# Patient Record
Sex: Female | Born: 1945
Health system: Southern US, Community
[De-identification: ages and names within clinical notes are randomized; demographics above are authoritative.]

## PROBLEM LIST (undated history)

## (undated) DIAGNOSIS — N814 Uterovaginal prolapse, unspecified: Secondary | ICD-10-CM

## (undated) DIAGNOSIS — R011 Cardiac murmur, unspecified: Secondary | ICD-10-CM

## (undated) DIAGNOSIS — E039 Hypothyroidism, unspecified: Secondary | ICD-10-CM

## (undated) DIAGNOSIS — IMO0002 Reserved for concepts with insufficient information to code with codable children: Secondary | ICD-10-CM

## (undated) DIAGNOSIS — J439 Emphysema, unspecified: Secondary | ICD-10-CM

## (undated) DIAGNOSIS — N189 Chronic kidney disease, unspecified: Secondary | ICD-10-CM

## (undated) DIAGNOSIS — I10 Essential (primary) hypertension: Secondary | ICD-10-CM

## (undated) DIAGNOSIS — F329 Major depressive disorder, single episode, unspecified: Secondary | ICD-10-CM

## (undated) DIAGNOSIS — R569 Unspecified convulsions: Secondary | ICD-10-CM

## (undated) DIAGNOSIS — R002 Palpitations: Secondary | ICD-10-CM

## (undated) DIAGNOSIS — D649 Anemia, unspecified: Secondary | ICD-10-CM

## (undated) DIAGNOSIS — Z8679 Personal history of other diseases of the circulatory system: Secondary | ICD-10-CM

## (undated) DIAGNOSIS — K219 Gastro-esophageal reflux disease without esophagitis: Secondary | ICD-10-CM

## (undated) DIAGNOSIS — E785 Hyperlipidemia, unspecified: Secondary | ICD-10-CM

## (undated) DIAGNOSIS — F419 Anxiety disorder, unspecified: Secondary | ICD-10-CM

## (undated) DIAGNOSIS — I219 Acute myocardial infarction, unspecified: Secondary | ICD-10-CM

## (undated) DIAGNOSIS — R51 Headache: Secondary | ICD-10-CM

## (undated) DIAGNOSIS — I2699 Other pulmonary embolism without acute cor pulmonale: Secondary | ICD-10-CM

## (undated) DIAGNOSIS — Z5189 Encounter for other specified aftercare: Secondary | ICD-10-CM

## (undated) DIAGNOSIS — F0390 Unspecified dementia without behavioral disturbance: Secondary | ICD-10-CM

## (undated) DIAGNOSIS — G8929 Other chronic pain: Secondary | ICD-10-CM

## (undated) DIAGNOSIS — F32A Depression, unspecified: Secondary | ICD-10-CM

## (undated) DIAGNOSIS — M1990 Unspecified osteoarthritis, unspecified site: Secondary | ICD-10-CM

## (undated) HISTORY — DX: Unspecified osteoarthritis, unspecified site: M19.90

## (undated) HISTORY — DX: Major depressive disorder, single episode, unspecified: F32.9

## (undated) HISTORY — DX: Anemia, unspecified: D64.9

## (undated) HISTORY — DX: Personal history of other diseases of the circulatory system: Z86.79

## (undated) HISTORY — DX: Other pulmonary embolism without acute cor pulmonale: I26.99

## (undated) HISTORY — DX: Hyperlipidemia, unspecified: E78.5

## (undated) HISTORY — PX: OTHER SURGICAL HISTORY: SHX169

## (undated) HISTORY — DX: Uterovaginal prolapse, unspecified: N81.4

## (undated) HISTORY — PX: TUBAL LIGATION: SHX77

## (undated) HISTORY — DX: Emphysema, unspecified: J43.9

## (undated) HISTORY — DX: Palpitations: R00.2

## (undated) HISTORY — DX: Gastro-esophageal reflux disease without esophagitis: K21.9

## (undated) HISTORY — DX: Cardiac murmur, unspecified: R01.1

## (undated) HISTORY — DX: Depression, unspecified: F32.A

## (undated) HISTORY — DX: Acute myocardial infarction, unspecified: I21.9

## (undated) HISTORY — DX: Anxiety disorder, unspecified: F41.9

## (undated) HISTORY — DX: Hypothyroidism, unspecified: E03.9

## (undated) HISTORY — DX: Reserved for concepts with insufficient information to code with codable children: IMO0002

## (undated) HISTORY — DX: Encounter for other specified aftercare: Z51.89

---

## 2000-10-01 ENCOUNTER — Emergency Department (HOSPITAL_COMMUNITY): Admission: EM | Admit: 2000-10-01 | Discharge: 2000-10-01 | Payer: Self-pay | Admitting: *Deleted

## 2000-10-01 ENCOUNTER — Encounter: Payer: Self-pay | Admitting: *Deleted

## 2001-01-23 ENCOUNTER — Other Ambulatory Visit: Admission: RE | Admit: 2001-01-23 | Discharge: 2001-01-23 | Payer: Self-pay | Admitting: Obstetrics and Gynecology

## 2001-02-04 ENCOUNTER — Ambulatory Visit (HOSPITAL_COMMUNITY): Admission: RE | Admit: 2001-02-04 | Discharge: 2001-02-04 | Payer: Self-pay | Admitting: Internal Medicine

## 2001-02-04 ENCOUNTER — Encounter: Payer: Self-pay | Admitting: Internal Medicine

## 2001-06-05 ENCOUNTER — Ambulatory Visit (HOSPITAL_COMMUNITY): Admission: RE | Admit: 2001-06-05 | Discharge: 2001-06-05 | Payer: Self-pay | Admitting: Internal Medicine

## 2001-09-27 ENCOUNTER — Emergency Department (HOSPITAL_COMMUNITY): Admission: EM | Admit: 2001-09-27 | Discharge: 2001-09-28 | Payer: Self-pay | Admitting: Emergency Medicine

## 2002-03-12 ENCOUNTER — Ambulatory Visit (HOSPITAL_COMMUNITY): Admission: RE | Admit: 2002-03-12 | Discharge: 2002-03-12 | Payer: Self-pay | Admitting: Internal Medicine

## 2002-03-12 ENCOUNTER — Encounter: Payer: Self-pay | Admitting: Internal Medicine

## 2002-04-02 ENCOUNTER — Encounter: Payer: Self-pay | Admitting: Family Medicine

## 2002-04-02 ENCOUNTER — Ambulatory Visit (HOSPITAL_COMMUNITY): Admission: RE | Admit: 2002-04-02 | Discharge: 2002-04-02 | Payer: Self-pay | Admitting: Family Medicine

## 2003-02-04 ENCOUNTER — Ambulatory Visit (HOSPITAL_COMMUNITY): Admission: RE | Admit: 2003-02-04 | Discharge: 2003-02-04 | Payer: Self-pay | Admitting: Internal Medicine

## 2003-02-04 ENCOUNTER — Encounter: Payer: Self-pay | Admitting: Internal Medicine

## 2003-04-02 ENCOUNTER — Emergency Department (HOSPITAL_COMMUNITY): Admission: EM | Admit: 2003-04-02 | Discharge: 2003-04-02 | Payer: Self-pay | Admitting: Internal Medicine

## 2004-03-15 ENCOUNTER — Emergency Department (HOSPITAL_COMMUNITY): Admission: EM | Admit: 2004-03-15 | Discharge: 2004-03-15 | Payer: Self-pay | Admitting: Emergency Medicine

## 2004-05-17 ENCOUNTER — Ambulatory Visit (HOSPITAL_COMMUNITY): Admission: RE | Admit: 2004-05-17 | Discharge: 2004-05-17 | Payer: Self-pay | Admitting: Family Medicine

## 2005-06-10 DIAGNOSIS — IMO0001 Reserved for inherently not codable concepts without codable children: Secondary | ICD-10-CM

## 2005-06-10 DIAGNOSIS — Z8679 Personal history of other diseases of the circulatory system: Secondary | ICD-10-CM

## 2005-06-10 DIAGNOSIS — Z5189 Encounter for other specified aftercare: Secondary | ICD-10-CM

## 2005-06-10 HISTORY — DX: Personal history of other diseases of the circulatory system: Z86.79

## 2005-06-10 HISTORY — DX: Reserved for inherently not codable concepts without codable children: IMO0001

## 2005-06-10 HISTORY — DX: Encounter for other specified aftercare: Z51.89

## 2005-07-23 ENCOUNTER — Inpatient Hospital Stay (HOSPITAL_COMMUNITY): Admission: EM | Admit: 2005-07-23 | Discharge: 2005-07-29 | Payer: Self-pay | Admitting: Emergency Medicine

## 2005-07-24 ENCOUNTER — Ambulatory Visit: Payer: Self-pay | Admitting: *Deleted

## 2005-08-19 ENCOUNTER — Ambulatory Visit (HOSPITAL_COMMUNITY): Admission: RE | Admit: 2005-08-19 | Discharge: 2005-08-19 | Payer: Self-pay | Admitting: Family Medicine

## 2005-09-18 ENCOUNTER — Ambulatory Visit: Payer: Self-pay | Admitting: Internal Medicine

## 2005-09-18 ENCOUNTER — Inpatient Hospital Stay (HOSPITAL_COMMUNITY): Admission: EM | Admit: 2005-09-18 | Discharge: 2005-09-21 | Payer: Self-pay | Admitting: Emergency Medicine

## 2005-09-26 ENCOUNTER — Emergency Department (HOSPITAL_COMMUNITY): Admission: EM | Admit: 2005-09-26 | Discharge: 2005-09-27 | Payer: Self-pay | Admitting: Emergency Medicine

## 2005-10-02 ENCOUNTER — Other Ambulatory Visit: Admission: RE | Admit: 2005-10-02 | Discharge: 2005-10-02 | Payer: Self-pay | Admitting: Family Medicine

## 2005-10-19 ENCOUNTER — Emergency Department (HOSPITAL_COMMUNITY): Admission: EM | Admit: 2005-10-19 | Discharge: 2005-10-19 | Payer: Self-pay | Admitting: Emergency Medicine

## 2005-10-21 ENCOUNTER — Emergency Department (HOSPITAL_COMMUNITY): Admission: EM | Admit: 2005-10-21 | Discharge: 2005-10-21 | Payer: Self-pay | Admitting: Emergency Medicine

## 2005-11-06 ENCOUNTER — Ambulatory Visit: Payer: Self-pay | Admitting: Gastroenterology

## 2005-11-15 ENCOUNTER — Ambulatory Visit: Payer: Self-pay | Admitting: Gastroenterology

## 2006-01-02 ENCOUNTER — Emergency Department (HOSPITAL_COMMUNITY): Admission: EM | Admit: 2006-01-02 | Discharge: 2006-01-02 | Payer: Self-pay | Admitting: Emergency Medicine

## 2006-02-01 ENCOUNTER — Inpatient Hospital Stay (HOSPITAL_COMMUNITY): Admission: EM | Admit: 2006-02-01 | Discharge: 2006-02-04 | Payer: Self-pay | Admitting: Emergency Medicine

## 2006-05-06 ENCOUNTER — Emergency Department (HOSPITAL_COMMUNITY): Admission: EM | Admit: 2006-05-06 | Discharge: 2006-05-07 | Payer: Self-pay | Admitting: Emergency Medicine

## 2006-07-09 ENCOUNTER — Emergency Department (HOSPITAL_COMMUNITY): Admission: EM | Admit: 2006-07-09 | Discharge: 2006-07-09 | Payer: Self-pay | Admitting: Emergency Medicine

## 2006-07-21 ENCOUNTER — Emergency Department (HOSPITAL_COMMUNITY): Admission: EM | Admit: 2006-07-21 | Discharge: 2006-07-22 | Payer: Self-pay | Admitting: Emergency Medicine

## 2006-10-30 ENCOUNTER — Emergency Department (HOSPITAL_COMMUNITY): Admission: EM | Admit: 2006-10-30 | Discharge: 2006-10-30 | Payer: Self-pay | Admitting: Emergency Medicine

## 2006-12-18 ENCOUNTER — Emergency Department (HOSPITAL_COMMUNITY): Admission: EM | Admit: 2006-12-18 | Discharge: 2006-12-18 | Payer: Self-pay | Admitting: Emergency Medicine

## 2006-12-27 ENCOUNTER — Emergency Department (HOSPITAL_COMMUNITY): Admission: EM | Admit: 2006-12-27 | Discharge: 2006-12-27 | Payer: Self-pay | Admitting: Emergency Medicine

## 2007-03-08 ENCOUNTER — Emergency Department (HOSPITAL_COMMUNITY): Admission: EM | Admit: 2007-03-08 | Discharge: 2007-03-08 | Payer: Self-pay | Admitting: Emergency Medicine

## 2007-04-07 ENCOUNTER — Emergency Department (HOSPITAL_COMMUNITY): Admission: EM | Admit: 2007-04-07 | Discharge: 2007-04-07 | Payer: Self-pay | Admitting: Emergency Medicine

## 2008-10-13 ENCOUNTER — Encounter: Payer: Self-pay | Admitting: Cardiology

## 2008-11-19 DIAGNOSIS — J449 Chronic obstructive pulmonary disease, unspecified: Secondary | ICD-10-CM | POA: Insufficient documentation

## 2008-11-19 DIAGNOSIS — E119 Type 2 diabetes mellitus without complications: Secondary | ICD-10-CM

## 2008-11-19 DIAGNOSIS — D649 Anemia, unspecified: Secondary | ICD-10-CM

## 2008-11-19 DIAGNOSIS — Z86711 Personal history of pulmonary embolism: Secondary | ICD-10-CM

## 2008-11-23 ENCOUNTER — Ambulatory Visit: Payer: Self-pay | Admitting: Cardiology

## 2008-11-23 DIAGNOSIS — I10 Essential (primary) hypertension: Secondary | ICD-10-CM | POA: Insufficient documentation

## 2008-11-23 DIAGNOSIS — R072 Precordial pain: Secondary | ICD-10-CM | POA: Insufficient documentation

## 2008-11-23 DIAGNOSIS — E785 Hyperlipidemia, unspecified: Secondary | ICD-10-CM

## 2008-12-01 ENCOUNTER — Telehealth (INDEPENDENT_AMBULATORY_CARE_PROVIDER_SITE_OTHER): Payer: Self-pay

## 2008-12-05 ENCOUNTER — Encounter: Payer: Self-pay | Admitting: Cardiovascular Disease

## 2008-12-05 ENCOUNTER — Ambulatory Visit: Payer: Self-pay

## 2009-09-23 ENCOUNTER — Emergency Department (HOSPITAL_COMMUNITY): Admission: EM | Admit: 2009-09-23 | Discharge: 2009-09-23 | Payer: Self-pay | Admitting: Emergency Medicine

## 2010-07-01 ENCOUNTER — Encounter: Payer: Self-pay | Admitting: Family Medicine

## 2010-07-04 ENCOUNTER — Other Ambulatory Visit (HOSPITAL_COMMUNITY): Payer: Self-pay | Admitting: *Deleted

## 2010-07-11 ENCOUNTER — Ambulatory Visit (HOSPITAL_COMMUNITY)
Admission: RE | Admit: 2010-07-11 | Discharge: 2010-07-11 | Disposition: A | Discharge status: Home / Self Care | Payer: Medicaid Other | Source: Ambulatory Visit | Attending: Family Medicine | Admitting: Family Medicine

## 2010-07-11 DIAGNOSIS — R932 Abnormal findings on diagnostic imaging of liver and biliary tract: Secondary | ICD-10-CM | POA: Insufficient documentation

## 2010-07-11 DIAGNOSIS — R1011 Right upper quadrant pain: Secondary | ICD-10-CM | POA: Insufficient documentation

## 2010-07-15 ENCOUNTER — Encounter: Payer: Self-pay | Admitting: Cardiology

## 2010-07-26 ENCOUNTER — Emergency Department (HOSPITAL_COMMUNITY): Payer: Medicaid Other

## 2010-07-26 ENCOUNTER — Inpatient Hospital Stay (HOSPITAL_COMMUNITY)
Admission: EM | Admit: 2010-07-26 | Discharge: 2010-07-29 | DRG: 313 | Disposition: A | Discharge status: Home / Self Care | Payer: Medicaid Other | Attending: Nephrology | Admitting: Nephrology

## 2010-07-26 DIAGNOSIS — J449 Chronic obstructive pulmonary disease, unspecified: Secondary | ICD-10-CM | POA: Diagnosis present

## 2010-07-26 DIAGNOSIS — Z86718 Personal history of other venous thrombosis and embolism: Secondary | ICD-10-CM

## 2010-07-26 DIAGNOSIS — E669 Obesity, unspecified: Secondary | ICD-10-CM | POA: Diagnosis present

## 2010-07-26 DIAGNOSIS — E039 Hypothyroidism, unspecified: Secondary | ICD-10-CM | POA: Diagnosis present

## 2010-07-26 DIAGNOSIS — K8020 Calculus of gallbladder without cholecystitis without obstruction: Secondary | ICD-10-CM | POA: Diagnosis present

## 2010-07-26 DIAGNOSIS — R0789 Other chest pain: Principal | ICD-10-CM | POA: Diagnosis present

## 2010-07-26 DIAGNOSIS — J4489 Other specified chronic obstructive pulmonary disease: Secondary | ICD-10-CM | POA: Diagnosis present

## 2010-07-26 DIAGNOSIS — I10 Essential (primary) hypertension: Secondary | ICD-10-CM | POA: Diagnosis present

## 2010-07-26 DIAGNOSIS — Z7901 Long term (current) use of anticoagulants: Secondary | ICD-10-CM

## 2010-07-26 DIAGNOSIS — E119 Type 2 diabetes mellitus without complications: Secondary | ICD-10-CM | POA: Diagnosis present

## 2010-07-26 DIAGNOSIS — E785 Hyperlipidemia, unspecified: Secondary | ICD-10-CM | POA: Diagnosis present

## 2010-07-26 LAB — BASIC METABOLIC PANEL
CO2: 28 mEq/L (ref 19–32)
Chloride: 99 mEq/L (ref 96–112)
GFR calc Af Amer: >60 mL/min (ref >60)
Glucose, Bld: 93 mg/dL (ref 70–99)
Sodium: 139 mEq/L (ref 135–145)

## 2010-07-26 LAB — HEPATIC FUNCTION PANEL
Alkaline Phosphatase: 33 U/L — ABNORMAL LOW (ref 39–117)
Total Protein: 7.7 g/dL (ref 6.0–8.3)

## 2010-07-26 LAB — MAGNESIUM: Magnesium: 2 mg/dL (ref 1.5–2.5)

## 2010-07-26 LAB — DIFFERENTIAL
Lymphocytes Relative: 33 % (ref 12–46)
Lymphs Abs: 1.9 10*3/uL (ref 0.7–4.0)
Monocytes Absolute: 0.7 10*3/uL (ref 0.1–1.0)
Monocytes Relative: 12 % (ref 3–12)
Neutro Abs: 2.9 10*3/uL (ref 1.7–7.7)

## 2010-07-26 LAB — CBC
HCT: 39.1 % (ref 36.0–46.0)
Hemoglobin: 12.6 g/dL (ref 12.0–15.0)
MCH: 29.2 pg (ref 26.0–34.0)
MCHC: 32.2 g/dL (ref 30.0–36.0)

## 2010-07-26 LAB — POCT CARDIAC MARKERS
CKMB, poc: 1.3 ng/mL (ref 1.0–8.0)
Myoglobin, poc: 79.7 ng/mL (ref 12–200)
Troponin i, poc: <0.05 ng/mL (ref 0.00–0.09)

## 2010-07-26 LAB — LIPASE, BLOOD: Lipase: 27 U/L (ref 11–59)

## 2010-07-26 LAB — GLUCOSE, CAPILLARY: Glucose-Capillary: 99 mg/dL (ref 70–99)

## 2010-07-26 MED ORDER — IOHEXOL 350 MG/ML SOLN
100.0000 mL | Freq: Once | INTRAVENOUS | Status: AC | PRN
  Administered 2010-07-26: 100 mL via INTRAVENOUS

## 2010-07-27 DIAGNOSIS — R072 Precordial pain: Secondary | ICD-10-CM

## 2010-07-27 DIAGNOSIS — R079 Chest pain, unspecified: Secondary | ICD-10-CM

## 2010-07-27 LAB — LIPASE, BLOOD: Lipase: 29 U/L (ref 11–59)

## 2010-07-27 LAB — COMPREHENSIVE METABOLIC PANEL
ALT: 19 U/L (ref 0–35)
Albumin: 3.4 g/dL — ABNORMAL LOW (ref 3.5–5.2)
Calcium: 9.1 mg/dL (ref 8.4–10.5)
GFR calc Af Amer: 52 mL/min — ABNORMAL LOW (ref >60)
Glucose, Bld: 137 mg/dL — ABNORMAL HIGH (ref 70–99)
Sodium: 140 mEq/L (ref 135–145)
Total Protein: 6.8 g/dL (ref 6.0–8.3)

## 2010-07-27 LAB — LIPID PANEL
Cholesterol: 123 mg/dL (ref 0–200)
HDL: 39 mg/dL — ABNORMAL LOW (ref >39)
LDL Cholesterol: 42 mg/dL (ref 0–99)
Total CHOL/HDL Ratio: 3.2 RATIO

## 2010-07-27 LAB — CARDIAC PANEL(CRET KIN+CKTOT+MB+TROPI)
CK, MB: 1.7 ng/mL (ref 0.3–4.0)
Relative Index: INVALID (ref 0.0–2.5)
Total CK: 48 U/L (ref 7–177)
Total CK: 45 U/L (ref 7–177)
Troponin I: <0.01 ng/mL (ref 0.00–0.06)

## 2010-07-27 LAB — GLUCOSE, CAPILLARY
Glucose-Capillary: 149 mg/dL — ABNORMAL HIGH (ref 70–99)
Glucose-Capillary: 118 mg/dL — ABNORMAL HIGH (ref 70–99)

## 2010-07-27 LAB — DIFFERENTIAL
Basophils Absolute: 0 10*3/uL (ref 0.0–0.1)
Basophils Relative: 1 % (ref 0–1)
Monocytes Relative: 13 % — ABNORMAL HIGH (ref 3–12)
Neutro Abs: 2.1 10*3/uL (ref 1.7–7.7)
Neutrophils Relative %: 45 % (ref 43–77)

## 2010-07-27 LAB — CBC
Hemoglobin: 12.3 g/dL (ref 12.0–15.0)
MCH: 29.7 pg (ref 26.0–34.0)
RBC: 4.14 MIL/uL (ref 3.87–5.11)

## 2010-07-28 LAB — COMPREHENSIVE METABOLIC PANEL
Albumin: 3.5 g/dL (ref 3.5–5.2)
Alkaline Phosphatase: 33 U/L — ABNORMAL LOW (ref 39–117)
BUN: 27 mg/dL — ABNORMAL HIGH (ref 6–23)
Chloride: 101 mEq/L (ref 96–112)
Glucose, Bld: 147 mg/dL — ABNORMAL HIGH (ref 70–99)
Potassium: 4.1 mEq/L (ref 3.5–5.1)
Total Bilirubin: 0.3 mg/dL (ref 0.3–1.2)

## 2010-07-28 LAB — GLUCOSE, CAPILLARY
Glucose-Capillary: 130 mg/dL — ABNORMAL HIGH (ref 70–99)
Glucose-Capillary: 109 mg/dL — ABNORMAL HIGH (ref 70–99)

## 2010-07-29 LAB — PROTIME-INR
INR: 2.21 — ABNORMAL HIGH (ref 0.00–1.49)
Prothrombin Time: 24.7 seconds — ABNORMAL HIGH (ref 11.6–15.2)

## 2010-07-29 LAB — DIFFERENTIAL
Basophils Relative: 1 % (ref 0–1)
Lymphs Abs: 2.4 10*3/uL (ref 0.7–4.0)
Monocytes Absolute: 0.5 10*3/uL (ref 0.1–1.0)
Monocytes Relative: 9 % (ref 3–12)
Neutro Abs: 2.6 10*3/uL (ref 1.7–7.7)
Neutrophils Relative %: 46 % (ref 43–77)

## 2010-07-29 LAB — CBC
HCT: 38.5 % (ref 36.0–46.0)
Hemoglobin: 12.1 g/dL (ref 12.0–15.0)
MCH: 28.6 pg (ref 26.0–34.0)
MCHC: 31.4 g/dL (ref 30.0–36.0)
MCV: 91 fL (ref 78.0–100.0)
RBC: 4.23 MIL/uL (ref 3.87–5.11)

## 2010-07-29 LAB — GLUCOSE, CAPILLARY: Glucose-Capillary: 118 mg/dL — ABNORMAL HIGH (ref 70–99)

## 2010-08-01 NOTE — Consult Note (Signed)
NAMELINLEY, MOSKAL              ACCOUNT NO.:  [id number]  MEDICAL RECORD NO.:  [id number]           PATIENT TYPE:  LOCATION:                                 FACILITY:  PHYSICIAN:  Jonelle Sidle, MD DATE OF BIRTH:  [birth year]  DATE OF CONSULTATION: DATE OF DISCHARGE:                                CONSULTATION   REQUESTING SERVICE:  [facility].  PRIMARY CARDIOLOGIST:  Rollene Rotunda, MD, [facility]  PRIMARY CARE PHYSICIAN:  Katherine Roan, MD, with [facility].  REASON FOR CONSULTATION:  Chest pain.  HISTORY OF PRESENT ILLNESS:  [name] is a 65 year old woman with a history of type 2 diabetes mellitus, hypertension, hypothyroidism, pulmonary emboli with syncope diagnosed in 2007, and right ventricular dysfunction with moderate pulmonary hypertension diagnosed in 2007 as well.  She is maintained on chronic Coumadin, and based on available information has no clearly documented history of obstructive coronary artery disease or left ventricular dysfunction.  She gives a verbal history of some type of "heart condition" diagnosed back in her 47s and there is description in the chart regarding a "cardiomyopathy," however, this is not clearly borne out based on objective testing available to me in the office records.  She was seen by Dr. Antoine Poche back in June 2010, also with a history of chest pain, and was referred for a followup Myoview.  She underwent an adenosine Myoview in June 2010 through our [facility] practice, revealing a left ventricular ejection fraction of 83% with no evidence of ischemia.  She has not had cardiology followup since that time.  My understanding is that [name] was recently seen by Dr. Dwain Sarna for consideration of elective gallbladder surgery, and in fact she has a followup cardiology visit scheduled with Dr. Eden Emms in our [facility] office on August 08, 2010, for preoperative cardiac evaluation.  [name] now presents to [facility] describing recent onset of chest pain.  She states that yesterday in the early morning hours she woke complaining initially of a band-like sensation around her epigastric area, essentially at the level of her lower costal margin, beginning on the left and radiating around to the back.  Following this, she began to feel a sensation of heaviness principally on the left side of her chest, waxing and waning, and intense at times.  She felt somewhat short of breath, denied any cough or nausea, and experienced some feeling of lightheadedness.  She presented to the [facility] Emergency Department via EMS and was subsequently admitted to the Hospitalist Service.  Review of laboratory data finds that her INR level was subtherapeutic at presentation, 1.3, and so far cardiac markers have been reassuring with troponin I levels less than 0.05.  She did undergo a CT angiogram of the chest on July 26, 2010, which excluded pulmonary embolus, also showed normal thoracic aorta with no acute pulmonary findings.  No pericardial effusion was noted.  Heart size was described as normal.  She has maintained sinus rhythm by telemetry and ECGs show nonspecific ST-T changes, principally in the inferior and anterolateral leads.  She denies any recurrent chest pain this  morning.  ALLERGIES:  Listed as: 1. LATEX. 2. DETROL. 3. DARVOCET. 4. CODEINE. 5. NOVOCAINE. 6. PENICILLIN.  MEDICATIONS AT HOME: 1. Amitriptyline 25 mg p.o. nightly. 2. Diazepam 10 mg p.o. t.i.d. p.r.n. 3. Lasix 40 mg p.o. daily. 4. Levoxyl 50 mcg p.o. daily. 5. Lexapro 20 mg p.o. daily. 6. Metformin 1000 mg p.o. q.a.m. and 500 mg p.o. q.p.m. 7. Toprol-XL 25 mg p.o. daily. 8. Potassium chloride 20 mEq p.o. daily. 9. Simvastatin 40 mg p.o. daily. 10.Tramadol 50 mg p.o. t.i.d. p.r.n. 11.Trilipix 135 mg p.o. daily. 12.Coumadin 3 mg on Fridays and Sundays with 2 mg the rest of the      week. 13.Multivitamin 1 p.o. daily. 14.Omega-3 supplements. 15.Vitamin B6 and B12 supplements.  PAST MEDICAL HISTORY:  Discussed above.  Additional problems include: 1. COPD. 2. Osteoarthritis. 3. Esophageal dilatation. 4. Previous tubal ligation. 5. Cholelithiasis. 6. Gastroesophageal reflux disease. 7. Degenerative joint disease. 8. Uterine and bladder prolapse. 9. Anxiety and depression.  SOCIAL HISTORY:  The patient is married, lives in [place].  States that she quit smoking approximately 5 years ago.  No active alcohol use.  FAMILY HISTORY:  Reviewed and significant for prostate cancer in the patient's father, liver cancer in the patient's brother.  Also history of Alzheimer dementia.  REVIEW OF SYSTEMS:  As detailed above.  The patient describes being generally weak and fatigued, she has intermittent chest discomfort, seems to be fairly sporadic, noted over the last several months.  She uses oxygen as needed for shortness of breath with activity.  No palpitations or syncope.  No cough or hemoptysis.  No fevers or chills. Reports stable appetite.  No melena or hematochezia.  No nausea or emesis.  Otherwise reviewed and negative. PHYSICAL EXAMINATION:  VITAL SIGNS:  Temperature is 97.4 degrees, heart rate 56, in sinus bradycardia, respirations 18, blood pressure 112/70, and oxygen saturation is 97% on 2 liters nasal cannula, and weight is 81 kg. GENERAL:  This is an overweight, chronically ill-appearing woman in no acute distress without active chest pain. HEENT:  Conjunctivae and lids are normal, oropharynx shows that the patient is edentulous. NECK:  Supple.  No elevated JVP or carotid bruits.  No thyromegaly. LUNGS:  Diminished breath sounds throughout.  No active wheezing or labored breathing. CARDIAC:  Regular rate and rhythm.  No S3 gallop or pericardial rub.  No diastolic murmur.  Very soft systolic murmur at the base.  Preserved second heart sound. ABDOMEN:   Soft and nontender.  No guarding or rebound.  No obvious Murphy sign.  Bowel sounds present. EXTREMITIES:  No significant pitting edema.  There is trace ankle edema. Distal pulses 1+. SKIN:  Warm and dry. MUSCULOSKELETAL:  No kyphosis is noted. NEUROPSYCHIATRIC:  The patient is alert and oriented x3.  Affect somewhat flat, but generally appropriate.  LABORATORY DATA:  WBCs 4.7, hemoglobin 12.3, hematocrit 37.9, and platelets 279.  INR 1.3.  Sodium 140, potassium 3.6, chloride 99, bicarb 32, glucose 137, BUN 23, creatinine 1.2, AST 20, ALT 19, albumin 3.4, lipase 29.  CK-MB 1.3 and troponin I less than 0.05.  IMPRESSION: 1. Chest pain syndrome, also associated with epigastric discomfort,     noted in the setting of multiple cardiac risk factors, however,     without any clear evidence of prior diagnosis of obstructive     coronary artery disease.  Cardiac markers are so far reassuring,     and ECG is abnormal, however, nonspecific overall.  Previous     adenosine Myoview  from June 2010, done through our [facility]     practice, was normal indicating both normal left ventricular ejection     fraction and no evidence of ischemia.  She has not had followup     since that time. 2. Reported diagnosis of cholelithiasis, recently assessed by Dr.     Dwain Sarna, with plans for elective cholecystectomy.  The patient is     actually scheduled to see Dr. Eden Emms in our [facility] office for a     preoperative cardiac assessment on August 08, 2010. 3. Type 2 diabetes mellitus. 4. Obesity. 5. Hypertension. 6. Hypothyroidism. 7. Hyperlipidemia. 8. History of pulmonary emboli in 2007, the patient continues on     Coumadin.  INR subtherapeutic at presentation, however, CT     angiogram of the chest shows no evidence of acute pulmonary     embolus. 9. Chronic obstructive pulmonary disease, prior history of tobacco     use, oxygen use p.r.n. at home.  RECOMMENDATIONS:  We would cycle full set of  cardiac markers and observe for clinical stability.  A 2-D echocardiogram will be obtained to reassess left and right ventricular systolic function.  If she remains clinically stable, and echocardiogram is reassuring, as well as followup cardiac markers, it is likely that she could be scheduled for an outpatient Lexiscan Myoview for followup ischemic surveillance and in light of her pending preoperative consultation with Dr. Eden Emms on August 08, 2010.  Otherwise, continue medical therapy including Coumadin for the time being.     Jonelle Sidle, MD     SGM/MEDQ  D:  07/27/2010  T:  07/27/2010  Job:  [id number]  cc:   Rollene Rotunda, MD, [facility] [address]  [address] [place] [address]  S. Kyra Manges, M.D. Fax: [phone]  Electronically Signed by Monica Dell MD on 08/01/2010 12:45:11 PM

## 2010-08-04 NOTE — Discharge Summary (Signed)
[name], [name]              ACCOUNT NO.:  [id number]  MEDICAL RECORD NO.:  [id number]           PATIENT TYPE:  I  LOCATION:  A327                          FACILITY:  [facility]  PHYSICIAN:  Celso Amy, MD   DATE OF BIRTH:  1945-08-10  DATE OF ADMISSION:  07/26/2010 DATE OF DISCHARGE:  02/19/2012LH                              DISCHARGE SUMMARY   PRIMARY CARE DOCTOR:  [facility] Dr. Elana Alm.  Cardiology consultation was obtained.  RADIOLOGICAL PROCEDURES DONE:  The patient had CT angio which was negative for PE.  ADMITTING DIAGNOSES: 1. Chest pain, etiology unclear. 2. Diabetes mellitus type 2. 3. Obesity. 4. Hypertension. 5. Hypothyroidism. 6. History of PE in 2007, still on Coumadin. 7. History of cardiomegaly.  HOSPITAL COURSE AND TREATMENT: 1. Chest pain.  The patient was admitted with chest pain etiology was     coronary artery disease versus esophageal dysmotility versus GERD.     The patient was admitted and the patient's troponins were negative     x3.  The patient's MI was ruled out,  [facility] was     consulted, and they recommended to have stress test as outpatient.     Echo was done on July 27, 2010, which showed that the patient's     ejection fraction of 55-60%, and there was no left ventricular     hypertrophy. 2. PE.  The patient had a history of PE, and the patient had another     CT angio done which ruled out PE.  The patient has been on Coumadin     since 2007.  The etiology of PE in 2007 is unprovoked.  The patient     was not having any surgical procedure or long travel in 2007.  It     was discussed about the risk and benefit of Coumadin of 5 years     after PE.  The patient wanted to discuss with her primary care for     the same. 3. Diabetes mellitus type 2.  The patient was continued on insulin.     The patient remained stable. 4. GI disease.  There was also a possibility that this chest pain     could  be from arterial gases, but the patient chest pain resolved     by the next day, and the patient is following up for elective     cholecystectomy. 5. Hypertension.  The patient's blood pressure was at goal during the     hospital stay. 6. Hypothyroidism.  The patient was continued on her outpatient     medication. 7. Hyperlipidemia.  The patient was continued on her outpatient     medications.  Discharge medications are: 1. Amitriptyline 25 mg 1 tablet p.o. nightly. 2. Coumadin 3 mg p.o. daily. 3. Diazepam 10 mg p.o. t.i.d. p.r.n. 4. Fish oil one capsule p.o. daily. 5. Lasix 40 mg p.o. daily. 6. Levoxyl 50 mcg p.o. daily. 7. Lexapro 20 mg p.o. daily. 8. Metformin 500 mg p.o. t.i.d. 9. Metoprolol 25 mg p.o. daily. 10.Multivitamin 1 tablet p.o. daily. 11.KCl 20 mEq p.o. daily. 12.Simvastatin 40  mg p.o. daily. 13.Tramadol 50 mg p.o. t.i.d. p.r.n. 14.Fenofibric 135 mg p.o. daily. 15.Vitamin B12 one tablet p.o. daily. 16.Vitamin B6 one tablet p.o. daily.  FOLLOWUP:  The patient is to follow up with primary care in 1 week.  The patient is to follow up with the Cardiology.  The patient already has appointment for August 08, 2010.  DIET:  The patient is to follow heart healthy diet.     Celso Amy, MD     MB/MEDQ  D:  07/29/2010  T:  07/29/2010  Job:  [id number]  Electronically Signed by Celso Amy M.D. on 08/04/2010 02:16:12 PM

## 2010-08-08 ENCOUNTER — Encounter: Payer: Self-pay | Admitting: Cardiovascular Disease

## 2010-08-08 ENCOUNTER — Encounter: Payer: Self-pay | Admitting: Cardiology

## 2010-08-08 ENCOUNTER — Institutional Professional Consult (permissible substitution) (INDEPENDENT_AMBULATORY_CARE_PROVIDER_SITE_OTHER): Payer: Medicaid Other | Admitting: Cardiovascular Disease

## 2010-08-08 ENCOUNTER — Ambulatory Visit (HOSPITAL_COMMUNITY): Payer: Medicaid Other | Attending: Cardiovascular Disease

## 2010-08-08 DIAGNOSIS — R072 Precordial pain: Secondary | ICD-10-CM

## 2010-08-08 DIAGNOSIS — R0602 Shortness of breath: Secondary | ICD-10-CM

## 2010-08-08 DIAGNOSIS — I2510 Atherosclerotic heart disease of native coronary artery without angina pectoris: Secondary | ICD-10-CM

## 2010-08-08 DIAGNOSIS — Z01810 Encounter for preprocedural cardiovascular examination: Secondary | ICD-10-CM | POA: Insufficient documentation

## 2010-08-08 DIAGNOSIS — I2699 Other pulmonary embolism without acute cor pulmonale: Secondary | ICD-10-CM

## 2010-08-08 DIAGNOSIS — R079 Chest pain, unspecified: Secondary | ICD-10-CM

## 2010-08-08 DIAGNOSIS — E785 Hyperlipidemia, unspecified: Secondary | ICD-10-CM

## 2010-08-08 DIAGNOSIS — E119 Type 2 diabetes mellitus without complications: Secondary | ICD-10-CM

## 2010-08-09 ENCOUNTER — Telehealth (INDEPENDENT_AMBULATORY_CARE_PROVIDER_SITE_OTHER): Payer: Self-pay | Admitting: *Deleted

## 2010-08-09 ENCOUNTER — Encounter: Payer: Self-pay | Admitting: Internal Medicine

## 2010-08-13 ENCOUNTER — Other Ambulatory Visit (HOSPITAL_COMMUNITY): Payer: Medicaid Other

## 2010-08-16 NOTE — Assessment & Plan Note (Signed)
Summary: cardiac eval. daily chestpain. h/o blood clot. pt on coumadin...   Primary Provider:  [facility]   History of Present Illness: Asked to see 65 yo patient of Dr Antoine Poche for preop clearance.  Long standing history of atypical pains with normal myovue in 2010.  Just D/C on 2/20 from [facility] for atypical pain.  R/O had normal echo and Dr Raynelle Chary felt outpatient myovue could be done.  Has apparantly recurrent GB pain and needs cholycystectomy with Dr Renaldo Fiddler.  I have no idea why she is still on coumadin for PE 6-7 years ago.  No bleeding problems and this is followed at [facility] I told her to stop it and start ASA and notify Dr Buel Ream office.  She quit smoking 6 years ago.  She is otherwise low periop risk.  She will have a myovue and be cleared for surgery if normal as it was in 2010.  as needed F/U can be with Dr Antoine Poche in [place].  Current Problems (verified): 1)  Essential Hypertension, Benign  (ICD-401.1) 2)  Hyperlipidemia  (ICD-272.4) 3)  Chest Pain, Precordial  (ICD-786.51) 4)  Diabetes Mellitus  (ICD-250.00) 5)  COPD  (ICD-496) 6)  Anemia  (ICD-285.9) 7)  Pe  (ICD-415.19)  Current Medications (verified): 1)  Klor-Con 20 Meq Pack (Potassium Chloride) .... Daily 2)  Simvastatin 40 Mg Tabs (Simvastatin) .... Take One Tablet By Mouth Daily At Bedtime 3)  Tramadol Hcl 50 Mg Tabs (Tramadol Hcl) .... As Needed 4)  Warfarin .... As Directed 5)  Lexapro 20 Mg Tabs (Escitalopram Oxalate) .... Daily 6)  Trilipix 135 Mg Cpdr (Choline Fenofibrate) .... Daily 7)  Metformin Hcl 500 Mg Tabs (Metformin Hcl) .... Two Times A Day 8)  Amitriptyline Hcl 25 Mg Tabs (Amitriptyline Hcl) .... Daily 9)  Diazepam 10 Mg Tabs (Diazepam) .... Daily 10)  Metoprolol Succinate 25 Mg Xr24h-Tab (Metoprolol Succinate) .... Daily 11)  Furosemide 40 Mg Tabs (Furosemide) .... Daily 12)  Levothroid 50 Mcg Tabs (Levothyroxine Sodium) .Marland Kitchen.. 1 Tab By Mouth Once Daily  Allergies (verified): 1)  !  Penicillin 2)  ! * Novacaine 3)  ! * Oxycodone 4)  ! Codeine  Past History:  Past Medical History: Last updated: 11/23/2008  1. Pulmonary embolus.  2. Anemia.  3. COPD.  4. Depression and anxiety.  5. Home O2.  6. Diet-controlled diabetes.  7. Gastroesophageal reflux disease.  8. Osteoporosis  9. Degenerative disc disease 10. Uterine and bladder prolapse  Past Surgical History: Last updated: 11/23/2008 Tubal ligation  Family History: Last updated: 11/19/2008   Brother had liver cancer.  Father had prostate cancer.  There is a family history of Alzheimer disease.  Social History: Last updated: 11/19/2008  The patient is married.  She is disabled.  She quit smoking  in December 2006.  Prior to that, she had a 50-year pack smoking history.  She denies alcohol or IV drug abuse.  Review of Systems       Denies fever, malais, weight loss, blurry vision, decreased visual acuity, cough, sputum, SOB, hemoptysis, pleuritic pain, palpitaitons, heartburn, abdominal pain, melena, lower extremity edema, claudication, or rash.   Physical Exam  General:  Affect appropriate Healthy:  appears stated age HEENT: normal Neck supple with no adenopathy JVP normal no bruits no thyromegaly Lungs clear with no wheezing and good diaphragmatic motion Heart:  S1/S2 no murmur,rub, gallop or click PMI normal Abdomen: benighn, BS positve, no tenderness, no AAA no bruit.  No HSM or HJR Distal pulses  intact with no bruits No edema Neuro non-focal Skin warm and dry    Impression & Recommendations:  Problem # 1:  CHEST PAIN, PRECORDIAL (ICD-786.51) Atypical  Lexiscan myovue for preop clearance Her updated medication list for this problem includes:    Metoprolol Succinate 25 Mg Xr24h-tab (Metoprolol succinate) .Marland Kitchen... Daily  Problem # 2:  ESSENTIAL HYPERTENSION, BENIGN (ICD-401.1)  Well controlled Her updated medication list for this problem includes:    Metoprolol Succinate 25 Mg  Xr24h-tab (Metoprolol succinate) .Marland Kitchen... Daily    Furosemide 40 Mg Tabs (Furosemide) .Marland Kitchen... Daily  Her updated medication list for this problem includes:    Metoprolol Succinate 25 Mg Xr24h-tab (Metoprolol succinate) .Marland Kitchen... Daily    Furosemide 40 Mg Tabs (Furosemide) .Marland Kitchen... Daily  Problem # 3:  PE (ICD-415.19) No indication for continuing anticoagulaiton this long.  Stop coumadin notify Dr Vincente Poli and start taking ASA  Problem # 4:  HYPERLIPIDEMIA (ICD-272.4) Cotninue statin labs per primary Her updated medication list for this problem includes:    Simvastatin 40 Mg Tabs (Simvastatin) .Marland Kitchen... Take one tablet by mouth daily at bedtime    Trilipix 135 Mg Cpdr (Choline fenofibrate) .Marland Kitchen... Daily  Other Orders: Nuclear Stress Test (Nuc Stress Test)  Patient Instructions: 1)  Your physician recommends that you schedule a follow-up appointment as needed with Dr. Antoine Poche. 2)  Your physician recommends that you continue on your current medications as directed. Please refer to the Current Medication list given to you today. STOP COUMADIN. 3)  Your physician has requested that you have an lexiscan myoview.  For further information please visit [email or web address]  Please follow instruction sheet, as given.

## 2010-08-16 NOTE — Progress Notes (Signed)
Summary: Records Request  Faxed OV & Stress to [place] [facility] ([phone]). Debby Freiberg  August 09, 2010 3:53 PM

## 2010-08-16 NOTE — Assessment & Plan Note (Addendum)
Summary: Cardiology Nuclear Testing  Nuclear Med Background Indications for Stress Test: Evaluation for Ischemia, Surgical Clearance, Post Hospital  Indications Comments: Pending Gallbladder surgery- 08/10/10- Moishe Spice 07/30/10- D/C'd from [facility]- Chest Pain( Negative enzymes)  History: Asthma, COPD, Echo, Emphysema, Myocardial Perfusion Study  History Comments: 6/10- Normal MPS. EF= 83% 07/27/10- Echo- EF= 55-60%   Symptoms: Chest Pain, Dizziness, DOE, Fatigue, Light-Headedness, Nausea, Rapid HR, SOB    Nuclear Pre-Procedure Cardiac Risk Factors: History of Smoking, Hypertension, Lipids, NIDDM, Overweight, PVD Caffeine/Decaff Intake: none NPO After: 11:00 AM Lungs: clear IV 0.9% NS with Angio Cath: 20g     IV Site: L Antecubital IV Started by: Milana Na, EMT-P Chest Size (in) 36     Cup Size C     Height (in): 65 Weight (lb): 177 BMI: 29.56 Tech Comments: CBG 116mg /dl  Nuclear Med Study 1 or 2 day study:  1 day     Stress Test Type:  Eugenie Birks Reading MD:  Marca Ancona, MD     Referring MD:  Rollene Rotunda, MD/ Burna Cash Resting Radionuclide:  Technetium 88m Tetrofosmin     Resting Radionuclide Dose:  11.0 mCi  Stress Radionuclide:  Technetium 26m Tetrofosmin     Stress Radionuclide Dose:  33.0 mCi   Stress Protocol  Max Systolic BP: 122 mm Hg Lexiscan: 0.4 mg   Stress Test Technologist:  Milana Na, EMT-P     Nuclear Technologist:  Domenic Polite, CNMT  Rest Procedure  Myocardial perfusion imaging was performed at rest 45 minutes following the intravenous administration of Technetium 57m Tetrofosmin.  Stress Procedure  The patient received IV Lexiscan 0.4 mg over 15-seconds.  Technetium 37m Tetrofosmin injected at 30-seconds.  There were no significant changes with infusion.  Quantitative spect images were obtained after a 45 minute delay.  QPS Raw Data Images:  Normal; no motion artifact; normal heart/lung ratio. Stress Images:   Small apical perfusion defect.  Rest Images:  Small apical perfusion defect.  Subtraction (SDS):  Small fixed apical perfusion defect.  Transient Ischemic Dilatation:  .90  (Normal <1.22)  Lung/Heart Ratio:  .36  (Normal <0.45)  Quantitative Gated Spect Images QGS EDV:  53 ml QGS ESV:  10 ml QGS EF:  81 % QGS cine images:  Normal wall motion   Overall Impression  Exercise Capacity: Lexiscan with no exercise. BP Response: Normal blood pressure response. Clinical Symptoms: short of breath ECG Impression: No significant ST segment change suggestive of ischemia. Overall Impression: Small fixed apical perfusion defect with normal wall motion.  Most likely soft tissue attenuation.  No ischemia.   Appended Document: Cardiology Nuclear Testing The stress test was negative.  She is at acceptable risk for the planned procedure.  Appended Document: Cardiology Nuclear Testing pt aware of results and that she has been cleared for surgery.  Appended Document: Cardiology Nuclear Testing [facility] for surgery.  F/U with Hochrein

## 2010-08-16 NOTE — Letter (Signed)
Summary: [facility] Referral Form  [facility] Referral Form   Imported By: Marylou Mccoy 08/09/2010 15:43:11  _____________________________________________________________________  External Attachment:    Type:   Image     Comment:   External Document

## 2010-08-21 NOTE — Consult Note (Signed)
Summary: Consultation Report  Consultation Report   Imported By: Earl Many 08/13/2010 17:18:52  _____________________________________________________________________  External Attachment:    Type:   Image     Comment:   External Document

## 2010-08-22 ENCOUNTER — Other Ambulatory Visit (HOSPITAL_COMMUNITY): Payer: Self-pay | Admitting: General Surgery

## 2010-08-23 ENCOUNTER — Other Ambulatory Visit (HOSPITAL_COMMUNITY): Payer: Self-pay | Admitting: Orthopaedic Surgery

## 2010-08-26 NOTE — H&P (Signed)
[name], [name]              ACCOUNT NO.:  [id number]  MEDICAL RECORD NO.:  [id number]           PATIENT TYPE:  I  LOCATION:  A210                          FACILITY:  [facility]  PHYSICIAN:  Osvaldo Shipper, MD     DATE OF BIRTH:  September 06, 1945  DATE OF ADMISSION:  07/26/2010 DATE OF DISCHARGE:  LH                             HISTORY & PHYSICAL   PRIMARY CARE PHYSICIAN:  [facility], Dr. Elana Alm.  She has been seen by [facility] in the past and apparently has an appointment end of this month.  She was recently seen by Dr. Dwain Sarna to consider having a cholecystectomy.  However, he wanted a cardiology clearance before attempting the cholecystectomy.  ADMISSION DIAGNOSES: 1. Chest pain, etiology unclear. 2. Diabetes type 2. 3. Obesity. 4. Hypertension. 5. Hypothyroidism. 6. History of pulmonary embolism, still on Coumadin. 7. History of cardiomegaly.  CHIEF COMPLAINT:  Chest pain since this morning.  HISTORY OF PRESENT ILLNESS:  The patient is a 65 year old Caucasian female, who is obese, who has diabetes and hypertension, who was in a usual state of health until about 1 a.m. this morning when she woke up with left-sided chest pain.  She could not sleep all night.  She was tossing and turning.  The pain was on and off.  Occasionally feels like stabbing pain, occasionally like tightness.  Then, this morning she had severe pain which was 10/10 in intensity which was both sides.  The pain did not really radiate anywhere except to the right side of the chest. This pain occurred when she was lying down.  The patient has had a dry cough for a while.  She also felt short of breath with this episode. She thinks she may have had a fever last night, but she is not sure. Denies any sick contacts.  She does not get any leg swelling.  Did have some nausea without any emesis.  Did feel lightheaded, but did not have any syncopal episode.  Did feel a little bit  confused as well this morning.  She said she has had dyspnea on exertion for more than 10 years.  She was given a call later this morning by, I guess, her pharmacist regarding the INR in order to adjust her Coumadin dosage. When she mentioned the chest pain to this individual, he advised her to call EMS.  She called 911 and she was brought into the hospital.  She was given nitroglycerin with which she had relief of pain.  Nitro paste was put on her and now she has 0/10 pain.  She tells me that she has had a stress test about 3 years ago.  There is no record of this stress test in our computer system.  She was seen by [facility] in June 2010, and actually was seen by Dr. Antoine Poche.  MEDICATIONS AT HOME: 1. Amitriptyline 25 mg nightly. 2. Diazepam 10 mg t.i.d. p.r.n. anxiety. 3. Lasix 40 mg daily. 4. Levoxyl 50 mcg daily. 5. Lexapro 20 mg daily. 6. Metformin 1000 mg in the morning and 500 in the evening. 7. Metoprolol ER 25 mg once  a day. 8. Potassium chloride 20 mEq daily. 9. Simvastatin 40 mg daily. 10.Tramadol 50 mg 3 times a day as needed for arthritic pain. 11.Trilipix 135 mg daily. 12.Coumadin 3 mg on Friday and Sunday, 2 mg rest of the week.  She uses oxygen as needed.  She is on multivitamins with fish oil and also on B12 and B6 vitamins.  ALLERGIES:  CODEINE, DARVOCET, DETROL, NOVOCAINE, and PENICILLIN.  PAST MEDICAL HISTORY:  Positive for cardiomegaly since the age of 65 when she had her last child, diabetes, emphysema, dyslipidemia, hypertension, hypothyroidism, and osteoarthritis.  She was diagnosed with a PE, she says about 5 years ago, actually she was back in 2007,this looks like it was diagnosed here.  She at that time had an echocardiogram which showed no wall motion abnormality.  No EF is mentioned.  The patient was discharged on warfarin and since then, she has been on warfarin.  Denies any recurrent episodes of PE.  No other blood clots, so it is  unclear why she is still on Coumadin 65 years later.  She has diabetes, hypertension, and dyslipidemia as I mentioned. She has had tubal ligation.  She had esophageal dilatation.  She has had a colonoscopy as well.  Recently, diagnosed with possible gallstones or gallbladder polyps for which she was referred to [facility] and was seen by Dr. Dwain Sarna.  SOCIAL HISTORY:  Lives in [place] with her husband.  Quit smoking 5 years ago.  No alcohol use.  No illicit drug use.  Usually independent with her daily activities, however, does get dyspnea on exertion at times.  FAMILY HISTORY:  Positive for bone cancer, bladder cancer, and cardiomegaly.  REVIEW OF SYSTEMS:  GENERAL:  Positive for weakness and malaise.  HEENT: Unremarkable.  CARDIOVASCULAR:  As in HPI.  RESPIRATORY:  As in HPI. GI:  Unremarkable.  GU:  Unremarkable.  NEUROLOGICAL:  Unremarkable. PSYCHIATRIC:  Unremarkable.  DERMATOLOGICAL:  Unremarkable.  Other systems reviewed and found to be negative.  PHYSICAL EXAMINATION:  VITAL SIGNS:  Temperature 97.9; heart rate in the 60s, occasionally in the 50s; respiratory rate is 16; blood pressure 127/66; and saturation 98% on 2 L. GENERAL:  An obese white female, in no distress. HEENT:  Head is normocephalic and atraumatic.  Pupils are equal and reacting.  No pallor, no icterus.  Oral mucous membranes moist.  No oral lesions are noted. NECK:  Soft and supple.  No thyromegaly appreciated.  No cervical, supraclavicular, or inguinal lymphadenopathy is present. LUNGS:  A few crackles at bilateral bases.  No wheezing is present. CARDIOVASCULAR:  S1 and S2 is normal and regular.  No S3, S4, rubs, murmurs, or bruits.  No pedal edema.  Peripheral pulses are palpable. No JVD is noted. ABDOMEN:  Soft.  Minimal tenderness in the right upper quadrant is present without any rebound, rigidity, or guarding.  No masses or organomegaly is appreciated. GU:   Deferred. MUSCULOSKELETAL:  Normal muscle mass and tone. NEUROLOGICAL:  She is alert and oriented x3.  No focal neurological deficits are present. SKIN:  Does not reveal any rashes.  LABORATORY DATA:  CBC is unremarkable.  INR is 1.3.  Electrolytes are normal.  BUN is 24, creatinine is 1.09.  Cardiac enzymes negative x2. She had a chest x-ray which showed stable mild cardiomegaly with left basilar scarring and right calcified granuloma.  She had a CT angio subsequently which did not show any PE, did show normal thoracic aorta. She has had EKG done as well  and this shows sinus bradycardia at 60 with normal axis, intervals appear to be in the normal range.  No definite Q- waves.  No concerning ST-segment changes.  There are nonspecific T-wave changes especially in lead V1, V2, V3, and possibly even V4.  This was compared to an EKG from 2008, and these changes were present even during that time.  ASSESSMENT:  This is a 65 year old Caucasian female who presents with chest pain.  She has multiple risk factors for coronary artery disease. She tells me she has had a stress test within the last 3 years, still I am unable to see one in our system.  She was recently diagnosed with a possible cholelithiasis, although it could have been a gallbladder polypas well.  The differential diagnosis of the chest pain include coronary artery disease, gastrointestinal etiology, biliary etiology, esophageal dysmotility etc.  PLAN: 1. Chest pain.  We will observe her in the hospital and rule out for     acute coronary syndrome.  Since the pain got relieved with     nitroglycerin, CAD and esophageal dysmotility is in the top of the     list.  Nitroglycerin will be continued.  Aspirin will be given.     EKG will be repeated.  Lipid panel will be checked.  We will go     ahead and consult [facility] as well to see if they want to     consider any testing on this individual.  We will go ahead and      check LFTs and lipase level as well. 2. Diabetes.  Sliding scale insulin will be given, metformin will be     held because she will receive contrast. 3. Hypertension.  Continue with metoprolol. 4. History of dyslipidemia.  Continue with Trilipix and simvastatin.     Lipid panel will be checked in the morning. 5. History of PE.  We will continue Coumadin for now with bridging     therapy with Lovenox.  However, I think consideration should be     given to stopping the Coumadin.  The patient tells me that her PCP     did allude to this during the last visit and I would encourage that     Coumadin be switched over to aspirin if there is no other history     of any thromboembolic events since the one in 2007. 6. History of hypothyroidism.  Continue with Levoxyl. 7. DVT prophylaxis as she is already on Coumadin.  She is a full code.  Further management decisions will depend on results of further testing and patient's response to treatment.  Osvaldo Shipper, MD     GK/MEDQ  D:  07/26/2010  T:  07/26/2010  Job:  [id number]  cc:   [facility]  [facility]  Juanetta Gosling, MD [address] [place] [address]  Electronically Signed by Osvaldo Shipper MD on 08/26/2010 06:37:50 AM

## 2010-08-27 ENCOUNTER — Encounter (HOSPITAL_COMMUNITY): Payer: Medicaid Other

## 2010-08-28 LAB — DIFFERENTIAL
Basophils Relative: 0 % (ref 0–1)
Eosinophils Absolute: 0.2 10*3/uL (ref 0.0–0.7)
Lymphs Abs: 1.7 10*3/uL (ref 0.7–4.0)
Monocytes Absolute: 0.6 10*3/uL (ref 0.1–1.0)
Monocytes Relative: 10 % (ref 3–12)
Neutrophils Relative %: 57 % (ref 43–77)

## 2010-08-28 LAB — CBC
HCT: 35 % — ABNORMAL LOW (ref 36.0–46.0)
Hemoglobin: 11.6 g/dL — ABNORMAL LOW (ref 12.0–15.0)
MCHC: 33.3 g/dL (ref 30.0–36.0)
MCV: 89.5 fL (ref 78.0–100.0)
RBC: 3.91 MIL/uL (ref 3.87–5.11)
WBC: 5.6 10*3/uL (ref 4.0–10.5)

## 2010-08-28 LAB — PROTIME-INR: INR: 2.1 — ABNORMAL HIGH (ref 0.00–1.49)

## 2010-08-28 LAB — BASIC METABOLIC PANEL
CO2: 31 mEq/L (ref 19–32)
Chloride: 99 mEq/L (ref 96–112)
Creatinine, Ser: 1.07 mg/dL (ref 0.4–1.2)
GFR calc Af Amer: >60 mL/min (ref >60)
Potassium: 3.7 mEq/L (ref 3.5–5.1)
Sodium: 137 mEq/L (ref 135–145)

## 2010-08-29 ENCOUNTER — Encounter (HOSPITAL_COMMUNITY): Payer: Self-pay

## 2010-08-29 ENCOUNTER — Ambulatory Visit (HOSPITAL_COMMUNITY)
Admission: RE | Admit: 2010-08-29 | Discharge: 2010-08-29 | Disposition: A | Discharge status: Home / Self Care | Payer: Medicaid Other | Source: Ambulatory Visit | Attending: General Surgery | Admitting: General Surgery

## 2010-08-29 DIAGNOSIS — R109 Unspecified abdominal pain: Secondary | ICD-10-CM | POA: Insufficient documentation

## 2010-08-29 HISTORY — DX: Essential (primary) hypertension: I10

## 2010-08-29 MED ORDER — TECHNETIUM TC 99M MEBROFENIN IV KIT
5.0000 | PACK | Freq: Once | INTRAVENOUS | Status: AC | PRN
Start: 2010-08-29 — End: 2010-08-29
  Administered 2010-08-29: 5.5 via INTRAVENOUS

## 2010-10-26 NOTE — Group Therapy Note (Signed)
[name], [name]              ACCOUNT NO.:  [id number]   MEDICAL RECORD NO.:  [id number]          PATIENT TYPE:  INP   LOCATION:  A219                          FACILITY:  [facility]   PHYSICIAN:  Margaretmary Dys, M.D.DATE OF BIRTH:  1945-12-04   DATE OF PROCEDURE:  02/03/2006  DATE OF DISCHARGE:                                   PROGRESS NOTE   SUBJECTIVE:  The patient feels much better.  Numbness of the left hand an  arm is much better.  Also, she continues to complain of some weakness in her  left lower extremity.  Her speech may be back to normal.  The patient has  had chronic pain syndrome.  She denies any chest pain now or shortness of  breath.   OBJECTIVE:  GENERAL:  Conscious, alert, comfortable, in no acute distress.  VITAL SIGNS:  Blood pressure is 98/60, pulse is 43 to 50, respiratory rate  24, T-max 97.8 degrees.  Oxygen saturations 100% on 2 L.  HEENT:  Normocephalic and atraumatic.  Her oral mucosa was moist with no  exudates. Neck supple, no JVD or lymphadenopathy.  LUNGS:  Clear clinically with good air entry bilaterally.  HEART:  S1, S2 regular, no S3 gallops or rubs.  ABDOMEN:  Soft, nontender, bowel sounds were positive, no masses palpable.  EXTREMITIES:  No pitting pedal edema.  No calf induration.  CNS:  Exam was grossly intact.  Sensation was intact.  There was no focal  neurologic deficits.  Power and tone in all extremities were normal.   LABORATORY DATA:  Telemetry only revealed sinus bradycardia.  White blood  cell count 5,500, hemoglobin 12, hematocrit 35, platelet count 197,000 with  no left shift.  Sodium 138, potassium 4.4, chloride 101, CO2 32, glucose of  107, BUN 20, creatinine 1.2, calcium was 8.7.   ASSESSMENT AND PLAN:  [name] is a 65 year old Caucasian female  admitted with numbness and weakness on the left side.  Her CT scan is  negative.  The patient remains on Coumadin and also on aspirin.  Perhaps the  patient may have had   transient ischemic attack but does not have any  evidence of neurologic deficits at this time.  We will continue to observe  for another 24 hours.  We will request physical therapy  and occupational  therapy for further evaluation.  I think if the patient continues to feel  well, past physical therapy evaluation she might be able to go home.  Will  continue to monitor her PT INR.   Will continue her on her multiple medications for her diabetes,  hypertension, and other multiple medical problems.      Margaretmary Dys, M.D.  Electronically Signed    AM/MEDQ  D:  02/03/2006  T:  02/03/2006  Job:  [id number]

## 2010-10-26 NOTE — Discharge Summary (Signed)
[name], [name]              ACCOUNT NO.:  [id number]   MEDICAL RECORD NO.:  [id number]          PATIENT TYPE:  INP   LOCATION:  A219                          FACILITY:  [facility]   PHYSICIAN:  Monica Hays. Dechurch, M.D.DATE OF BIRTH:  02/21/1946   DATE OF ADMISSION:  02/01/2006  DATE OF DISCHARGE:  08/28/2007LH                                 DISCHARGE SUMMARY   DIAGNOSES:  1. Left arm/hand numbness.  Possible transient ischemic attack.  However,      exam and history more consistent with neuropathy.  2. History of pulmonary embolus.  3. Anemia.  Hemoglobin 11.5 at the time of discharge.  4. History of diabetes mellitus, diet controlled.  5. Chronic anticoagulation therapy.  6. Degenerative disease of spine and shoulders by history.  7. Constipation.  8. Chronic obstructive pulmonary disease.  9. Gastroesophageal reflux.  10.Anxiety disorder.   DISPOSITION:  The patient is discharged to home.  Follow up with Dr. Morrie Sheldon in  10 to 14 days.   MEDICATIONS:  1. Aspirin 81 mg daily, coated.  2. Coumadin 2 mg daily except Friday.  3. Valium 10 mg t.i.d.  4. Amitiza 24 mcg b.i.d.  5. Crestor 5 mg daily.  6. KCL 20 mEq daily.  7. Lexapro 20 daily.  8. Lasix 40 daily.  9. Oxygen 2 L per nasal cannula.  10.Toprol XL 50 mg daily.   HOSPITAL COURSE:  [age]-year-old Caucasian female presented to the  emergency room with a history of numbness of her left hand and arm.  She  states she was in [place] and called her family physician who referred her  to the emergency room.  She states she had awakened with the numbness in her  left hand, predominately in her left thumb and first and second finger, but  it would not go away and normally it did.  She states she sleeps with her  arms on pillows secondary to paresthesias.  She is followed by Dr. Fannie Knee where  she has had regular injections of her shoulder and first MCP.  In any event,  she was admitted to the hospital.  CT scan revealed no  acute abnormality.  She was admitted to the telemetry floor.  Family had noted there may have  been some dysarthria but no dysphagia was noted.  She also noted some heavy  chest pain which was resolved without any intervention.  In any event, she  has admitted to the hospital and monitored.  Her INR was therapeutic at 2.4.  Cardiac enzymes were normal.  EKG revealed no acute changes.  CT, as noted  above, revealed no acute changes.  The question was whether this was related  to peripheral neuropathy versus an actual CNS event.  Given her history of  diabetes, aspirin is probably a reasonable intervention.  Her numbness had  dissipated by the time of discharge but was actually able to be reproduced  with position, raising the question of whether she would benefit from a  neurology evaluation.  Certainly, there was no muscle weakness to suggest  acute event or need for more aggressive care.  A cock-up wrist splint was  written for the patient that she can wear at night.  Should she have  bilateral upper extremity paresthesias, then further evaluation of her  cervical spine may be reasonable and, again, this can be done as an  outpatient.  She was anxious for discharge and stable and, therefore,  discharged to home with the regimen as noted above.      [name], M.D.  Electronically Signed     FED/MEDQ  D:  02/04/2006  T:  02/05/2006  Job:  [id number]   cc:   Franklyn Lor, MD  Fax: [phone]

## 2010-10-26 NOTE — H&P (Signed)
[name], [name]              ACCOUNT NO.:  [id number]   MEDICAL RECORD NO.:  [id number]          PATIENT TYPE:  INP   LOCATION:  A219                          FACILITY:  [facility]   PHYSICIAN:  Toby L. Fugate, D.O.   DATE OF BIRTH:  [birth year]   DATE OF ADMISSION:  02/01/2006  DATE OF DISCHARGE:  LH                                HISTORY & PHYSICAL   PRIMARY CARE PHYSICIAN:  Franklyn Lor, M.D.   REASON FOR VISIT:  Numbness of the left arm.   HISTORY OF PRESENT ILLNESS:  [name] is 65 year old Caucasian female who  presented to the ED earlier today due to numbness of her left hand and arm.  She noticed this earlier this morning when she woke up.  She had some  decreased strength in her left arm.  She denied any weakness of her left  lower extremity.  Her family noted that her speech was somewhat slurred.  She denied any difficulty swallowing.  There were no changes in her vision  or hearing.  In the ED, a CT scan of the head showed stable minimal diffuse  cerebral atrophy, no acute abnormality.  EKG showed sinus bradycardia.  Other labs were essentially unremarkable.  When I saw the patient, she  continued to complain of numbness of her left extremity.  It had improved  somewhat, however.  She also complained of a heavy 5/10 chest pain.  This  pain had gradually resolved on its own.  When I saw the patient, she said  the pain was no more than 1/10.  There was no radiation of this pain.   PAST MEDICAL AND SURGICAL HISTORY:  1. Pulmonary embolus.  2. Anemia.  3. COPD.  4. Depression and anxiety.  5. Home O2.  6. Diet-controlled diabetes.  7. Gastroesophageal reflux disease.  8. Tubal ligation.   MEDICATIONS:  1. Valium 10 mg p.o. t.i.d.  2. Amitiza 24 mcg p.o. b.i.d.  3. Crestor 5 mg p.o. daily.  4. Potassium chloride 20 mEq p.o. daily.  5. Lexapro 20 mg p.o. daily.  6  Nexium 40 mg p.o. daily.  7  Coumadin 2 mg p.o. daily, except for Friday.  1. Lasix 40 mg p.o.  daily.  2. Oxygen 2 liters by nasal cannula.  3. Meloxicam 50 mg p.o. daily.  4. Toprol XL 50 mg p.o. daily   ALLERGIES:  NO KNOWN DRUG ALLERGIES.   SOCIAL HISTORY:  The patient is married.  She is disabled.  She quit smoking  in December 2006.  Prior to that, she had a 50-year pack smoking history.  She denies alcohol or IV drug abuse.   FAMILY HISTORY:  Brother had liver cancer.  Father had prostate cancer.  There is a family history of Alzheimer disease.   REVIEW OF SYSTEMS:  A complete 12-point review of systems was reviewed and  was negative except as in HPI.   PHYSICAL EXAMINATION:  VITAL SIGNS:  Temperature 97.1, blood pressure  119/60, pulse 54, respiratory rate 20.  HEENT:  Pupils equally round and reactive to light.  Extraocular muscles  intact.  No scleral icterus.  Oropharynx clear moist.  No erythema or  thrush.  NECK:  No JVD, no carotid bruit, no adenopathy.  HEART:  Regular rate and rhythm.  No murmurs, rubs or gallops.  LUNGS:  Clear to auscultation bilaterally.  No wheezes, rales, or rhonchi.  ABDOMEN:  Positive bowel sounds, nontender, nondistended.  EXTREMITIES:  No edema, clubbing or cyanosis.  NEUROLOGIC:  Cranial nerves II-XII intact.  No focal deficits.  The patient  did complain of numbness of the left upper extremity.   LABORATORY:  White blood cell count 5.6, hemoglobin 12.2, hematocrit 35.9,  platelets 219.  Troponin negative x1.  Sodium 138, potassium 4.5, chloride  105, BUN 23, glucose 100.   CT of the head showed stable minimal diffuse cerebral atrophy.   EKG showed sinus bradycardia.   ASSESSMENT/PLAN:  1. A transient ischemic attack.  I will admit the patient to a telemetry      bed.  I will start aspirin, in addition to the patient's Coumadin.  I      will monitor the patient's neurologic exam overnight.  Her symptoms      have already begun to resolve.  2. Chest pain.  The patient has had similar chest pain in the past.  A      thorough  workup was unrevealing.  I will monitor troponin.  I will      recheck an EKG in the morning.  3. Hypercholesterolemia.  I will continue Crestor.  4. Depression/anxiety.  I will continue Lexapro.  5. Hypertension.  I will continue the patient on Toprol.  6. Diabetes.  Apparently, this is diet-controlled.  7. Insomnia.  I will provide amitriptyline 25 mg orally at bedtime.  8. Gastroesophageal reflux disease.  I will continue Nexium.      Toby L. Fugate, D.O.  Electronically Signed    TLF/MEDQ  D:  02/01/2006  T:  02/01/2006  Job:  [id number]

## 2010-10-26 NOTE — Group Therapy Note (Signed)
[name], [name]              ACCOUNT NO.:  [id number]   MEDICAL RECORD NO.:  [id number]          PATIENT TYPE:  INP   LOCATION:  A208                          FACILITY:  [facility]   PHYSICIAN:  Margaretmary Dys, M.D.DATE OF BIRTH:  [birth year]   DATE OF PROCEDURE:  07/28/2005  DATE OF DISCHARGE:                                   PROGRESS NOTE   SUBJECTIVE:  The patient feels better this morning.  She denies any chest  pain or significant shortness of breath.   She has extensive bilateral pulmonary embolus.  Coumadin was held because of  an elevated INR.  She overall feels stable.   OBJECTIVE:  Conscious, alert, comfortable.  The patient appears quite  anxious.  VITAL SIGNS:  Blood pressure was 123/68, pulse 66, respiratory rate 21, T-  max 97 degrees Fahrenheit, oxygen saturation 97% on 2 L.  HEENT EXAM:  Normocephalic, atraumatic.  Oral mucosa moist, no exudates.  NECK:  Supple, no JVD, no lymphadenopathy.  LUNGS:  Clear clinically.  HEART:  S1 and S2 regular, no S3, S4, gallops, or rubs.  ABDOMEN:  Obese, soft, nontender, bowel sounds positive.  EXTREMITIES:  Bilateral pitting pedal edema, no calf induration.  CENTRAL NERVOUS SYSTEM EXAM:  Grossly intact.   LABORATORY DATA:  PT 39.4, INR 4.1.   ASSESSMENT AND PLAN:  Problem 1.  The patient with acute bilateral pulmonary  embolus, on anticoagulation.  Heparin has been continued.  The patient is  currently on Coumadin.  INR is still supratherapeutic, and Coumadin is on  hold.  We will recheck INR in the morning.   Problem 2.  Anxiety/depression.  Continue on patient's Valium. She seems to  be obsessive-compulsive about the timing.  I have discussed with the nurse,  and I went in to walk to with so she does not get overtly anxious while in  the hospital.  Will also continue her on Elavil and her Lexapro.  She is  also on Robaxin.   Problem 3.  Diabetes.  Blood sugars are fairly satisfactory.  Continue on  Glucophage.   Problem 4.  Gastrointestinal prophylaxis with Protonix.   I spent an extensive amount of time discussing with her that she will need  further education on the use of Coumadin and what medications such as non-  steroidal anti-inflammatory agents she should be aware of because of the  potential interaction and risk of increasing gastrointestinal bleed.   DISPOSITION:  I think she can be safely discharged home in the next day or  two once her INR approaches the therapeutic range.  She will need a followup  at Coumadin clinic.      Margaretmary Dys, M.D.  Electronically Signed     AM/MEDQ  D:  07/28/2005  T:  07/28/2005  Job:  [id number]

## 2010-10-26 NOTE — Op Note (Signed)
[name], [name]              ACCOUNT NO.:  [id number]   MEDICAL RECORD NO.:  [id number]          PATIENT TYPE:  INP   LOCATION:  A215                          FACILITY:  [facility]   PHYSICIAN:  R. Roetta Sessions, M.D. DATE OF BIRTH:  1946-01-13   DATE OF PROCEDURE:  09/19/2005  DATE OF DISCHARGE:                                 OPERATIVE REPORT   PROCEDURE:  Esophagogastroduodenoscopy with Elease Hashimoto dilation followed by  colonoscopy, ileoscopy.   INDICATIONS:  The patient is a 65 year old lady with admitted to the  hospital and noted to have a significant drop in her hemoglobin.  She  reports melena recently as an outpatient although stool was hemoccult  negative on admission. Also has esophageal dysphagia to solids.  She has  never had her lower GI tract imaged.  EGD and colonoscopy are now being  done.  Potential for esophagomalacia were reviewed.  The potential risks,  benefits and alternatives have been reviewed. Questions answered __________  .  Please see documentation in the medical record.   DESCRIPTION OF PROCEDURE:  Oxygen saturation, blood pressure, pulse and  respiration were monitored throughout the entire procedure.  Conscious  sedation with Versed 6 mg IV and Demerol 100 mg IV in divided doses.  A SBE  prophylaxis with vancomycin 1 g IV, gentamicin 120 mg IV.  Tessalon perles  for topical oropharyngeal anesthesia.  The instrument was the Olympus video  chip system.   EGD FINDINGS:  Esophagus:  Examination of the tubular esophagus revealed no  mucosal abnormalities.  The EG junction was easily traversed.   Stomach:  The gastric cavity was emptied and insufflated with air.  A  thorough examination of the gastric mucosa including retroflexion of the  proximal stomach and esophagogastric junction demonstrated only small hiatal  hernia.  Gastric mucosa also appeared normal.  Pylorus was patent and easily  traversed.  Duodenum, bulb and second portion revealed no  abnormalities.   THERAPY AND DIAGNOSTIC MANEUVERS:  A 54-French Maloney dilator was passed to  full insertion with ease.  A look back revealed no apparent complications  related to passage of the dilator.  The patient was prepared for  colonoscopy.   COLONOSCOPY:  Digital rectal exam revealed no abnormalities.   ENDOSCOPIC FINDINGS:  Prep was adequate.   Rectum:  Examination of the rectal mucosa including retroflexed view of the  anal verge __________  anal canal demonstrated minimal internal hemorrhoids  only.   Colon:  Colonic mucosa was surveyed from the rectosigmoid junction through  the left, transverse,  right colon to the area of the appendiceal orifice,  ileocecal valve and cecum. These structures were well seen and photographed  for the record.  The air button no the scope malfunctioned and was unable to  insufflate the colon.  Once I got to the right side, I had to actually  remove the colonoscope and get another scope and easily advanced all the way  to the cecum.  I intubated the terminal ileum to 5 cm.  From this level we  slowly and cautiously withdrew the scope and reviewed all previously  mentioned mucosal surfaces.  The patient had a 1.5 cm submucosal, fatty  lesion in the mid transverse colon and one opposite the ileocecal valve  consistent with lipomas.  These were submucosal lesions.  The remainder of  the colonic mucosa appeared normal.  There was an orange tint to the colonic  mucus throughout the colon consistent with the patient's history of  ingestedJell-O yesterday.  There was no blood in the lower GI tract and the  colonic mucosa again appeared normal.  The patient tolerated the procedure  well and was reactive to endoscopy.   IMPRESSION:  1.  Esophagogastroduodenoscopy:  Normal esophagus, small hiatal hernia,      otherwise normal stomach, D1 and D2.  Status post  passage of Maloney      dilator.  2.  Colonoscopy:  Minimal internal hemorrhoids;  otherwise normal rectum,      colon and terminal ileum.   RECOMMENDATIONS:  1.  There is no explanation for the significant drop in her hemoglobin via a      mechanism of GI bleeding, via anything seen in either the upper or the      lower GI tract today.  She does have some gross blood per rectum, small      amount which is most likely from a benign anorectal source.  Since she      has had a significant drop in her hemoglobin and reports melena as an      outpatient, I feel we should go ahead and do a Gibbons small bowel      capsule study to complete her inpatient work-up.  Will plan to perform a      Gibbon small bowel capsule study on September 20, 2005.  2.  Further recommendations to follow.      Jonathon Bellows, M.D.  Electronically Signed     RMR/MEDQ  D:  09/19/2005  T:  09/19/2005  Job:  [id number]   cc:   Osvaldo Shipper, MD

## 2010-10-26 NOTE — Procedures (Signed)
[name], [name]              ACCOUNT NO.:  [id number]   MEDICAL RECORD NO.:  [id number]          PATIENT TYPE:  INP   LOCATION:  A219                          FACILITY:  [facility]   PHYSICIAN:  Edward L. Juanetta Gosling, M.D.DATE OF BIRTH:  [birth year]   DATE OF PROCEDURE:  02/01/2006  DATE OF DISCHARGE:                                EKG INTERPRETATION   The rhythm is sinus rhythm with a bradycardic rate in the 40s.  The axis is  leftward but does not meet criteria for left axis deviation.  There is  generally low voltage.  Slow R wave progression across the precordium is  seen as well.  T-wave abnormalities are seen which are nonspecific.  Abnormal electrocardiogram.      Oneal Deputy. Juanetta Gosling, M.D.  Electronically Signed     ELH/MEDQ  D:  02/02/2006  T:  02/03/2006  Job:  [id number]

## 2010-10-26 NOTE — H&P (Signed)
[name], [name]              ACCOUNT NO.:  [id number]   MEDICAL RECORD NO.:  [id number]          PATIENT TYPE:  INP   LOCATION:  A215                          FACILITY:  [facility]   PHYSICIAN:  Osvaldo Shipper, MD     DATE OF BIRTH:  1946/05/15   DATE OF ADMISSION:  09/17/2005  DATE OF DISCHARGE:  LH                                HISTORY & PHYSICAL   PRIMARY CARE PHYSICIAN:  Dr. Maisie Fus Day from [facility].   ADMISSION DIAGNOSES:  1.  Chest pain, rule out recurrent pulmonary embolus.  2.  History of pulmonary embolus.  3.  Severe anemia, rule out gastrointestinal bleed.  4.  History of chronic obstructive pulmonary disease.  5.  History of depression.  6.  History of chronic home O2.   CHIEF COMPLAINT:  Chest pain, shortness of breath for the past three days.   HISTORY OF PRESENT ILLNESS:  The patient is a 65 year old Caucasian female  who was admitted to the hospitalist service here from February 13 through  July 29, 2005.  She was admitted at that time with a pulmonary emboli.  The patient was started on anticoagulation and was put on Coumadin.  The  patient did well since being released from the hospital at that time.  She  mentioned that over the past few days she has been noticing overall  weakness.  She has been finding very less energy when to get out of her  recliner.  She also gives history of shortness of breath even at rest.  She  gives a history of chest pain which is left-sided, describes a sharp pain  under her breast.  It seems to increase with deep breathing.  It does seem  to actually radiate to the right breast but does not seem to go anywhere  else.  She does have back pain which is her usual back pain from her spine  disease.  Denied any nausea, vomiting.  Does have chronic cough but nothing  acute.  No fever at home.  No palpitation that she gives a history of.  No  diaphoresis. The patient mentions that she has been feeling very  anxious  about her condition.   She also gives a history of dark-colored stools about two weeks ago lasting  about three to four days.  Denies any frank hematochezia but has noticed  some blood on her tissue paper and she has been told by her primary care  physician that this is likely hemorrhoidal.  Has no episodes of hematemesis.  Does give history of some nasal bleeding on and off.   MEDICATIONS:  1.  Valium 10 mg t.i.d.  2.  Zelnorm 6 mg daily.  3.  Crestor 5 mg daily.  4.  Potassium chloride 20 mEq daily.  5.  Lexapro 20 mg daily.  6.  Nexium 40 mg daily.  7.  Coumadin 3 mg daily.  8.  Lasix 40 mg daily.  9.  Oxygen by nasal cannula 2 liters per minute.  10. Meloxicam 15 mg daily.  11. Toprol XL 50 mg daily.  ALLERGIES:  NOVOCAIN and PENICILLIN.   PAST MEDICAL HISTORY:  1.  Hypertension.  2.  Diet-controlled diabetes.  3.  Asthma.  4.  Emphysema.  5.  Acid reflux disease.  6.  Recent admission for PE.  7.  History of anxiety disorder and depression.   PAST SURGICAL HISTORY:  1.  Tubal ligation.  2.  Dental surgery.   SOCIAL HISTORY:  Lives in [place] with her husband.  On disability.  Quit  smoking back in December of 2006.  Has probably about a 50-pack-year history  of smoking.  No alcohol use and no illicit drug use.  She uses a cane and/or  a walker.   FAMILY HISTORY:  Significant for liver cancer in a brother, prostate cancer  in her father, heart disease and thyroid disease in her family.  Alzheimer's  disease is also present.  History of leukemia in the family.  Her daughter  has also some gynecological cancer apparently.   REVIEW OF SYSTEMS:  A 10-point review of systems was unremarkable except as  mentioned in the HPI.   PHYSICAL EXAMINATION:  VITAL SIGNS:  Temperature 97.2, blood pressure  111/59, heart rate is in the 50s to 60s, respiratory rate is about 20.  Saturating 98% on two liters by nasal cannula.  GENERAL:  An obese white female in no  apparent distress.  Very anxious.  HEENT:  There is mild pallor.  No icterus.  Oral mucus membrane is moist.  No oral lesions are noted.  LUNGS:  Reveal a few crackles at the bases.  Otherwise clear to  auscultation.  CARDIOVASCULAR:  S1 and S2 is normal, regular.  No murmurs appreciated.  ABDOMEN:  Soft, nontender, and nondistended.  Bowel sounds are present.  No  mass or organomegaly appreciated. Rectal examination was done by ED which  was heme-negative.  EXTREMITIES:  Mild edema bilaterally.  No calf tenderness present.  NEUROLOGICAL:  The patient is alert and oriented x3.  No focal neurologic  deficits appreciated.   LABORATORY DATA:  CBC shows white count of 10.7 with a slight lymphocytosis,  hemoglobin is 8.1 which is a new finding, MCV is 90.6, platelet count is  333,000.  INR is 2.3, d-dimer is 0.22.  BNP shows glucose of 146.  Otherwise  unremarkable.  One set of cardiac markers are negative.   Chest x-ray was done which showed cardiomegaly but no edema, left basilar  atelectasis was noted.  Stable right mid-lung granuloma was noted.   IMPRESSION:  This is a 65 year old Caucasian female with chronic obstructive  pulmonary disease, hypertension, diabetes on diet control, recent diagnosis  of pulmonary embolus who presents with chest pain, shortness of breath, and  is found to be anemic.  Her shortness of breath likely related to anemia.  Her chest pain also with a history of pulmonary embolus.  I think this is  needs to be pursued further.  This could be musculoskeletal.  She does have  a normal d-dimer in a patient with a high probability of pulmonary embolus.  Hence, CT of the chest will be obtained.  Venous Dopplers of the legs will  be obtained.  The source of anemia is worrisome.  The patient did have a  normal hemoglobin a few months ago.  She admitted to dark stools a few weeks ago.  Gastrointestinal bleed needs to be excluded especially since the  patient will need to  be on anticoagulation.   PLAN:  1.  Anemia:  We will  check iron profile study, transfuse her two units of      blood as she is minimally symptomatic with her anemia.  We will have      gastroenterology see her.  She does not have any active bleeding at this      time, hence her INR will not be aggresively reversed. Depending on when      and if gastrointestinal procedure is planned, her INR may be reversed      appropriately. Protonix will be started on this patient.   1.  Chest pain:  We will repeat CT angiogram of the chest to rule out      pulmonary embolus in this patient.  If she does have recurrent pulmonary      embolus, consideration may have to be given to getting a [place]      filter place.  I will also get a venous Doppler study done of her lower      extremities.  She will be ruled out for acute coronary syndrome as well.      She had an echocardiogram during her previous admission.   She will be continued on other medications except for Coumadin at this time.   Further management decision will be based on results from initial testing  and the patient's response to treatment.      Osvaldo Shipper, MD  Electronically Signed     GK/MEDQ  D:  09/18/2005  T:  09/18/2005  Job:  [id number]   cc:   Franklyn Lor, MD  Fax: [phone]   Lionel December, M.D.  [id number]  [place]  [place] [address]

## 2010-10-26 NOTE — Procedures (Signed)
NAMEALEXXUS, SOBH              ACCOUNT NO.:  [id number]   MEDICAL RECORD NO.:  [id number]          PATIENT TYPE:  INP   LOCATION:  A208                          FACILITY:  [facility]   PHYSICIAN:  Vida Roller, M.D.   DATE OF BIRTH:  February 01, 1946   DATE OF PROCEDURE:  07/24/2005  DATE OF DISCHARGE:                                  ECHOCARDIOGRAM   TAPE NUMBER:  [id number]   TAPE COUNT:  [id number]   HISTORY OF PRESENT ILLNESS:  This is a 65 year old woman for LV systolic  function.  Technical quality of this study is difficult.   M-MODE TRACINGS:  The aorta is 27 mm.   Left atrium is 33 mm.   Septum is 11 mm.   Posterior wall is 11 mm.   Left ventricular diastolic dimension 33 mm.   Left ventricular systolic dimension 23 mm.   A 2D AND DOPPLER IMAGING:  Left ventricle is normal size.  There is  flattening of the septum with a left ventricle that is shaped in a D.  There is no obvious wall motion abnormality.   Right ventricle is dilated.  The ventricle is hypokinetic with depressed RV  systolic function.  The right ventricular free wall is not well seen.   The right atria is dilated.   The left atria is normal size.   Aortic valve is mildly sclerotic with trace insufficiency.  No stenosis is  seen.   The mitral valve has mild to moderate anular calcification with trace to  mild regurgitation.  No stenosis is seen.   The tricuspid valve is not well seen.  There appears to be some level of  tricuspid regurgitation by Doppler profile, but it is not well quantified.  There is a an estimated right ventricular systolic pressure between 40-45  mmHg.   There is no pericardial effusion.      Vida Roller, M.D.  Electronically Signed     JH/MEDQ  D:  07/24/2005  T:  07/25/2005  Job:  [id number]

## 2010-10-26 NOTE — Group Therapy Note (Signed)
NAMECATHLENE, [name]              ACCOUNT NO.:  [id number]   MEDICAL RECORD NO.:  [id number]          PATIENT TYPE:  INP   LOCATION:  A215                          FACILITY:  [facility]   PHYSICIAN:  Margaretmary Dys, M.D.DATE OF BIRTH:  December 30, 1945   DATE OF PROCEDURE:  09/20/2005  DATE OF DISCHARGE:                                   PROGRESS NOTE   SUBJECTIVE:  The patient feels a little bit better.  She had an EGD and  colonoscopy yesterday which was unremarkable.  She is currently having the  capsular endoscopy study downloaded at this time, results are not available.  She has had no more blood in her stools.  She is not coughing up any blood.  She has no black stools.   OBJECTIVE:  GENERAL:  Conscious, alert, comfortable not in acute distress, the patient  appeared occasionally tangential with her answers.  VITAL SIGNS:  Blood pressure 109/56, pulse 64, respirations 16, temperature  98.  Blood sugars have ranged between 117 to 133.  Oxygen saturation is 97%  on 2 liters.  HEENT:  Normocephalic, atraumatic, oral mucosa moist with no exudates.  NECK:  Supple, no JVD, no lymphadenopathy.  LUNGS:  Clear clinically with good air entry bilaterally.  HEART:  S1 and S2 regular, no S3, S4, gallops, or rubs.  ABDOMEN:  Soft, nontender, bowel sounds positive.  EXTREMITIES:  No edema.   LABORATORY DATA:  White blood cell count 7.1, hemoglobin 14, hematocrit 42,  platelet count 206, no left shift.  Protime/INR from yesterday revealed 18.9  and 1.6.  Sodium 141, potassium 3.3, chloride 99, CO2 39, glucose 110, BUN  12, creatinine 0.8, calcium 9.2, magnesium 1.8.   ASSESSMENT/PLAN:  [name] is a 65 year old Caucasian female  admitted with chest pain but has ruled out for any evidence of myocardial  infarction or recurrent pulmonary embolus.  An ultrasound of her legs was  also negative for deep venous thrombosis.  I am a little skeptical of her  H&H on the day of admission, it was  8 and 23.  The patient received 2 units  of blood and this improved dramatically to 15 and 43 and stayed that way  ever since.  The patient has had no more episodes of bleeding and EGD and  colonoscopy was completely negative.  She is also heme negative.  I think it  is reasonable at this time to resume her Coumadin as I think the benefit  outweighs the risks at this time.  We will start her on 5 mg and work  ourselves down to 3 mg which she was.  I anticipate that  she may be discharged in the next 2-3 days.  I would advance her diet as  tolerated.  We will replace her potassium and continue all her previous  medications.  I spent an extensive amount of time discussing all these  issues and her other medical problems with her.      Margaretmary Dys, M.D.  Electronically Signed     AM/MEDQ  D:  09/20/2005  T:  09/20/2005  Job:  [id number]

## 2010-10-26 NOTE — Procedures (Signed)
[name], [name]              ACCOUNT NO.:  [id number]   MEDICAL RECORD NO.:  [id number]           PATIENT TYPE:   LOCATION:                                FACILITY:  [facility]   PHYSICIAN:  Edward L. Juanetta Gosling, M.D.DATE OF BIRTH:  [birth year]   DATE OF PROCEDURE:  DATE OF DISCHARGE:                                EKG INTERPRETATION   September 27, 2005 at 2:28.  The rhythm is sinus rhythm with a rate in the 60s.  There was generally low voltage.  Diffuse but nonspecific ST-T wave changes  are seen.   Abnormal electrocardiogram.      Oneal Deputy. Juanetta Gosling, M.D.  Electronically Signed     ELH/MEDQ  D:  09/27/2005  T:  09/27/2005  Job:  [id number]

## 2010-10-26 NOTE — Discharge Summary (Signed)
[name], [name]              ACCOUNT NO.:  [id number]   MEDICAL RECORD NO.:  [id number]          PATIENT TYPE:  INP   LOCATION:  A208                          FACILITY:  [facility]   PHYSICIAN:  Lonia Blood, M.D.       DATE OF BIRTH:  1946/05/07   DATE OF ADMISSION:  07/23/2005  DATE OF DISCHARGE:  02/19/2007LH                                 DISCHARGE SUMMARY   DISCHARGE DIAGNOSES:  1.  Pulmonary emboli.  2.  Syncope, most likely secondary to pulmonary emboli.  3.  Emphysema.  4.  Hypertension.  5.  Diabetes, diet controlled.  6.  Gastroesophageal reflux disease.  7.  Anxiety disorder.  8.  Asthma.  9.  Hyperlipidemia.  10. Urinary incontinence.  11. Peripheral vascular disease.  12. Depression.  13. Chronic respiratory failure.   DISCHARGE MEDICATIONS:  1.  Coumadin 3 mg by mouth at 6 o'clock tonight, July 29, 2005.  Further      adjustments of the patient's Coumadin to be done by Dr. Morrie Sheldon at [facility].  2.  Toprol XL 50 mg by mouth daily.  3.  Crestor 5 mg by mouth daily.  4.  Lasix 40 mg by mouth daily.  5.  Potassium chloride 20 mEq by mouth daily.  6.  Lexapro 20 mg by mouth daily.  7.  Elavil 25 mg by mouth at bedtime.  8.  Multivitamins by mouth daily.  9.  Amitriptyline 25 mg by mouth at bedtime.  10. Multivitamins 1 tablet daily.  11. Tramadol 25 mg by mouth every 8 hours as needed for pain.   CONDITION ON DISCHARGE:  [name] will discharge in fair condition at  time of discharge. She will require oxygen per nasal cannula.  She was  educated about Coumadin use, and she was educated about following a diabetic  diet.   She will follow up with the Coumadin clinic at [facility] on July 30, 2005, at 12 p.m., and she will have a PT/INR check,  and further adjustment of the Coumadin will be made.  Also, she will follow  up with Dr. Morrie Sheldon at [facility] on August 06, 2005,  at  10:30 a.m. for hospital followup visit.   At the time of discharge, [name] had home health PT, OT, RN set up.  She  also has home oxygen set up.   Also, [name] was educated not to use nonsteroidal anti-inflammatory  drugs together with the Coumadin due to the risk of bleed.   PROCEDURES DURING THIS ADMISSION:  [name] had a computed tomography of  the chest with intravenous contrast on July 23, 2005, which showed  extensive bilateral pulmonary emboli and diffuse fatty infiltration of the  liver.   The patient had a head CT without contrast on July 26, 2005, which  showed no acute intracranial abnormality.   CONSULTATIONS:  The patient was seen in consultation by Dr. Gerilyn Pilgrim.   For admission History and Physical, please refer to the dictated H&P done by  Dr.  Roxan Hockey on July 23, 2005.   HOSPITAL COURSE:  #1.  BILATERAL PULMONARY EMBOLI:  [name] was admitted to the intensive  care unit where she was started on intravenous heparin and oral Coumadin.  She had hypercoagulable panel ordered, and protein-C, protein-S, factor V  Leiden were normal.  She actually does not have factor V Leiden.  It was  thought the reason for peripheral vascular disease and PE was the fact that  she is most of the time lying in bed.  The patient was monitored here in the  hospital, and she had no recurrence of her syncopal event.  She was kept on  intravenous heparin and oral Coumadin.  She responded well to the above  treatment.  Pharmacy service dosed the heparin and the Coumadin.  There were  some difficulties with the patient's INR.  On July 27, 2005, it was  noted that the patient's INR was significantly elevated at a level of 4.  Because of that, her Coumadin was held.  On July 29, 2005, the patient's  INR returned to normal value of 2.3.  Because of that, she was discharged  home with 3 mg Coumadin at 6 p.m., and she will follow up with her primary  care  physician's Coumadin clinic for further adjustment of Coumadin.  The  patient had received Coumadin teaching, and it was explained to patient to  limit vitamin-K-rich foods.  She was also advised to eat well and eat enough  protein.   #2Wynelle Beckmann EVENT:  When [name] was admitted, she had a syncopal  event.  The etiology of this becomes obvious as she had a pulmonary embolus.  While treated here in the hospital for the PE, she had no recurrence of the  event.   #3.  CHRONIC RESPIRATORY FAILURE:  Most likely secondary to emphysema, and  poor oxygenation was worsened by the fact the patient had a PE.  At the time  of discharge, [name] had oxygen saturation on room air of 87%, so she  was placed on home oxygen. In the future, there might be a possibility that  [name] may be able to come off the oxygen, but this will have to be  addressed by her primary care physician.   #4.  HYPERTENSION:  This has been well controlled on metoprolol and  furosemide here in the hospital.   #5.  DEPRESSION:  [name] was kept on amitriptyline and Lexapro in the  hospital without any significant problems.   #6.  CHRONIC BACK PAIN:  [name] was using nonsteroidals on an  outpatient basis, but she was educated on the fact that she cannot use them  anymore due to the fact that she is going to use Coumadin now.  She was  recommended tramadol and Tylenol or her pain.      Lonia Blood, M.D.  Electronically Signed     SL/MEDQ  D:  07/29/2005  T:  07/29/2005  Job:  [id number]   cc:   Dr. Morrie Sheldon, [place] Miles. P.
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
## 2010-10-26 NOTE — H&P (Signed)
[name], [name]              ACCOUNT NO.:  [id number]   MEDICAL RECORD NO.:  [id number]          PATIENT TYPE:  EMS   LOCATION:  ED                            FACILITY:  [facility]   PHYSICIAN:  Michaelyn Barter, M.D. DATE OF BIRTH:  [birth year]   DATE OF ADMISSION:  07/23/2005  DATE OF DISCHARGE:  LH                                HISTORY & PHYSICAL   CHIEF COMPLAINT:  Shortness of breath.   HISTORY OF PRESENT ILLNESS:  [name] is a 65 year old female with a past  medical history of emphysema, hypertension, acid reflux, and asthma, who  states that last night she got up to go to the bathroom and fell on her way  back to the bathroom.  She had to be dragged from the floor to the bed by  her grandson.  She slept from midnight up until approximately 12 noon today.  She states that she has a baseline of being short of breath secondary to her  emphysema, but when she woke up around noon today, she was much more short  of breath than usual.  She had the sensation of there being a heavy amount  of pressure against her chest wall today.  She states that for at least a  month, she has had to sleep in a recliner secondary to being short of breath  whenever she tries to lie flat.  Likewise, she states she needs a large  number of pillows in order to sleep on.  Today following her episode of  progressive shortness of breath and chest pressure, she felt as if she was  going to pass out.  EMS was called and when they arrived they gave her  nitroglycerin.  She went on to state she knows the difference between  hyperventilation and feeling short of breath.  She states her shortness of  breath today was not an episode of hyperventilation.   PAST MEDICAL HISTORY:  1.  Emphysema.  2.  Osteoporosis.  3.  Hypertension.  4.  Borderline diabetes mellitus.  5.  Acid reflux.  6.  Social anxiety disorder.  7.  Insomnia.  8.  Asthma.  9.  Hypercholesterolemia.  10. Fluid retention.  11. Vaginal  prolapse.  12. Bladder prolapse.  13. Urinary incontinence.  14. Peripheral vascular disease.   PAST SURGICAL HISTORY:  Tubal ligation in 1977.   ALLERGIES:  Penicillin produces generalized swelling and headaches.  Novocain causes head pain and nausea.   CURRENT MEDICATIONS:  1.  Toprol XL 50 mg p.o. daily.  2.  Furosemide, dose not known.  3.  Nexium, dose not known.  4.  Amitriptyline, dose not known.  5.  Lisinopril 20 mg p.o. daily at bedtime.  6.  Potassium supplements.  7.  Valium 10 mg p.o. t.i.d.  8.  Naproxen 200 mg p.o. b.i.d.  The patient was told recently to      discontinue the Naproxen.  9.  Vitamin C and vitamin D supplements.   SOCIAL HISTORY:  Cigarettes, the patient stopped smoking June 07, 2005.  She started smoking at the age of 78,  she smoked one pack per day.  Alcohol,  the patient drank alcohol years ago but she stopped 20 years ago.  She has a  history of alcoholism.   FAMILY HISTORY:  Mother has a history of thyroid problems, Alzheimer's  dementia, and an enlarged heart.  Father had a history of metastatic bladder  cancer.  She had a brother who died at the age of 62 from leukemia.  She  currently has a brother who is fighting liver cancer.  Her daughter has  metastatic liver cancer.   REVIEW OF SYMPTOMS:  As per HPI, otherwise, all other systems are negative.   PHYSICAL EXAMINATION:  GENERAL:  The patient is awake, she looks much older than her stated age,  she appears to be somewhat lethargic and looks tired and weak.  She speaks  in full sentences.  No dyspnea or tachypnea.  VITAL SIGNS:  Temperature 97.7, blood pressure on admission 122/83, heart  rate 98, respirations are 24.  HEENT:  Normocephalic, atraumatic, extraocular movements intact, pupils are  anicteric, oral mucosa is moist, pink, no thrush.  NECK:  Supple, no JVD, strong carotid upstrokes, no bruits, no  lymphadenopathy, thyromegaly is not appreciable.  CARDIAC:  S1 and S2  present, regular rate and rhythm, no murmurs, gallops,  and rubs.  RESPIRATORY:  No crackles or wheezes.  ABDOMEN:  Soft, nontender, nondistended, positive bowel sounds x 4  quadrants.  EXTREMITIES:  No edema is present within the lower extremities.  NEUROLOGICAL:  The patient is alert and oriented x 3.  Cranial nerves 2-12  are intact.  MUSCULOSKELETAL:  Strength is approximately 4.5/5 in both upper extremities  and approximately 4.5/5 lower extremity strength.   LABORATORY DATA:  Arterial blood gas with pH 7.398, pCO2 44.3, pO2 74.8,  bicarb 26.7, oxygen 94.5.  White blood cell count 6.9, hemoglobin 13,  hematocrit 38, platelet 183.  Protime 13.9, INR 1.1, D-dimer 2.89.  Sodium  136, potassium 4.2, chloride 101, CO2 30, glucose 125, BUN 19, creatinine 1,  bilirubin total 0.5, alkaline phos 105, SGOT 42, SGPT 45, total protein 6.7,  albumin 3.2, calcium 8.7.  CK MB, POC, 3.8 and 1.8, respectively.  Troponin  I, POC, 0.09 and less than 0.05.  Myoglobin, POC, 76.9 and 88.9.  CT of the  chest July 23, 2005, extensive bilateral PE with mild bibasilar linear  atelectasis or scarring accompanied by diffuse, fatty infiltration of the  liver.  Chest x-ray reveals cardiomegaly with pulmonary vascular congestion.   ASSESSMENT/PLAN:  [name] is a 65 year old female who has chronic  shortness of breath secondary to a history of emphysema presents here for  evaluation of an acute progression of shortness of breath.  1.  Extensive bilateral pulmonary emboli.  The patient has been started on      IV heparin within the ER, we will continue this for now.  We will also      request initiation of Coumadin.  We will also provide oxygen.  Will      check anti-thrombin 3, protein C and S, factor V liden.  Also, in light      of the patient describing symptoms of orthopnea and PND, will check a 2D      echocardiogram to evaluate the patient's overall cardiac function.  Will     maintain strict  inputs and outputs.  2.  History of emphysema.  Will continue the oxygen that has been provided      to the patient.  We will also start nebulized breathing treatments      consisting of Albuterol and Atrovent.  3.  Hypertension.  This currently appears to be stable, we will resume the      patient's previously prescribed Toprol XL.  4.  Borderline diabetes mellitus.  Will provide Accu-Chek's a.c. and h.s.  5.  History of acid reflux.  Will provide Protonix 40 mg daily.  6.  History of hypercholesterolemia.  Will check a fasting lipid profile on      the patient.  7.  History of anxiety disorder.  Will resume Valium.  8.  History of psychiatric issues.  Will resume Lexapro and Amitriptyline,      once the dosages are confirmed.  9.  History of falling.  Will implement fall precautions and will consult      physical therapy and occupational therapy.  10. Slightly elevated liver enzymes.  The source of this is questionable.      Will follow the patient's liver enzymes and will consider ultrasound of      the patient's abdomen.      Michaelyn Barter, M.D.  Electronically Signed     OR/MEDQ  D:  07/23/2005  T:  07/23/2005  Job:  [id number]

## 2010-10-26 NOTE — Discharge Summary (Signed)
[name], [name]              ACCOUNT NO.:  [id number]   MEDICAL RECORD NO.:  [id number]          PATIENT TYPE:  INP   LOCATION:  A215                          FACILITY:  [facility]   PHYSICIAN:  Margaretmary Dys, M.D.DATE OF BIRTH:  [birth year]   DATE OF ADMISSION:  09/17/2005  DATE OF DISCHARGE:  04/14/2007LH                                 DISCHARGE SUMMARY   DISCHARGE DIAGNOSIS:  1.  Chest pain, ruled out for myocardial infarction or pulmonary embolus.  2.  History of pulmonary embolus.  3.  Severe anemia with no evidence of a gastrointestinal bleed.  4.  History of chronic obstructive pulmonary disease.  5.  History of depression.  6.  History of chronic home oxygen use.   DISCHARGE MEDICATIONS:  1.  Valium 10 mg p.o. t.i.d.  2.  Zelnorm 6 mg p.o. daily.  3.  Crestor 5 mg p.o. daily.  4.  Potassium chloride 20 mEq p.o. daily.  5.  Lexapro 20 mg p.o. daily.  6.  Nexium 40 mg p.o. daily.  7.  Coumadin 3 mg to alternate with 1.5 mg every other day.  8.  Lasix 40 mg p.o. daily.  9.  Oxygen by nasal cannula at 2 liters per minute.  10. Meloxicam 50 mg p.o. daily.  11. Toprol XL 50 mg p.o. daily.   DISCHARGE INSTRUCTIONS:  Diet is diabetic diet, low salt, heart healthy.  Activity is as tolerated.   CONSULTATIONS:  GI with R. Roetta Sessions, M.D.   PROCEDURE PERFORMED:  EGD and colonoscopy with negative evidence of any  source of bleeding in the GI tract.   HOSPITAL COURSE:  [name] is a 65 year old Caucasian female admitted  with chest pain but ruled out for myocardial infarction or recurrent  pulmonary embolus.  Ultrasound of her legs was also negative for deep venous  thrombosis.  It was noted on initial admission that her H&H was 8 and 23.  The patient received 2 units of blood with significant improvement to 15 and  43 and it stayed that way.  Her EGD and colonoscopy was negative.  I doubt  if she had any evidence of melanotic stool as she was anemic when she came  in, in any case, the patient was stable,  had no more bleeding, and after discussion with GI, it was decided to  discharge her to continue the Coumadin as the benefits outweigh the risks at  this time.  She will follow up with her primary care physician in the next 2-  3 weeks.   FOLLOW UP:  Primary care physician in 2-3 weeks.      Margaretmary Dys, M.D.  Electronically Signed     AM/MEDQ  D:  11/08/2005  T:  11/08/2005  Job:  [id number]
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
## 2010-10-26 NOTE — Op Note (Signed)
NAMETERIE, LEAR              ACCOUNT NO.:  [id number]   MEDICAL RECORD NO.:  [id number]          PATIENT TYPE:  INP   LOCATION:  A215                          FACILITY:  [facility]   PHYSICIAN:  R. Roetta Sessions, M.D. DATE OF BIRTH:  [birth year]   DATE OF PROCEDURE:  09/20/2005  DATE OF DISCHARGE:  09/21/2005                                 OPERATIVE REPORT   PROCEDURE:  Small-bowel capsule study.   INDICATIONS FOR PROCEDURE:  An elderly female admitted to the hospital with  reported outpatient symptoms of melena.  She has had a multigram drop in her  hemoglobin, although she is Hemoccult-negative during her hospitalization  here.  Essentially negative EGD and colonoscopy.   Her husband reports significant episodes of epistaxis last week.  A small-  bowel capsule study is performed.   FINDINGS:  First gastric image obtained at 1 minute 19 seconds into the  study.  First duodenal image is obtained at 3 minutes 8 seconds.  First  cecal image obtained at 4 minutes 14 seconds into the study.   Small bowel mucosa appeared normal except for two possible nonbleeding AVMs.   RECOMMENDATIONS:  I doubt this lady has experienced significant GI bleeding  recently.  More likely, epistaxis could have produced melena via mechanism  of swallowing blood.   No further GI evaluation is warranted unless the patient has GI symptoms.      Jonathon Bellows, M.D.  Electronically Signed     RMR/MEDQ  D:  09/23/2005  T:  09/24/2005  Job:  [id number]   cc:   Hospitalist Service

## 2010-11-22 ENCOUNTER — Encounter (INDEPENDENT_AMBULATORY_CARE_PROVIDER_SITE_OTHER): Payer: Self-pay | Admitting: General Surgery

## 2010-12-06 ENCOUNTER — Telehealth: Payer: Self-pay | Admitting: Cardiovascular Disease

## 2010-12-06 NOTE — Telephone Encounter (Signed)
Pt went to primary MD expressing concerns regarding red bruises on body. MD advise pt to see Dr. Excell Seltzer. Pt stated Dr. Excell Seltzer took her off coumadin after 7 years. Pt wanted to hear what Dr. Excell Seltzer would advise, if an appointment is necessary.

## 2010-12-06 NOTE — Telephone Encounter (Signed)
Called patient back. She advised me that when she was seen by Dr.Wakefield for a surgical workup about 2 months ago the issue of Coumadin came up. She states that Dr.Copper happened to be outside the room and joined in on a discussion and advised her to come off Coumadin and just take [place] every day. She is now complaining of bruises on her arms for 2 months even though she is not taking Coumadin any longer. She states that her primary care doctor told her to call Dr.Copper and discuss this with him. She states that she actually is not very worried about the bruising. She states that her big issue is irregular and fast heart beats on and off that she notices when she is resting at night for the past 2 months. She would like to make an appointment to see Dr.Cooper. Advised her that we would ask him if he could see her and call her back next week. She has seen Dr.Nishan and Dr.Hochrein in the past.

## 2010-12-07 ENCOUNTER — Encounter: Payer: Self-pay | Admitting: Cardiology

## 2010-12-07 ENCOUNTER — Telehealth: Payer: Self-pay | Admitting: Internal Medicine

## 2010-12-07 NOTE — Telephone Encounter (Signed)
Pt may need a spinal stimulator Dr. Eduard Clos wants to know if she can have this done to help get feeling back in her legs

## 2010-12-07 NOTE — Telephone Encounter (Signed)
Called patient back and advised her that Dr.Cooper has not seen her in the past. Advised her that Dr.Nishan noted that she was to follow up with Dr.Hochrein in [place]. He did not have any openings until mid August so I asked her to come to the [place] office on 7/2 at 2 pm. She agreed to this appointment.

## 2010-12-07 NOTE — Telephone Encounter (Signed)
Pt calling returning call to [facility] to schedule appt with Dr. Excell Seltzer.

## 2010-12-07 NOTE — Telephone Encounter (Signed)
Pt returned your call regarding her appt.

## 2010-12-07 NOTE — Telephone Encounter (Signed)
I don't know this patient. Reviewed her chart and Dr Eden Emms advised her to stop coumadin and start ASA earlier this year.

## 2010-12-07 NOTE — Telephone Encounter (Signed)
LMOM for call back to set up an appointment.

## 2010-12-10 ENCOUNTER — Encounter: Payer: Self-pay | Admitting: Cardiology

## 2010-12-10 ENCOUNTER — Ambulatory Visit (INDEPENDENT_AMBULATORY_CARE_PROVIDER_SITE_OTHER): Payer: Medicaid Other | Admitting: Cardiology

## 2010-12-10 DIAGNOSIS — R9389 Abnormal findings on diagnostic imaging of other specified body structures: Secondary | ICD-10-CM

## 2010-12-10 DIAGNOSIS — R002 Palpitations: Secondary | ICD-10-CM

## 2010-12-10 DIAGNOSIS — R072 Precordial pain: Secondary | ICD-10-CM

## 2010-12-10 DIAGNOSIS — E785 Hyperlipidemia, unspecified: Secondary | ICD-10-CM

## 2010-12-10 DIAGNOSIS — I10 Essential (primary) hypertension: Secondary | ICD-10-CM

## 2010-12-10 DIAGNOSIS — R918 Other nonspecific abnormal finding of lung field: Secondary | ICD-10-CM

## 2010-12-10 NOTE — Progress Notes (Signed)
HPI The patient presents for evaluation of an abnormal chest x-ray suggesting cardiomyopathy. However, I evaluated a recent echo and nuclear study this year and she was not found to have cardiomyopathy, valvular abnormalities or LVH. She had a normal EF and no evidence of ischemia. She was cleared for gallbladder surgery in February after the studies but never had this surgery. She does report palpitations. She describes a heartbeat when she lies on her left side. She feels it in her left posterior chest. She's not had presyncope or syncope. She is not describing any chest pressure, neck or arm discomfort. She's had no weight gain or edema.  She reports feeling these palps every 3 - 4 four days.  Allergies  Allergen Reactions  . Codeine     REACTION: Reaction not known  . Latex   . Penicillins   . Procaine Hcl     Current Outpatient Prescriptions  Medication Sig Dispense Refill  . amitriptyline (ELAVIL) 25 MG tablet Take 25 mg by mouth at bedtime.        . Choline Fenofibrate (TRILIPIX) 135 MG capsule Take 135 mg by mouth daily.        . diazepam (VALIUM) 10 MG tablet Take 10 mg by mouth every 6 (six) hours as needed.        Marland Kitchen escitalopram (LEXAPRO) 20 MG tablet Take 20 mg by mouth daily.        . furosemide (LASIX) 40 MG tablet Take 40 mg by mouth daily.       Marland Kitchen glimepiride (AMARYL) 1 MG tablet Take 1 mg by mouth daily before breakfast.        . levothyroxine (SYNTHROID, LEVOTHROID) 75 MCG tablet Take 75 mcg by mouth daily.        . metFORMIN (GLUCOPHAGE) 500 MG tablet Take 500 mg by mouth as directed.       . metoprolol succinate (TOPROL-XL) 25 MG 24 hr tablet Take 25 mg by mouth daily.        . Multiple Vitamin (VITAMIN E/FOLIC ACID/B-6/B-12 PO) Take by mouth.        . potassium chloride SA (K-DUR,KLOR-CON) 20 MEQ tablet Take 20 mEq by mouth daily.        . simvastatin (ZOCOR) 40 MG tablet Take 40 mg by mouth at bedtime.        . traMADol (ULTRAM) 50 MG tablet Take 50 mg by mouth every 6  (six) hours as needed.        Marland Kitchen DISCONTD: levothyroxine (SYNTHROID, LEVOTHROID) 50 MCG tablet Take 50 mcg by mouth daily.       Marland Kitchen DISCONTD: potassium chloride 40 MEQ/15ML (20%) LIQD Take by mouth.          Past Medical History  Diagnosis Date  . Hypertension   . Diabetes mellitus   . Arthritis   . Blood transfusion   . Hx pulmonary embolism   . Anemia   . COPD (chronic obstructive pulmonary disease)   . Depression   . Anxiety   . Gastroesophageal reflux disease   . Osteoporosis   . DDD (degenerative disc disease)   . Uterine prolapse   . Uterine prolapse     Past Surgical History  Procedure Date  . Hiatel hernia repair   . Tubal ligation     ROS:  As stated in the HPI and negative for all other systems.  PHYSICAL EXAM BP 122/60  Pulse 60  Resp 16  Ht 5\' 2"  (1.575 m)  Wt  182 lb (82.555 kg)  BMI 33.29 kg/m2 GENERAL:  Well appearing HEENT:  Pupils equal round and reactive, fundi not visualized, oral mucosa unremarkable NECK:  No jugular venous distention, waveform within normal limits, carotid upstroke brisk and symmetric, no bruits, no thyromegaly LYMPHATICS:  No cervical, inguinal adenopathy LUNGS:  Clear to auscultation bilaterally BACK:  No CVA tenderness CHEST:  Unremarkable HEART:  PMI not displaced or sustained,S1 and S2 within normal limits, no S3, no S4, no clicks, no rubs, no murmurs ABD:  Flat, positive bowel sounds normal in frequency in pitch, no bruits, no rebound, no guarding, no midline pulsatile mass, no hepatomegaly, no splenomegaly EXT:  2 plus pulses throughout, no edema, no cyanosis no clubbing SKIN:  No rashes no nodules NEURO:  Cranial nerves II through XII grossly intact, motor grossly intact throughout PSYCH:  Cognitively intact, oriented to person place and time  EKG:  Sinus rhythm, rate 60, axis within normal limits, intervals within normal limits, no acute ST-T wave changes.    ASSESSMENT AND PLAN

## 2010-12-10 NOTE — Assessment & Plan Note (Signed)
The blood pressure is at target. No change in medications is indicated. We will continue with therapeutic lifestyle changes (TLC).  

## 2010-12-10 NOTE — Assessment & Plan Note (Signed)
I will apply a 21 day event monitor.

## 2010-12-10 NOTE — Assessment & Plan Note (Signed)
I assured her that, althought the CXR suggested cardiomegaly, that she does not have this per echo this year.  I do not suspect a new effusion.  No further testing is indicated.

## 2010-12-10 NOTE — Assessment & Plan Note (Signed)
She had a negative stress test.  No further testing is indicated.

## 2010-12-10 NOTE — Patient Instructions (Signed)
You are being scheduled for a 21 day event monitor for palpitations Please continue medications as listed Follow up will be as needed

## 2010-12-11 ENCOUNTER — Telehealth: Payer: Self-pay

## 2010-12-13 NOTE — Telephone Encounter (Signed)
She has most recently been seen by Dr Antoine Poche.  I will defer this to him.

## 2010-12-14 NOTE — Telephone Encounter (Signed)
She is OK to have the necessary procedure.

## 2010-12-14 NOTE — Telephone Encounter (Signed)
Pt aware ok to have procedure

## 2010-12-17 ENCOUNTER — Telehealth: Payer: Self-pay

## 2010-12-17 NOTE — Telephone Encounter (Signed)
Patient would like the monitor mail out to her home and [employer] will take care of request.

## 2010-12-17 NOTE — Telephone Encounter (Signed)
Monitor will be mail to patient.

## 2011-01-02 ENCOUNTER — Telehealth: Payer: Self-pay | Admitting: Cardiovascular Disease

## 2011-01-02 NOTE — Telephone Encounter (Signed)
Pt states she is going to stop using her heart monitor. Pt states she does not need it anymore. Pt will send or bring the monitor back when she gets a chance.

## 2011-01-02 NOTE — Telephone Encounter (Signed)
Spoke with pt, she has had no symptoms while wearing the monitor. She is getting a heat rash and sweats a lot and is tired of having to change out the electrodes. She will mail the monitor back [name]

## 2011-03-20 LAB — DIFFERENTIAL
Eosinophils Absolute: 0.1
Eosinophils Relative: 1
Lymphs Abs: 1.2
Monocytes Relative: 6

## 2011-03-20 LAB — CBC
HCT: 37.4
MCV: 88.8
RBC: 4.21
WBC: 9.3

## 2011-03-20 LAB — BASIC METABOLIC PANEL
BUN: 14
Chloride: 100
GFR calc Af Amer: >60
Potassium: 3.9

## 2011-03-20 LAB — ETHANOL: Alcohol, Ethyl (B): <5

## 2011-03-25 LAB — BASIC METABOLIC PANEL
CO2: 33 — ABNORMAL HIGH
Calcium: 9.1
Creatinine, Ser: 0.91
Glucose, Bld: 166 — ABNORMAL HIGH

## 2011-03-25 LAB — PROTIME-INR
INR: 2.2 — ABNORMAL HIGH
Prothrombin Time: 25.6 — ABNORMAL HIGH

## 2011-03-25 LAB — CBC
Hemoglobin: 12.6
MCHC: 33.8
RDW: 14.5 — ABNORMAL HIGH

## 2011-04-12 ENCOUNTER — Emergency Department (HOSPITAL_COMMUNITY): Payer: Medicaid Other

## 2011-04-12 ENCOUNTER — Encounter (HOSPITAL_COMMUNITY): Payer: Self-pay | Admitting: *Deleted

## 2011-04-12 ENCOUNTER — Emergency Department (HOSPITAL_COMMUNITY)
Admission: EM | Admit: 2011-04-12 | Discharge: 2011-04-12 | Disposition: A | Discharge status: Home / Self Care | Payer: Medicaid Other | Attending: Emergency Medicine | Admitting: Emergency Medicine

## 2011-04-12 ENCOUNTER — Other Ambulatory Visit: Payer: Self-pay

## 2011-04-12 DIAGNOSIS — F3289 Other specified depressive episodes: Secondary | ICD-10-CM | POA: Insufficient documentation

## 2011-04-12 DIAGNOSIS — K219 Gastro-esophageal reflux disease without esophagitis: Secondary | ICD-10-CM | POA: Insufficient documentation

## 2011-04-12 DIAGNOSIS — F411 Generalized anxiety disorder: Secondary | ICD-10-CM | POA: Insufficient documentation

## 2011-04-12 DIAGNOSIS — Z87891 Personal history of nicotine dependence: Secondary | ICD-10-CM | POA: Insufficient documentation

## 2011-04-12 DIAGNOSIS — R1013 Epigastric pain: Secondary | ICD-10-CM

## 2011-04-12 DIAGNOSIS — I498 Other specified cardiac arrhythmias: Secondary | ICD-10-CM | POA: Insufficient documentation

## 2011-04-12 DIAGNOSIS — Z86718 Personal history of other venous thrombosis and embolism: Secondary | ICD-10-CM | POA: Insufficient documentation

## 2011-04-12 DIAGNOSIS — F329 Major depressive disorder, single episode, unspecified: Secondary | ICD-10-CM | POA: Insufficient documentation

## 2011-04-12 DIAGNOSIS — N814 Uterovaginal prolapse, unspecified: Secondary | ICD-10-CM | POA: Insufficient documentation

## 2011-04-12 DIAGNOSIS — M129 Arthropathy, unspecified: Secondary | ICD-10-CM | POA: Insufficient documentation

## 2011-04-12 DIAGNOSIS — IMO0002 Reserved for concepts with insufficient information to code with codable children: Secondary | ICD-10-CM | POA: Insufficient documentation

## 2011-04-12 DIAGNOSIS — R0602 Shortness of breath: Secondary | ICD-10-CM | POA: Insufficient documentation

## 2011-04-12 DIAGNOSIS — Z862 Personal history of diseases of the blood and blood-forming organs and certain disorders involving the immune mechanism: Secondary | ICD-10-CM | POA: Insufficient documentation

## 2011-04-12 DIAGNOSIS — R079 Chest pain, unspecified: Secondary | ICD-10-CM | POA: Insufficient documentation

## 2011-04-12 DIAGNOSIS — E119 Type 2 diabetes mellitus without complications: Secondary | ICD-10-CM | POA: Insufficient documentation

## 2011-04-12 DIAGNOSIS — J438 Other emphysema: Secondary | ICD-10-CM | POA: Insufficient documentation

## 2011-04-12 DIAGNOSIS — I10 Essential (primary) hypertension: Secondary | ICD-10-CM | POA: Insufficient documentation

## 2011-04-12 DIAGNOSIS — M810 Age-related osteoporosis without current pathological fracture: Secondary | ICD-10-CM | POA: Insufficient documentation

## 2011-04-12 LAB — LIPASE, BLOOD: Lipase: 42 U/L (ref 11–59)

## 2011-04-12 LAB — CBC
HCT: 41.6 % (ref 36.0–46.0)
MCV: 91.2 fL (ref 78.0–100.0)
RDW: 14.2 % (ref 11.5–15.5)
WBC: 6.4 10*3/uL (ref 4.0–10.5)

## 2011-04-12 LAB — COMPREHENSIVE METABOLIC PANEL
AST: 50 U/L — ABNORMAL HIGH (ref 0–37)
BUN: 19 mg/dL (ref 6–23)
CO2: 30 mEq/L (ref 19–32)
Calcium: 10.1 mg/dL (ref 8.4–10.5)
Creatinine, Ser: 1.09 mg/dL (ref 0.50–1.10)
GFR calc Af Amer: 61 mL/min — ABNORMAL LOW (ref >90)
GFR calc non Af Amer: 53 mL/min — ABNORMAL LOW (ref >90)
Glucose, Bld: 73 mg/dL (ref 70–99)

## 2011-04-12 LAB — CARDIAC PANEL(CRET KIN+CKTOT+MB+TROPI): Total CK: 69 U/L (ref 7–177)

## 2011-04-12 LAB — POCT I-STAT TROPONIN I: Troponin i, poc: 0 ng/mL (ref 0.00–0.08)

## 2011-04-12 LAB — DIFFERENTIAL
Basophils Absolute: 0 10*3/uL (ref 0.0–0.1)
Eosinophils Relative: 2 % (ref 0–5)
Lymphocytes Relative: 29 % (ref 12–46)
Monocytes Absolute: 0.6 10*3/uL (ref 0.1–1.0)
Monocytes Relative: 9 % (ref 3–12)

## 2011-04-12 LAB — GLUCOSE, CAPILLARY: Glucose-Capillary: 76 mg/dL (ref 70–99)

## 2011-04-12 MED ORDER — ONDANSETRON HCL 4 MG PO TABS: 4.0000 mg | ORAL_TABLET | Freq: Four times a day (QID) | ORAL | Status: AC

## 2011-04-12 MED ORDER — HYDROCODONE-ACETAMINOPHEN 5-325 MG PO TABS: 1.0000 | ORAL_TABLET | ORAL | Status: AC | PRN

## 2011-04-12 NOTE — ED Provider Notes (Addendum)
History   This chart was scribed for Donnetta Hutching, MD by Clarita Crane. The patient was seen in room APA09/APA09 and the patient's care was started at 1:55PM.   CSN: [id number] Arrival date & time: 04/12/2011  1:12 PM   First MD Initiated Contact with Patient 04/12/11 1327      Chief Complaint  Patient presents with  . Chest Pain  . Shortness of Breath   HPI TIHANNA GOODSON is a 65 y.o. female who presents to the Emergency Department complaining of constant mild to moderate pain to epigastric region radiating to mid back with associated SOB onset this morning and persistent since. Patient states she experiences SOB at baseline but has been gradually worsening for 1 month. Notes SOB is not worse with exertion. Patient with h/o COPD, cardiomegaly. Patient notes she had a cardiac stress test performed in February 2012 with normal results. Has had cholecystectomy  PCP- [facility] Surgeon- Dr. Dwain Sarna in [facility] Cardiologist- Dr. Theodoro Clock  Past Medical History  Diagnosis Date  . Hypertension   . Diabetes mellitus   . Arthritis   . Blood transfusion   . Hx pulmonary embolism   . Anemia   . COPD (chronic obstructive pulmonary disease)   . Depression   . Anxiety   . Gastroesophageal reflux disease   . Osteoporosis   . DDD (degenerative disc disease)   . Uterine prolapse   . Uterine prolapse     Past Surgical History  Procedure Date  . Hiatel hernia repair   . Tubal ligation     Family History  Problem Relation Age of Onset  . Heart disease Mother   . Stroke Mother   . Prostate cancer Father   . Liver cancer Brother   . Alzheimer's disease      family history    History  Substance Use Topics  . Smoking status: Former Games developer  . Smokeless tobacco: Not on file   Comment: quit in 1996  . Alcohol Use: No    OB History    Grav Para Term Preterm Abortions TAB SAB Ect Mult Living                  Review of Systems 10 Systems reviewed and are negative for acute  change except as noted in the HPI.  Allergies  Codeine; Latex; Penicillins; and Procaine hcl  Home Medications   Current Outpatient Rx  Name Route Sig Dispense Refill  . AMITRIPTYLINE HCL 25 MG PO TABS Oral Take 25 mg by mouth at bedtime.      . CHOLINE FENOFIBRATE 135 MG PO CPDR Oral Take 135 mg by mouth daily.      Marland Kitchen DIAZEPAM 10 MG PO TABS Oral Take 10 mg by mouth every 6 (six) hours as needed.      Marland Kitchen ESCITALOPRAM OXALATE 20 MG PO TABS Oral Take 20 mg by mouth daily.      . FUROSEMIDE 40 MG PO TABS Oral Take 40 mg by mouth daily.     Marland Kitchen GLIMEPIRIDE 1 MG PO TABS Oral Take 1 mg by mouth daily before breakfast.      . LEVOTHYROXINE SODIUM 75 MCG PO TABS Oral Take 75 mcg by mouth daily.      Marland Kitchen METFORMIN HCL 500 MG PO TABS Oral Take 500 mg by mouth as directed.     Marland Kitchen METOPROLOL SUCCINATE 25 MG PO TB24 Oral Take 25 mg by mouth daily.      Marland Kitchen VITAMIN E/FOLIC ACID/B-6/B-12  PO Oral Take by mouth.      Marland Kitchen POTASSIUM CHLORIDE CRYS CR 20 MEQ PO TBCR Oral Take 20 mEq by mouth daily.      Marland Kitchen SIMVASTATIN 40 MG PO TABS Oral Take 40 mg by mouth at bedtime.      . TRAMADOL HCL 50 MG PO TABS Oral Take 50 mg by mouth every 6 (six) hours as needed.        BP 116/62  Pulse 75  Temp(Src) 98.3 F (36.8 C) (Oral)  Resp 20  Ht 5\' 5"  (1.651 m)  Wt 200 lb (90.719 kg)  BMI 33.28 kg/m2  SpO2 95%  Physical Exam  Nursing note and vitals reviewed. Constitutional: She is oriented to person, place, and time. She appears well-developed and well-nourished. No distress.       Overweight.   HENT:  Head: Normocephalic and atraumatic.  Eyes: EOM are normal. Pupils are equal, round, and reactive to light.  Neck: Neck supple. No tracheal deviation present.  Cardiovascular: Normal rate and regular rhythm.   No murmur heard. Pulmonary/Chest: Effort normal. No respiratory distress.  Abdominal: Soft. She exhibits no distension. There is tenderness (mild) in the epigastric area.  Musculoskeletal: Normal range of motion.  She exhibits no edema.  Neurological: She is alert and oriented to person, place, and time. No sensory deficit.  Skin: Skin is warm and dry.  Psychiatric: She has a normal mood and affect. Her behavior is normal.    ED Course  Procedures (including critical care time)  DIAGNOSTIC STUDIES: Oxygen Saturation is 95% on room air, adequate by my interpretation.    COORDINATION OF CARE: 2:05PM- Will obtain Lipase, CBC and cardiac enzyme labs.     Labs Reviewed  GLUCOSE, CAPILLARY  CBC  DIFFERENTIAL  COMPREHENSIVE METABOLIC PANEL  CARDIAC PANEL(CRET KIN+CKTOT+MB+TROPI)   Dg Chest Portable 1 View  04/12/2011  *RADIOLOGY REPORT*  Clinical Data: Chest pain.  Palpitations.  Hypertension.  COPD. Shortness of breath.  PORTABLE CHEST - 1 VIEW  Comparison: 07/26/2010  Findings: Mildly prominent epicardial fat pad noted.  Heart size is within normal limits.  A calcified granuloma in the upper portion the right middle lobe is visible.  The lungs appear otherwise clear.  No pleural effusion is observed.  IMPRESSION:  1.  Calcified granuloma in the upper portion the right middle lobe. Otherwise, no significant abnormality identified.  Original Report Authenticated By: Dellia Cloud, M.D.   Date: 04/12/2011  Rate:55  Rhythm: sinus bradycardia  QRS Axis: normal  Intervals: normal  ST/T Wave abnormalities: normal  Conduction Disutrbances:none  Narrative Interpretation:   Old EKG Reviewed: none available    No diagnosis found.    MDM  Patient has epigastric pain with radiation to back. Patient is nontoxic. Will obtain screening tests. Screening tests were normal. No acute abdomen.          Donnetta Hutching, MD 04/12/11 1541  Donnetta Hutching, MD 04/12/11 [phone]

## 2011-04-12 NOTE — ED Notes (Signed)
Pt c/o cp and pain in her lungs and sob

## 2011-04-12 NOTE — ED Provider Notes (Signed)
  Physical Exam  BP 114/45  Pulse 50  Temp(Src) 98.3 F (36.8 C) (Oral)  Resp 20  Ht 5\' 5"  (1.651 m)  Wt 200 lb (90.719 kg)  BMI 33.28 kg/m2  SpO2 96%  Physical Exam  ED Course  Procedures  MDM Pt reports has not had GB removed.  Pt is feeling improved at present.  I spoke with and discussed pt with Dr. Adriana Simas.  No sig suspicion for CAD.  No sweating or vomiting.  Will check additional troponin and obtain U/S.  Pt's LFT's are ok, minimally elevated only.  If negative tests, will d/c home and pt understands can follow up with PCP.        Gavin Pound. Tyresha Fede, MD 04/17/11 2352

## 2011-06-27 ENCOUNTER — Encounter (INDEPENDENT_AMBULATORY_CARE_PROVIDER_SITE_OTHER): Payer: Self-pay | Admitting: General Surgery

## 2011-07-15 ENCOUNTER — Ambulatory Visit (INDEPENDENT_AMBULATORY_CARE_PROVIDER_SITE_OTHER): Payer: Medicaid Other | Admitting: General Surgery

## 2011-07-15 ENCOUNTER — Encounter (INDEPENDENT_AMBULATORY_CARE_PROVIDER_SITE_OTHER): Payer: Self-pay | Admitting: General Surgery

## 2011-07-15 VITALS — BP 132/80 | HR 80 | Temp 97.6°F | Resp 18 | Ht 62.0 in | Wt 178.6 lb

## 2011-07-15 DIAGNOSIS — R0781 Pleurodynia: Secondary | ICD-10-CM

## 2011-07-15 DIAGNOSIS — R079 Chest pain, unspecified: Secondary | ICD-10-CM

## 2011-07-15 NOTE — Progress Notes (Signed)
Subjective:     Patient ID: [name], female   DOB: 09-27-1945, 66 y.o.   MRN: [id number]  HPI This is a 66 year old female who I have seen since February 2012. When I first saw her she stated she felt a knot in her right upper quadrant that always hurt. I saw her at that point to discuss a cholecystectomy. Her ultrasound at that point showed what was either a very small polyp or small stone. I was not convinced that her symptoms were referable to her gallbladder at that point. I saw her again and we discussed a cholecystectomy. I told her that at best there was a 50-50 chance of relieving her symptoms and she decided that she would not like to undergo surgery and I was agreeable to that. Since then she recently has had a cutting pain in her right upper quadrant which is made worse with movement and bending over. This has no relation to eating at all. She cannot lie on her right side as it is tender. It occurs daily with movement and last for about 5-6 minutes then passes when she rests. She is having bowel movements has no trouble with any emesis or any changes in her diet at all. She recently has an ultrasound with the results listed below which showed possible sludge but is otherwise normal. She previously had a HIDA scan that was completely normal and did not reproduce her pain. She comes in today to ask if a cholecystectomy won't relieve the pain she is having currently.  Review of Systems    COMPLETE ABDOMINAL ULTRASOUND  Comparison: None.  Findings:  Gallbladder: Possible layering sludge. No definite gallstones.  No associated gallbladder wall thickening or pericholecystic fluid.  Negative sonographic Murphy's sign.  Common bile duct: Measures 3 mm.  Liver: Coarse, hyperechoic hepatic parenchyma, suggesting hepatic  steatosis.  IVC: Appears normal.  Pancreas: Incompletely visualized but grossly unremarkable.  Spleen: Measures 8.0 cm. Suspected calcified granulomata.  Right Kidney:  Measures 9.0 cm. No mass or hydronephrosis.  Left Kidney: Measures 10.3 cm. No mass or hydronephrosis.  Abdominal aorta: No aneurysm identified.  IMPRESSION:  Possible gallbladder sludge. No cholelithiasis or findings to  suggest acute cholecystitis.  Hepatic steatosis.  Objective:   Physical Exam  Vitals reviewed. Eyes: No scleral icterus.  Abdominal: Normal appearance and bowel sounds are normal. There is tenderness.         Assessment:     Right sided rib pain    Plan:     After talking with her today and examining her, I do not think her gallbladder is the source of this current pain. Her last ultrasound is essentially normal except for some possible sludge. She had no abnormalities in her liver function tests that I saw when she was being seen either. I think this is clearly musculoskeletal on her history it appears that way on her exam also. I recommended not removing her gallbladder although I did tell her that this is a possibility over the long-term. She is in to see her regular doctor about this.

## 2011-07-18 ENCOUNTER — Emergency Department (HOSPITAL_COMMUNITY): Payer: Medicaid Other

## 2011-07-18 ENCOUNTER — Other Ambulatory Visit: Payer: Self-pay

## 2011-07-18 ENCOUNTER — Encounter (HOSPITAL_COMMUNITY): Payer: Self-pay | Admitting: Emergency Medicine

## 2011-07-18 ENCOUNTER — Inpatient Hospital Stay (HOSPITAL_COMMUNITY)
Admission: EM | Admit: 2011-07-18 | Discharge: 2011-07-20 | DRG: 191 | Disposition: A | Discharge status: Home / Self Care | Payer: Medicaid Other | Attending: Internal Medicine | Admitting: Internal Medicine

## 2011-07-18 DIAGNOSIS — K219 Gastro-esophageal reflux disease without esophagitis: Secondary | ICD-10-CM | POA: Diagnosis present

## 2011-07-18 DIAGNOSIS — E119 Type 2 diabetes mellitus without complications: Secondary | ICD-10-CM | POA: Diagnosis present

## 2011-07-18 DIAGNOSIS — Z86711 Personal history of pulmonary embolism: Secondary | ICD-10-CM

## 2011-07-18 DIAGNOSIS — R9389 Abnormal findings on diagnostic imaging of other specified body structures: Secondary | ICD-10-CM

## 2011-07-18 DIAGNOSIS — R072 Precordial pain: Secondary | ICD-10-CM

## 2011-07-18 DIAGNOSIS — IMO0002 Reserved for concepts with insufficient information to code with codable children: Secondary | ICD-10-CM | POA: Diagnosis present

## 2011-07-18 DIAGNOSIS — M129 Arthropathy, unspecified: Secondary | ICD-10-CM | POA: Diagnosis present

## 2011-07-18 DIAGNOSIS — Z7982 Long term (current) use of aspirin: Secondary | ICD-10-CM

## 2011-07-18 DIAGNOSIS — J449 Chronic obstructive pulmonary disease, unspecified: Secondary | ICD-10-CM

## 2011-07-18 DIAGNOSIS — D649 Anemia, unspecified: Secondary | ICD-10-CM

## 2011-07-18 DIAGNOSIS — F3289 Other specified depressive episodes: Secondary | ICD-10-CM | POA: Diagnosis present

## 2011-07-18 DIAGNOSIS — J441 Chronic obstructive pulmonary disease with (acute) exacerbation: Principal | ICD-10-CM | POA: Diagnosis present

## 2011-07-18 DIAGNOSIS — R0902 Hypoxemia: Secondary | ICD-10-CM

## 2011-07-18 DIAGNOSIS — R002 Palpitations: Secondary | ICD-10-CM

## 2011-07-18 DIAGNOSIS — E039 Hypothyroidism, unspecified: Secondary | ICD-10-CM | POA: Diagnosis present

## 2011-07-18 DIAGNOSIS — F411 Generalized anxiety disorder: Secondary | ICD-10-CM | POA: Diagnosis present

## 2011-07-18 DIAGNOSIS — F329 Major depressive disorder, single episode, unspecified: Secondary | ICD-10-CM | POA: Diagnosis present

## 2011-07-18 DIAGNOSIS — M810 Age-related osteoporosis without current pathological fracture: Secondary | ICD-10-CM | POA: Diagnosis present

## 2011-07-18 DIAGNOSIS — J4489 Other specified chronic obstructive pulmonary disease: Secondary | ICD-10-CM

## 2011-07-18 DIAGNOSIS — I509 Heart failure, unspecified: Secondary | ICD-10-CM | POA: Diagnosis present

## 2011-07-18 DIAGNOSIS — I2699 Other pulmonary embolism without acute cor pulmonale: Secondary | ICD-10-CM

## 2011-07-18 DIAGNOSIS — E860 Dehydration: Secondary | ICD-10-CM | POA: Diagnosis present

## 2011-07-18 DIAGNOSIS — E785 Hyperlipidemia, unspecified: Secondary | ICD-10-CM

## 2011-07-18 DIAGNOSIS — I10 Essential (primary) hypertension: Secondary | ICD-10-CM | POA: Diagnosis present

## 2011-07-18 DIAGNOSIS — N179 Acute kidney failure, unspecified: Secondary | ICD-10-CM | POA: Diagnosis present

## 2011-07-18 LAB — BASIC METABOLIC PANEL
Chloride: 98 mEq/L (ref 96–112)
GFR calc Af Amer: 43 mL/min — ABNORMAL LOW (ref >90)
Potassium: 4.1 mEq/L (ref 3.5–5.1)

## 2011-07-18 LAB — CBC
Hemoglobin: 12.3 g/dL (ref 12.0–15.0)
MCHC: 32.1 g/dL (ref 30.0–36.0)
RDW: 15.1 % (ref 11.5–15.5)
WBC: 7.3 10*3/uL (ref 4.0–10.5)

## 2011-07-18 LAB — POCT I-STAT TROPONIN I: Troponin i, poc: 0.04 ng/mL (ref 0.00–0.08)

## 2011-07-18 LAB — PROTIME-INR
INR: 1.15 (ref 0.00–1.49)
Prothrombin Time: 14.9 seconds (ref 11.6–15.2)

## 2011-07-18 LAB — DIFFERENTIAL
Basophils Absolute: 0 10*3/uL (ref 0.0–0.1)
Basophils Relative: 0 % (ref 0–1)
Neutro Abs: 5 10*3/uL (ref 1.7–7.7)
Neutrophils Relative %: 69 % (ref 43–77)

## 2011-07-18 LAB — PRO B NATRIURETIC PEPTIDE: Pro B Natriuretic peptide (BNP): 4300 pg/mL — ABNORMAL HIGH (ref 0–125)

## 2011-07-18 MED ORDER — FENOFIBRATE 160 MG PO TABS
160.0000 mg | ORAL_TABLET | Freq: Every day | ORAL | Status: DC
  Administered 2011-07-19: 160 mg via ORAL
  Filled 2011-07-18 (×4): qty 1

## 2011-07-18 MED ORDER — METHYLPREDNISOLONE SODIUM SUCC 125 MG IJ SOLR
125.0000 mg | Freq: Four times a day (QID) | INTRAMUSCULAR | Status: DC
  Administered 2011-07-18 – 2011-07-19 (×3): 125 mg via INTRAVENOUS
  Filled 2011-07-18 (×3): qty 2

## 2011-07-18 MED ORDER — METHYLPREDNISOLONE SODIUM SUCC 125 MG IJ SOLR
125.0000 mg | Freq: Once | INTRAMUSCULAR | Status: AC
  Administered 2011-07-18: 125 mg via INTRAVENOUS
  Filled 2011-07-18: qty 2

## 2011-07-18 MED ORDER — METOPROLOL SUCCINATE ER 25 MG PO TB24
25.0000 mg | ORAL_TABLET | Freq: Every day | ORAL | Status: DC
  Administered 2011-07-19: 25 mg via ORAL
  Filled 2011-07-18 (×3): qty 1

## 2011-07-18 MED ORDER — ALBUTEROL SULFATE (5 MG/ML) 0.5% IN NEBU
5.0000 mg | INHALATION_SOLUTION | Freq: Once | RESPIRATORY_TRACT | Status: AC
  Administered 2011-07-18: 5 mg via RESPIRATORY_TRACT
  Filled 2011-07-18: qty 1

## 2011-07-18 MED ORDER — FENOFIBRATE 54 MG PO TABS: 54.0000 mg | ORAL_TABLET | Freq: Every day | ORAL | Status: DC

## 2011-07-18 MED ORDER — GLIMEPIRIDE 2 MG PO TABS
1.0000 mg | ORAL_TABLET | Freq: Every day | ORAL | Status: DC
  Administered 2011-07-19: 1 mg via ORAL
  Filled 2011-07-18 (×3): qty 1

## 2011-07-18 MED ORDER — ENOXAPARIN SODIUM 40 MG/0.4ML ~~LOC~~ SOLN
40.0000 mg | SUBCUTANEOUS | Status: DC
  Administered 2011-07-18 – 2011-07-19 (×2): 40 mg via SUBCUTANEOUS
  Filled 2011-07-18 (×2): qty 0.4

## 2011-07-18 MED ORDER — LEVOTHYROXINE SODIUM 75 MCG PO TABS
75.0000 ug | ORAL_TABLET | Freq: Every day | ORAL | Status: DC
  Administered 2011-07-19 – 2011-07-20 (×2): 75 ug via ORAL
  Filled 2011-07-18 (×4): qty 1

## 2011-07-18 MED ORDER — SULFAMETHOXAZOLE-TMP DS 800-160 MG PO TABS
1.0000 | ORAL_TABLET | Freq: Two times a day (BID) | ORAL | Status: DC
  Administered 2011-07-18 – 2011-07-19 (×2): 1 via ORAL
  Filled 2011-07-18 (×2): qty 1

## 2011-07-18 MED ORDER — ALBUTEROL SULFATE (5 MG/ML) 0.5% IN NEBU
2.5000 mg | INHALATION_SOLUTION | RESPIRATORY_TRACT | Status: DC
  Administered 2011-07-18 – 2011-07-19 (×7): 2.5 mg via RESPIRATORY_TRACT
  Filled 2011-07-18 (×9): qty 0.5

## 2011-07-18 MED ORDER — ESCITALOPRAM OXALATE 10 MG PO TABS
20.0000 mg | ORAL_TABLET | Freq: Every day | ORAL | Status: DC
  Administered 2011-07-19: 20 mg via ORAL
  Filled 2011-07-18 (×2): qty 1
  Filled 2011-07-18: qty 2

## 2011-07-18 MED ORDER — AMITRIPTYLINE HCL 25 MG PO TABS
25.0000 mg | ORAL_TABLET | Freq: Every day | ORAL | Status: DC
  Administered 2011-07-18 – 2011-07-19 (×2): 25 mg via ORAL
  Filled 2011-07-18 (×4): qty 1

## 2011-07-18 MED ORDER — POTASSIUM CHLORIDE CRYS ER 20 MEQ PO TBCR
20.0000 meq | EXTENDED_RELEASE_TABLET | Freq: Every day | ORAL | Status: DC
  Administered 2011-07-19: 20 meq via ORAL
  Filled 2011-07-18: qty 1

## 2011-07-18 MED ORDER — SODIUM CHLORIDE 0.9 % IV BOLUS (SEPSIS)
500.0000 mL | Freq: Once | INTRAVENOUS | Status: AC
  Administered 2011-07-18: 500 mL via INTRAVENOUS

## 2011-07-18 MED ORDER — FUROSEMIDE 10 MG/ML IJ SOLN
40.0000 mg | Freq: Once | INTRAMUSCULAR | Status: AC
  Administered 2011-07-18: 40 mg via INTRAVENOUS
  Filled 2011-07-18: qty 4

## 2011-07-18 MED ORDER — METFORMIN HCL 500 MG PO TABS
500.0000 mg | ORAL_TABLET | Freq: Two times a day (BID) | ORAL | Status: DC
  Administered 2011-07-19 – 2011-07-20 (×3): 500 mg via ORAL
  Filled 2011-07-18 (×3): qty 1

## 2011-07-18 MED ORDER — GABAPENTIN 300 MG PO CAPS
300.0000 mg | ORAL_CAPSULE | Freq: Every evening | ORAL | Status: DC | PRN
  Administered 2011-07-18 – 2011-07-19 (×2): 300 mg via ORAL
  Filled 2011-07-18 (×6): qty 1

## 2011-07-18 MED ORDER — IPRATROPIUM BROMIDE 0.02 % IN SOLN
0.5000 mg | Freq: Once | RESPIRATORY_TRACT | Status: AC
  Administered 2011-07-18: 0.5 mg via RESPIRATORY_TRACT
  Filled 2011-07-18: qty 2.5

## 2011-07-18 MED ORDER — INFLUENZA VIRUS VACC SPLIT PF IM SUSP
0.5000 mL | INTRAMUSCULAR | Status: AC
  Administered 2011-07-19: 0.5 mL via INTRAMUSCULAR
  Filled 2011-07-18: qty 0.5

## 2011-07-18 MED ORDER — FUROSEMIDE 40 MG PO TABS
40.0000 mg | ORAL_TABLET | Freq: Every day | ORAL | Status: DC
  Administered 2011-07-19: 40 mg via ORAL
  Filled 2011-07-18: qty 1

## 2011-07-18 MED ORDER — SIMVASTATIN 20 MG PO TABS
40.0000 mg | ORAL_TABLET | ORAL | Status: DC
  Administered 2011-07-19: 40 mg via ORAL
  Filled 2011-07-18: qty 1
  Filled 2011-07-18: qty 2
  Filled 2011-07-18: qty 1

## 2011-07-18 MED ORDER — ASPIRIN 81 MG PO CHEW
81.0000 mg | CHEWABLE_TABLET | Freq: Every day | ORAL | Status: DC
  Administered 2011-07-19: 81 mg via ORAL
  Filled 2011-07-18: qty 1

## 2011-07-18 MED ORDER — DIAZEPAM 5 MG PO TABS
10.0000 mg | ORAL_TABLET | Freq: Three times a day (TID) | ORAL | Status: DC
  Administered 2011-07-18 – 2011-07-19 (×4): 10 mg via ORAL
  Filled 2011-07-18 (×4): qty 2

## 2011-07-18 MED ORDER — IPRATROPIUM BROMIDE 0.02 % IN SOLN
0.5000 mg | RESPIRATORY_TRACT | Status: DC
  Administered 2011-07-18 – 2011-07-19 (×7): 0.5 mg via RESPIRATORY_TRACT
  Filled 2011-07-18 (×9): qty 2.5

## 2011-07-18 MED ORDER — OMEGA-3-ACID ETHYL ESTERS 1 G PO CAPS
2.0000 g | ORAL_CAPSULE | Freq: Every day | ORAL | Status: DC
  Administered 2011-07-19: 2 g via ORAL
  Filled 2011-07-18 (×3): qty 2

## 2011-07-18 MED ORDER — PNEUMOCOCCAL VAC POLYVALENT 25 MCG/0.5ML IJ INJ
0.5000 mL | INJECTION | INTRAMUSCULAR | Status: AC
  Administered 2011-07-19: 0.5 mL via INTRAMUSCULAR
  Filled 2011-07-18: qty 0.5

## 2011-07-18 NOTE — ED Notes (Signed)
Attempted to give report, was told that she would call back in 2 minutes

## 2011-07-18 NOTE — ED Notes (Signed)
MD at bedside. 

## 2011-07-18 NOTE — ED Notes (Signed)
C/o weakness and fatigue for past 3 days

## 2011-07-18 NOTE — ED Notes (Signed)
Pt c/o sob/weakness x 4 days.

## 2011-07-18 NOTE — ED Provider Notes (Signed)
History     CSN: [id number]  Arrival date & time 07/18/11  1612   First MD Initiated Contact with Patient 07/18/11 1645      Chief Complaint  Patient presents with  . Shortness of Breath    (Consider location/radiation/quality/duration/timing/severity/associated sxs/prior treatment) HPI Comments: Generalized weakness over the past 4 days. States she's been to the point where she has difficulty walking a short distance secondary to generalized weakness. Associated near-syncope.  Patient is a 66 y.o. female presenting with shortness of breath. The history is provided by the patient. No language interpreter was used.  Shortness of Breath  The current episode started 3 to 5 days ago (4 days ago). The onset was gradual. The problem occurs continuously. The problem has been gradually worsening. The problem is moderate. The symptoms are relieved by nothing. The symptoms are aggravated by activity. Associated symptoms include cough, shortness of breath and wheezing. Pertinent negatives include no chest pain, no chest pressure, no orthopnea, no fever, no rhinorrhea and no sore throat. The cough has no precipitants. The cough is dry. Nothing relieves the cough. The cough is worsened by activity. She has had intermittent steroid use. She has had prior hospitalizations.    Past Medical History  Diagnosis Date  . Hypertension   . Diabetes mellitus   . Arthritis   . Blood transfusion   . Hx pulmonary embolism   . Anemia   . COPD (chronic obstructive pulmonary disease)   . Depression   . Anxiety   . Gastroesophageal reflux disease   . Osteoporosis   . DDD (degenerative disc disease)   . Uterine prolapse   . Uterine prolapse   . Enlarged heart   . Asthma   . CHF (congestive heart failure)   . Emphysema of lung   . Heart murmur   . Hyperlipidemia   . Thyroid disease     Past Surgical History  Procedure Date  . Hiatel hernia repair   . Tubal ligation   . Colonoscopy w/ polypectomy      Family History  Problem Relation Age of Onset  . Heart disease Mother   . Stroke Mother   . Prostate cancer Father   . Cancer Father     bone  . Liver cancer Brother   . Cancer Brother     leukemia  . Alzheimer's disease      family history  . Cancer Daughter     breast  . Cancer Paternal Uncle     unsure of what kind    History  Substance Use Topics  . Smoking status: Former Games developer  . Smokeless tobacco: Not on file   Comment: quit in 1996  . Alcohol Use: No    OB History    Grav Para Term Preterm Abortions TAB SAB Ect Mult Living                  Review of Systems  Constitutional: Positive for fatigue. Negative for fever, activity change and appetite change.  HENT: Negative for congestion, sore throat, rhinorrhea, neck pain and neck stiffness.   Respiratory: Positive for cough, shortness of breath and wheezing.   Cardiovascular: Negative for chest pain, palpitations and orthopnea.  Gastrointestinal: Negative for nausea, vomiting and abdominal pain.  Genitourinary: Negative for dysuria, urgency, frequency and flank pain.  Musculoskeletal: Negative for myalgias, back pain and arthralgias.  Neurological: Positive for weakness. Negative for dizziness, tremors, light-headedness and numbness.  All other systems reviewed and are negative.  Allergies  Procaine hcl; Codeine; Latex; and Penicillins  Home Medications   Current Outpatient Rx  Name Route Sig Dispense Refill  . AMITRIPTYLINE HCL 25 MG PO TABS Oral Take 25 mg by mouth at bedtime.      . ASPIRIN 81 MG PO CHEW Oral Chew 81 mg by mouth daily.      . CHOLINE FENOFIBRATE 135 MG PO CPDR Oral Take 135 mg by mouth daily.      Marland Kitchen DIAZEPAM 10 MG PO TABS Oral Take 10 mg by mouth 3 (three) times daily.     Marland Kitchen ESCITALOPRAM OXALATE 20 MG PO TABS Oral Take 20 mg by mouth daily.      . OMEGA-3 FATTY ACIDS 1000 MG PO CAPS Oral Take 2 g by mouth daily.      . FUROSEMIDE 40 MG PO TABS Oral Take 40 mg by mouth daily.      Marland Kitchen GABAPENTIN 300 MG PO CAPS Oral Take 300 mg by mouth at bedtime and may repeat dose one time if needed.    Marland Kitchen GLIMEPIRIDE 1 MG PO TABS Oral Take 1 mg by mouth daily before breakfast.      . IPRATROPIUM-ALBUTEROL 0.5-2.5 (3) MG/3ML IN SOLN Nebulization Take 3 mLs by nebulization every 4 (four) hours as needed. For shortness of breath     . LEVOTHYROXINE SODIUM 75 MCG PO TABS Oral Take 75 mcg by mouth daily.      Marland Kitchen METFORMIN HCL 500 MG PO TABS Oral Take 500 mg by mouth 2 (two) times daily.     Marland Kitchen METOPROLOL SUCCINATE ER 25 MG PO TB24 Oral Take 25 mg by mouth daily.      Marland Kitchen OVER THE COUNTER MEDICATION Oral Take 1 capsule by mouth 3 (three) times daily. Vinegar capsule     . POTASSIUM CHLORIDE CRYS ER 20 MEQ PO TBCR Oral Take 20 mEq by mouth daily.      Marland Kitchen SIMVASTATIN 40 MG PO TABS Oral Take 40 mg by mouth every morning.     Marland Kitchen TRAMADOL HCL 50 MG PO TABS Oral Take 50 mg by mouth 3 (three) times daily as needed. For pain     . VITAMIN E/FOLIC ACID/B-6/B-12 PO Oral Take by mouth.        BP 119/72  Pulse 78  Temp(Src) 97.6 F (36.4 C) (Oral)  Resp 25  SpO2 87%  Physical Exam  Nursing note and vitals reviewed. Constitutional: She is oriented to person, place, and time. She appears well-developed and well-nourished. No distress.  HENT:  Head: Normocephalic and atraumatic.  Mouth/Throat: Oropharynx is clear and moist.  Eyes: Conjunctivae and EOM are normal. Pupils are equal, round, and reactive to light.  Neck: Normal range of motion. Neck supple.  Cardiovascular: Normal rate, regular rhythm, normal heart sounds and intact distal pulses.  Exam reveals no gallop and no friction rub.   No murmur heard. Pulmonary/Chest: Effort normal. No respiratory distress. She has wheezes. She has rales.  Abdominal: Soft. Bowel sounds are normal. There is no tenderness.  Musculoskeletal: Normal range of motion. She exhibits no tenderness.  Neurological: She is alert and oriented to person, place, and time. No  cranial nerve deficit.  Skin: Skin is warm and dry. No rash noted.    ED Course  Procedures (including critical care time)   Date: 07/18/2011  Rate: 89  Rhythm: normal sinus rhythm  QRS Axis: right  Intervals: normal  ST/T Wave abnormalities: lateral twave inversions new  Conduction Disutrbances:none  Narrative  Interpretation:   Old EKG Reviewed: changes noted  CRITICAL CARE Performed by: Dayton Bailiff   Total critical care time: 30 min  Critical care time was exclusive of separately billable procedures and treating other patients.  Critical care was necessary to treat or prevent imminent or life-threatening deterioration.  Critical care was time spent personally by me on the following activities: development of treatment plan with patient and/or surrogate as well as nursing, discussions with consultants, evaluation of patient's response to treatment, examination of patient, obtaining history from patient or surrogate, ordering and performing treatments and interventions, ordering and review of laboratory studies, ordering and review of radiographic studies, pulse oximetry and re-evaluation of patient's condition.  Labs Reviewed  BASIC METABOLIC PANEL - Abnormal; Notable for the following:    Glucose, Bld 203 (*)    BUN 27 (*)    Creatinine, Ser 1.46 (*)    GFR calc non Af Amer 37 (*)    GFR calc Af Amer 43 (*)    All other components within normal limits  PRO B NATRIURETIC PEPTIDE - Abnormal; Notable for the following:    Pro B Natriuretic peptide (BNP) 4300.0 (*)    All other components within normal limits  CBC  DIFFERENTIAL  PROTIME-INR  POCT I-STAT TROPONIN I   Dg Chest Portable 1 View  07/18/2011  *RADIOLOGY REPORT*  Clinical Data: Shortness of breath  PORTABLE CHEST - 1 VIEW  Comparison: 04/12/2011  Findings: Stable granuloma in the right midlung.  Stable cardiomegaly.  No focal infiltrate or overt edema.  No definite effusion.  Regional bones unremarkable.   IMPRESSION:  1.  Stable mild cardiomegaly.  No acute disease.  Original Report Authenticated By: Thora Lance III, M.D.     1. COPD exacerbation   2. Hypoxia       MDM  COPD exacerbation with worsening hypoxia. Patient requires 2 L of oxygen at home but is requiring additional to maintain saturations. She's able to cannulate without dropping her sats. Some subjective improvement after 2 breathing treatments. She'll require admission to the hospital for further evaluation. She does have a history of pulmonary embolus but I do not feel this is the cause as she has no chest pain and evidence of wheezing on evaluation. Shows is a history of CHF but do not see any evidence clinically that this is the cause. Chest x-ray shows no pulmonary edema. I discussed the case with Dr Renard Matter who will admit the patient        Dayton Bailiff, MD 07/18/11 [phone]

## 2011-07-19 DIAGNOSIS — J441 Chronic obstructive pulmonary disease with (acute) exacerbation: Secondary | ICD-10-CM | POA: Diagnosis present

## 2011-07-19 LAB — GLUCOSE, CAPILLARY
Glucose-Capillary: 286 mg/dL — ABNORMAL HIGH (ref 70–99)
Glucose-Capillary: 294 mg/dL — ABNORMAL HIGH (ref 70–99)

## 2011-07-19 MED ORDER — MOXIFLOXACIN HCL 400 MG PO TABS
400.0000 mg | ORAL_TABLET | Freq: Every day | ORAL | Status: DC
  Administered 2011-07-19: 400 mg via ORAL
  Filled 2011-07-19: qty 1

## 2011-07-19 MED ORDER — PREDNISONE 20 MG PO TABS
40.0000 mg | ORAL_TABLET | Freq: Every day | ORAL | Status: DC
  Administered 2011-07-19 – 2011-07-20 (×2): 40 mg via ORAL
  Filled 2011-07-19 (×2): qty 2

## 2011-07-19 MED ORDER — SODIUM CHLORIDE 0.9 % IV SOLN
INTRAVENOUS | Status: DC
  Administered 2011-07-19 (×2): via INTRAVENOUS

## 2011-07-19 MED ORDER — GLIMEPIRIDE 2 MG PO TABS
2.0000 mg | ORAL_TABLET | Freq: Every day | ORAL | Status: DC
  Administered 2011-07-20: 2 mg via ORAL
  Filled 2011-07-19: qty 1

## 2011-07-19 MED ORDER — BIOTENE DRY MOUTH MT LIQD
15.0000 mL | Freq: Two times a day (BID) | OROMUCOSAL | Status: DC
  Administered 2011-07-19 – 2011-07-20 (×3): 15 mL via OROMUCOSAL

## 2011-07-19 MED ORDER — SODIUM CHLORIDE 0.9 % IJ SOLN
INTRAMUSCULAR | Status: AC
  Administered 2011-07-19: 3 mL
  Filled 2011-07-19: qty 3

## 2011-07-19 NOTE — Progress Notes (Signed)
Inpatient Diabetes Program Recommendations  AACE/ADA: New Consensus Statement on Inpatient Glycemic Control (2009)  Target Ranges:  Prepandial:   less than 140 mg/dL      Peak postprandial:   less than 180 mg/dL (1-2 hours)      Critically ill patients:  140 - 180 mg/dL   Reason for Visit: Elevated glucose: 249 mg/dL  Inpatient Diabetes Program Recommendations Correction (SSI): Add Novolog Correction

## 2011-07-19 NOTE — H&P (Addendum)
[name] MRN: [id number] DOB/AGE: 10-21-1945 66 y.o.  Admit date: 07/18/2011 Chief Complaint: Dyspnea, dry cough. HPI: This 66 year old lady gives a one-week history of the above symptoms. She is known to have COPD, although she quit smoking 10 years ago. She denied a fever. When she was seen in the emergency room she was found to be hypoxic requiring more than 2 L oxygen to maintain saturations. For this reason, she was admitted to the hospital. There was no real evidence of congestive heart failure clinically or radiologically and clinically it was not felt that she had a pulmonary embolism, something which she has had in the past. She feels much improved overnight with intravenous steroids.  Past Medical History  Diagnosis Date  . Hypertension   . Diabetes mellitus   . Arthritis   . Blood transfusion   . Hx pulmonary embolism   . Anemia   . COPD (chronic obstructive pulmonary disease)   . Depression   . Anxiety   . Gastroesophageal reflux disease   . Osteoporosis   . DDD (degenerative disc disease)   . Uterine prolapse   . Uterine prolapse   . Enlarged heart   . Asthma   . CHF (congestive heart failure)   . Emphysema of lung   . Heart murmur   . Hyperlipidemia   . Thyroid disease    Past Surgical History  Procedure Date  . Hiatel hernia repair   . Tubal ligation   . Colonoscopy w/ polypectomy         Family History  Problem Relation Age of Onset  . Heart disease Mother   . Stroke Mother   . Prostate cancer Father   . Cancer Father     bone  . Liver cancer Brother   . Cancer Brother     leukemia  . Alzheimer's disease      family history  . Cancer Daughter     breast  . Cancer Paternal Uncle     unsure of what kind     Social history: She is married and lives with her husband. She quit smoking 10 years ago. She does not drink any significant alcohol.  Allergies:  Allergies  Allergen Reactions  . Procaine Hcl Anaphylaxis  . Codeine    REACTION: Reaction not known  . Latex   . Penicillins Rash    Medications Prior to Admission  Medication Dose Route Frequency Provider Last Rate Last Dose  . 0.9 %  sodium chloride infusion   Intravenous Continuous Anwita Mencer C Xianna Siverling, MD      . ipratropium (ATROVENT) nebulizer solution 0.5 mg  0.5 mg Nebulization Q4H Angus G McInnis, MD   0.5 mg at 07/19/11 1131   And  . albuterol (PROVENTIL) (5 MG/ML) 0.5% nebulizer solution 2.5 mg  2.5 mg Nebulization Q4H Angus G McInnis, MD   2.5 mg at 07/19/11 1131  . albuterol (PROVENTIL) (5 MG/ML) 0.5% nebulizer solution 5 mg  5 mg Nebulization Once Dayton Bailiff, MD   5 mg at 07/18/11 1651  . albuterol (PROVENTIL) (5 MG/ML) 0.5% nebulizer solution 5 mg  5 mg Nebulization Once Dayton Bailiff, MD   5 mg at 07/18/11 1824  . amitriptyline (ELAVIL) tablet 25 mg  25 mg Oral QHS Alice Reichert, MD   25 mg at 07/18/11 2207  . antiseptic oral rinse (BIOTENE) solution 15 mL  15 mL Mouth Rinse BID Angus Edilia Bo, MD   15 mL at 07/19/11 0900  . aspirin chewable  tablet 81 mg  81 mg Oral Daily Angus Edilia Bo, MD   81 mg at 07/19/11 1054  . diazepam (VALIUM) tablet 10 mg  10 mg Oral TID Alice Reichert, MD   10 mg at 07/19/11 1100  . enoxaparin (LOVENOX) injection 40 mg  40 mg Subcutaneous Q24H Alice Reichert, MD   40 mg at 07/18/11 2206  . escitalopram (LEXAPRO) tablet 20 mg  20 mg Oral Daily Angus Edilia Bo, MD   20 mg at 07/19/11 1100  . fenofibrate tablet 160 mg  160 mg Oral Daily Dayton Bailiff, MD   160 mg at 07/19/11 1054  . furosemide (LASIX) injection 40 mg  40 mg Intravenous Once Alice Reichert, MD   40 mg at 07/18/11 2207  . furosemide (LASIX) tablet 40 mg  40 mg Oral Daily Angus Edilia Bo, MD   40 mg at 07/19/11 1100  . gabapentin (NEURONTIN) capsule 300 mg  300 mg Oral QHS,MR X 1 Angus Edilia Bo, MD   300 mg at 07/18/11 2207  . glimepiride (AMARYL) tablet 1 mg  1 mg Oral QAC breakfast Alice Reichert, MD   1 mg at 07/19/11 0803  . influenza  inactive virus  vaccine (FLUZONE/FLUARIX) injection 0.5 mL  0.5 mL Intramuscular Tomorrow-1000 Angus G McInnis, MD   0.5 mL at 07/19/11 1051  . ipratropium (ATROVENT) nebulizer solution 0.5 mg  0.5 mg Nebulization Once Dayton Bailiff, MD   0.5 mg at 07/18/11 1651  . levothyroxine (SYNTHROID, LEVOTHROID) tablet 75 mcg  75 mcg Oral QAC breakfast Alice Reichert, MD   75 mcg at 07/19/11 0804  . metFORMIN (GLUCOPHAGE) tablet 500 mg  500 mg Oral BID Alice Reichert, MD   500 mg at 07/19/11 0804  . methylPREDNISolone sodium succinate (SOLU-MEDROL) 125 MG injection 125 mg  125 mg Intravenous Once Dayton Bailiff, MD   125 mg at 07/18/11 1659  . metoprolol succinate (TOPROL-XL) 24 hr tablet 25 mg  25 mg Oral Daily Angus Edilia Bo, MD   25 mg at 07/19/11 1101  . omega-3 acid ethyl esters (LOVAZA) capsule 2 g  2 g Oral Daily Angus Edilia Bo, MD   2 g at 07/19/11 1054  . pneumococcal 23 valent vaccine (PNU-IMMUNE) injection 0.5 mL  0.5 mL Intramuscular Tomorrow-1000 Angus G McInnis, MD   0.5 mL at 07/19/11 1052  . potassium chloride SA (K-DUR,KLOR-CON) CR tablet 20 mEq  20 mEq Oral Daily Angus Edilia Bo, MD   20 mEq at 07/19/11 1054  . predniSONE (DELTASONE) tablet 40 mg  40 mg Oral Q breakfast Ozzy Bohlken C Jayshun Galentine, MD      . simvastatin (ZOCOR) tablet 40 mg  40 mg Oral BH-q7a Angus Edilia Bo, MD   40 mg at 07/19/11 1054  . sodium chloride 0.9 % bolus 500 mL  500 mL Intravenous Once Dayton Bailiff, MD   500 mL at 07/18/11 1659  . sodium chloride 0.9 % bolus 500 mL  500 mL Intravenous Once Dayton Bailiff, MD   500 mL at 07/18/11 1816  . DISCONTD: fenofibrate tablet 54 mg  54 mg Oral Daily Angus Edilia Bo, MD      . DISCONTD: methylPREDNISolone sodium succinate (SOLU-MEDROL) 125 MG injection 125 mg  125 mg Intravenous Q6H Angus Edilia Bo, MD   125 mg at 07/19/11 1053  . DISCONTD: sulfamethoxazole-trimethoprim (BACTRIM DS) 800-160 MG per tablet 1 tablet  1 tablet Oral Q12H Angus Edilia Bo, MD   1 tablet at 07/19/11  1100   Medications Prior to  Admission  Medication Sig Dispense Refill  . amitriptyline (ELAVIL) 25 MG tablet Take 25 mg by mouth at bedtime.        Marland Kitchen aspirin 81 MG chewable tablet Chew 81 mg by mouth daily.        . Choline Fenofibrate (TRILIPIX) 135 MG capsule Take 135 mg by mouth daily.        . diazepam (VALIUM) 10 MG tablet Take 10 mg by mouth 3 (three) times daily.       Marland Kitchen escitalopram (LEXAPRO) 20 MG tablet Take 20 mg by mouth daily.        . fish oil-omega-3 fatty acids 1000 MG capsule Take 2 g by mouth daily.        . furosemide (LASIX) 40 MG tablet Take 40 mg by mouth daily.       Marland Kitchen gabapentin (NEURONTIN) 300 MG capsule Take 300 mg by mouth at bedtime and may repeat dose one time if needed.      Marland Kitchen glimepiride (AMARYL) 1 MG tablet Take 1 mg by mouth daily before breakfast.        . ipratropium-albuterol (DUONEB) 0.5-2.5 (3) MG/3ML SOLN Take 3 mLs by nebulization every 4 (four) hours as needed. For shortness of breath       . levothyroxine (SYNTHROID, LEVOTHROID) 75 MCG tablet Take 75 mcg by mouth daily.        . metFORMIN (GLUCOPHAGE) 500 MG tablet Take 500 mg by mouth 2 (two) times daily.       . metoprolol succinate (TOPROL-XL) 25 MG 24 hr tablet Take 25 mg by mouth daily.        Marland Kitchen OVER THE COUNTER MEDICATION Take 1 capsule by mouth 3 (three) times daily. Vinegar capsule       . potassium chloride SA (K-DUR,KLOR-CON) 20 MEQ tablet Take 20 mEq by mouth daily.        . simvastatin (ZOCOR) 40 MG tablet Take 40 mg by mouth every morning.       . traMADol (ULTRAM) 50 MG tablet Take 50 mg by mouth 3 (three) times daily as needed. For pain       . Multiple Vitamin (VITAMIN E/FOLIC ACID/B-6/B-12 PO) Take by mouth.             ZOX:WRUEA from the symptoms mentioned above,there are no other symptoms referable to all systems reviewed.  Physical Exam: Blood pressure 117/73, pulse 85, temperature 98.1 F (36.7 C), temperature source Oral, resp. rate 20, height 5\' 2"  (1.575 m), weight 80.8 kg (178 lb 2.1 oz), SpO2  93.00%. She now looks systemically well. There is no increased work of breathing. There is no peripheral or central cyanosis. Lung fields are very clear without any evidence of wheezing. Heart sounds are present and normal without murmurs. Jugular venous pressure not elevated. She is alert and orientated without any focal neurological signs. Abdomen is soft and nontender.    Basename 07/18/11 1700  WBC 7.3  NEUTROABS 5.0  HGB 12.3  HCT 38.3  MCV 88.7  PLT 261    Basename 07/18/11 1700  NA 136  K 4.1  CL 98  CO2 24  GLUCOSE 203*  BUN 27*  CREATININE 1.46*  CALCIUM 9.8  MG --         Dg Chest Portable 1 View  07/18/2011  *RADIOLOGY REPORT*  Clinical Data: Shortness of breath  PORTABLE CHEST - 1 VIEW  Comparison: 04/12/2011  Findings: Stable granuloma in  the right midlung.  Stable cardiomegaly.  No focal infiltrate or overt edema.  No definite effusion.  Regional bones unremarkable.  IMPRESSION:  1.  Stable mild cardiomegaly.  No acute disease.  Original Report Authenticated By: Osa Craver, M.D.   Impression: 1. Exacerbation of COPD. 2. Diabetes mellitus. 3. Hypertension. 4. Acute renal failure, mild.     Plan: 1. Continue current medication except that I would discontinue IV steroids and start oral steroids. 2. Oral antibiotics. 3. Start gentle IV fluids in view of her acute renal failure. 4. If she continues to do well, consider discharge home tomorrow.      Wilson Singer Pager [phone]  07/19/2011, 12:08 PM

## 2011-07-19 NOTE — Progress Notes (Signed)
Pt appears confused but is in no resp distress, talking etc without o2 sat 85 on room air , pt has company sitting with her

## 2011-07-19 NOTE — Progress Notes (Signed)
[name], [name]              ACCOUNT NO.:  [id number]  MEDICAL RECORD NO.:  [id number]  LOCATION:  A318                          FACILITY:  [facility]  PHYSICIAN:  Lexine Jaspers D. Felecia Shelling, MD   DATE OF BIRTH:  29-Mar-1946  DATE OF PROCEDURE:  07/19/2011 DATE OF DISCHARGE:                                PROGRESS NOTE   The patient still complains of shortness of breath .  She is receiving nebulizer treatment.  No fever or chills.  OBJECTIVE:  GENERAL:  The patient is alert, awake, and sick looking. VITAL SIGNS:  Blood pressure 100/52, pulse 77, respiratory rate 20, temperature 97.6 degrees Fahrenheit. CHEST:  Poor air entry, bilateral rhonchi and crackles.  CARDIOVASCULAR SYSTEM:  First and second heart sounds heard.  No murmur.  No gallop. ABDOMEN:  Soft and lax.  Bowel sound is positive.  No mass or organomegaly. EXTREMITIES:  No leg edema.  LABORATORIES:  BNP 4300.  CBC: WBC 7.3, hemoglobin 12.3, hematocrit 38.3 and platelets 261.  BMP:  Sodium 136, potassium 4.1, chloride 98, carbon dioxide 24, glucose 203, BUN 27, creatinine 1.4, calcium 9.8.  Chest x-ray showed mild cardiomegaly.  ASSESSMENT: 1. Acute exacerbation of chronic obstructive pulmonary disease. 2. Elevated BNP to rule out congestive changes. 3. Diabetes mellitus. 4. Hypertension. 5. Anxiety/depression disorder.  PLAN:  We will continue the patient on nebulizer treatments.  We will continue oral diuretics.  Continue her regular medications.  Continue to monitor her BMP and oxygen saturation level.     [name] D. Felecia Shelling, MD     TDF/MEDQ  D:  07/19/2011  T:  07/19/2011  Job:  [id number]

## 2011-07-20 LAB — COMPREHENSIVE METABOLIC PANEL
ALT: 19 U/L (ref 0–35)
AST: 20 U/L (ref 0–37)
Albumin: 3.1 g/dL — ABNORMAL LOW (ref 3.5–5.2)
Alkaline Phosphatase: 32 U/L — ABNORMAL LOW (ref 39–117)
Chloride: 103 mEq/L (ref 96–112)
Creatinine, Ser: 1.28 mg/dL — ABNORMAL HIGH (ref 0.50–1.10)
Potassium: 4.6 mEq/L (ref 3.5–5.1)
Sodium: 139 mEq/L (ref 135–145)
Total Bilirubin: 0.2 mg/dL — ABNORMAL LOW (ref 0.3–1.2)

## 2011-07-20 LAB — CBC
Platelets: 230 10*3/uL (ref 150–400)
RDW: 16.1 % — ABNORMAL HIGH (ref 11.5–15.5)
WBC: 14.8 10*3/uL — ABNORMAL HIGH (ref 4.0–10.5)

## 2011-07-20 MED ORDER — GLIMEPIRIDE 1 MG PO TABS: 2.0000 mg | ORAL_TABLET | Freq: Every day | ORAL | Status: DC

## 2011-07-20 MED ORDER — PREDNISONE 20 MG PO TABS: ORAL_TABLET | ORAL | Status: DC

## 2011-07-20 MED ORDER — MOXIFLOXACIN HCL 400 MG PO TABS: 400.0000 mg | ORAL_TABLET | Freq: Every day | ORAL | Status: AC

## 2011-07-20 NOTE — Progress Notes (Signed)
02 Decreased from 4L to 2L/[place]. Checked O2 Stats 15 min after O2 Decreased and patients O2 Sats 95% on 2L/[place].

## 2011-07-20 NOTE — Progress Notes (Signed)
Discharge Summary: a/o.vss. Up ad lib with cane. Saline lock removed. No complaints of any distress.  Discharge instructions given. Prescriptions given. Pt verbalized understanding of instructions. Awaiting for spouse to arrive to be discharge home.

## 2011-07-20 NOTE — Discharge Summary (Signed)
Physician Discharge Summary  Patient ID: [name] MRN: [id number] DOB/AGE: 11/11/1945 66 y.o.  Admit date: 07/18/2011 Discharge date: 07/20/2011    Discharge Diagnoses:  1. Exacerbation of COPD. 2. Diabetes mellitus. 3. Hypertension. 4. Acute renal failure secondary to dehydration, resolving. Creatinine 1.28.   Medication List  As of 07/20/2011  8:27 AM   TAKE these medications         amitriptyline 25 MG tablet   Commonly known as: ELAVIL   Take 25 mg by mouth at bedtime.      aspirin 81 MG chewable tablet   Chew 81 mg by mouth daily.      diazepam 10 MG tablet   Commonly known as: VALIUM   Take 10 mg by mouth 3 (three) times daily.      escitalopram 20 MG tablet   Commonly known as: LEXAPRO   Take 20 mg by mouth daily.      fish oil-omega-3 fatty acids 1000 MG capsule   Take 2 g by mouth daily.      furosemide 40 MG tablet   Commonly known as: LASIX   Take 40 mg by mouth daily.      gabapentin 300 MG capsule   Commonly known as: NEURONTIN   Take 300 mg by mouth at bedtime and may repeat dose one time if needed.      glimepiride 1 MG tablet   Commonly known as: AMARYL   Take 2 tablets (2 mg total) by mouth daily before breakfast.      ipratropium-albuterol 0.5-2.5 (3) MG/3ML Soln   Commonly known as: DUONEB   Take 3 mLs by nebulization every 4 (four) hours as needed. For shortness of breath      levothyroxine 75 MCG tablet   Commonly known as: SYNTHROID, LEVOTHROID   Take 75 mcg by mouth daily.      metFORMIN 500 MG tablet   Commonly known as: GLUCOPHAGE   Take 500 mg by mouth 2 (two) times daily.      metoprolol succinate 25 MG 24 hr tablet   Commonly known as: TOPROL-XL   Take 25 mg by mouth daily.      moxifloxacin 400 MG tablet   Commonly known as: AVELOX   Take 1 tablet (400 mg total) by mouth daily at 6 PM.      OVER THE COUNTER MEDICATION   Take 1 capsule by mouth 3 (three) times daily. Vinegar capsule      potassium chloride SA 20 MEQ  tablet   Commonly known as: K-DUR,KLOR-CON   Take 20 mEq by mouth daily.      predniSONE 20 MG tablet   Commonly known as: DELTASONE   Take 2 tablets daily for 3 days, then 1 tablet daily for 3 days, then half tablet daily for 3 days, then STOP.      simvastatin 40 MG tablet   Commonly known as: ZOCOR   Take 40 mg by mouth every morning.      traMADol 50 MG tablet   Commonly known as: ULTRAM   Take 50 mg by mouth 3 (three) times daily as needed. For pain        TRILIPIX 135 MG capsule   Generic drug: Choline Fenofibrate   Take 135 mg by mouth daily.      VITAMIN E/FOLIC ACID/B-6/B-12 PO   Take by mouth.            Discharged Condition: Stable and improved.    Consults: None.  Significant Diagnostic Studies: Dg Chest Portable 1 View  07/18/2011  *RADIOLOGY REPORT*  Clinical Data: Shortness of breath  PORTABLE CHEST - 1 VIEW  Comparison: 04/12/2011  Findings: Stable granuloma in the right midlung.  Stable cardiomegaly.  No focal infiltrate or overt edema.  No definite effusion.  Regional bones unremarkable.  IMPRESSION:  1.  Stable mild cardiomegaly.  No acute disease.  Original Report Authenticated By: Osa Craver, M.D.    Lab Results: Basic Metabolic Panel:  Basename 07/20/11 0622 07/18/11 1700  NA 139 136  K 4.6 4.1  CL 103 98  CO2 28 24  GLUCOSE 156* 203*  BUN 30* 27*  CREATININE 1.28* 1.46*  CALCIUM 9.5 9.8  MG -- --  PHOS -- --   Liver Function Tests:  [facility] 07/20/11 0622  AST 20  ALT 19  ALKPHOS 32*  BILITOT 0.2*  PROT 6.5  ALBUMIN 3.1*     CBC:  Basename 07/20/11 0622 07/18/11 1700  WBC 14.8* 7.3  NEUTROABS -- 5.0  HGB 10.7* 12.3  HCT 34.3* 38.3  MCV 91.7 88.7  PLT 230 261       Hospital Course: This 66 year old lady was admitted with symptoms of dyspnea and a dry cough. She is known to have COPD, although she quit smoking 10 years ago. She normally requires 2 L oxygen per minute at home. She does not have a primary  care physician that I can see. She was admitted to the hospital and started on intravenous steroids and antibiotics. She has responded well. She is on oral steroids and feels well. She is saturating 95% on 2 L of oxygen currently. Chest x-ray did not show any evidence of pneumonia.  Discharge Exam: Blood pressure 99/63, pulse 63, temperature 97.5 F (36.4 C), temperature source Oral, resp. rate 18, height 5\' 2"  (1.575 m), weight 82.4 kg (181 lb 10.5 oz), SpO2 96.00%. She looks systemically well. Lung fields are clinically clear with minimal crackles, no wheezes and no bronchial breathing. There is no increased work of breathing. There is no peripheral or central cyanosis. Heart sounds are present and normal without murmurs. She is alert and orientated.  Disposition: Home. She will have a tapering course of prednisone and a further 5 day course of Avelox. She will need to find a primary care physician that she can followup with and we will try to arrange for her to see one.  Discharge Orders    Future Orders Please Complete By Expires   Diet - low sodium heart healthy      Increase activity slowly         Follow-up Information    Follow up with [facility], [place] .         SignedWilson Singer Pager [phone]  07/20/2011, 8:27 AM

## 2011-08-09 HISTORY — PX: CARDIAC CATHETERIZATION: SHX172

## 2011-08-14 ENCOUNTER — Emergency Department (HOSPITAL_COMMUNITY): Payer: Medicaid Other

## 2011-08-14 ENCOUNTER — Encounter (HOSPITAL_COMMUNITY): Payer: Self-pay | Admitting: *Deleted

## 2011-08-14 ENCOUNTER — Inpatient Hospital Stay (HOSPITAL_COMMUNITY)
Admission: EM | Admit: 2011-08-14 | Discharge: 2011-08-21 | DRG: 175 | Disposition: A | Discharge status: Home / Self Care | Payer: Medicaid Other | Attending: Internal Medicine | Admitting: Internal Medicine

## 2011-08-14 ENCOUNTER — Other Ambulatory Visit: Payer: Self-pay

## 2011-08-14 DIAGNOSIS — Z86711 Personal history of pulmonary embolism: Secondary | ICD-10-CM | POA: Diagnosis present

## 2011-08-14 DIAGNOSIS — J449 Chronic obstructive pulmonary disease, unspecified: Secondary | ICD-10-CM | POA: Diagnosis present

## 2011-08-14 DIAGNOSIS — I509 Heart failure, unspecified: Secondary | ICD-10-CM | POA: Diagnosis present

## 2011-08-14 DIAGNOSIS — Z7982 Long term (current) use of aspirin: Secondary | ICD-10-CM

## 2011-08-14 DIAGNOSIS — F32A Depression, unspecified: Secondary | ICD-10-CM | POA: Diagnosis present

## 2011-08-14 DIAGNOSIS — F329 Major depressive disorder, single episode, unspecified: Secondary | ICD-10-CM | POA: Diagnosis present

## 2011-08-14 DIAGNOSIS — F419 Anxiety disorder, unspecified: Secondary | ICD-10-CM | POA: Diagnosis present

## 2011-08-14 DIAGNOSIS — I129 Hypertensive chronic kidney disease with stage 1 through stage 4 chronic kidney disease, or unspecified chronic kidney disease: Secondary | ICD-10-CM | POA: Diagnosis present

## 2011-08-14 DIAGNOSIS — F0390 Unspecified dementia without behavioral disturbance: Secondary | ICD-10-CM | POA: Diagnosis present

## 2011-08-14 DIAGNOSIS — I5031 Acute diastolic (congestive) heart failure: Secondary | ICD-10-CM | POA: Diagnosis present

## 2011-08-14 DIAGNOSIS — I2789 Other specified pulmonary heart diseases: Secondary | ICD-10-CM | POA: Diagnosis present

## 2011-08-14 DIAGNOSIS — E785 Hyperlipidemia, unspecified: Secondary | ICD-10-CM | POA: Diagnosis present

## 2011-08-14 DIAGNOSIS — Z7901 Long term (current) use of anticoagulants: Secondary | ICD-10-CM

## 2011-08-14 DIAGNOSIS — E119 Type 2 diabetes mellitus without complications: Secondary | ICD-10-CM | POA: Diagnosis present

## 2011-08-14 DIAGNOSIS — N189 Chronic kidney disease, unspecified: Secondary | ICD-10-CM | POA: Diagnosis present

## 2011-08-14 DIAGNOSIS — I200 Unstable angina: Secondary | ICD-10-CM | POA: Diagnosis present

## 2011-08-14 DIAGNOSIS — Z9981 Dependence on supplemental oxygen: Secondary | ICD-10-CM

## 2011-08-14 DIAGNOSIS — I272 Pulmonary hypertension, unspecified: Secondary | ICD-10-CM | POA: Diagnosis present

## 2011-08-14 DIAGNOSIS — I10 Essential (primary) hypertension: Secondary | ICD-10-CM | POA: Diagnosis present

## 2011-08-14 DIAGNOSIS — R072 Precordial pain: Secondary | ICD-10-CM | POA: Diagnosis present

## 2011-08-14 DIAGNOSIS — J4489 Other specified chronic obstructive pulmonary disease: Secondary | ICD-10-CM | POA: Diagnosis present

## 2011-08-14 DIAGNOSIS — I2699 Other pulmonary embolism without acute cor pulmonale: Principal | ICD-10-CM | POA: Diagnosis present

## 2011-08-14 DIAGNOSIS — F341 Dysthymic disorder: Secondary | ICD-10-CM | POA: Diagnosis present

## 2011-08-14 HISTORY — DX: Depression, unspecified: F32.A

## 2011-08-14 HISTORY — DX: Anxiety disorder, unspecified: F41.9

## 2011-08-14 HISTORY — DX: Chronic kidney disease, unspecified: N18.9

## 2011-08-14 HISTORY — DX: Headache: R51

## 2011-08-14 HISTORY — DX: Unspecified convulsions: R56.9

## 2011-08-14 LAB — CBC
HCT: 39.1 % (ref 36.0–46.0)
Hemoglobin: 12.6 g/dL (ref 12.0–15.0)
MCHC: 32.2 g/dL (ref 30.0–36.0)
MCV: 90.9 fL (ref 78.0–100.0)

## 2011-08-14 LAB — DIFFERENTIAL
Basophils Relative: 1 % (ref 0–1)
Monocytes Absolute: 0.6 10*3/uL (ref 0.1–1.0)
Monocytes Relative: 11 % (ref 3–12)
Neutro Abs: 2.9 10*3/uL (ref 1.7–7.7)

## 2011-08-14 LAB — BASIC METABOLIC PANEL
BUN: 17 mg/dL (ref 6–23)
CO2: 32 mEq/L (ref 19–32)
Chloride: 99 mEq/L (ref 96–112)
GFR calc Af Amer: 63 mL/min — ABNORMAL LOW (ref >90)
Glucose, Bld: 154 mg/dL — ABNORMAL HIGH (ref 70–99)
Potassium: 4.3 mEq/L (ref 3.5–5.1)

## 2011-08-14 MED ORDER — NITROGLYCERIN 2 % TD OINT
1.0000 [in_us] | TOPICAL_OINTMENT | Freq: Once | TRANSDERMAL | Status: AC
  Administered 2011-08-14: 1 [in_us] via TOPICAL
  Filled 2011-08-14: qty 1

## 2011-08-14 NOTE — ED Notes (Signed)
CRITICAL VALUE ALERT  Critical value received:  Troponin 0.37  Date of notification:  08/14/11  Time of notification:  1715  Critical value read back:yes  Nurse who received alert:  c Kaiven Vester rn  MD notified (1st page):  Dr zammit  Time of first page:  1715  MD notified (2nd page):dr zammit  Time of second page:  Responding MD:  Dr zammit  Time MD responded:  [phone]

## 2011-08-14 NOTE — ED Notes (Signed)
States she has a diagnosis of congestive heart failure but is unable to get prescription filled due to cost

## 2011-08-14 NOTE — ED Notes (Signed)
Pt stable, in no apparent distress, resting with even rise and fall of chest, all VS WDL. Hospitalist at bedside to assess pt.

## 2011-08-14 NOTE — H&P (Addendum)
PCP:   [facility] Cardiologist: Dr. Alanda Amass at [facility] heart and vascular Psychotherapist: Dr. Guss Bunde, at [facility] in [facility], [address], [place]  Chief Complaint:  Episodic chest pain since this morning  HPI: [name] is an 66 y.o. female.  Multiple medical problems, including anxiety and depression,  history of congestive heart failure with a normal ejection, diabetes, hypertension, and chronic recurrence concerns about palpitations and an abnormal heart. Patient is scheduled to have an echocardiogram and evaluation of her renal arteries for renal artery stenosis on March 26, and has a followup appointment with Dr. Alanda Amass in early April.  Patient reports she was pretty much near her baseline until about 11 AM this morning when she had sudden onset of a dull aching pressure over her central chest,  described it as 8/10 in intensity, associated with profuse diaphoresis, palpitations, and a feeling as if she was going to pass out. There was no nausea or vomiting, and no frank syncope. She also reported associated with a left-sided neck pain, but in fact this neck pain is aggravated by moving her head, and started 3 days ago after she fell backwards in a recliner.  The patient and dizziness lasted about 5 minutes, resolving spontaneously. No aggravating or relieving factors could be identified. Pain has occurred episodically since then inccluding while being in the emergency room.  Patient arrived to the emergency room some 4 hours after the onset of the pain, and her troponins were found to be slightly elevated; on previous admissions for congestive heart failure her troponins were always been undetectable, even in the setting of acute renal failure at those times.  Because of the abnormal troponins, and an abnormal EKG, the cardiologist were consulted and they recommended transferring her to [facility] hospitalist service, for the [facility] heart and  vascular consult.  Patient admits to recurrent falls but says she does not usually discuss this with her doctors.   she denies alcohol or illicit drug use. She is prescribed Valium 10 mg 3 times daily, so she takes it only when necessary. Additionally she takes multiple medications for chronic pains, including Lyrica and tramadol.  She reports she is on disability because of her psychological problems, and is unable to be more specific.  Rewiew of Systems:  The patient denies anorexia, fever, weight loss,, vision loss, decreased hearing, hoarseness,  syncope, dyspnea on exertion, peripheral edema,hemoptysis, abdominal pain, melena, hematochezia, severe indigestion/heartburn, hematuria, incontinence, genital sores, muscle weakness, suspicious skin lesions, transient blindness, difficulty walking, unusual weight change, abnormal bleeding, enlarged lymph nodes, angioedema, and breast masses.   Past Medical History  Diagnosis Date  . Hypertension   . Diabetes mellitus   . Arthritis   . Blood transfusion   . Hx pulmonary embolism   . Anemia   . COPD (chronic obstructive pulmonary disease)   . Depression   . Anxiety   . Gastroesophageal reflux disease   . Osteoporosis   . DDD (degenerative disc disease)   . Uterine prolapse   . Uterine prolapse   . Enlarged heart   . Asthma   . CHF (congestive heart failure)   . Emphysema of lung   . Heart murmur   . Hyperlipidemia   . Thyroid disease     Past Surgical History  Procedure Date  . Hiatel hernia repair   . Tubal ligation   . Colonoscopy w/ polypectomy     Medications:  HOME MEDS: Prior to Admission medications   Medication  Sig Start Date End Date Taking? Authorizing Provider  albuterol (PROVENTIL HFA;VENTOLIN HFA) 108 (90 BASE) MCG/ACT inhaler Inhale 2 puffs into the lungs every 6 (six) hours as needed.   Yes Historical Provider, MD  amitriptyline (ELAVIL) 25 MG tablet Take 25 mg by mouth at bedtime.     Yes Historical  Provider, MD  aspirin 81 MG chewable tablet Chew 81 mg by mouth every morning.   Yes Historical Provider, MD  Choline Fenofibrate (TRILIPIX) 135 MG capsule Take 135 mg by mouth every morning.    Yes Historical Provider, MD  diazepam (VALIUM) 10 MG tablet Take 10 mg by mouth 3 (three) times daily.    Yes Historical Provider, MD  escitalopram (LEXAPRO) 20 MG tablet Take 20 mg by mouth every morning.    Yes Historical Provider, MD  fish oil-omega-3 fatty acids 1000 MG capsule Take 1 g by mouth every morning.    Yes Historical Provider, MD  furosemide (LASIX) 40 MG tablet Take 20 mg by mouth daily.    Yes Historical Provider, MD  gabapentin (NEURONTIN) 300 MG capsule Take 300 mg by mouth at bedtime and may repeat dose one time if needed.   Yes Historical Provider, MD  glimepiride (AMARYL) 1 MG tablet Take 2 tablets (2 mg total) by mouth daily before breakfast. 07/20/11  Yes Nimish C Gosrani, MD  ipratropium-albuterol (DUONEB) 0.5-2.5 (3) MG/3ML SOLN Take 3 mLs by nebulization every 4 (four) hours as needed. For shortness of breath    Yes Historical Provider, MD  levothyroxine (SYNTHROID, LEVOTHROID) 75 MCG tablet Take 75 mcg by mouth every morning.    Yes Historical Provider, MD  linagliptin (TRADJENTA) 5 MG TABS tablet Take 5 mg by mouth daily.   Yes Historical Provider, MD  metoprolol succinate (TOPROL-XL) 25 MG 24 hr tablet Take 25 mg by mouth every morning.    Yes Historical Provider, MD  Misc Natural Products (GREEN TEA SLIM) TABS Take 1 tablet by mouth daily.   Yes Historical Provider, MD  Multiple Vitamin (MULITIVITAMIN WITH MINERALS) TABS Take 1 tablet by mouth every morning.   Yes Historical Provider, MD  OVER THE COUNTER MEDICATION Take 1 capsule by mouth 3 (three) times daily. Vinegar capsule   Yes Historical Provider, MD  potassium chloride SA (K-DUR,KLOR-CON) 20 MEQ tablet Take 10 mEq by mouth daily.    Yes Historical Provider, MD  simvastatin (ZOCOR) 40 MG tablet Take 40 mg by mouth at  bedtime.    Yes Historical Provider, MD  traMADol (ULTRAM) 50 MG tablet Take 50 mg by mouth 3 (three) times daily. For pain    Yes Historical Provider, MD     Allergies:  Allergies  Allergen Reactions  . Procaine Hcl Anaphylaxis  . Codeine     REACTION: Reaction not known  . Latex   . Penicillins Rash    Social History:   reports that she has quit smoking. She does not have any smokeless tobacco history on file. She reports that she does not drink alcohol or use illicit drugs.  Family History: Family History  Problem Relation Age of Onset  . Heart disease Mother   . Stroke Mother   . Prostate cancer Father   . Cancer Father     bone  . Liver cancer Brother   . Cancer Brother     leukemia  . Alzheimer's disease      family history  . Cancer Daughter     breast  . Cancer Paternal Uncle  unsure of what kind     Physical Exam: Filed Vitals:   08/14/11 2105 08/14/11 2114 08/14/11 2223 08/14/11 2226  BP: 100/58  110/57   Pulse: 79   68  Temp:  98.7 F (37.1 C)    TempSrc:  Oral    Resp:    18  Height:      Weight:      SpO2: 98%   97%   Blood pressure 110/57, pulse 68, temperature 98.7 F (37.1 C), temperature source Oral, resp. rate 18, height 5\' 2"  (1.575 m), weight 74.844 kg (165 lb), SpO2 97.00%.  GEN:  Pleasant middle-aged Caucasian lady lying in the stretcher in no acute distress; cooperative with exam PSYCH:  alert and oriented x4;  appear somewhat anxious HEENT: Mucous membranes pink and anicteric; PERRLA; EOM intact; no cervical lymphadenopathy nor thyromegaly or carotid bruit; no JVD; Breasts:: Not examined CHEST WALL: No tenderness CHEST: Normal respiration, clear to auscultation bilaterally HEART: Regular rate and rhythm; no murmurs rubs or gallops BACK: mild  Kyphosis no scoliosis; no CVA tenderness ABDOMEN: Obese, soft non-tender; no masses, no organomegaly, normal abdominal bowel sounds;  Rectal Exam: Not done EXTREMITIES:  age-appropriate  arthropathy of the hands and knees; no edema; no ulcerations. Genitalia: not examined PULSES: 2+ and symmetric SKIN: Normal hydration no rash or ulceration CNS: Cranial nerves 2-12 grossly intact no focal lateralizingneurologic deficit   Labs & Imaging Results for orders placed during the hospital encounter of 08/14/11 (from the past 48 hour(s))  PRO B NATRIURETIC PEPTIDE     Status: Abnormal   Collection Time   08/14/11  4:10 PM      Component Value Range Comment   Pro B Natriuretic peptide (BNP) 2663.0 (*) 0 - 125 (pg/mL)   BASIC METABOLIC PANEL     Status: Abnormal   Collection Time   08/14/11  4:11 PM      Component Value Range Comment   Sodium 139  135 - 145 (mEq/L)    Potassium 4.3  3.5 - 5.1 (mEq/L)    Chloride 99  96 - 112 (mEq/L)    CO2 32  19 - 32 (mEq/L)    Glucose, Bld 154 (*) 70 - 99 (mg/dL)    BUN 17  6 - 23 (mg/dL)    Creatinine, Ser 9.56  0.50 - 1.10 (mg/dL)    Calcium 9.9  8.4 - 10.5 (mg/dL)    GFR calc non Af Amer 54 (*) >90 (mL/min)    GFR calc Af Amer 63 (*) >90 (mL/min)   CBC     Status: Normal   Collection Time   08/14/11  4:11 PM      Component Value Range Comment   WBC 5.5  4.0 - 10.5 (K/uL)    RBC 4.30  3.87 - 5.11 (MIL/uL)    Hemoglobin 12.6  12.0 - 15.0 (g/dL)    HCT 21.3  08.6 - 57.8 (%)    MCV 90.9  78.0 - 100.0 (fL)    MCH 29.3  26.0 - 34.0 (pg)    MCHC 32.2  30.0 - 36.0 (g/dL)    RDW 46.9  62.9 - 52.8 (%)    Platelets 290  150 - 400 (K/uL)   DIFFERENTIAL     Status: Normal   Collection Time   08/14/11  4:11 PM      Component Value Range Comment   Neutrophils Relative 54  43 - 77 (%)    Neutro Abs 2.9  1.7 - 7.7 (  K/uL)    Lymphocytes Relative 33  12 - 46 (%)    Lymphs Abs 1.8  0.7 - 4.0 (K/uL)    Monocytes Relative 11  3 - 12 (%)    Monocytes Absolute 0.6  0.1 - 1.0 (K/uL)    Eosinophils Relative 2  0 - 5 (%)    Eosinophils Absolute 0.1  0.0 - 0.7 (K/uL)    Basophils Relative 1  0 - 1 (%)    Basophils Absolute 0.0  0.0 - 0.1 (K/uL)     TROPONIN I     Status: Abnormal   Collection Time   08/14/11  4:11 PM      Component Value Range Comment   Troponin I 0.37 (*) <0.30 (ng/mL)   TROPONIN I     Status: Abnormal   Collection Time   08/14/11  6:41 PM      Component Value Range Comment   Troponin I 0.35 (*) <0.30 (ng/mL)    Dg Chest Portable 1 View  08/14/2011  *RADIOLOGY REPORT*  Clinical Data: Shortness of breath.  PORTABLE CHEST - 1 VIEW  Comparison: 07/18/2011.  Findings: The heart is borderline enlarged but stable.  The mediastinal and hilar contours are stable.  Slightly low lung volumes with mild vascular crowding and streaky basilar atelectasis.  No edema or effusions.  Stable calcified granuloma in the right lung.  IMPRESSION: Low lung volumes with vascular crowding and streaky bibasilar atelectasis.  Original Report Authenticated By: P. Loralie Champagne, M.D.    EKG shows abnormalities in the anterior and lateral leads, not significantly changed from the EKG of 2012   Assessment Present on Admission:  .Unstable angina  .CHF (congestive heart failure) .HYPERLIPIDEMIA .COPD .DIABETES MELLITUS .Essential hypertension, benign .CHEST PAIN, PRECORDIAL .Anxiety and depression   PLAN: Bring this lady is a known unstable angina protocol, will load her with Plavix but will not give full anti-coagulation. Will continue aspirin and nitrates;, will continue her current dose of beta blockers; she has a history of normal ejection fraction therefore will withhold ACE inhibitors for the time being pending evaluation by her cardiologist.  Maryclare Labrador continue outpatient management of her chronic medical conditions as noted in the admission orders  Other plans as per orders.   Mykaylah Ballman 08/14/2011, 11:26 PM

## 2011-08-14 NOTE — ED Notes (Signed)
Pt up to bathroom, placed back on monitor & O2

## 2011-08-14 NOTE — ED Provider Notes (Signed)
History     CSN: [id number]  Arrival date & time 08/14/11  1535   First MD Initiated Contact with Patient 08/14/11 1552      Chief Complaint  Patient presents with  . Shortness of Breath    (Consider location/radiation/quality/duration/timing/severity/associated sxs/prior treatment) Patient is a 66 y.o. female presenting with shortness of breath. The history is provided by the patient (the patient states that she's had chest pain today for about an hour with some weakness). No language interpreter was used.  Shortness of Breath  The current episode started today. The problem occurs rarely. The problem has been resolved. The problem is moderate. The symptoms are relieved by nothing. The symptoms are aggravated by nothing. Associated symptoms include shortness of breath. Pertinent negatives include no chest pain and no cough. There was no intake of a foreign body. She has had no prior steroid use. She has had prior hospitalizations. She has had no prior ICU admissions.    Past Medical History  Diagnosis Date  . Hypertension   . Diabetes mellitus   . Arthritis   . Blood transfusion   . Hx pulmonary embolism   . Anemia   . COPD (chronic obstructive pulmonary disease)   . Depression   . Anxiety   . Gastroesophageal reflux disease   . Osteoporosis   . DDD (degenerative disc disease)   . Uterine prolapse   . Uterine prolapse   . Enlarged heart   . Asthma   . CHF (congestive heart failure)   . Emphysema of lung   . Heart murmur   . Hyperlipidemia   . Thyroid disease     Past Surgical History  Procedure Date  . Hiatel hernia repair   . Tubal ligation   . Colonoscopy w/ polypectomy     Family History  Problem Relation Age of Onset  . Heart disease Mother   . Stroke Mother   . Prostate cancer Father   . Cancer Father     bone  . Liver cancer Brother   . Cancer Brother     leukemia  . Alzheimer's disease      family history  . Cancer Daughter     breast  . Cancer  Paternal Uncle     unsure of what kind    History  Substance Use Topics  . Smoking status: Former Games developer  . Smokeless tobacco: Not on file   Comment: quit in 1996  . Alcohol Use: No    OB History    Grav Para Term Preterm Abortions TAB SAB Ect Mult Living                  Review of Systems  Constitutional: Positive for fatigue.  HENT: Negative for congestion, sinus pressure and ear discharge.   Eyes: Negative for discharge.  Respiratory: Positive for shortness of breath. Negative for cough.   Cardiovascular: Negative for chest pain.  Gastrointestinal: Negative for abdominal pain and diarrhea.  Genitourinary: Negative for frequency and hematuria.  Musculoskeletal: Negative for back pain.  Skin: Negative for rash.  Neurological: Negative for seizures and headaches.  Hematological: Negative.   Psychiatric/Behavioral: Negative for hallucinations.    Allergies  Procaine hcl; Codeine; Latex; and Penicillins  Home Medications   Current Outpatient Rx  Name Route Sig Dispense Refill  . ALBUTEROL SULFATE HFA 108 (90 BASE) MCG/ACT IN AERS Inhalation Inhale 2 puffs into the lungs every 6 (six) hours as needed.    Marland Kitchen AMITRIPTYLINE HCL 25 MG PO  TABS Oral Take 25 mg by mouth at bedtime.      . ASPIRIN 81 MG PO CHEW Oral Chew 81 mg by mouth every morning.    . CHOLINE FENOFIBRATE 135 MG PO CPDR Oral Take 135 mg by mouth every morning.     Marland Kitchen DIAZEPAM 10 MG PO TABS Oral Take 10 mg by mouth 3 (three) times daily.     Marland Kitchen ESCITALOPRAM OXALATE 20 MG PO TABS Oral Take 20 mg by mouth every morning.     Marland Kitchen OMEGA-3 FATTY ACIDS 1000 MG PO CAPS Oral Take 1 g by mouth every morning.     . FUROSEMIDE 40 MG PO TABS Oral Take 20 mg by mouth daily.     Marland Kitchen GABAPENTIN 300 MG PO CAPS Oral Take 300 mg by mouth at bedtime and may repeat dose one time if needed.    Marland Kitchen GLIMEPIRIDE 1 MG PO TABS Oral Take 2 tablets (2 mg total) by mouth daily before breakfast. 30 tablet 0  . IPRATROPIUM-ALBUTEROL 0.5-2.5 (3)  MG/3ML IN SOLN Nebulization Take 3 mLs by nebulization every 4 (four) hours as needed. For shortness of breath     . LEVOTHYROXINE SODIUM 75 MCG PO TABS Oral Take 75 mcg by mouth every morning.     Marland Kitchen LINAGLIPTIN 5 MG PO TABS Oral Take 5 mg by mouth daily.    Marland Kitchen METOPROLOL SUCCINATE ER 25 MG PO TB24 Oral Take 25 mg by mouth every morning.     Marland Kitchen GREEN TEA SLIM PO TABS Oral Take 1 tablet by mouth daily.    . ADULT MULTIVITAMIN W/MINERALS CH Oral Take 1 tablet by mouth every morning.    Marland Kitchen OVER THE COUNTER MEDICATION Oral Take 1 capsule by mouth 3 (three) times daily. Vinegar capsule    . POTASSIUM CHLORIDE CRYS ER 20 MEQ PO TBCR Oral Take 10 mEq by mouth daily.     Marland Kitchen SIMVASTATIN 40 MG PO TABS Oral Take 40 mg by mouth at bedtime.     . TRAMADOL HCL 50 MG PO TABS Oral Take 50 mg by mouth 3 (three) times daily. For pain       BP 100/58  Pulse 79  Temp(Src) 98.7 F (37.1 C) (Oral)  Resp 19  Ht 5\' 2"  (1.575 m)  Wt 165 lb (74.844 kg)  BMI 30.18 kg/m2  SpO2 98%  Physical Exam  Constitutional: She is oriented to person, place, and time. She appears well-developed.  HENT:  Head: Normocephalic and atraumatic.  Eyes: Conjunctivae and EOM are normal. No scleral icterus.  Neck: Neck supple. No thyromegaly present.  Cardiovascular: Normal rate and regular rhythm.  Exam reveals no gallop and no friction rub.   No murmur heard. Pulmonary/Chest: No stridor. She has no wheezes. She has no rales. She exhibits no tenderness.  Abdominal: She exhibits no distension. There is no tenderness. There is no rebound.  Musculoskeletal: Normal range of motion. She exhibits no edema.  Lymphadenopathy:    She has no cervical adenopathy.  Neurological: She is oriented to person, place, and time. Coordination normal.  Skin: No rash noted. No erythema.  Psychiatric: She has a normal mood and affect. Her behavior is normal.    ED Course  Procedures (including critical care time)  Labs Reviewed  PRO B NATRIURETIC  PEPTIDE - Abnormal; Notable for the following:    Pro B Natriuretic peptide (BNP) 2663.0 (*)    All other components within normal limits  BASIC METABOLIC PANEL - Abnormal; Notable for  the following:    Glucose, Bld 154 (*)    GFR calc non Af Amer 54 (*)    GFR calc Af Amer 63 (*)    All other components within normal limits  TROPONIN I - Abnormal; Notable for the following:    Troponin I 0.37 (*)    All other components within normal limits  TROPONIN I - Abnormal; Notable for the following:    Troponin I 0.35 (*)    All other components within normal limits  CBC  DIFFERENTIAL   Dg Chest Portable 1 View  08/14/2011  *RADIOLOGY REPORT*  Clinical Data: Shortness of breath.  PORTABLE CHEST - 1 VIEW  Comparison: 07/18/2011.  Findings: The heart is borderline enlarged but stable.  The mediastinal and hilar contours are stable.  Slightly low lung volumes with mild vascular crowding and streaky basilar atelectasis.  No edema or effusions.  Stable calcified granuloma in the right lung.  IMPRESSION: Low lung volumes with vascular crowding and streaky bibasilar atelectasis.  Original Report Authenticated By: P. Loralie Champagne, M.D.     1. Chest pain     Date: 08/14/2011  Rate: 70  Rhythm: normal sinus rhythm  QRS Axis: normal  Intervals: normal  ST/T Wave abnormalities: nonspecific ST changes  Conduction Disutrbances:none  Narrative Interpretation:   Old EKG Reviewed: unchanged    Pt will be admitted to [facility]. MDM          Benny Lennert, MD 08/14/11 2125

## 2011-08-15 ENCOUNTER — Other Ambulatory Visit: Payer: Self-pay

## 2011-08-15 ENCOUNTER — Encounter (HOSPITAL_COMMUNITY): Payer: Self-pay | Admitting: *Deleted

## 2011-08-15 LAB — LIPID PANEL
Cholesterol: 109 mg/dL (ref 0–200)
LDL Cholesterol: 48 mg/dL (ref 0–99)
Total CHOL/HDL Ratio: 2.5 RATIO
Triglycerides: 88 mg/dL (ref <150)
VLDL: 18 mg/dL (ref 0–40)

## 2011-08-15 LAB — HEMOGLOBIN A1C: Hgb A1c MFr Bld: 6.3 % — ABNORMAL HIGH (ref <5.7)

## 2011-08-15 LAB — CBC
Hemoglobin: 13 g/dL (ref 12.0–15.0)
MCH: 29.2 pg (ref 26.0–34.0)
MCH: 29.1 pg (ref 26.0–34.0)
MCHC: 32.3 g/dL (ref 30.0–36.0)
MCV: 90.5 fL (ref 78.0–100.0)
MCV: 91.1 fL (ref 78.0–100.0)
Platelets: 272 10*3/uL (ref 150–400)
RBC: 4.47 MIL/uL (ref 3.87–5.11)
WBC: 5 10*3/uL (ref 4.0–10.5)

## 2011-08-15 LAB — BASIC METABOLIC PANEL
BUN: 20 mg/dL (ref 6–23)
CO2: 31 mEq/L (ref 19–32)
CO2: 30 mEq/L (ref 19–32)
Calcium: 9.4 mg/dL (ref 8.4–10.5)
Chloride: 105 mEq/L (ref 96–112)
Chloride: 101 mEq/L (ref 96–112)
Creatinine, Ser: 1.14 mg/dL — ABNORMAL HIGH (ref 0.50–1.10)
Creatinine, Ser: 0.99 mg/dL (ref 0.50–1.10)
Glucose, Bld: 133 mg/dL — ABNORMAL HIGH (ref 70–99)

## 2011-08-15 LAB — CREATININE, SERUM: Creatinine, Ser: 1.04 mg/dL (ref 0.50–1.10)

## 2011-08-15 LAB — GLUCOSE, CAPILLARY
Glucose-Capillary: 103 mg/dL — ABNORMAL HIGH (ref 70–99)
Glucose-Capillary: 119 mg/dL — ABNORMAL HIGH (ref 70–99)
Glucose-Capillary: 117 mg/dL — ABNORMAL HIGH (ref 70–99)
Glucose-Capillary: 126 mg/dL — ABNORMAL HIGH (ref 70–99)

## 2011-08-15 LAB — CARDIAC PANEL(CRET KIN+CKTOT+MB+TROPI)
CK, MB: 2.1 ng/mL (ref 0.3–4.0)
CK, MB: 2.1 ng/mL (ref 0.3–4.0)
Relative Index: INVALID (ref 0.0–2.5)
Relative Index: INVALID (ref 0.0–2.5)
Total CK: 53 U/L (ref 7–177)
Total CK: 54 U/L (ref 7–177)
Troponin I: <0.3 ng/mL (ref <0.30)

## 2011-08-15 MED ORDER — DIAZEPAM 5 MG PO TABS
10.0000 mg | ORAL_TABLET | Freq: Three times a day (TID) | ORAL | Status: DC | PRN
  Administered 2011-08-15 – 2011-08-16 (×2): 10 mg via ORAL
  Filled 2011-08-15 (×2): qty 1
  Filled 2011-08-15: qty 2

## 2011-08-15 MED ORDER — INSULIN ASPART 100 UNIT/ML ~~LOC~~ SOLN: 0.0000 [IU] | Freq: Every day | SUBCUTANEOUS | Status: DC

## 2011-08-15 MED ORDER — METOPROLOL SUCCINATE ER 25 MG PO TB24
25.0000 mg | ORAL_TABLET | ORAL | Status: DC
  Administered 2011-08-15 – 2011-08-21 (×7): 25 mg via ORAL
  Filled 2011-08-15 (×8): qty 1

## 2011-08-15 MED ORDER — SIMVASTATIN 40 MG PO TABS
40.0000 mg | ORAL_TABLET | Freq: Every day | ORAL | Status: DC
  Administered 2011-08-15 – 2011-08-20 (×6): 40 mg via ORAL
  Filled 2011-08-15 (×7): qty 1

## 2011-08-15 MED ORDER — ONDANSETRON HCL 4 MG/2ML IJ SOLN: 4.0000 mg | Freq: Four times a day (QID) | INTRAMUSCULAR | Status: DC | PRN

## 2011-08-15 MED ORDER — ASPIRIN 81 MG PO CHEW
324.0000 mg | CHEWABLE_TABLET | ORAL | Status: AC
  Administered 2011-08-15: 324 mg via ORAL
  Filled 2011-08-15: qty 4

## 2011-08-15 MED ORDER — ALBUTEROL SULFATE HFA 108 (90 BASE) MCG/ACT IN AERS: 2.0000 | INHALATION_SPRAY | Freq: Four times a day (QID) | RESPIRATORY_TRACT | Status: DC | PRN

## 2011-08-15 MED ORDER — SODIUM CHLORIDE 0.9 % IV SOLN
1.0000 mL/kg/h | INTRAVENOUS | Status: DC
  Administered 2011-08-16: 1 mL/kg/h via INTRAVENOUS

## 2011-08-15 MED ORDER — CLOPIDOGREL BISULFATE 75 MG PO TABS
300.0000 mg | ORAL_TABLET | Freq: Once | ORAL | Status: AC
  Administered 2011-08-15: 300 mg via ORAL
  Filled 2011-08-15: qty 4

## 2011-08-15 MED ORDER — GABAPENTIN 300 MG PO CAPS
300.0000 mg | ORAL_CAPSULE | Freq: Every evening | ORAL | Status: DC | PRN
  Administered 2011-08-15 – 2011-08-20 (×6): 300 mg via ORAL
  Filled 2011-08-15 (×14): qty 1

## 2011-08-15 MED ORDER — POTASSIUM CHLORIDE CRYS ER 10 MEQ PO TBCR
10.0000 meq | EXTENDED_RELEASE_TABLET | Freq: Every day | ORAL | Status: DC
  Administered 2011-08-15 – 2011-08-21 (×7): 10 meq via ORAL
  Filled 2011-08-15 (×7): qty 1

## 2011-08-15 MED ORDER — FUROSEMIDE 10 MG/ML IJ SOLN
40.0000 mg | Freq: Two times a day (BID) | INTRAMUSCULAR | Status: DC
  Administered 2011-08-15 – 2011-08-17 (×5): 40 mg via INTRAVENOUS
  Filled 2011-08-15 (×7): qty 4

## 2011-08-15 MED ORDER — FUROSEMIDE 20 MG PO TABS
20.0000 mg | ORAL_TABLET | Freq: Every day | ORAL | Status: DC
  Filled 2011-08-15: qty 1

## 2011-08-15 MED ORDER — CLOPIDOGREL BISULFATE 75 MG PO TABS
75.0000 mg | ORAL_TABLET | Freq: Every day | ORAL | Status: DC
  Administered 2011-08-15 – 2011-08-18 (×4): 75 mg via ORAL
  Filled 2011-08-15 (×4): qty 1

## 2011-08-15 MED ORDER — ENOXAPARIN SODIUM 40 MG/0.4ML ~~LOC~~ SOLN
40.0000 mg | SUBCUTANEOUS | Status: DC
  Administered 2011-08-15 – 2011-08-17 (×2): 40 mg via SUBCUTANEOUS
  Filled 2011-08-15 (×4): qty 0.4

## 2011-08-15 MED ORDER — NITROGLYCERIN 2 % TD OINT: 1.0000 [in_us] | TOPICAL_OINTMENT | Freq: Four times a day (QID) | TRANSDERMAL | Status: DC

## 2011-08-15 MED ORDER — ASPIRIN 81 MG PO CHEW
324.0000 mg | CHEWABLE_TABLET | ORAL | Status: AC
  Administered 2011-08-16: 324 mg via ORAL
  Filled 2011-08-15: qty 4

## 2011-08-15 MED ORDER — OMEGA-3-ACID ETHYL ESTERS 1 G PO CAPS
1.0000 g | ORAL_CAPSULE | Freq: Every day | ORAL | Status: DC
  Administered 2011-08-15 – 2011-08-21 (×7): 1 g via ORAL
  Filled 2011-08-15 (×8): qty 1

## 2011-08-15 MED ORDER — ASPIRIN 81 MG PO CHEW
81.0000 mg | CHEWABLE_TABLET | ORAL | Status: DC
  Administered 2011-08-17 – 2011-08-21 (×5): 81 mg via ORAL
  Filled 2011-08-15 (×6): qty 1

## 2011-08-15 MED ORDER — OMEGA-3 FATTY ACIDS 1000 MG PO CAPS: 1.0000 g | ORAL_CAPSULE | ORAL | Status: DC

## 2011-08-15 MED ORDER — TRAMADOL HCL 50 MG PO TABS
50.0000 mg | ORAL_TABLET | Freq: Three times a day (TID) | ORAL | Status: DC
  Administered 2011-08-15 – 2011-08-19 (×14): 50 mg via ORAL
  Filled 2011-08-15 (×14): qty 1

## 2011-08-15 MED ORDER — INSULIN ASPART 100 UNIT/ML ~~LOC~~ SOLN
0.0000 [IU] | Freq: Three times a day (TID) | SUBCUTANEOUS | Status: DC
  Administered 2011-08-15 – 2011-08-16 (×2): 1 [IU] via SUBCUTANEOUS
  Administered 2011-08-17: 2 [IU] via SUBCUTANEOUS
  Administered 2011-08-18 – 2011-08-19 (×3): 1 [IU] via SUBCUTANEOUS
  Administered 2011-08-19 – 2011-08-21 (×3): 2 [IU] via SUBCUTANEOUS
  Administered 2011-08-21: 1 [IU] via SUBCUTANEOUS
  Filled 2011-08-15: qty 3

## 2011-08-15 MED ORDER — AMITRIPTYLINE HCL 25 MG PO TABS
25.0000 mg | ORAL_TABLET | Freq: Every day | ORAL | Status: DC
  Administered 2011-08-15 – 2011-08-20 (×6): 25 mg via ORAL
  Filled 2011-08-15 (×7): qty 1

## 2011-08-15 MED ORDER — ACETAMINOPHEN 325 MG PO TABS: 650.0000 mg | ORAL_TABLET | ORAL | Status: DC | PRN

## 2011-08-15 MED ORDER — ISOSORBIDE DINITRATE 10 MG PO TABS
10.0000 mg | ORAL_TABLET | Freq: Three times a day (TID) | ORAL | Status: DC
  Administered 2011-08-15 – 2011-08-18 (×9): 10 mg via ORAL
  Filled 2011-08-15 (×12): qty 1

## 2011-08-15 MED ORDER — FENOFIBRATE 160 MG PO TABS
160.0000 mg | ORAL_TABLET | Freq: Every day | ORAL | Status: DC
  Administered 2011-08-15 – 2011-08-20 (×6): 160 mg via ORAL
  Filled 2011-08-15 (×7): qty 1

## 2011-08-15 MED ORDER — DIAZEPAM 5 MG PO TABS
5.0000 mg | ORAL_TABLET | ORAL | Status: AC
  Administered 2011-08-16: 5 mg via ORAL
  Filled 2011-08-15: qty 1

## 2011-08-15 MED ORDER — GLIMEPIRIDE 2 MG PO TABS
2.0000 mg | ORAL_TABLET | Freq: Every day | ORAL | Status: DC
  Administered 2011-08-15 – 2011-08-21 (×7): 2 mg via ORAL
  Filled 2011-08-15 (×8): qty 1

## 2011-08-15 MED ORDER — LEVOTHYROXINE SODIUM 75 MCG PO TABS
75.0000 ug | ORAL_TABLET | ORAL | Status: DC
  Administered 2011-08-15 – 2011-08-21 (×7): 75 ug via ORAL
  Filled 2011-08-15 (×8): qty 1

## 2011-08-15 MED ORDER — ASPIRIN 300 MG RE SUPP: 300.0000 mg | RECTAL | Status: AC

## 2011-08-15 MED ORDER — ESCITALOPRAM OXALATE 20 MG PO TABS
20.0000 mg | ORAL_TABLET | ORAL | Status: DC
  Administered 2011-08-15 – 2011-08-21 (×7): 20 mg via ORAL
  Filled 2011-08-15 (×8): qty 1

## 2011-08-15 NOTE — Progress Notes (Signed)
  Echocardiogram 2D Echocardiogram has been performed.  [name] 08/15/2011, 3:44 PM

## 2011-08-15 NOTE — Progress Notes (Signed)
Patient ID: [name], female   DOB: Apr 07, 1946, 66 y.o.   MRN: [id number] Subjective: No events overnight. Called by RN that the patient had 9 beats of V tach. Patient was seen and examined at bedside and has had complaint of chest pain.  Objective:  Vital signs in last 24 hours:  Filed Vitals:   08/15/11 0120 08/15/11 0142 08/15/11 0500 08/15/11 1034  BP: 125/71  101/66 126/76  Pulse: 60   76  Temp: 97.8 F (36.6 C)  97.8 F (36.6 C)   TempSrc: Oral  Oral   Resp: 20  20   Height:      Weight:      SpO2: 94% 92% 92%     Intake/Output from previous day:  No intake or output data in the 24 hours ending 08/15/11 1235  Physical Exam: General: Alert, awake, oriented x3, in no acute distress. HEENT: No bruits, no goiter. Moist mucous membranes, no scleral icterus, no conjunctival pallor. Heart: Regular rate and rhythm, S1/S2 +, no murmurs, rubs, gallops. Lungs: Clear to auscultation bilaterally. No wheezing, no rhonchi, no rales.  Abdomen: Soft, nontender, nondistended, positive bowel sounds. Extremities: No clubbing or cyanosis, no pitting edema,  positive pedal pulses. Neuro: Grossly nonfocal.  Lab Results:  Basic Metabolic Panel:    Component Value Date/Time   NA 143 08/15/2011 0645   K 4.0 08/15/2011 0645   CL 105 08/15/2011 0645   CO2 31 08/15/2011 0645   BUN 20 08/15/2011 0645   CREATININE 1.14* 08/15/2011 0645   GLUCOSE 133* 08/15/2011 0645   CALCIUM 9.4 08/15/2011 0645   CBC:    Component Value Date/Time   WBC 5.4 08/15/2011 0209   HGB 12.3 08/15/2011 0209   HCT 38.1 08/15/2011 0209   PLT 272 08/15/2011 0209   MCV 90.5 08/15/2011 0209   NEUTROABS 2.9 08/14/2011 1611   LYMPHSABS 1.8 08/14/2011 1611   MONOABS 0.6 08/14/2011 1611   EOSABS 0.1 08/14/2011 1611   BASOSABS 0.0 08/14/2011 1611      Lab 08/15/11 0209 08/14/11 1611  WBC 5.4 5.5  HGB 12.3 12.6  HCT 38.1 39.1  PLT 272 290  MCV 90.5 90.9  MCH 29.2 29.3  MCHC 32.3 32.2  RDW 15.6* 15.3  LYMPHSABS -- 1.8  MONOABS --  0.6  EOSABS -- 0.1  BASOSABS -- 0.0  BANDABS -- --    Lab 08/15/11 0645 08/15/11 0209 08/14/11 1611  NA 143 -- 139  K 4.0 -- 4.3  CL 105 -- 99  CO2 31 -- 32  GLUCOSE 133* -- 154*  BUN 20 -- 17  CREATININE 1.14* 1.04 1.07  CALCIUM 9.4 -- 9.9  MG -- -- --   No results found for this basename: INR:5,PROTIME:5 in the last 168 hours Cardiac markers:  Lab 08/15/11 0843 08/15/11 0208 08/14/11 1841  CKMB 2.2 2.1 --  TROPONINI <0.30 <0.30 0.35*  MYOGLOBIN -- -- --   No components found with this basename: POCBNP:3 No results found for this or any previous visit (from the past 240 hour(s)).  Studies/Results: Dg Chest Portable 1 View  08/14/2011  *RADIOLOGY REPORT*  Clinical Data: Shortness of breath.  PORTABLE CHEST - 1 VIEW  Comparison: 07/18/2011.  Findings: The heart is borderline enlarged but stable.  The mediastinal and hilar contours are stable.  Slightly low lung volumes with mild vascular crowding and streaky basilar atelectasis.  No edema or effusions.  Stable calcified granuloma in the right lung.  IMPRESSION: Low lung volumes with vascular  crowding and streaky bibasilar atelectasis.  Original Report Authenticated By: P. Loralie Champagne, M.D.    Medications: Scheduled Meds:   . amitriptyline  25 mg Oral QHS  . aspirin  324 mg Oral NOW   Or  . aspirin  300 mg Rectal NOW  . aspirin  324 mg Oral Pre-Cath  . aspirin  81 mg Oral Q24H  . clopidogrel  300 mg Oral Once  . clopidogrel  75 mg Oral Q breakfast  . diazepam  5 mg Oral On Call  . enoxaparin  40 mg Subcutaneous Q24H  . escitalopram  20 mg Oral Q24H  . fenofibrate  160 mg Oral QHS  . furosemide  40 mg Intravenous BID  . gabapentin  300 mg Oral QHS,MR X 1  . glimepiride  2 mg Oral QAC breakfast  . insulin aspart  0-5 Units Subcutaneous QHS  . insulin aspart  0-9 Units Subcutaneous TID WC  . isosorbide dinitrate  10 mg Oral TID  . levothyroxine  75 mcg Oral Q24H  . metoprolol succinate  25 mg Oral Q24H  .  nitroGLYCERIN  1 inch Topical Once  . omega-3 acid ethyl esters  1 g Oral Daily  . potassium chloride SA  10 mEq Oral Daily  . simvastatin  40 mg Oral q1800  . traMADol  50 mg Oral TID  . DISCONTD: fish oil-omega-3 fatty acids  1 g Oral BH-q7a  . DISCONTD: furosemide  20 mg Oral Daily  . DISCONTD: nitroGLYCERIN  1 inch Topical Q6H   Continuous Infusions:   . sodium chloride     PRN Meds:.acetaminophen, albuterol, diazepam, ondansetron (ZOFRAN) IV  Assessment/Plan:  Principal Problem:   *NSTEMI (non-ST elevated myocardial infarction) - cardiology consult called - plan as per cardio - cardio team will assume the care of this patient from today  Active Problems:   CHEST PAIN, PRECORDIAL - perhaps secondary to NSTEMI / acute on chronic systolic and diastolic CHF    EDUCATION - test results and diagnostic studies were discussed with patient  at the bedside - patient has verbalized the understanding - questions were answered at the bedside and contact information was provided for additional questions or concerns   LOS: 1 day   [name] 08/15/2011, 12:35 PM  [facility] Pager: [phone]

## 2011-08-15 NOTE — ED Notes (Signed)
Report called to Fabio Neighbors RN at [facility]. [facility] here to transport PT, PT in stable condition.

## 2011-08-15 NOTE — Consult Note (Signed)
[facility]       CONSULTATION NOTE   Reason for Consult: Chest pain, shortness of breath  Requesting Physician: Dr. Orvan Falconer  Cardiologist: Dr. Alanda Amass  HPI: This is a 66 y.o. female with a past medical history significant for diastolic heart failure, diabetes, hypertension, anxiety, depression, and palpitations. She was admitted last night with a dull aching pressure or chest, profuse diaphoresis, palpitations and a feeling that she was going to pass out. She also had dizziness which last about 5 minutes. In addition her EKG showed mild T-wave changes and the troponin was elevated slightly 0.37 (0.30 is the upper level of normal). BNP was elevated at 2663. She was previously followed by Dr. Antoine Poche of [facility] cardiology who saw her in the office last summer and did not were recommended he followup. Subsequently she's been admitted to the hospital 3 times in the last 2 months, with heart failure exacerbations. She did undergo nuclear stress test last summer which demonstrated no ischemia and an EF of 83%. Recently she has complained of right upper quadrant pain which is concerning for cholecystitis however a gallbladder emptying scan was negative. She was then referred to Dr. Alanda Amass who had recommended an echocardiogram and renal Dopplers.   PMHx:  Past Medical History  Diagnosis Date  . Hypertension   . Diabetes mellitus   . Arthritis   . Blood transfusion   . Hx pulmonary embolism   . Anemia   . COPD (chronic obstructive pulmonary disease)   . Depression   . Anxiety   . Gastroesophageal reflux disease   . Osteoporosis   . DDD (degenerative disc disease)   . Uterine prolapse   . Uterine prolapse   . Enlarged heart   . Asthma   . CHF (congestive heart failure)   . Emphysema of lung   . Heart murmur   . Hyperlipidemia   . Thyroid disease   . Shortness of breath   . Chronic kidney disease     lt kidney limited fxn  . Seizures     once   . Headache   . Dysrhythmia    Past Surgical History  Procedure Date  . Hiatel hernia repair   . Tubal ligation     FAMHx: Family History  Problem Relation Age of Onset  . Heart disease Mother   . Stroke Mother   . Prostate cancer Father   . Cancer Father     bone  . Liver cancer Brother   . Cancer Brother     leukemia  . Alzheimer's disease      family history  . Cancer Daughter     breast  . Cancer Paternal Uncle     unsure of what kind    SOCHx:  reports that she has quit smoking. She does not have any smokeless tobacco history on file. She reports that she does not drink alcohol or use illicit drugs.  ALLERGIES: Allergies  Allergen Reactions  . Procaine Hcl Anaphylaxis  . Codeine     REACTION: Reaction not known  . Latex   . Penicillins Rash    ROS: A comprehensive review of systems was negative except for: Constitutional: positive for fatigue Respiratory: positive for dyspnea on exertion Cardiovascular: positive for chest pain and palpitations Gastrointestinal: positive for nausea  HOME MEDICATIONS: Prescriptions prior to admission  Medication Sig Dispense Refill  . albuterol (PROVENTIL HFA;VENTOLIN HFA) 108 (90 BASE) MCG/ACT inhaler Inhale 2 puffs into the  lungs every 6 (six) hours as needed.      Marland Kitchen amitriptyline (ELAVIL) 25 MG tablet Take 25 mg by mouth at bedtime.        Marland Kitchen aspirin 81 MG chewable tablet Chew 81 mg by mouth every morning.      . Choline Fenofibrate (TRILIPIX) 135 MG capsule Take 135 mg by mouth every morning.       . diazepam (VALIUM) 10 MG tablet Take 10 mg by mouth 3 (three) times daily.       Marland Kitchen escitalopram (LEXAPRO) 20 MG tablet Take 20 mg by mouth every morning.       . fish oil-omega-3 fatty acids 1000 MG capsule Take 1 g by mouth every morning.       . furosemide (LASIX) 40 MG tablet Take 20 mg by mouth daily.       Marland Kitchen gabapentin (NEURONTIN) 300 MG capsule Take 300 mg by mouth at bedtime and may repeat dose one time if needed.       Marland Kitchen glimepiride (AMARYL) 1 MG tablet Take 2 tablets (2 mg total) by mouth daily before breakfast.  30 tablet  0  . ipratropium-albuterol (DUONEB) 0.5-2.5 (3) MG/3ML SOLN Take 3 mLs by nebulization every 4 (four) hours as needed. For shortness of breath       . levothyroxine (SYNTHROID, LEVOTHROID) 75 MCG tablet Take 75 mcg by mouth every morning.       . linagliptin (TRADJENTA) 5 MG TABS tablet Take 5 mg by mouth daily.      . metoprolol succinate (TOPROL-XL) 25 MG 24 hr tablet Take 25 mg by mouth every morning.       . Misc Natural Products (GREEN TEA SLIM) TABS Take 1 tablet by mouth daily.      . Multiple Vitamin (MULITIVITAMIN WITH MINERALS) TABS Take 1 tablet by mouth every morning.      Marland Kitchen OVER THE COUNTER MEDICATION Take 1 capsule by mouth 3 (three) times daily. Vinegar capsule      . potassium chloride SA (K-DUR,KLOR-CON) 20 MEQ tablet Take 10 mEq by mouth daily.       . simvastatin (ZOCOR) 40 MG tablet Take 40 mg by mouth at bedtime.       . traMADol (ULTRAM) 50 MG tablet Take 50 mg by mouth 3 (three) times daily. For pain         HOSPITAL MEDICATIONS: Prior to Admission:  Prescriptions prior to admission  Medication Sig Dispense Refill  . albuterol (PROVENTIL HFA;VENTOLIN HFA) 108 (90 BASE) MCG/ACT inhaler Inhale 2 puffs into the lungs every 6 (six) hours as needed.      Marland Kitchen amitriptyline (ELAVIL) 25 MG tablet Take 25 mg by mouth at bedtime.        Marland Kitchen aspirin 81 MG chewable tablet Chew 81 mg by mouth every morning.      . Choline Fenofibrate (TRILIPIX) 135 MG capsule Take 135 mg by mouth every morning.       . diazepam (VALIUM) 10 MG tablet Take 10 mg by mouth 3 (three) times daily.       Marland Kitchen escitalopram (LEXAPRO) 20 MG tablet Take 20 mg by mouth every morning.       . fish oil-omega-3 fatty acids 1000 MG capsule Take 1 g by mouth every morning.       . furosemide (LASIX) 40 MG tablet Take 20 mg by mouth daily.       Marland Kitchen gabapentin (NEURONTIN) 300 MG capsule Take 300 mg by  mouth at  bedtime and may repeat dose one time if needed.      Marland Kitchen glimepiride (AMARYL) 1 MG tablet Take 2 tablets (2 mg total) by mouth daily before breakfast.  30 tablet  0  . ipratropium-albuterol (DUONEB) 0.5-2.5 (3) MG/3ML SOLN Take 3 mLs by nebulization every 4 (four) hours as needed. For shortness of breath       . levothyroxine (SYNTHROID, LEVOTHROID) 75 MCG tablet Take 75 mcg by mouth every morning.       . linagliptin (TRADJENTA) 5 MG TABS tablet Take 5 mg by mouth daily.      . metoprolol succinate (TOPROL-XL) 25 MG 24 hr tablet Take 25 mg by mouth every morning.       . Misc Natural Products (GREEN TEA SLIM) TABS Take 1 tablet by mouth daily.      . Multiple Vitamin (MULITIVITAMIN WITH MINERALS) TABS Take 1 tablet by mouth every morning.      Marland Kitchen OVER THE COUNTER MEDICATION Take 1 capsule by mouth 3 (three) times daily. Vinegar capsule      . potassium chloride SA (K-DUR,KLOR-CON) 20 MEQ tablet Take 10 mEq by mouth daily.       . simvastatin (ZOCOR) 40 MG tablet Take 40 mg by mouth at bedtime.       . traMADol (ULTRAM) 50 MG tablet Take 50 mg by mouth 3 (three) times daily. For pain         VITALS: Blood pressure 101/66, pulse 60, temperature 97.8 F (36.6 C), temperature source Oral, resp. rate 20, height 5\' 2"  (1.575 m), weight 74.844 kg (165 lb), SpO2 92.00%.  PHYSICAL EXAM: General appearance: appears tired, slowed mentation Neck: no adenopathy, no carotid bruit, no JVD, supple, symmetrical, trachea midline and thyroid not enlarged, symmetric, no tenderness/mass/nodules Lungs: rales bibasilar Heart: regular rate and rhythm, S1, S2 normal, no murmur, click, rub or gallop Abdomen: soft, non-tender; bowel sounds normal; no masses,  no organomegaly Extremities: extremities normal, atraumatic, no cyanosis or edema Pulses: 2+ and symmetric Skin: Skin color, texture, turgor normal. No rashes or lesions Neurologic: Grossly normal  LABS: Results for orders placed during the hospital encounter  of 08/14/11 (from the past 48 hour(s))  PRO B NATRIURETIC PEPTIDE     Status: Abnormal   Collection Time   08/14/11  4:10 PM      Component Value Range Comment   Pro B Natriuretic peptide (BNP) 2663.0 (*) 0 - 125 (pg/mL)   BASIC METABOLIC PANEL     Status: Abnormal   Collection Time   08/14/11  4:11 PM      Component Value Range Comment   Sodium 139  135 - 145 (mEq/L)    Potassium 4.3  3.5 - 5.1 (mEq/L)    Chloride 99  96 - 112 (mEq/L)    CO2 32  19 - 32 (mEq/L)    Glucose, Bld 154 (*) 70 - 99 (mg/dL)    BUN 17  6 - 23 (mg/dL)    Creatinine, Ser 4.78  0.50 - 1.10 (mg/dL)    Calcium 9.9  8.4 - 10.5 (mg/dL)    GFR calc non Af Amer 54 (*) >90 (mL/min)    GFR calc Af Amer 63 (*) >90 (mL/min)   CBC     Status: Normal   Collection Time   08/14/11  4:11 PM      Component Value Range Comment   WBC 5.5  4.0 - 10.5 (K/uL)    RBC 4.30  3.87 -  5.11 (MIL/uL)    Hemoglobin 12.6  12.0 - 15.0 (g/dL)    HCT 62.1  30.8 - 65.7 (%)    MCV 90.9  78.0 - 100.0 (fL)    MCH 29.3  26.0 - 34.0 (pg)    MCHC 32.2  30.0 - 36.0 (g/dL)    RDW 84.6  96.2 - 95.2 (%)    Platelets 290  150 - 400 (K/uL)   DIFFERENTIAL     Status: Normal   Collection Time   08/14/11  4:11 PM      Component Value Range Comment   Neutrophils Relative 54  43 - 77 (%)    Neutro Abs 2.9  1.7 - 7.7 (K/uL)    Lymphocytes Relative 33  12 - 46 (%)    Lymphs Abs 1.8  0.7 - 4.0 (K/uL)    Monocytes Relative 11  3 - 12 (%)    Monocytes Absolute 0.6  0.1 - 1.0 (K/uL)    Eosinophils Relative 2  0 - 5 (%)    Eosinophils Absolute 0.1  0.0 - 0.7 (K/uL)    Basophils Relative 1  0 - 1 (%)    Basophils Absolute 0.0  0.0 - 0.1 (K/uL)   TROPONIN I     Status: Abnormal   Collection Time   08/14/11  4:11 PM      Component Value Range Comment   Troponin I 0.37 (*) <0.30 (ng/mL)   TROPONIN I     Status: Abnormal   Collection Time   08/14/11  6:41 PM      Component Value Range Comment   Troponin I 0.35 (*) <0.30 (ng/mL)   CARDIAC PANEL(CRET  KIN+CKTOT+MB+TROPI)     Status: Normal   Collection Time   08/15/11  2:08 AM      Component Value Range Comment   Total CK 58  7 - 177 (U/L)    CK, MB 2.1  0.3 - 4.0 (ng/mL)    Troponin I <0.30  <0.30 (ng/mL)    Relative Index RELATIVE INDEX IS INVALID  0.0 - 2.5    CBC     Status: Abnormal   Collection Time   08/15/11  2:09 AM      Component Value Range Comment   WBC 5.4  4.0 - 10.5 (K/uL)    RBC 4.21  3.87 - 5.11 (MIL/uL)    Hemoglobin 12.3  12.0 - 15.0 (g/dL)    HCT 84.1  32.4 - 40.1 (%)    MCV 90.5  78.0 - 100.0 (fL)    MCH 29.2  26.0 - 34.0 (pg)    MCHC 32.3  30.0 - 36.0 (g/dL)    RDW 02.7 (*) 25.3 - 15.5 (%)    Platelets 272  150 - 400 (K/uL)   CREATININE, SERUM     Status: Abnormal   Collection Time   08/15/11  2:09 AM      Component Value Range Comment   Creatinine, Ser 1.04  0.50 - 1.10 (mg/dL)    GFR calc non Af Amer 56 (*) >90 (mL/min)    GFR calc Af Amer 65 (*) >90 (mL/min)   GLUCOSE, CAPILLARY     Status: Abnormal   Collection Time   08/15/11  2:40 AM      Component Value Range Comment   Glucose-Capillary 103 (*) 70 - 99 (mg/dL)   BASIC METABOLIC PANEL     Status: Abnormal   Collection Time   08/15/11  6:45 AM  Component Value Range Comment   Sodium 143  135 - 145 (mEq/L)    Potassium 4.0  3.5 - 5.1 (mEq/L)    Chloride 105  96 - 112 (mEq/L)    CO2 31  19 - 32 (mEq/L)    Glucose, Bld 133 (*) 70 - 99 (mg/dL)    BUN 20  6 - 23 (mg/dL)    Creatinine, Ser 7.82 (*) 0.50 - 1.10 (mg/dL)    Calcium 9.4  8.4 - 10.5 (mg/dL)    GFR calc non Af Amer 50 (*) >90 (mL/min)    GFR calc Af Amer 58 (*) >90 (mL/min)   LIPID PANEL     Status: Normal   Collection Time   08/15/11  6:45 AM      Component Value Range Comment   Cholesterol 109  0 - 200 (mg/dL)    Triglycerides 88  <956 (mg/dL)    HDL 43  >21 (mg/dL)    Total CHOL/HDL Ratio 2.5      VLDL 18  0 - 40 (mg/dL)    LDL Cholesterol 48  0 - 99 (mg/dL)   GLUCOSE, CAPILLARY     Status: Abnormal   Collection Time   08/15/11   7:44 AM      Component Value Range Comment   Glucose-Capillary 122 (*) 70 - 99 (mg/dL)    Comment 1 Notify RN     CARDIAC PANEL(CRET KIN+CKTOT+MB+TROPI)     Status: Normal   Collection Time   08/15/11  8:43 AM      Component Value Range Comment   Total CK 53  7 - 177 (U/L)    CK, MB 2.2  0.3 - 4.0 (ng/mL)    Troponin I <0.30  <0.30 (ng/mL)    Relative Index RELATIVE INDEX IS INVALID  0.0 - 2.5      IMAGING: Dg Chest Portable 1 View  08/14/2011  *RADIOLOGY REPORT*  Clinical Data: Shortness of breath.  PORTABLE CHEST - 1 VIEW  Comparison: 07/18/2011.  Findings: The heart is borderline enlarged but stable.  The mediastinal and hilar contours are stable.  Slightly low lung volumes with mild vascular crowding and streaky basilar atelectasis.  No edema or effusions.  Stable calcified granuloma in the right lung.  IMPRESSION: Low lung volumes with vascular crowding and streaky bibasilar atelectasis.  Original Report Authenticated By: P. Loralie Champagne, M.D.    IMPRESSION: 1. Diastolic heart failure exacerbation 2. NSTEMI vs. Troponin elevation secondary to heart failure 3. COPD 4. Chest pain 5. Palpitations  RECOMMENDATION: 1. Will review 2D echocardiogram today. Negative stress test last year but persistent chest pain and small NSTEMI which may be related to CHF. Question is what is causing heart failure. Would recommend cardiac catheterization to definitively study coronary arteries as well as an abdominal aortogram with renal artery runoff to rule-out renovascular disease. There may be a right heart failure component secondary to COPD.  She has also had palpitations and per [place] notes, wore a monitor for some time which did not reveal a source.  We will assume care of this patient on our service.  Time Spent Directly with Patient: 30 minutes  Chrystie Nose, MD, [facility] Attending Cardiologist [facility]  Kristain Hu C 08/15/2011, 9:57 AM

## 2011-08-16 ENCOUNTER — Other Ambulatory Visit: Payer: Self-pay

## 2011-08-16 ENCOUNTER — Encounter (HOSPITAL_COMMUNITY)
Admission: EM | Disposition: A | Discharge status: Home / Self Care | Payer: Self-pay | Source: Home / Self Care | Attending: Internal Medicine

## 2011-08-16 HISTORY — PX: LEFT HEART CATHETERIZATION WITH CORONARY ANGIOGRAM: SHX5451

## 2011-08-16 LAB — GLUCOSE, CAPILLARY
Glucose-Capillary: 153 mg/dL — ABNORMAL HIGH (ref 70–99)
Glucose-Capillary: 113 mg/dL — ABNORMAL HIGH (ref 70–99)

## 2011-08-16 SURGERY — LEFT HEART CATHETERIZATION WITH CORONARY ANGIOGRAM
Anesthesia: LOCAL

## 2011-08-16 MED ORDER — SODIUM CHLORIDE 0.9 % IV SOLN
1.0000 mL/kg/h | INTRAVENOUS | Status: AC
  Administered 2011-08-16: 1 mL/kg/h via INTRAVENOUS

## 2011-08-16 MED ORDER — NITROGLYCERIN 0.2 MG/ML ON CALL CATH LAB
INTRAVENOUS | Status: AC
  Filled 2011-08-16: qty 1

## 2011-08-16 MED ORDER — SODIUM CHLORIDE 0.9 % IV SOLN: 250.0000 mL | INTRAVENOUS | Status: DC

## 2011-08-16 MED ORDER — VERAPAMIL HCL 2.5 MG/ML IV SOLN
INTRAVENOUS | Status: AC
  Filled 2011-08-16: qty 2

## 2011-08-16 MED ORDER — LIDOCAINE HCL (PF) 1 % IJ SOLN
INTRAMUSCULAR | Status: AC
  Filled 2011-08-16: qty 30

## 2011-08-16 MED ORDER — HEPARIN (PORCINE) IN NACL 2-0.9 UNIT/ML-% IJ SOLN
INTRAMUSCULAR | Status: AC
  Filled 2011-08-16: qty 2000

## 2011-08-16 MED ORDER — SODIUM CHLORIDE 0.9 % IJ SOLN: 3.0000 mL | INTRAMUSCULAR | Status: DC | PRN

## 2011-08-16 MED ORDER — SODIUM CHLORIDE 0.9 % IJ SOLN: 3.0000 mL | Freq: Two times a day (BID) | INTRAMUSCULAR | Status: DC

## 2011-08-16 NOTE — Progress Notes (Signed)
Simmons, Markayla Reichart F UR Completed. [phone]  

## 2011-08-16 NOTE — Progress Notes (Signed)
[facility] DAILY PROGRESS NOTE  NAME:  [name]   MRN: [id number] DOB:  12-Dec-1945   ADMIT DATE: 08/14/2011   Patient Description   66 y.o. female with PMH below: She presented with dull aching pressure in chest, diaphoresis & palpitatations - near syncope / dizziness.  Mild T wave changes on ECG with mildly + Troponin & BNP of ~2600.    Past Medical History  Diagnosis Date  . Hypertension   . Diabetes mellitus   . Arthritis   . Blood transfusion   . Hx pulmonary embolism   . Anemia   . COPD (chronic obstructive pulmonary disease)   . Depression   . Anxiety   . Gastroesophageal reflux disease   . Osteoporosis   . DDD (degenerative disc disease)   . Uterine prolapse   . Asthma   . CHF (congestive heart failure) -- diastolic   . Emphysema of lung   . Heart murmur   . Hyperlipidemia   . Thyroid disease   . Shortness of breath   . Chronic kidney disease     lt kidney limited fxn  . Seizures     once  . Headache   . Dysrhythmia    Clinical Course: Length of Stay:  LOS: 2 days   Subjective:   Today [name] is feeling better.  Does not c/o SOB - but does have chest pressure with walking to bathroom.  Objective:  Temp:  [98.2 F (36.8 C)-98.7 F (37.1 C)] 98.7 F (37.1 C) (03/08 0400) Pulse Rate:  [62-76] 62  (03/08 0400) Resp:  [18-22] 20  (03/08 0400) BP: (89-126)/(52-76) 89/55 mmHg (03/08 0400) SpO2:  [92 %-95 %] 92 % (03/08 0400) Weight:  [79.2 kg (174 lb 9.7 oz)] 79.2 kg (174 lb 9.7 oz) (03/08 0400) Weight change: 4.357 kg (9 lb 9.7 oz) Physical Exam: General appearance: alert, cooperative, no distress, mildly obese and mentation seems clear. just that speech is difficult to understand, Neck: no adenopathy, no carotid bruit, no JVD, supple, symmetrical, trachea midline and thyroid not enlarged, symmetric, no tenderness/mass/nodules Lungs: clear to auscultation bilaterally, normal percussion bilaterally and with  minimal basal rales; non-labored Heart: regular rate and rhythm, S1, S2 normal, no murmur, click, rub or gallop Abdomen: soft, non-tender; bowel sounds normal; no masses,  no organomegaly and truncal obesity Extremities: extremities normal, atraumatic, no cyanosis or edema Pulses: 2+ and symmetric Neurologic: Cranial nerves: normal; non-focal.  Intake/Output from previous day: 03/07 0701 - 03/08 0700 In: 360 [P.O.:360] Out: -   Intake/Output Summary (Last 24 hours) at 08/16/11 0843 Last data filed at 08/15/11 1900  Gross per 24 hour  Intake    360 ml  Output      0 ml  Net    360 ml    Pertinent Lab Results: Troponin now <0.3, Bun/Cr 20/0.99, K 3.6; lipids reviewed - look good CBC: 5; 13/40.7 ; 292  Imaging:  Echo: Pending Cath: Planned today NST: Summer 2012 - non-ischemic, EF 83%  MAR Reviewed  Assessment/Plan:  Principal Problem:  *NSTEMI (non-ST elevated myocardial infarction) Active Problems:  DIABETES MELLITUS  HYPERLIPIDEMIA  Essential hypertension, benign  COPD  CHEST PAIN, PRECORDIAL  Diastolic CHF, acute  Anxiety and depression  ? NSTEMI vs CHF related myonecrosis as Troponin elevation was minimal.  2D Echo performed - needs to be read. Actually hypotensive overnight   Plan -- per Dr. Blanchie Dessert recommendations, with her RFs (DM on insulin, HLD, truncal obesity, ?  HTN)  & Sx, will proceed with LHC/Angiogrpahy +/- PCI with Abd Aortaogram (to evaluate renal arteries).  The procedure with Risks/Benefits/Alternatives and Indications was reviewed with the patient.  All questions were answered.    Risks / Complications include, but not limited to: Death, MI, CVA/TIA, VF/VT (with defibrillation), Bradycardia (need for temporary pacer placement), contrast induced nephropathy, bleeding / bruising / hematoma / pseudoaneurysm, vascular or coronary injury (with possible emergent CT or Vascular Surgery), adverse medication reactions, infection.    The patient voices  understanding and agree to proceed.   I have signed the consent form and placed it on the chart for patient signature and RN witness.     Time Spent Directly with Patient:  10 minutes   Danaysia Rader W, M.D., M.S. [facility] [address] [place], [place]  [address]  [phone]  08/16/2011 8:43 AM

## 2011-08-16 NOTE — CV Procedure (Signed)
[facility]     CARDIAC CATHETERIZATION REPORT  [name]   MRN: [id number] 15-Jul-1945    ADMIT DATE:  08/14/2011  Performing Cardiologist: Marykay Lex Primary Physician: Avon Gully, MD, MD Primary Cardiologist:  Susa Griffins, MD  Procedures Performed:  Left Heart Catheterization via 5  Fr Right Common Femoral Artery access  Left Ventriculography, (RAO) 10 ml/sec for 30 ml total contrast  Native Coronary Angiography  Abdominal Aortic Angiogram  Indication(s): Presentation with Chest Pain - mild troponin elevation  Probalby Diastolic HF exaccerbation  History: 66 y.o. female presented with dull aching pressure in chest, diaphoresis & palpitatations - near syncope / dizziness. Mild T wave changes on ECG with mildly + Troponin & BNP of ~2600.   Consent: The procedure with Risks/Benefits/Alternatives and Indications was reviewed with the patient.  All questions were answered.    Risks / Complications include, but not limited to: Death, MI, CVA/TIA, VF/VT (with defibrillation), Bradycardia (need for temporary pacer placement), contrast induced nephropathy, bleeding / bruising / hematoma / pseudoaneurysm, vascular or coronary injury (with possible emergent CT or Vascular Surgery), adverse medication reactions, infection.    The patient voice understanding and agree to proceed.    Consent for signed by MD and patient with RN witness -- placed on chart.  Procedure: The patient was brought to the 2nd Floor [facility] in the fasting state and prepped and draped in the usual sterile fashion for (Right groin or radial) access. Sterile technique was used including antiseptics, cap, gloves, gown, hand hygiene, mask and sheet.  Skin prep: Iodine solution;  Time Out: Verified patient identification, verified procedure, site/side was marked, verified correct patient position, special equipment/implants available,  medications/allergies/relevent history reviewed, required imaging and test results available.  Performed  After initial attempt to engage the Right Radial artery were unsuccessful, this approach was aborted in favor of femoral approach. The right femoral head was identified using tactile and fluoroscopic technique.  The right groin was anesthetized with 1% subcutaneous Lidocaine.  The right Common Femoral Artery was accessed using the Modified Seldinger Technique with placement of a antimicrobial bonded/coated single lumen (5 Fr) sheath.  The sheath was aspirated and flushed.    A 5 Fr JL4 Catheter was advanced of over a Standard J wire into the ascending Aorta.  The catheter was used to engage the Left Coronary Artery.  Multiple cineangiographic views of the Left Coronary Artery system(s) were performed - however, the LAD was not adequately visualized, therefore, the JL4 catheter was exchanged for a JL3.5 catheter that was able to engage the cloacal LM & perform adequate cineangiogaphy of the LAD. The JL3.5 catheter was exchanged for a JR4 Catheter that was used to engage the Right Coronary Artery.  Multiple cineangiographic views of the Right Coronary Artery system were performed. This catheter was then exchanged over the Standard J wire for an angled Pigtail catheter that was advanced across the Aortic Valve.  LV hemodynamics were measured and Left Ventriculography was performed.  LV hemodynamics were then re-sampled, and the catheter was pulled back across the Aortic Valve for measurement of "pull-back" gradient.  The catheter was pulled back to the abdominal regiona & an abdominal Aortic Angiogram was performed using 20ml of contrast for 1 sec.  The catheter and wire were removed completely out of the body.  The patient was transferred to the holding area where the sheath was removed with manual pressure held for hemostasis.    The patient  was transported to the PACU in hemodynamically stable, chest  pain free condition.   The patient  was stable before, during and following the procedure.   Patient did tolerate procedure well. There were not complications.  EBL: < 10 ml  Medications:  Premedication: 5 mg  Valium  Sedation:  none  Contrast:  150 ml Omnipaque  Hemodynamics:  Central Aortic Pressure / Mean Aortic Pressure: 91/55 mmHg, 72 mmHg  LV Pressure / LV End diastolic Pressure:  88/11 mmHg; 13 mmHg  Left Ventriculography:  EF:  55-60%  Wall Motion: None Abdominal Aorta:  Normal caliber (slight tapering infra-renal); widely patent Celiac (Hepatic & Splenic), SMA & bilateral Renal arteries.  Coronary Angiographic Data:  Left Main:  Cloacal - bifurcates immediately to LAD & LCx  Left Anterior Descending (LAD):  Small-moderate caliber vessel that tapers to a very small vessel distally after giving rise to major Diagonal branch (D1) and Septal Perforator (SP1).  No angiographically significant lesions noted - just small diffuse luminal irregularities distally.  1st diagonal (D1):  Small to moderate caliber, at least as large as LAD - no angiographically significant disease.  Circumflex (LCx):  Moderate caliber, non-dominant vessel that terminates as a major-Lateral OM Branch after giving off a small caliber bifurcating AV-Groove branch.  No angiographically significant disease.  Right Coronary Artery: Large caliber, dominant artery with anterior takeoff (short ascending aorta), gives rise to at least 2 RV marginal branches then bifurcates into a  posterior atrioventricular groove branch that terminates in an extensive RPL branch and a Moderate caliber RPDA.  posterior descending artery: moderate caliber, extensive distribution, minimal luminal irregularities  posterior lateral branch:  Moderate caliber, wrap around vessel that covers the anterior apical distribution normally covered by the distal LAD, also multiple septal branches; minimal luminal  irregularities  Impression:  No angiographically significant CAD with small tapering LAD & large RPDA  Normal / preserved EF of 55-60% with no obvious WMA & normal LVEDP  Normal Abdominal aorta with widely patent Celiac artery, bilateral renal arterys & SMA  No lesion to explain Troponin elevation - likely due to acute endocardial ischemia from elevated diastolic filling pressures.  Plan:  Monitor overnight for symptoms & stability  Anticipate d/c tomorrow as long as she is stable.  The case and results was discussed with the patient (and family). The case and results was not discussed with the patient's PCP. The case and results was discussed with the patient's Cardiologist.  Time Spend Directly with Patient:  45 minutes  [name], M.D., M.S. [facility] [address] [place], [place]  [address]  [phone]  08/16/2011 10:45 AM

## 2011-08-17 ENCOUNTER — Inpatient Hospital Stay (HOSPITAL_COMMUNITY): Payer: Medicaid Other

## 2011-08-17 ENCOUNTER — Encounter (HOSPITAL_COMMUNITY): Payer: Self-pay | Admitting: Radiology

## 2011-08-17 LAB — GLUCOSE, CAPILLARY
Glucose-Capillary: 110 mg/dL — ABNORMAL HIGH (ref 70–99)
Glucose-Capillary: 161 mg/dL — ABNORMAL HIGH (ref 70–99)
Glucose-Capillary: 137 mg/dL — ABNORMAL HIGH (ref 70–99)

## 2011-08-17 LAB — BASIC METABOLIC PANEL
BUN: 32 mg/dL — ABNORMAL HIGH (ref 6–23)
CO2: 34 mEq/L — ABNORMAL HIGH (ref 19–32)
Calcium: 9.7 mg/dL (ref 8.4–10.5)
Chloride: 97 mEq/L (ref 96–112)
Creatinine, Ser: 1.56 mg/dL — ABNORMAL HIGH (ref 0.50–1.10)
Glucose, Bld: 163 mg/dL — ABNORMAL HIGH (ref 70–99)

## 2011-08-17 MED ORDER — WARFARIN SODIUM 7.5 MG PO TABS
7.5000 mg | ORAL_TABLET | ORAL | Status: AC
  Administered 2011-08-17: 7.5 mg via ORAL
  Filled 2011-08-17: qty 1

## 2011-08-17 MED ORDER — FUROSEMIDE 40 MG PO TABS
40.0000 mg | ORAL_TABLET | Freq: Two times a day (BID) | ORAL | Status: DC
  Administered 2011-08-17 – 2011-08-18 (×2): 40 mg via ORAL
  Filled 2011-08-17 (×4): qty 1

## 2011-08-17 MED ORDER — IOHEXOL 350 MG/ML SOLN
80.0000 mL | Freq: Once | INTRAVENOUS | Status: AC | PRN
  Administered 2011-08-17: 80 mL via INTRAVENOUS

## 2011-08-17 MED ORDER — WARFARIN VIDEO
Freq: Once | Status: AC
  Administered 2011-08-17: 20:00:00

## 2011-08-17 MED ORDER — ENOXAPARIN SODIUM 80 MG/0.8ML ~~LOC~~ SOLN
80.0000 mg | SUBCUTANEOUS | Status: AC
  Administered 2011-08-17: 80 mg via SUBCUTANEOUS
  Filled 2011-08-17: qty 0.8

## 2011-08-17 MED ORDER — ISOSORBIDE MONONITRATE ER 30 MG PO TB24: 30.0000 mg | ORAL_TABLET | Freq: Every day | ORAL | Status: AC

## 2011-08-17 MED ORDER — WARFARIN - PHARMACIST DOSING INPATIENT
Freq: Every day | Status: DC
  Administered 2011-08-19 – 2011-08-20 (×2)

## 2011-08-17 MED ORDER — ENOXAPARIN SODIUM 80 MG/0.8ML ~~LOC~~ SOLN
80.0000 mg | Freq: Two times a day (BID) | SUBCUTANEOUS | Status: DC
  Administered 2011-08-18 – 2011-08-21 (×7): 80 mg via SUBCUTANEOUS
  Filled 2011-08-17 (×12): qty 0.8

## 2011-08-17 MED ORDER — COUMADIN BOOK
Freq: Once | Status: AC
  Administered 2011-08-17: 20:00:00
  Filled 2011-08-17: qty 1

## 2011-08-17 NOTE — Progress Notes (Signed)
Called by Radiology, Pt. Has massive pu. Embolus.    REPORT:IMPRESSION:  1. Massive bilateral pulmonary embolism, as detailed above. In  addition, there is evidence to suggest elevated pulmonary artery  and right-sided heart pressures. Findings were immediately  conveyed by phone to PA Nada Boozer by Dr. Llana Aliment on 08/17/2011  at 06:37 p.m.  2. Small amount of pericardial fluid and/or thickening, unlikely  to be of any hemodynamic significance.  3. 7 mm noncalcified nodule in the right posterior costophrenic  sulcus is unchanged compared to 07/26/2010. Given its stability in  size over a 1-year time period, this is strongly favored to be a  benign nodule, however, a repeat CT scan in 1 year is recommended  to establish greater than 2 years of stability.  4. Sequelae of old granulomatous disease, as above.   Discussed the elevated pul artery and rt. Sided pressures with Dr. Herbie Baltimore,  The 2D echo done 08/15/11 does reveal mod amount of Pulmonary hypertension.  Will begin full dose Lovenox and coumadin per pharmacy.

## 2011-08-17 NOTE — Progress Notes (Signed)
ANTICOAGULATION CONSULT NOTE - Initial Consul Pharmacy Consult for Lovenox/Coumadin Indication: Acute PE  Allergies  Allergen Reactions  . Procaine Hcl Anaphylaxis  . Codeine     REACTION: Reaction not known  . Latex   . Penicillins Rash    Patient Measurements: Height: 5\' 2"  (157.5 cm) Weight: 168 lb 6.9 oz (76.4 kg) IBW/kg (Calculated) : 50.1  Heparin Dosing Weight:    Vital Signs: Temp: 98.6 F (37 C) (03/09 1400) Temp src: Oral (03/09 1400) BP: 97/63 mmHg (03/09 1400) Pulse Rate: 72  (03/09 1400)  Labs:  Basename 08/17/11 1609 08/15/11 1231 08/15/11 1230 08/15/11 0843 08/15/11 0645 08/15/11 0209 08/15/11 0208  HGB -- -- 13.0 -- -- 12.3 --  HCT -- -- 40.7 -- -- 38.1 --  PLT -- -- 292 -- -- 272 --  APTT -- -- -- -- -- -- --  LABPROT -- -- 14.6 -- -- -- --  INR -- -- 1.12 -- -- -- --  HEPARINUNFRC -- -- -- -- -- -- --  CREATININE 1.56* -- 0.99 -- 1.14* -- --  CKTOTAL -- 54 -- 53 -- -- 58  CKMB -- 2.1 -- 2.2 -- -- 2.1  TROPONINI -- <0.30 -- <0.30 -- -- <0.30   Estimated Creatinine Clearance: 35.3 ml/min (by C-G formula based on Cr of 1.56).  Medical History: Past Medical History  Diagnosis Date  . Hypertension   . Diabetes mellitus   . Arthritis   . Blood transfusion   . Hx pulmonary embolism   . Anemia   . COPD (chronic obstructive pulmonary disease)   . Depression   . Anxiety   . Gastroesophageal reflux disease   . Osteoporosis   . DDD (degenerative disc disease)   . Uterine prolapse   . Uterine prolapse   . Enlarged heart   . Asthma   . CHF (congestive heart failure)   . Emphysema of lung   . Heart murmur   . Hyperlipidemia   . Thyroid disease   . Shortness of breath   . Chronic kidney disease     lt kidney limited fxn  . Seizures     once  . Headache   . Dysrhythmia     Medications:  Prescriptions prior to admission  Medication Sig Dispense Refill  . albuterol (PROVENTIL HFA;VENTOLIN HFA) 108 (90 BASE) MCG/ACT inhaler Inhale 2 puffs  into the lungs every 6 (six) hours as needed.      Marland Kitchen amitriptyline (ELAVIL) 25 MG tablet Take 25 mg by mouth at bedtime.        Marland Kitchen aspirin 81 MG chewable tablet Chew 81 mg by mouth every morning.      . Choline Fenofibrate (TRILIPIX) 135 MG capsule Take 135 mg by mouth every morning.       . diazepam (VALIUM) 10 MG tablet Take 10 mg by mouth 3 (three) times daily.       Marland Kitchen escitalopram (LEXAPRO) 20 MG tablet Take 20 mg by mouth every morning.       . fish oil-omega-3 fatty acids 1000 MG capsule Take 1 g by mouth every morning.       . furosemide (LASIX) 40 MG tablet Take 20 mg by mouth daily.       Marland Kitchen gabapentin (NEURONTIN) 300 MG capsule Take 300 mg by mouth at bedtime and may repeat dose one time if needed.      Marland Kitchen glimepiride (AMARYL) 1 MG tablet Take 2 tablets (2 mg total) by mouth daily before  breakfast.  30 tablet  0  . ipratropium-albuterol (DUONEB) 0.5-2.5 (3) MG/3ML SOLN Take 3 mLs by nebulization every 4 (four) hours as needed. For shortness of breath       . levothyroxine (SYNTHROID, LEVOTHROID) 75 MCG tablet Take 75 mcg by mouth every morning.       . linagliptin (TRADJENTA) 5 MG TABS tablet Take 5 mg by mouth daily.      . metoprolol succinate (TOPROL-XL) 25 MG 24 hr tablet Take 25 mg by mouth every morning.       . Misc Natural Products (GREEN TEA SLIM) TABS Take 1 tablet by mouth daily.      . Multiple Vitamin (MULITIVITAMIN WITH MINERALS) TABS Take 1 tablet by mouth every morning.      Marland Kitchen OVER THE COUNTER MEDICATION Take 1 capsule by mouth 3 (three) times daily. Vinegar capsule      . potassium chloride SA (K-DUR,KLOR-CON) 20 MEQ tablet Take 10 mEq by mouth daily.       . simvastatin (ZOCOR) 40 MG tablet Take 40 mg by mouth at bedtime.       . traMADol (ULTRAM) 50 MG tablet Take 50 mg by mouth 3 (three) times daily. For pain         Assessment: Pt. Presented to ED 3/6 c/o SOB, CP and elevated troponin thought to be CHF/NSTEMI.  Heart cath was clean with EF 55-60%. Patient has  continued to have SOB and desaturations when walking. CT = Massive bilateral pulmonary embolism.There is evidence to suggest elevated pulmonary artery and right-sided heart pressures.  Scr 1.56 with know renal insufficiency. (CrCl around 35). Last CBC ok. Start full dose anticoagulation. Lovenox to Coumadin bridge x at least 5 days until INR>2.   Goal of Therapy:  Therapeutic anticoagulation with LMWH INR 2-3   Plan:  Lovenox 80mg  sq q12. Coumadin 7.5mg  po x 1 tonight Daily PT/INR Initiate education with book/video.  Misty Stanley Stillinger 08/17/2011,7:05 PM

## 2011-08-17 NOTE — Progress Notes (Signed)
Thankfully, her O2 Desaturation with ambulation prior to discharge triggered [place] ordering a CTA Chest that has demonstrated a significant PE with elevated PA pressures.  2D echo did reveal moderate PH - which correlates well.  This is a bad prognostic indicator in a pt with known COPD & prior H/o PEs.  We have initiated anticoagulation with Lovenox & warfarin. Thankfully, she was not discharged.  Marykay Lex, M.D., M.S. [facility] [address] [place], [place]  [address]  [phone]  08/17/2011 11:53 PM

## 2011-08-17 NOTE — Progress Notes (Signed)
Subjective:  no chest pain  Objective: Vital signs in last 24 hours: Temp:  [98 F (36.7 C)-98.7 F (37.1 C)] 98.5 F (36.9 C) (03/09 0600) Pulse Rate:  [65-84] 84  (03/09 0600) Resp:  [18-22] 18  (03/09 0600) BP: (101-113)/(56-70) 109/64 mmHg (03/09 0600) SpO2:  [87 %-94 %] 94 % (03/09 1610) Weight:  [76.4 kg (168 lb 6.9 oz)] 76.4 kg (168 lb 6.9 oz) (03/09 0600) Weight change: -2.8 kg (-6 lb 2.8 oz) Last BM Date: 08/15/11 Intake/Output from previous day: +240   Intake/Output this shift: Total I/O In: 240 [P.O.:240] Out: -   PE: General appearance: alert, cooperative, no distress and much more alert & talkative today Neck: no adenopathy, no carotid bruit, no JVD, supple, symmetrical, trachea midline and thyroid not enlarged, symmetric, no tenderness/mass/nodules Lungs: rhonchi bibasilar, non-labored. Heart: regular rate and rhythm, S1, S2 normal, no murmur, click, rub or gallop Abdomen: soft, non-tender; bowel sounds normal; no masses,  no organomegaly Extremities: extremities normal, atraumatic, no cyanosis or edema Pulses: 2+ and symmetric -- radial cath site (attempted) with mild bruising. Neurologic: Grossly normal   Lab Results:  Basename 08/15/11 1230 08/15/11 0209  WBC 5.0 5.4  HGB 13.0 12.3  HCT 40.7 38.1  PLT 292 272   BMET  Basename 08/15/11 1230 08/15/11 0645  NA 140 143  K 3.6 4.0  CL 101 105  CO2 30 31  GLUCOSE 126* 133*  BUN 20 20  CREATININE 0.99 1.14*  CALCIUM 9.7 9.4    Basename 08/15/11 1231 08/15/11 0843  TROPONINI <0.30 <0.30    Lab Results  Component Value Date   CHOL 109 08/15/2011   HDL 43 08/15/2011   LDLCALC 48 08/15/2011   TRIG 88 08/15/2011   CHOLHDL 2.5 08/15/2011   Lab Results  Component Value Date   HGBA1C 6.3* 08/15/2011     Lab Results  Component Value Date   TSH 2.432 07/28/2010    Hepatic Function Panel No results found for this basename: PROT,ALBUMIN,AST,ALT,ALKPHOS,BILITOT,BILIDIR,IBILI in the last 72 hours  Basename  08/15/11 0645  CHOL 109   No results found for this basename: PROTIME in the last 72 hours  Studies/Results: 2D ECHO:  Study Conclusions  - Left ventricle: The cavity size was normal. Wall thickness was increased in a pattern of mild LVH. There was mild concentric hypertrophy. Systolic function was normal. The estimated ejection fraction was in the range of 55% to 60%. Wall motion was normal; there were no regional wall motion abnormalities. Doppler parameters are consistent with abnormal left ventricular relaxation (grade 1 diastolic dysfunction). - Ventricular septum: The contour showed mild leftward deviation and systolic flattening. - Aortic valve: Mild regurgitation. - Right ventricle: The cavity size was mildly dilated.  RV Systolic pressure was increased. - Tricuspid valve: Moderate regurgitation. - Pulmonary arteries: PA peak pressure: 45mm Hg (S). - Pericardium, extracardiac: A trivial pericardial effusion was identified posterior to the heart. Impressions: - The right ventricular systolic pressure was increased consistent with moderate pulmonary hypertension.  Cardiac cath:  Impression:  No angiographically significant CAD with small tapering LAD & large RPDA  Normal / preserved EF of 55-60% with no obvious WMA & normal LVEDP  Normal Abdominal aorta with widely patent Celiac artery, bilateral renal arterys & SMA  No lesion to explain Troponin elevation - likely due to acute endocardial ischemia from elevated diastolic filling pressures.  Medications: I have reviewed the patient's current medications. Scheduled Meds:   . amitriptyline  25 mg Oral QHS  .  aspirin  81 mg Oral Q24H  . clopidogrel  75 mg Oral Q breakfast  . enoxaparin  40 mg Subcutaneous Q24H  . escitalopram  20 mg Oral Q24H  . fenofibrate  160 mg Oral QHS  . furosemide  40 mg Intravenous BID  . gabapentin  300 mg Oral QHS,MR X 1  . glimepiride  2 mg Oral QAC breakfast  . insulin aspart  0-5 Units  Subcutaneous QHS  . insulin aspart  0-9 Units Subcutaneous TID WC  . isosorbide dinitrate  10 mg Oral TID  . levothyroxine  75 mcg Oral Q24H  . metoprolol succinate  25 mg Oral Q24H  . omega-3 acid ethyl esters  1 g Oral Daily  . potassium chloride SA  10 mEq Oral Daily  . simvastatin  40 mg Oral q1800  . traMADol  50 mg Oral TID  . DISCONTD: sodium chloride  3 mL Intravenous Q12H   Continuous Infusions:   . sodium chloride 1 mL/kg/hr (08/16/11 1212)  . DISCONTD: sodium chloride     PRN Meds:.acetaminophen, albuterol, diazepam, ondansetron (ZOFRAN) IV, DISCONTD: sodium chloride  Assessment/Plan: Patient Active Problem List  Diagnoses  . DIABETES MELLITUS  . HYPERLIPIDEMIA  . ANEMIA  . Essential hypertension, benign  . PE  . COPD  . CHEST PAIN, PRECORDIAL  . Abnormal chest x-ray  . Palpitations  . COPD exacerbation  . Unstable angina, negative MI   . Diastolic CHF, acute  . Anxiety and depression   Ambulated in hall with SaO2% dropping to 86-86% with mild subjective SOB  PLAN: Maybe microvascular disease, will add nitrate.  OK for d/c today once we have ensured no PE. Will Change Lasix to po.  She is scheduled for renal artery dopplers March 26. Then follow up with Dr. Alanda Amass  LOS: 3 days   INGOLD,LAURA R 08/17/2011, 2:03 PM  I have seen and examined the patient along with Nada Boozer, NP.  I have reviewed the chart, notes and new data.  I agree with Laura's note.  Brief Description: 66 y/o woman with COPD, prior PEs, HTN, DM admitted with SSx c/w unstable angina vs. Acute diastolic HF.  No further chest discomfort,but remains SOB @ baseline.  Cath yesterday - no obstructive epicardial coronary artery disease, normal EF & normal LVEDP (albeit after diuresis).  Key new complaints: Still a bit SOB, no further CP Key examination changes: still with rhonchi / rales @ bases Key new findings / data: Echo reviewed (I performed cath); desat with ambulation  PLAN: Still  concerned with mildly increased troponin & desaturation with walking --> will check CTA chest to r/o acure PE (since she dose have Hx of PE). Most likely, this is due to her baseline COPD & needs to use her home O2 with ambulation / activity.  She is stable from a cardiac standpoint - BP well controlled. Can d/c Plavix based upon cath results. No need for renal dopplers as cath demonstrated no obstructive Renal artery Dz.    Provided no PE, can d/c to home today with f/u as planned above.  Marykay Lex, M.D., M.S. [facility] [address] [place], [place]  [address]  [phone]  08/17/2011 3:09 PM

## 2011-08-18 LAB — PROTIME-INR: INR: 1.18 (ref 0.00–1.49)

## 2011-08-18 LAB — CBC
HCT: 38.1 % (ref 36.0–46.0)
Hemoglobin: 12.3 g/dL (ref 12.0–15.0)
MCV: 90.9 fL (ref 78.0–100.0)
RBC: 4.19 MIL/uL (ref 3.87–5.11)
RDW: 15.5 % (ref 11.5–15.5)
WBC: 5.3 10*3/uL (ref 4.0–10.5)

## 2011-08-18 LAB — BASIC METABOLIC PANEL
CO2: 31 mEq/L (ref 19–32)
Chloride: 98 mEq/L (ref 96–112)
GFR calc Af Amer: 43 mL/min — ABNORMAL LOW (ref >90)
Potassium: 3.4 mEq/L — ABNORMAL LOW (ref 3.5–5.1)
Sodium: 140 mEq/L (ref 135–145)

## 2011-08-18 LAB — GLUCOSE, CAPILLARY: Glucose-Capillary: 114 mg/dL — ABNORMAL HIGH (ref 70–99)

## 2011-08-18 MED ORDER — FUROSEMIDE 20 MG PO TABS
20.0000 mg | ORAL_TABLET | Freq: Every day | ORAL | Status: DC
  Administered 2011-08-19: 20 mg via ORAL
  Filled 2011-08-18: qty 1

## 2011-08-18 MED ORDER — PANTOPRAZOLE SODIUM 40 MG PO TBEC
40.0000 mg | DELAYED_RELEASE_TABLET | Freq: Every day | ORAL | Status: DC
  Administered 2011-08-18 – 2011-08-21 (×4): 40 mg via ORAL
  Filled 2011-08-18 (×3): qty 1

## 2011-08-18 MED ORDER — POTASSIUM CHLORIDE CRYS ER 20 MEQ PO TBCR
40.0000 meq | EXTENDED_RELEASE_TABLET | Freq: Once | ORAL | Status: AC
  Administered 2011-08-18: 40 meq via ORAL
  Filled 2011-08-18: qty 2

## 2011-08-18 MED ORDER — WARFARIN SODIUM 7.5 MG PO TABS
7.5000 mg | ORAL_TABLET | Freq: Once | ORAL | Status: AC
  Administered 2011-08-18: 7.5 mg via ORAL
  Filled 2011-08-18: qty 1

## 2011-08-18 NOTE — Progress Notes (Signed)
Upon entering room, patient has slight nose bleed. Scant amount of blood seen on tissue. Patient very upset. Reassurance given. Will monitor and contact physician if nose bleed worsens.

## 2011-08-18 NOTE — Progress Notes (Signed)
Brief Description: 66 y/o woman with COPD, prior PEs, HTN, DM admitted with SSx c/w unstable angina vs. Acute diastolic HF. No further chest discomfort,but remains SOB @ baseline. Cath yesterday - no obstructive epicardial coronary artery disease, normal EF & normal LVEDP (albeit after diuresis). CT angio of chest  Massive bilateral pulmonary embolism, as detailed above. In  addition, there is evidence to suggest elevated pulmonary artery  and right-sided heart pressures  2D echo did reveal moderate PH - which correlates well. This is a bad prognostic indicator in a pt with known COPD & prior H/o PEs.   Subjective: She is better today but still having shortness of breath.  Objective: Vital signs in last 24 hours: Temp:  [98.1 F (36.7 C)-98.8 F (37.1 C)] 98.8 F (37.1 C) (03/10 1200) Pulse Rate:  [65-73] 66  (03/10 1200) Resp:  [18-20] 20  (03/10 1200) BP: (96-108)/(65-76) 96/74 mmHg (03/10 1200) SpO2:  [90 %-94 %] 93 % (03/10 1200) Weight:  [78.6 kg (173 lb 4.5 oz)] 78.6 kg (173 lb 4.5 oz) (03/10 0500) Weight change: 2.2 kg (4 lb 13.6 oz) Last BM Date: 08/17/11 Intake/Output from previous day: 03/09 0701 - 03/10 0700 In: 720 [P.O.:720] Out: 850 [Urine:850] Intake/Output this shift: Total I/O In: 240 [P.O.:240] Out: 500 [Urine:500]  PE: General appearance: alert, cooperative, appears older than stated age, no distress, moderately obese and Mentation appear to improve from Friday was still is low back and this is her baseline. Slow difficult to understand speech Neck: no adenopathy, no carotid bruit, no JVD, supple, symmetrical, trachea midline and thyroid not enlarged, symmetric, no tenderness/mass/nodules Lungs: rhonchi Diffuse / course with occasional expiratory wheeze and Nonlabored Heart: regular rate and rhythm, S1, S2 normal, no murmur, click, rub or gallop Abdomen: soft, non-tender; bowel sounds normal; no masses,  no organomegaly and Obese Extremities: extremities  normal, atraumatic, no cyanosis or edema Pulses: 2+ and symmetric Neurologic: Mental status: Alert, oriented, thought content appropriate, Slow slurred speech but makes much worse at today   Lab Results:  Basename 08/18/11 1015  WBC 5.3  HGB 12.3  HCT 38.1  PLT 272   BMET  Basename 08/18/11 1015 08/17/11 1609  NA 140 140  K 3.4* 3.7  CL 98 97  CO2 31 34*  GLUCOSE 206* 163*  BUN 37* 32*  CREATININE 1.45* 1.56*  CALCIUM 9.7 9.7    Lab Results  Component Value Date   HGBA1C 6.3* 08/15/2011     Lab Results  Component Value Date   TSH 2.432 07/28/2010    Hepati Studies/Results: Ct Angio Chest W/cm &/or Wo Cm  08/17/2011  *RADIOLOGY REPORT*  Clinical Data: This of breath.  Oxygen desaturation.  CT ANGIOGRAPHY CHEST  Technique:  Multidetector CT imaging of the chest using the standard protocol during bolus administration of intravenous contrast. Multiplanar reconstructed images including MIPs were obtained and reviewed to evaluate the vascular anatomy.  Contrast: 80mL OMNIPAQUE IOHEXOL 350 MG/ML IV SOLN  Comparison: Chest CT 07/26/2010.  Findings:  Mediastinum: There are numerous large filling defects throughout the pulmonary arterial tree involving the distal main pulmonary artery, extending into lobar, segmental and subsegmental sized branches throughout the lungs bilaterally.  The pulmonic trunk is slightly dilated (3.3 cm in diameter), suggesting elevated pulmonary artery pressures.  As well, there is mild dilatation of the right ventricle and right atrium, with slight straightening of the interventricular septum, which could suggest elevated right heart pressures.  Overall heart size is upper limits of normal. There is  a trace amount of pericardial fluid and/or thickening, unlikely to be of any hemodynamic significance.  No associated pericardial calcification.  No pathologically enlarged mediastinal or hilar lymph nodes.  The esophagus is unremarkable in appearance.  Lungs/Pleura:  Small amount of scarring in the inferior segment of the lingula is unchanged compared to the prior examination.  Large calcified granuloma in the lateral segment of the right middle lobe.  No consolidative airspace disease.  No pleural effusions.  7 mm nodule in the right lower lobe in the posterior costophrenic sulcus, unchanged compared to prior study 07/26/2010.  No other larger more suspicious appearing pulmonary nodules or masses are otherwise identified.  Minimal dependent atelectasis in the left lower lobe.  Upper Abdomen: Calcified granulomas in the liver and spleen. Otherwise, unremarkable.  Musculoskeletal: There are no aggressive appearing lytic or blastic lesions noted in the visualized portions of the skeleton.  IMPRESSION: 1.  Massive bilateral pulmonary embolism, as detailed above.  In addition, there is evidence to suggest elevated pulmonary artery and right-sided heart pressures.  Findings were immediately conveyed by phone to PA Nada Boozer by Dr. Llana Aliment on 08/17/2011 at 06:37 p.m. 2.  Small amount of pericardial fluid and/or thickening, unlikely to be of any hemodynamic significance.  3.  7 mm noncalcified nodule in the right posterior costophrenic sulcus is unchanged compared to 07/26/2010.  Given its stability in size over a 1-year time period, this is strongly favored to be a benign nodule, however, a repeat CT scan in 1 year is recommended to establish greater than 2 years of stability. 4.  Sequelae of old granulomatous disease, as above.  Original Report Authenticated By: Florencia Reasons, M.D.    Medications: I have reviewed the patient's current medications.    Marland Kitchen amitriptyline  25 mg Oral QHS  . aspirin  81 mg Oral Q24H  . clopidogrel  75 mg Oral Q breakfast  . coumadin book   Does not apply Once  . enoxaparin (LOVENOX) injection  80 mg Subcutaneous STAT  . enoxaparin (LOVENOX) injection  80 mg Subcutaneous Q12H  . escitalopram  20 mg Oral Q24H  . fenofibrate  160 mg Oral  QHS  . furosemide  40 mg Oral BID  . gabapentin  300 mg Oral QHS,MR X 1  . glimepiride  2 mg Oral QAC breakfast  . insulin aspart  0-5 Units Subcutaneous QHS  . insulin aspart  0-9 Units Subcutaneous TID WC  . isosorbide dinitrate  10 mg Oral TID  . levothyroxine  75 mcg Oral Q24H  . metoprolol succinate  25 mg Oral Q24H  . omega-3 acid ethyl esters  1 g Oral Daily  . potassium chloride SA  10 mEq Oral Daily  . potassium chloride  40 mEq Oral Once  . simvastatin  40 mg Oral q1800  . traMADol  50 mg Oral TID  . warfarin  7.5 mg Oral STAT  . warfarin  7.5 mg Oral ONCE-1800  . warfarin   Does not apply Once  . Warfarin - Pharmacist Dosing Inpatient   Does not apply q1800  . DISCONTD: enoxaparin  40 mg Subcutaneous Q24H  . DISCONTD: furosemide  40 mg Intravenous BID   Assessment/Plan: Patient Active Problem List  Diagnoses  . DIABETES MELLITUS  . HYPERLIPIDEMIA  . ANEMIA  . Essential hypertension, benign  . PE. history of PE in 2007, NOW with NEW Massive P.E.- most likely cause of troponin leak.08/14/11   . COPD  . CHEST PAIN, PRECORDIAL  .  Abnormal chest x-ray  . Palpitations  . COPD exacerbation  . Unstable angina, negative MI Cardiac cath with no angiographically significant CAD. Most likely all related to pul. embolus.  . Diastolic CHF, acute, resolved at discharge  . Anxiety and depression   PLAN: MD to discuss possibility of death from PE with out treatment.  Not sure pt. Is aware of    Severity of PE. Also not sure if she would be able to give herself Lovenox injections at home.  Renal insuffiencey --rechecking BMP today, yesterday pm Cr. was increased to 1.56 - now 1.45   LOS: 4 days   Davyd Podgorski W 08/18/2011, 2:42 PM  I have seen and examined the patient along with Nada Boozer, NP.  I have reviewed the chart, notes and new data.  I agree with Laura's note. The examination above was performed by me.    Brief Description: 66 year old woman presented with dull  aching chest pressure and shortness of breath at baseline COPD on home oxygen at night. She had palpitations diaphoresis and near syncope. Due to mild T-wave changes and mild troponin elevation with a BNP of 2600, she was referred for cardiac catheterization which showed no circumflex coronary disease in a right dominant system. Yesterday afternoon when granulating in the hall she was noted to have oxygen desaturations to the mid 80s. This is concerning a patient with history of PEs in 2007 therefore she was referred for CT angiography of the chest which demonstrated bilateral pulmonary emboli. This is most likely expiration for elevated troponin BMP and symptoms. She was started on Lovenox last night with warfarin. She continues to be on oxygen.  Key new complaints: Stage breath but no more chest discomfort. Has not walked around today  Key examination changes: No severe change the exam she still is a coarse breath sounds throughout on ounces baseline with sheer size of the abnormal MRI would wonder she had some potential pulmonary infarct.  Key new findings / data: CTA results as noted above. Creatinine is improved.   PLAN:  Lovenox with bridging to therapeutic warfarin dose. I do see or feel a little bit less dyspneic prior to discharging her. As likely be several days before she is ready for discharge.  From cardiac standpoint her blood pressure is actually below today, I am thinking we can probably back off on the diuresis and not start an ACE inhibitor. She is on low-dose blocker and nitrate. I will discontinue her culprit relishes now on Coumadin.   She's on fibrate for her lipids as well as negative fatty acids he'll continue that for 6 as well as statin.  She may very well need standing nebulizer but she is currently on when necessary albuterol.  Had a long talk with the patient and her husband as to the absolute importance of her being on Coumadin. Her family is going back poisoning and  they're concerned about this. His nurse as well as Ms. Ingold spent time talking with her about the importance of it.  I be indurated his importance I explained to her that this is a medication that until there is another option she will likely beyond the remainder of her life. This is her second episode of bilateral PEs. It would be nice to know she is a underlying hypercoagulable state. However given the size of her emboli and had been recently on heparin and did not think it taking hypercoagulable profile was appropriate this time. I explained to her that is getting a  little while ago orthopedic but she's any medication for the rest of her life they seemed to be understanding I there may be in the future other options such as Xarelto been one potential option. However, this will likely be a less available option due to expenses.  Concern that she potentially has distal foot, phlebitis or some other sort of nidus for venous thromboemboli. I will check lower extremity venous Dopplers in the morning.  Renal function is improved on the back on her Lasix.  I will start PPI for GI prophylaxis.  Marykay Lex, M.D., M.S. [facility] [address] [place], [place]  [address]  [phone]  08/18/2011 2:45 PM

## 2011-08-18 NOTE — Progress Notes (Signed)
ANTICOAGULATION CONSULT NOTE - Follow Up Consult  Pharmacy Consult for Lovenox and Coumadin Indication: pulmonary embolus  Allergies  Allergen Reactions  . Procaine Hcl Anaphylaxis  . Codeine     REACTION: Reaction not known  . Latex   . Penicillins Rash    Patient Measurements: Height: 5\' 2"  (157.5 cm) Weight: 173 lb 4.5 oz (78.6 kg) IBW/kg (Calculated) : 50.1  Heparin Dosing Weight:   Vital Signs: Temp: 98.1 F (36.7 C) (03/10 0800) Temp src: Oral (03/10 0800) BP: 108/76 mmHg (03/10 0800) Pulse Rate: 73  (03/10 0800)  Labs:  Basename 08/18/11 0500 08/17/11 1609 08/15/11 1231 08/15/11 1230  HGB -- -- -- 13.0  HCT -- -- -- 40.7  PLT -- -- -- 292  APTT -- -- -- --  LABPROT 15.2 -- -- 14.6  INR 1.18 -- -- 1.12  HEPARINUNFRC -- -- -- --  CREATININE -- 1.56* -- 0.99  CKTOTAL -- -- 54 --  CKMB -- -- 2.1 --  TROPONINI -- -- <0.30 --   Estimated Creatinine Clearance: 35.8 ml/min (by C-G formula based on Cr of 1.56).  Assessment: 65yof on Lovenox bridging to Coumadin (Day 2 of minimum 5) for CTA confirmed bilateral PE. INR (1.18) is subtherapeutic as expected s/p Coumadin x 1 dose.  - Wt: 79kg, CrCl 36 ml/min - H/H and Plts wnl - No significant bleeding reported  Goal of Therapy:  INR 2-3 Anti-Xa level: 0.6-1.2  Plan:  1. Repeat Coumadin 7.5mg  po x 1 2. Continue Lovenox 80mg  SQ q12h 3. Follow-up AM INR and SCr - adjust doses as indicated  Cleon Dew [phone] 08/18/2011,10:35 AM

## 2011-08-19 DIAGNOSIS — I2699 Other pulmonary embolism without acute cor pulmonale: Secondary | ICD-10-CM

## 2011-08-19 LAB — CBC
HCT: 39 % (ref 36.0–46.0)
MCHC: 32.1 g/dL (ref 30.0–36.0)
Platelets: 246 10*3/uL (ref 150–400)
RDW: 15.2 % (ref 11.5–15.5)
WBC: 4.3 10*3/uL (ref 4.0–10.5)

## 2011-08-19 LAB — GLUCOSE, CAPILLARY
Glucose-Capillary: 145 mg/dL — ABNORMAL HIGH (ref 70–99)
Glucose-Capillary: 79 mg/dL (ref 70–99)
Glucose-Capillary: 147 mg/dL — ABNORMAL HIGH (ref 70–99)

## 2011-08-19 LAB — PROTIME-INR: INR: 1.48 (ref 0.00–1.49)

## 2011-08-19 MED ORDER — MAGNESIUM HYDROXIDE 400 MG/5ML PO SUSP: 30.0000 mL | Freq: Every day | ORAL | Status: DC | PRN

## 2011-08-19 MED ORDER — DIAZEPAM 5 MG PO TABS
10.0000 mg | ORAL_TABLET | Freq: Two times a day (BID) | ORAL | Status: DC | PRN
Start: 2011-08-19 — End: 2011-08-21

## 2011-08-19 MED ORDER — WARFARIN SODIUM 5 MG PO TABS
5.0000 mg | ORAL_TABLET | Freq: Once | ORAL | Status: AC
  Administered 2011-08-19: 5 mg via ORAL
  Filled 2011-08-19: qty 1

## 2011-08-19 MED ORDER — TRAMADOL HCL 50 MG PO TABS: 50.0000 mg | ORAL_TABLET | Freq: Two times a day (BID) | ORAL | Status: DC | PRN

## 2011-08-19 MED ORDER — ALUM & MAG HYDROXIDE-SIMETH 200-200-20 MG/5ML PO SUSP
30.0000 mL | Freq: Four times a day (QID) | ORAL | Status: DC | PRN
  Administered 2011-08-19: 30 mL via ORAL
  Filled 2011-08-19: qty 30

## 2011-08-19 NOTE — Progress Notes (Signed)
ANTICOAGULATION CONSULT NOTE - Follow Up Consult  Pharmacy Consult for Lovenox and Coumadin Indication: pulmonary embolus  Allergies  Allergen Reactions  . Procaine Hcl Anaphylaxis  . Codeine     REACTION: Reaction not known  . Latex   . Penicillins Rash    Patient Measurements: Height: 5\' 2"  (157.5 cm) Weight: 177 lb 4 oz (80.4 kg) IBW/kg (Calculated) : 50.1  Heparin Dosing Weight:   Vital Signs: Temp: 97.5 F (36.4 C) (03/11 0500) BP: 112/60 mmHg (03/11 0620) Pulse Rate: 64  (03/11 0620)  Labs:  Basename 08/19/11 0625 08/18/11 1015 08/18/11 0500 08/17/11 1609  HGB 12.5 12.3 -- --  HCT 39.0 38.1 -- --  PLT 246 272 -- --  APTT -- -- -- --  LABPROT 18.2* -- 15.2 --  INR 1.48 -- 1.18 --  HEPARINUNFRC -- -- -- --  CREATININE -- 1.45* -- 1.56*  CKTOTAL -- -- -- --  CKMB -- -- -- --  TROPONINI -- -- -- --   Estimated Creatinine Clearance: 39 ml/min (by C-G formula based on Cr of 1.45).  Assessment: 65yof on Lovenox bridging to Coumadin (Day 3 of minimum 5) for CTA confirmed bilateral PE. INR up to 1.48 today - good response to 2 doses of Coumadin 7.5mg . - Wt: 80.4kg, CrCl 39 ml/min - H/H and Plts stable - Experiencing a slight nosebleed this AM.  This may be due to INR rise or Lovenox accumulation with borderline renal function.  Goal of Therapy:  INR 2-3 Anti-Xa level: 0.6-1.2  Plan:  1. Decrease Coumadin to 5mg  today 2. Continue Lovenox 80mg  SQ q12h.  She is not at a steady state with her Lovenox so an antiXa level at this point would be difficult to interpret. 3. Continue to monitor for bleeding complications. 4. Follow-up AM INR and SCr - adjust doses as indicated  Estella Husk, Pharm.D., BCPS Clinical Pharmacist  Pager [phone] 08/19/2011, 9:21 AM

## 2011-08-19 NOTE — Progress Notes (Signed)
[facility]  Subjective: No Complaints.  Breathing Ok.  Eating Vienna sausage when I entered.   Objective: Vital signs in last 24 hours: Temp:  [97.5 F (36.4 C)-98.5 F (36.9 C)] 97.8 F (36.6 C) (03/11 1200) Pulse Rate:  [60-73] 73  (03/11 1200) Resp:  [18] 18  (03/11 1200) BP: (88-112)/(54-74) 96/63 mmHg (03/11 1259) SpO2:  [90 %-94 %] 94 % (03/11 1200) Weight:  [80.4 kg (177 lb 4 oz)] 80.4 kg (177 lb 4 oz) (03/11 0500) Last BM Date: 08/17/11  Intake/Output from previous day: 03/10 0701 - 03/11 0700 In: 720 [P.O.:720] Out: 2100 [Urine:2100] Intake/Output this shift: Total I/O In: 240 [P.O.:240] Out: -   Medications Current Facility-Administered Medications  Medication Dose Route Frequency Provider Last Rate Last Dose  . acetaminophen (TYLENOL) tablet 650 mg  650 mg Oral Q4H PRN Vania Rea, MD      . albuterol (PROVENTIL HFA;VENTOLIN HFA) 108 (90 BASE) MCG/ACT inhaler 2 puff  2 puff Inhalation Q6H PRN Vania Rea, MD      . amitriptyline (ELAVIL) tablet 25 mg  25 mg Oral QHS Vania Rea, MD   25 mg at 08/18/11 2228  . aspirin chewable tablet 81 mg  81 mg Oral Q24H Vania Rea, MD   81 mg at 08/19/11 0617  . diazepam (VALIUM) tablet 10 mg  10 mg Oral TID PRN Vania Rea, MD   10 mg at 08/16/11 2236  . enoxaparin (LOVENOX) injection 80 mg  80 mg Subcutaneous Q12H Crystal Southside, PHARMD   80 mg at 08/19/11 1000  . escitalopram (LEXAPRO) tablet 20 mg  20 mg Oral Q24H Vania Rea, MD   20 mg at 08/19/11 1191  . fenofibrate tablet 160 mg  160 mg Oral QHS Vania Rea, MD   160 mg at 08/18/11 2228  . furosemide (LASIX) tablet 20 mg  20 mg Oral Daily Marykay Lex, MD   20 mg at 08/19/11 1000  . gabapentin (NEURONTIN) capsule 300 mg  300 mg Oral QHS,MR X 1 Vania Rea, MD   300 mg at 08/18/11 2228  . glimepiride (AMARYL) tablet 2 mg  2 mg Oral QAC breakfast Vania Rea, MD   2 mg at 08/19/11  0834  . insulin aspart (novoLOG) injection 0-5 Units  0-5 Units Subcutaneous QHS Vania Rea, MD      . insulin aspart (novoLOG) injection 0-9 Units  0-9 Units Subcutaneous TID WC Vania Rea, MD   2 Units at 08/19/11 1228  . levothyroxine (SYNTHROID, LEVOTHROID) tablet 75 mcg  75 mcg Oral Q24H Vania Rea, MD   75 mcg at 08/19/11 [phone]  . magnesium hydroxide (MILK OF MAGNESIA) suspension 30 mL  30 mL Oral Daily PRN Chrystie Nose, MD      . metoprolol succinate (TOPROL-XL) 24 hr tablet 25 mg  25 mg Oral Q24H Vania Rea, MD   25 mg at 08/19/11 9562  . omega-3 acid ethyl esters (LOVAZA) capsule 1 g  1 g Oral Daily Vania Rea, MD   1 g at 08/19/11 1000  . ondansetron (ZOFRAN) injection 4 mg  4 mg Intravenous Q6H PRN Vania Rea, MD      . pantoprazole (PROTONIX) EC tablet 40 mg  40 mg Oral Q0600 Marykay Lex, MD   40 mg at 08/19/11 1308  . potassium chloride (K-DUR,KLOR-CON) CR tablet 10 mEq  10 mEq Oral Daily Vania Rea, MD   10 mEq at 08/19/11 1000  . simvastatin (ZOCOR) tablet  40 mg  40 mg Oral q1800 Vania Rea, MD   40 mg at 08/18/11 1715  . traMADol (ULTRAM) tablet 50 mg  50 mg Oral TID Vania Rea, MD   50 mg at 08/19/11 1000  . warfarin (COUMADIN) tablet 5 mg  5 mg Oral ONCE-1800 Madolyn Frieze, PHARMD      . warfarin (COUMADIN) tablet 7.5 mg  7.5 mg Oral ONCE-1800 Dannielle Karvonen Rockham, PHARMD   7.5 mg at 08/18/11 1715  . Warfarin - Pharmacist Dosing Inpatient   Does not apply q1800 Crystal Salomon Fick, PHARMD      . DISCONTD: clopidogrel (PLAVIX) tablet 75 mg  75 mg Oral Q breakfast Vania Rea, MD   75 mg at 08/18/11 0811  . DISCONTD: furosemide (LASIX) tablet 40 mg  40 mg Oral BID Nada Boozer, NP   40 mg at 08/18/11 0811  . DISCONTD: isosorbide dinitrate (ISORDIL) tablet 10 mg  10 mg Oral TID Vania Rea, MD   10 mg at 08/18/11 1036    PE: General appearance: alert, cooperative and no distress Lungs:  Decreased effort and BS bilaterally. Heart: regular rate and rhythm, S1, S2 normal, no murmur, click, rub or gallop Extremities: No LEE. Pulses: Radials 2+ and symmetric.  Decreased pedal pulses.  Lab Results:   [facility] 08/19/11 0625 08/18/11 1015  WBC 4.3 5.3  HGB 12.5 12.3  HCT 39.0 38.1  PLT 246 272   BMET  Basename 08/18/11 1015 08/17/11 1609  NA 140 140  K 3.4* 3.7  CL 98 97  CO2 31 34*  GLUCOSE 206* 163*  BUN 37* 32*  CREATININE 1.45* 1.56*  CALCIUM 9.7 9.7   PT/INR  Basename 08/19/11 0625 08/18/11 0500  LABPROT 18.2* 15.2  INR 1.48 1.18     Assessment/Plan Principal Problem:  *PE. history of PE in 2007, NOW with NEW Massive P.E.- most likely cause of troponin leak.08/14/11  Active Problems:  Essential hypertension, benign  COPD  CHEST PAIN, PRECORDIAL  DIABETES MELLITUS  HYPERLIPIDEMIA  Diastolic CHF, acute, resolved at discharge  Anxiety and depression  Plan: Lovenox to coumadin bridging.  BP 95/54 - 112/60.  Adding lower ext venous dopplers. BMET in AM.     LOS: 5 days    [name] W 08/19/2011 2:25 PM  I have seen and examined the patient along with Wilburt Finlay, PA.  I have reviewed the chart, notes and new data.  I agree with Bryan's note.  Brief Description: 66 year old woman presented with dull aching chest pressure and shortness of breath at baseline COPD on home oxygen at night. She had palpitations diaphoresis and near syncope. Due to mild T-wave changes and mild troponin elevation with a BNP of 2600, she was referred for cardiac catheterization which showed no circumflex coronary disease in a right dominant system.  Yesterday afternoon when granulating in the hall she was noted to have oxygen desaturations to the mid 80s. This is concerning a patient with history of PEs in 2007 therefore she was referred for CT angiography of the chest which demonstrated bilateral pulmonary emboli. This is most likely expiration for elevated troponin BMP and  symptoms. She was started on Lovenox on Saturday along with warfarin. She continues to be on oxygen when sleeping but currently has it off.  Key new complaints: none Key examination changes: lungs sound less coarse, no wheezing Key new findings / data: H/H stable, INR 1.48  PLAN:  Lovenox - lifelong Warfarin.  will check with CM re ? Potential for  short course of OP Lovenox (need to assess ability to obtain with Medicaid)  Initiate Lovenox Teaching  BP stable - d/c'd ACE-I  No significant CAD - continue statin, & BB, ASA   GI prophylaxis - PPI  Continue Synthroid   Marykay Lex, M.D., M.S. [facility] [address] [place], [place]  [address]  [phone]  08/19/2011 2:25 PM

## 2011-08-19 NOTE — Plan of Care (Signed)
Problem: Consults Goal: Diagnosis - Venous Thromboembolism (VTE) Choose a selection Outcome: Completed/Met Date Met:  08/19/11 PE (Pulmonary Embolism)

## 2011-08-19 NOTE — Progress Notes (Signed)
Patient with episode of feeling faint, short of breath, and mid chest/epigastric pain.  Patient pale in color.  Dr. Herbie Baltimore on floor and at patient's bedside.  Recently returned from the bathroom.  Blood pressure 102/62.  CBG 147.  Oxygen saturations 92-96 % 3 liters [place].  Orders received.   Patient stating she is starting to feel better after rest, color returning.  Will continue to monitor.  Colman Cater

## 2011-08-19 NOTE — Progress Notes (Addendum)
Came to bedside to evaluate sudden onset CP & SOB, felt faint & shaky.  Sx lasted ~5 min -- now improving.  Have not seen LE Venous doppler results.    Checking VS & CBG.   CBG ~140, BP ~102/62, HR 64 bpm.   Pt starting to return to normal   D/c lasix & encourage PO fluid intake as BP is low.  Give GI cocktail   Relatively unlikely to be cardiac given recent cath with essentially normal coronaries.    Lungs sound clear & heart exam normal.  Keep on O2 o/n & hydrate.    Had a long talk with pt's daughter / POA after this episode re: desire to review home meds --> pt is on standing QID Valium etc, Also - has intermittent lucid moments, but is often confused & argumentative thinking something is wrong with her. Dtr is concerned for early onset Dementia (pt's mother had Dementia, but not as young). This revelation clears up my concerns re an acute abnormality as I have noticed a similar trend-- was quite lucid & pleasant, seemed to fully comprehend her condition & care, but now & pre-cath has slowed mentation & speech.   Will review meds this evening & ask my partners to review prior to d/c to "streamline" if possible.   Marykay Lex, M.D., M.S. [facility] [address] [place], [place]  [address]  [phone]  08/19/2011 6:10 PM

## 2011-08-19 NOTE — Progress Notes (Signed)
Bilateral lower extremity venous duplex completed.  Preliminary report is negative for DVT, SVT, or a Baker's cyst. 

## 2011-08-19 NOTE — Consult Note (Signed)
Consult received and appreciated however pt quit back in 1996.

## 2011-08-20 DIAGNOSIS — Z7901 Long term (current) use of anticoagulants: Secondary | ICD-10-CM

## 2011-08-20 DIAGNOSIS — I272 Pulmonary hypertension, unspecified: Secondary | ICD-10-CM | POA: Diagnosis present

## 2011-08-20 LAB — PROTIME-INR
INR: 2.44 — ABNORMAL HIGH (ref 0.00–1.49)
Prothrombin Time: 26.9 seconds — ABNORMAL HIGH (ref 11.6–15.2)

## 2011-08-20 LAB — GLUCOSE, CAPILLARY
Glucose-Capillary: 164 mg/dL — ABNORMAL HIGH (ref 70–99)
Glucose-Capillary: 116 mg/dL — ABNORMAL HIGH (ref 70–99)

## 2011-08-20 LAB — BASIC METABOLIC PANEL
BUN: 24 mg/dL — ABNORMAL HIGH (ref 6–23)
Chloride: 99 mEq/L (ref 96–112)
GFR calc Af Amer: 60 mL/min — ABNORMAL LOW (ref >90)
GFR calc non Af Amer: 52 mL/min — ABNORMAL LOW (ref >90)
Potassium: 4 mEq/L (ref 3.5–5.1)

## 2011-08-20 MED ORDER — WARFARIN SODIUM 2.5 MG PO TABS
2.5000 mg | ORAL_TABLET | Freq: Once | ORAL | Status: AC
  Administered 2011-08-20: 2.5 mg via ORAL
  Filled 2011-08-20: qty 1

## 2011-08-20 NOTE — Progress Notes (Signed)
Subjective:  No complaints this am. Walks with a walker at home. On chronic home O2.  Objective:  Vital Signs in the last 24 hours: Temp:  [97.8 F (36.6 C)-98.6 F (37 C)] 98.3 F (36.8 C) (03/12 0901) Pulse Rate:  [56-75] 64  (03/12 0901) Resp:  [18-22] 22  (03/12 0901) BP: (88-112)/(55-72) 112/70 mmHg (03/12 0901) SpO2:  [91 %-96 %] 96 % (03/12 0901)  Intake/Output from previous day:  Intake/Output Summary (Last 24 hours) at 08/20/11 1120 Last data filed at 08/20/11 0900  Gross per 24 hour  Intake    240 ml  Output      0 ml  Net    240 ml    Physical Exam: General appearance: alert, cooperative, no distress and mildly obese Lungs: decreased breath sounds Heart: regular rate and rhythm   Rate: 66  Rhythm: normal sinus rhythm  Lab Results:  Basename 08/19/11 0625 08/18/11 1015  WBC 4.3 5.3  HGB 12.5 12.3  PLT 246 272    Basename 08/20/11 0500 08/18/11 1015  NA 140 140  K 4.0 3.4*  CL 99 98  CO2 35* 31  GLUCOSE 137* 206*  BUN 24* 37*  CREATININE 1.11* 1.45*   No results found for this basename: TROPONINI:2,CK,MB:2 in the last 72 hours Hepatic Function Panel No results found for this basename: PROT,ALBUMIN,AST,ALT,ALKPHOS,BILITOT,BILIDIR,IBILI in the last 72 hours No results found for this basename: CHOL in the last 72 hours  Basename 08/20/11 0500  INR 2.44*    Imaging: Imaging results have been reviewed  Cardiac Studies:  Assessment/Plan:   Principal Problem:  *PE. history of PE in 2007, NOW with NEW Massive P.E.- most likely cause of troponin leak.08/14/11   Active Problems:  CHEST PAIN, PRECORDIAL, normal coronaries by cath this admission  Anxiety and depression. (? Early dementia)  Chronic anticoagulation, pt will need lifelong anticoagulation  DIABETES MELLITUS  HYPERLIPIDEMIA  Essential hypertension, benign  COPD, on home O2  "for 71yrs"  Pulmonary HTN on CTA this admission  Plan-Will discuss with MD, INR yesterday was 1.48. ?  Continue Lovenox today.  Corine Shelter PA-C 08/20/2011, 11:20 AM   I have seen and examined the patient along with Corine Shelter PA-C.  I have reviewed the chart, notes and new data.  I agree with PA's note.  Key new complaints: chronic level of dyspnea  Key examination changes: no arrhythmia or clinical signs of HF Key new findings / data: INR 2.44 (sudden jump); creatinine improved  PLAN: Overlap Lovenox w warfarin at INR > 2 for at least 48 hours. Possible DC tomorrow  Thurmon Fair, MD, [facility] [phone] 08/20/2011, 2:10 PM

## 2011-08-20 NOTE — Consult Note (Signed)
Reveiwed by S.M. Wilson EdD 

## 2011-08-20 NOTE — Progress Notes (Signed)
ANTICOAGULATION CONSULT NOTE - Follow Up Consult  Pharmacy Consult for Lovenox and Coumadin Indication: pulmonary embolus  Allergies  Allergen Reactions  . Procaine Hcl Anaphylaxis  . Codeine     REACTION: Reaction not known  . Latex   . Penicillins Rash    Patient Measurements: Height: 5\' 2"  (157.5 cm) Weight: 177 lb 4 oz (80.4 kg) IBW/kg (Calculated) : 50.1   Vital Signs: Temp: 98.3 F (36.8 C) (03/12 0901) Temp src: Oral (03/12 0901) BP: 112/70 mmHg (03/12 0901) Pulse Rate: 64  (03/12 0901)  Labs:  Basename 08/20/11 0500 08/19/11 0625 08/18/11 1015 08/18/11 0500 08/17/11 1609  HGB -- 12.5 12.3 -- --  HCT -- 39.0 38.1 -- --  PLT -- 246 272 -- --  APTT -- -- -- -- --  LABPROT 26.9* 18.2* -- 15.2 --  INR 2.44* 1.48 -- 1.18 --  HEPARINUNFRC -- -- -- -- --  CREATININE 1.11* -- 1.45* -- 1.56*  CKTOTAL -- -- -- -- --  CKMB -- -- -- -- --  TROPONINI -- -- -- -- --   Estimated Creatinine Clearance: 50.9 ml/min (by C-G formula based on Cr of 1.11).  Assessment: 65yof on Lovenox bridging to Coumadin (Day 4 of minimum 5) for CTA confirmed bilateral PE. INR with significant rise overnight to 2.44 today.    Goal of Therapy:  INR 2-3 Anti-Xa level: 0.6-1.2  Plan:  1. Decrease Coumadin to 2.5mg  today 2. Continue Lovenox 80mg  SQ q12h.  Anticipate d/c Lovenox after tomorrow's dose and competition of 5d overlap. 3. Continue to monitor for bleeding complications. 4. Follow-up AM INR and SCr - adjust doses as indicated  [employer], Pharm.D., BCPS Clinical Pharmacist Pager [phone]  08/20/2011, 10:48 AM

## 2011-08-21 ENCOUNTER — Inpatient Hospital Stay (HOSPITAL_COMMUNITY): Payer: Medicaid Other

## 2011-08-21 LAB — BLOOD GAS, ARTERIAL
Acid-Base Excess: 5.7 mmol/L — ABNORMAL HIGH (ref 0.0–2.0)
Bicarbonate: 30.5 mEq/L — ABNORMAL HIGH (ref 20.0–24.0)
Drawn by: 320991
O2 Content: 2 L/min
O2 Saturation: 94.5 %
Patient temperature: 98.6
TCO2: 32.1 mmol/L (ref 0–100)
pCO2 arterial: 51.5 mmHg — ABNORMAL HIGH (ref 35.0–45.0)
pH, Arterial: 7.39 (ref 7.350–7.400)
pO2, Arterial: 72.1 mmHg — ABNORMAL LOW (ref 80.0–100.0)

## 2011-08-21 LAB — GLUCOSE, CAPILLARY: Glucose-Capillary: 163 mg/dL — ABNORMAL HIGH (ref 70–99)

## 2011-08-21 LAB — PROTIME-INR: Prothrombin Time: 26.6 seconds — ABNORMAL HIGH (ref 11.6–15.2)

## 2011-08-21 MED ORDER — WARFARIN SODIUM 5 MG PO TABS: 5.0000 mg | ORAL_TABLET | Freq: Once | ORAL | Status: DC

## 2011-08-21 MED ORDER — PANTOPRAZOLE SODIUM 40 MG PO TBEC: 40.0000 mg | DELAYED_RELEASE_TABLET | Freq: Every day | ORAL | Status: DC

## 2011-08-21 MED ORDER — WARFARIN SODIUM 5 MG PO TABS
5.0000 mg | ORAL_TABLET | Freq: Once | ORAL | Status: DC
  Filled 2011-08-21: qty 1

## 2011-08-21 NOTE — Progress Notes (Signed)
Pt. Seen and examined. Agree with the NP/PA-C note as written.  I reviewed preliminary head CT, which does not show evidence of bleeding. She is now awake, alert and eating breakfast. INR is therapeutic today. Exam is non-focal from a neurologic standpoint. Will eventually need an outpatient psychiatry evaluation.  Will need follow-up with PCP Dr. Guss Bunde in [place]. Can see me for follow-up in [place] in 2 weeks. Will need INR check in 3-5 days in the [place] office.  Chrystie Nose, MD, [facility] Attending Cardiologist [facility]

## 2011-08-21 NOTE — Discharge Summary (Signed)
Cherisse Carrell C. Dimond Crotty, MD, FACC Attending Cardiologist [facility]  

## 2011-08-21 NOTE — Progress Notes (Signed)
Discharge Note: Pt is alert and oriented, VS are stable, denies CP.  Telemetry & IV discontinued.  Discharge instructions reviewed with patient, pt verbalizes understanding.  Wheelchair transportation provided, all belongings with patient  

## 2011-08-21 NOTE — Progress Notes (Signed)
During change of shift report, pt was alert and oriented, sitting up and talking.  Upon assessment, pt found unresponsive. VS: 112/74; temp 98.1; 97% RA; HR=54. Called Rapid Response and MD,.  CT of Head and ABG ordered.  Husband at bedside.

## 2011-08-21 NOTE — Discharge Summary (Signed)
Patient ID: [name],  MRN: [id number], DOB/AGE: [birth year] 64 y.o.  Admit date: 08/14/2011 Discharge date: 08/21/2011  Primary Care Provider: Dr Felecia Shelling, Dr Guss Bunde Primary Cardiologist: Dr Sharyn Blitz  Discharge Diagnoses  Principal Problem:  *PE. history of PE in 2007, NOW with NEW Massive P.E.- most likely cause of troponin leak.08/14/11   Active Problems:  CHEST PAIN, PRECORDIAL, normal coronaries by cath this admission  Anxiety and depression, ( ? early dementia)  Chronic anticoagulation, pt will need lifelong anticoagulation  DIABETES MELLITUS  HYPERLIPIDEMIA  Essential hypertension, benign  COPD, on home O2  "for 50yrs"  Pulmonary HTN on CTA this admission    Procedures: Coronary angiogram 08/16/11   Hospital Course [name] is an 66 y.o. female. Multiple medical problems, including anxiety and depression, history of congestive heart failure with a normal ejection fraction, diabetes, hypertension, and chronic recurrent concerns about palpitations and an abnormal heart. The patient is scheduled to have an echocardiogram and evaluation of her renal arteries for renal artery stenosis on March 26, and had a followup appointment with Dr. Alanda Amass in early April. She was admitted through the ER 08/14/11 with chest pain. MI was ruled out. 2D echo was done 3/7- Nl LVF with grade 1 diastolic dysfunction. Diagnostic cath was done 08/16/11 by Dr Herbie Baltimore. This showed no significant CAD. The patient continued to complain of DOE. A CTA showed massive bilat pulmonary embolism and evidence of pulmonary HTN. She was started on lovenox to Coumadin. She did have two episodes where she became "unresponsive". Rhythm and vital signs were stable. She would respond to sternal rub and would resist opening her eyes. A CT of her head prior to discharge showed no acte findings. Dr Rennis Golden feels this is primarily a psychiatric issue. She is discharged 08/21/11 and will follow up in [place].     Discharge  Vitals:  Blood pressure 116/73, pulse 58, temperature 97.9 F (36.6 C), temperature source Oral, resp. rate 20, height 5\' 2"  (1.575 m), weight 80.423 kg (177 lb 4.8 oz), SpO2 97.00%.    Labs: Results for orders placed during the hospital encounter of 08/14/11 (from the past 48 hour(s))  GLUCOSE, CAPILLARY     Status: Normal   Collection Time   08/19/11  4:32 PM      Component Value Range Comment   Glucose-Capillary 79  70 - 99 (mg/dL)   GLUCOSE, CAPILLARY     Status: Abnormal   Collection Time   08/19/11  6:04 PM      Component Value Range Comment   Glucose-Capillary 147 (*) 70 - 99 (mg/dL)   GLUCOSE, CAPILLARY     Status: Abnormal   Collection Time   08/19/11  9:18 PM      Component Value Range Comment   Glucose-Capillary 119 (*) 70 - 99 (mg/dL)   PROTIME-INR     Status: Abnormal   Collection Time   08/20/11  5:00 AM      Component Value Range Comment   Prothrombin Time 26.9 (*) 11.6 - 15.2 (seconds)    INR 2.44 (*) 0.00 - 1.49    BASIC METABOLIC PANEL     Status: Abnormal   Collection Time   08/20/11  5:00 AM      Component Value Range Comment   Sodium 140  135 - 145 (mEq/L)    Potassium 4.0  3.5 - 5.1 (mEq/L)    Chloride 99  96 - 112 (mEq/L)    CO2 35 (*) 19 - 32 (mEq/L)  Glucose, Bld 137 (*) 70 - 99 (mg/dL)    BUN 24 (*) 6 - 23 (mg/dL)    Creatinine, Ser 1.61 (*) 0.50 - 1.10 (mg/dL)    Calcium 9.4  8.4 - 10.5 (mg/dL)    GFR calc non Af Amer 52 (*) >90 (mL/min)    GFR calc Af Amer 60 (*) >90 (mL/min)   GLUCOSE, CAPILLARY     Status: Abnormal   Collection Time   08/20/11  7:34 AM      Component Value Range Comment   Glucose-Capillary 164 (*) 70 - 99 (mg/dL)    Comment 1 Notify RN     GLUCOSE, CAPILLARY     Status: Normal   Collection Time   08/20/11 11:46 AM      Component Value Range Comment   Glucose-Capillary 99  70 - 99 (mg/dL)   GLUCOSE, CAPILLARY     Status: Abnormal   Collection Time   08/20/11  4:50 PM      Component Value Range Comment   Glucose-Capillary  102 (*) 70 - 99 (mg/dL)    Comment 1 Notify RN     GLUCOSE, CAPILLARY     Status: Abnormal   Collection Time   08/20/11  7:59 PM      Component Value Range Comment   Glucose-Capillary 116 (*) 70 - 99 (mg/dL)    Comment 1 Notify RN     PROTIME-INR     Status: Abnormal   Collection Time   08/21/11  5:32 AM      Component Value Range Comment   Prothrombin Time 26.6 (*) 11.6 - 15.2 (seconds)    INR 2.41 (*) 0.00 - 1.49    GLUCOSE, CAPILLARY     Status: Abnormal   Collection Time   08/21/11  8:08 AM      Component Value Range Comment   Glucose-Capillary 124 (*) 70 - 99 (mg/dL)   BLOOD GAS, ARTERIAL     Status: Abnormal   Collection Time   08/21/11  8:21 AM      Component Value Range Comment   O2 Content 2.0      Delivery systems NASAL CANNULA      pH, Arterial 7.390  7.350 - 7.400     pCO2 arterial 51.5 (*) 35.0 - 45.0 (mmHg)    pO2, Arterial 72.1 (*) 80.0 - 100.0 (mmHg)    Bicarbonate 30.5 (*) 20.0 - 24.0 (mEq/L)    TCO2 32.1  0 - 100 (mmol/L)    Acid-Base Excess 5.7 (*) 0.0 - 2.0 (mmol/L)    O2 Saturation 94.5      Patient temperature 98.6      Collection site LEFT RADIAL      Drawn by [id number]      Sample type ARTERIAL DRAW      Allens test (pass/fail) PASS  PASS    GLUCOSE, CAPILLARY     Status: Abnormal   Collection Time   08/21/11 11:56 AM      Component Value Range Comment   Glucose-Capillary 163 (*) 70 - 99 (mg/dL)     Disposition:  Follow-up Information    Follow up with [facility], MD. Call in 2 weeks.      Follow up with Chrystie Nose, MD. (office will call you)    Contact information:   [address] [place] [address] [phone]       Follow up with Governor Rooks, MD on 09/03/2011. (1;30)    Contact information:  [address]  [place] [address] [phone]          Discharge Medications:  Medication List  As of 08/21/2011 12:05 PM   TAKE these medications          albuterol 108 (90 BASE) MCG/ACT inhaler   Commonly known as: PROVENTIL HFA;VENTOLIN HFA   Inhale 2 puffs into the lungs every 6 (six) hours as needed.      amitriptyline 25 MG tablet   Commonly known as: ELAVIL   Take 25 mg by mouth at bedtime.      aspirin 81 MG chewable tablet   Chew 81 mg by mouth every morning.      diazepam 10 MG tablet   Commonly known as: VALIUM   Take 10 mg by mouth 3 (three) times daily.      escitalopram 20 MG tablet   Commonly known as: LEXAPRO   Take 20 mg by mouth every morning.      fish oil-omega-3 fatty acids 1000 MG capsule   Take 1 g by mouth every morning.      furosemide 40 MG tablet   Commonly known as: LASIX   Take 20 mg by mouth daily.      gabapentin 300 MG capsule   Commonly known as: NEURONTIN   Take 300 mg by mouth at bedtime and may repeat dose one time if needed.      glimepiride 1 MG tablet   Commonly known as: AMARYL   Take 2 tablets (2 mg total) by mouth daily before breakfast.      GREEN TEA SLIM Tabs   Take 1 tablet by mouth daily.      ipratropium-albuterol 0.5-2.5 (3) MG/3ML Soln   Commonly known as: DUONEB   Take 3 mLs by nebulization every 4 (four) hours as needed. For shortness of breath      isosorbide mononitrate 30 MG 24 hr tablet   Commonly known as: IMDUR   Take 1 tablet (30 mg total) by mouth daily.      levothyroxine 75 MCG tablet   Commonly known as: SYNTHROID, LEVOTHROID   Take 75 mcg by mouth every morning.      metoprolol succinate 25 MG 24 hr tablet   Commonly known as: TOPROL-XL   Take 25 mg by mouth every morning.      mulitivitamin with minerals Tabs   Take 1 tablet by mouth every morning.      OVER THE COUNTER MEDICATION   Take 1 capsule by mouth 3 (three) times daily. Vinegar capsule      pantoprazole 40 MG tablet   Commonly known as: PROTONIX   Take 1 tablet (40 mg total) by mouth daily at 6 (six) AM.      potassium chloride SA 20 MEQ tablet   Commonly known as: K-DUR,KLOR-CON    Take 10 mEq by mouth daily.      simvastatin 40 MG tablet   Commonly known as: ZOCOR   Take 40 mg by mouth at bedtime.      TRADJENTA 5 MG Tabs tablet   Generic drug: linagliptin   Take 5 mg by mouth daily.      traMADol 50 MG tablet   Commonly known as: ULTRAM   Take 50 mg by mouth 3 (three) times daily. For pain        TRILIPIX 135 MG capsule   Generic drug: Choline Fenofibrate   Take 135 mg by mouth every morning.  warfarin 5 MG tablet   Commonly known as: COUMADIN   Take 1 tablet (5 mg total) by mouth one time only at 6 PM.            Outstanding Labs/Studies: INR to be done in [place] Monday  Duration of Discharge Encounter: Greater than 30 minutes including physician time.  Jolene Provost PA-C 08/21/2011 12:05 PM

## 2011-08-21 NOTE — Significant Event (Signed)
Rapid Response Event Note  Overview: Time Called: 0815 Arrival Time: 0835 Event Type: Neurologic (altered LOC/lethargy)  Initial Focused Assessment: Called by patient's primary nurse to assist with patient due to decreased level of consciousness. Upon arrival to department, patient lying in bed with primary nurse and spouse at bedside. Patient response to sternal rub only, not moving any extremities. Neuro assessment as per charted in medical record. Per report received from bedside RN and PA, patient had a similar episode on 08/20/11 as well as has had similar episodes at home per spouse.    Interventions: Patient taken to CT for a head CT for further evaluation.    Event Summary: Upon arrival to CT, patient awoken and asked: "what are yall doing?" Patient was alert, oriented to self and place and verbalized that she had "one of her spells." Patient taken back to 3713, awake, alert and appopriate following all commands. Husband and primary RN at bedside.  Name of Physician Notified: Corine Shelter, [facility] (present in department upon my arrival) at      at    Outcome: Stayed in room and stabalized     Jimmey Ralph, Anette Guarneri

## 2011-08-21 NOTE — Progress Notes (Signed)
ANTICOAGULATION CONSULT NOTE - Follow Up Consult  Pharmacy Consult:  Lovenox and Coumadin Indication: pulmonary embolus  Allergies  Allergen Reactions  . Procaine Hcl Anaphylaxis  . Codeine     REACTION: Reaction not known  . Latex   . Penicillins Rash    Patient Measurements: Height: 5\' 2"  (157.5 cm) Weight: 177 lb 4.8 oz (80.423 kg) IBW/kg (Calculated) : 50.1   Vital Signs: Temp: 97.9 F (36.6 C) (03/13 0930) Temp src: Oral (03/13 0930) BP: 116/73 mmHg (03/13 0930) Pulse Rate: 58  (03/13 0930)  Labs:  Basename 08/21/11 0532 08/20/11 0500 08/19/11 0625  HGB -- -- 12.5  HCT -- -- 39.0  PLT -- -- 246  APTT -- -- --  LABPROT 26.6* 26.9* 18.2*  INR 2.41* 2.44* 1.48  HEPARINUNFRC -- -- --  CREATININE -- 1.11* --  CKTOTAL -- -- --  CKMB -- -- --  TROPONINI -- -- --   Estimated Creatinine Clearance: 50.9 ml/min (by C-G formula based on Cr of 1.11).  Assessment: 65 YOF on Lovenox bridging to Coumadin for CTA confirmed bilateral PE (overlap D# 5/5). INR therapeutic at 2.41 today, no bleeding documented.  Renal function appropriate for BID dosing of Lovenox.  Goal of Therapy:  INR 2-3 Anti-Xa level: 0.6-1.2   Plan:  - Coumadin 5mg  PO today - Continue Lovenox 80mg  SQ Q12H, f/u with d/c Lovenox order in AM. - Monitor INR, renal function   Phillips Climes, PharmD, BCPS 08/21/2011, 10:38 AM

## 2011-08-21 NOTE — Progress Notes (Signed)
Subjective:  Called to see secondary to mental status changes. Patient was responsive earlier this am and now wont respond to voice but will respond to sternal rub, she also tries to keep her eyes shut when examined. Her husband says she does this at home sometimes. This is similar to an episode the other day.  Objective:  Vital Signs in the last 24 hours: Temp:  [97.6 F (36.4 C)-98.3 F (36.8 C)] 98.1 F (36.7 C) (03/13 0637) Pulse Rate:  [53-64] 62  (03/13 0637) Resp:  [16-22] 20  (03/13 0637) BP: (90-118)/(56-72) 105/62 mmHg (03/13 0637) SpO2:  [93 %-100 %] 95 % (03/13 0637) Weight:  [80.423 kg (177 lb 4.8 oz)] 80.423 kg (177 lb 4.8 oz) (03/13 0500)  Intake/Output from previous day:  Intake/Output Summary (Last 24 hours) at 08/21/11 0844 Last data filed at 08/20/11 1700  Gross per 24 hour  Intake    720 ml  Output      0 ml  Net    720 ml    Physical Exam: NAD, uncooperative with exam  Chest clear, Cardiac RRR without murmur. Neuro pupils equal   Rate: 66  Rhythm: normal sinus rhythm  Lab Results:  Basename 08/19/11 0625 08/18/11 1015  WBC 4.3 5.3  HGB 12.5 12.3  PLT 246 272    Basename 08/20/11 0500 08/18/11 1015  NA 140 140  K 4.0 3.4*  CL 99 98  CO2 35* 31  GLUCOSE 137* 206*  BUN 24* 37*  CREATININE 1.11* 1.45*   No results found for this basename: TROPONINI:2,CK,MB:2 in the last 72 hours Hepatic Function Panel No results found for this basename: PROT,ALBUMIN,AST,ALT,ALKPHOS,BILITOT,BILIDIR,IBILI in the last 72 hours No results found for this basename: CHOL in the last 72 hours  Basename 08/21/11 0532  INR 2.41*    Imaging: Imaging results have been reviewed  Cardiac Studies:  Assessment/Plan:   Principal Problem:  *PE. history of PE in 2007, NOW with NEW Massive P.E.- most likely cause of troponin leak.08/14/11   Active Problems:  Mental status changes, r/o CVA  CHEST PAIN, PRECORDIAL, normal coronaries by cath this admission  Anxiety and  depression, ( ? early dementia)  Chronic anticoagulation, pt will need lifelong anticoagulation  DIABETES MELLITUS  HYPERLIPIDEMIA  Essential hypertension, benign  COPD, on home O2  "for 72yrs"  Pulmonary HTN on CTA this admission  Plan- ABG done PCO2 51, O2 decreased to 1L. Will check head CT as she is on Coumadin but I suspect this is psychosomatic or secondary to some baseline dementia.    Corine Shelter PA-C 08/21/2011, 8:44 AM

## 2011-08-21 NOTE — Discharge Instructions (Signed)
Warfarin  Warfarin is a blood thinner (anticoagulant) medicine. It is used to keep clots from forming in your blood. When you take warfarin, you may bruise easily. You may also bleed a little longer if you cut yourself.  Before taking warfarin, tell your doctor if:  You take any other medicine for your heart or blood pressure.   You are pregnant.   You plan to get pregnant.   You are breastfeeding.   You are allergic to any medicine.  HOME CARE  Take warfarin as told by your doctor. Do not stop the medicine unless your doctor tells you to.   Take your medicine at the same time every day.   Do not take anything that has aspirin in it unless your doctor says it is okay.   Do not drink alcohol.   Tell all your doctors and dentists that you are taking warfarin before they treat you or give you any medicines.   Keep all your appointments for doctor visits and blood tests.   Keep warfarin out of reach of children. Do not share warfarin with anyone.   Eat about the same amount of vitamin K foods every day.   High vitamin K foods: Beef liver. Pork liver. Green tea. Broccoli. Brussels sprouts. Cauliflower. Chickpeas. Kale. Spinach. Turnip greens.   Medium vitamin K foods: Chicken liver. Pork tenderloin. Cheddar cheese. Rolled oats. Coffee. Asparagus. Cabbage. Iceberg lettuce.   Low vitamin K foods: Apples. Butter. Bananas. Skim or 1% milk. Orange rice. Canned pears. White bread. Strawberries. Corn. Tomatoes. Green beans. Eggs. Potatoes. Tomasa Blase. Pumpkin. Chicken breasts. Ground beef. Oil (except soybean oil).  GET HELP RIGHT AWAY IF:  You miss a dose of warfarin. Do not take 2 doses at the same time.   You have a skin rash.   You have heavy or unusual bleeding.   There is blood in your pee (urine) or poop (stool).   You have side effects from medicine that do not get better after a few days.  MAKE SURE YOU:  Understand these instructions.   Will watch your condition.   Will  get help right away if you are not doing well or get worse.  Document Released: 06/29/2010 Document Revised: 05/16/2011 Document Reviewed: 06/29/2010 [facility] Patient Information 2012 [place], [place].Pulmonary Embolus A pulmonary (lung) embolus (PE) is a blood clot that has traveled from another place in the body to the lung. Most clots come from deep veins in the legs or pelvis. PE is a dangerous and potentially life-threatening condition that can be treated if identified. CAUSES Blood clots form in a vein for different reasons. Usually several things cause blood clots. They include:  The flow of blood slows down.   The inside of the vein is damaged in some way.   The person has a condition that makes the blood clot more easily. These conditions may include:   Older age (especially over 39 years old).   Having a history of blood clots.   Having major or lengthy surgery. Hip surgery is particularly high-risk.   Breaking a hip or leg.   Sitting or lying still for a long time.   Cancer or cancer treatment.   Having a long, thin tube (catheter) placed inside a vein during a medical procedure.   Being overweight (obese).   Pregnancy and childbirth.   Medicines with estrogen.   Smoking.   Other circulation or heart problems.  SYMPTOMS  The symptoms of a PE usually start suddenly and include:  Shortness of breath.   Coughing.   Coughing up blood or blood-tinged mucus (phlegm).   Chest pain. Pain is often worse with deep breaths.   Rapid heartbeat.  DIAGNOSIS  If a PE is suspected, your caregiver will take a medical history and carry out a physical exam. Your caregiver will check for the risk factors listed above. Tests that also may be required include:  Blood tests, including studies of the clotting properties of your blood.   Imaging tests. Ultrasound, CT, MRI, and other tests can all be used to see if you have clots in your legs or lungs. If you have a clot in your  legs and have breathing or chest problems, your caregiver may conclude that you have a clot in your lungs. Further lung tests may not be needed.   An EKG can look for heart strain from blood clots in the lungs.  PREVENTION   Exercise the legs regularly. Take a brisk 30 minute walk every day.   Maintain a weight that is appropriate for your height.   Avoid sitting or lying in bed for long periods of time without moving your legs.   Women, particularly those over the age of 107, should consider the risks and benefits of taking estrogen medicines, including birth control pills.   Do not smoke, especially if you take estrogen medicines.   Long-distance travel can increase your risk. You should exercise your legs by walking or pumping the muscles every hour.   In hospital prevention:   Your caregiver will assess your need for preventive PE care (prophylaxis) when you are admitted to the hospital. If you are having surgery, your surgeon will assess you the day of or day after surgery.   Prevention may include medical and nonmedical measures.  TREATMENT   The most common treatment for a PE is blood thinning (anticoagulant) medicine, which reduces the blood's tendency to clot. Anticoagulants can stop new blood clots from forming and old ones from growing. They cannot dissolve existing clots. Your body does this by itself over time. Anticoagulants can be given by mouth, by intravenous (IV) access, or by injection. Your caregiver will determine the best program for you.   Less commonly, clot-dissolving drugs (thrombolytics) are used to dissolve a PE. They carry a high risk of bleeding, so they are used mainly in severe cases.   Very rarely, a blood clot in the leg needs to be removed surgically.   If you are unable to take anticoagulants, your caregiver may arrange for you to have a filter placed in a main vein in your belly (abdomen). This filter prevents clots from traveling to your lungs.    HOME CARE INSTRUCTIONS   Take all medicines prescribed by your caregiver. Follow the directions carefully.   You will most likely continue taking anticoagulants after you leave the hospital. Your caregiver will advise you on the length of treatment (usually 3 to 6 months, sometimes for life).   Taking too much or too little of an anticoagulant is dangerous. While taking this type of medicine, you will need to have regular blood tests to be sure the dose is correct. The dose can change for many reasons. It is critically important that you take this medicine exactly as prescribed and that you have blood tests exactly as directed.   Many foods can interfere with anticoagulants. These include foods high in vitamin K, such as spinach, kale, broccoli, cabbage, collard and turnip greens, Brussels sprouts, peas, cauliflower, seaweed, parsley,  beef and pork liver, green tea, and soybean oil. Your caregiver should discuss limits on these foods with you or you should arrange a visit with a dietician to answer your questions.   Many medicines can interfere with anticoagulants. You must tell your caregiver about any and all medicines you take. This includesall vitamins and supplements. Be especially cautious with aspirin and anti-inflammatory medicines. Ask your caregiver before taking these.   Anticoagulants can have side effects, mostly excessive bruising or bleeding. You will need to hold pressure over cuts for longer than usual. Avoid alcoholic drinks or consume only very small amounts while taking this medicine.   If you are taking an anticoagulant:   Wear a medical alert bracelet.   Notify your dentist or other caregivers before procedures.   Avoid contact sports.   Ask your caregiver how soon you can go back to normal activities. Not being active can lead to new clots. Ask for a list of what you should and should not do.   Exercise your lower leg muscles. This is important while traveling.    You may need to wear compression stockings. These are tight elastic stockings that apply pressure to the lower legs. This can help keep the blood in the legs from clotting.   If you are a smoker, you should quit.   Learn as much as you can about pulmonary embolisms.  SEEK MEDICAL CARE IF:   You notice a rapid heartbeat.   You feel weaker or more tired than usual.   You feel faint.   You notice increased bruising.   Your symptoms are not getting better in the time expected.   You are having side effects of medicine.   You have an oral temperature above 102 F (38.9 C).   You discover other family members with blood clots. This may require further testing for inherited diseases or conditions.  SEEK IMMEDIATE MEDICAL CARE IF:   You have chest pain.   You have trouble breathing.   You have new or increased swelling or pain in one leg.   You cough up blood.   You notice blood in vomit, in a bowel movement, or in urine.   You have an oral temperature above 102 F (38.9 C), not controlled by medicine.  You may have another PE. A blood clot in the lungs is a medical emergency. Call your local emergency services (911 in [place] to get to the nearest hospital or clinic. Do not drive yourself. MAKE SURE YOU:   Understand these instructions.   Will watch your condition.   Will get help right away if you are not doing well or get worse.  Document Released: 05/24/2000 Document Revised: 05/16/2011 Document Reviewed: 11/28/2008 [facility] Patient Information 2012 [place], [place].   [place] office will call you for an appointment to get INR done Monday or Tuesday

## 2011-09-09 ENCOUNTER — Other Ambulatory Visit (HOSPITAL_COMMUNITY): Payer: Self-pay | Admitting: Cardiovascular Disease

## 2011-09-09 DIAGNOSIS — I2699 Other pulmonary embolism without acute cor pulmonale: Secondary | ICD-10-CM

## 2011-09-18 ENCOUNTER — Encounter (HOSPITAL_COMMUNITY): Payer: Self-pay | Admitting: *Deleted

## 2011-09-18 ENCOUNTER — Emergency Department (HOSPITAL_COMMUNITY): Payer: Medicaid Other

## 2011-09-18 ENCOUNTER — Emergency Department (HOSPITAL_COMMUNITY)
Admission: EM | Admit: 2011-09-18 | Discharge: 2011-09-18 | Disposition: A | Discharge status: Home / Self Care | Payer: Medicaid Other | Attending: Emergency Medicine | Admitting: Emergency Medicine

## 2011-09-18 DIAGNOSIS — W19XXXA Unspecified fall, initial encounter: Secondary | ICD-10-CM

## 2011-09-18 DIAGNOSIS — IMO0002 Reserved for concepts with insufficient information to code with codable children: Secondary | ICD-10-CM | POA: Insufficient documentation

## 2011-09-18 DIAGNOSIS — R110 Nausea: Secondary | ICD-10-CM | POA: Insufficient documentation

## 2011-09-18 DIAGNOSIS — S0093XA Contusion of unspecified part of head, initial encounter: Secondary | ICD-10-CM

## 2011-09-18 DIAGNOSIS — W1809XA Striking against other object with subsequent fall, initial encounter: Secondary | ICD-10-CM | POA: Insufficient documentation

## 2011-09-18 DIAGNOSIS — I129 Hypertensive chronic kidney disease with stage 1 through stage 4 chronic kidney disease, or unspecified chronic kidney disease: Secondary | ICD-10-CM | POA: Insufficient documentation

## 2011-09-18 DIAGNOSIS — J4489 Other specified chronic obstructive pulmonary disease: Secondary | ICD-10-CM | POA: Insufficient documentation

## 2011-09-18 DIAGNOSIS — R51 Headache: Secondary | ICD-10-CM | POA: Insufficient documentation

## 2011-09-18 DIAGNOSIS — N189 Chronic kidney disease, unspecified: Secondary | ICD-10-CM | POA: Insufficient documentation

## 2011-09-18 DIAGNOSIS — Z86711 Personal history of pulmonary embolism: Secondary | ICD-10-CM | POA: Insufficient documentation

## 2011-09-18 DIAGNOSIS — E119 Type 2 diabetes mellitus without complications: Secondary | ICD-10-CM | POA: Insufficient documentation

## 2011-09-18 DIAGNOSIS — S0083XA Contusion of other part of head, initial encounter: Secondary | ICD-10-CM | POA: Insufficient documentation

## 2011-09-18 DIAGNOSIS — I509 Heart failure, unspecified: Secondary | ICD-10-CM | POA: Insufficient documentation

## 2011-09-18 DIAGNOSIS — E079 Disorder of thyroid, unspecified: Secondary | ICD-10-CM | POA: Insufficient documentation

## 2011-09-18 DIAGNOSIS — E785 Hyperlipidemia, unspecified: Secondary | ICD-10-CM | POA: Insufficient documentation

## 2011-09-18 DIAGNOSIS — J449 Chronic obstructive pulmonary disease, unspecified: Secondary | ICD-10-CM | POA: Insufficient documentation

## 2011-09-18 DIAGNOSIS — Z7901 Long term (current) use of anticoagulants: Secondary | ICD-10-CM | POA: Insufficient documentation

## 2011-09-18 DIAGNOSIS — R42 Dizziness and giddiness: Secondary | ICD-10-CM | POA: Insufficient documentation

## 2011-09-18 DIAGNOSIS — S0003XA Contusion of scalp, initial encounter: Secondary | ICD-10-CM | POA: Insufficient documentation

## 2011-09-18 LAB — BASIC METABOLIC PANEL
CO2: 30 mEq/L (ref 19–32)
Chloride: 100 mEq/L (ref 96–112)
GFR calc Af Amer: 57 mL/min — ABNORMAL LOW (ref >90)
Potassium: 3.4 mEq/L — ABNORMAL LOW (ref 3.5–5.1)

## 2011-09-18 LAB — CBC
HCT: 39.3 % (ref 36.0–46.0)
MCV: 89.7 fL (ref 78.0–100.0)
Platelets: 324 10*3/uL (ref 150–400)
RBC: 4.38 MIL/uL (ref 3.87–5.11)
RDW: 14.8 % (ref 11.5–15.5)
WBC: 13.6 10*3/uL — ABNORMAL HIGH (ref 4.0–10.5)

## 2011-09-18 LAB — GLUCOSE, CAPILLARY
Glucose-Capillary: 46 mg/dL — ABNORMAL LOW (ref 70–99)
Glucose-Capillary: 142 mg/dL — ABNORMAL HIGH (ref 70–99)

## 2011-09-18 LAB — PROTIME-INR
INR: 6.24 (ref 0.00–1.49)
Prothrombin Time: 56 seconds — ABNORMAL HIGH (ref 11.6–15.2)

## 2011-09-18 LAB — DIFFERENTIAL
Basophils Absolute: 0 10*3/uL (ref 0.0–0.1)
Eosinophils Relative: 0 % (ref 0–5)
Lymphocytes Relative: 21 % (ref 12–46)
Monocytes Relative: 8 % (ref 3–12)

## 2011-09-18 NOTE — ED Notes (Signed)
EMD aware of CBG. Order to feed pt . Pt given OJ with sugar and a meal tray.

## 2011-09-18 NOTE — Discharge Instructions (Signed)
INR was 6.24.  Hold your Coumadin until seen by her primary care Dr. on Friday. Ice pack to for head. CT scan of head was normal.

## 2011-09-18 NOTE — ED Notes (Signed)
CRITICAL VALUE ALERT  Critical value received:  INR 6.24  Date of notification: 09/18/2011  Time of notification:  1723  Critical value read back:yes  Nurse who received alert:   Santiago Bur, Theola Sequin  MD notified (1st page):  Dr. Adriana Simas  Time of first page:  1723  MD notified (2nd page):  Time of second page:  Responding MD:  Dr. Donnetta Hutching  Time MD responded:  [phone]

## 2011-09-18 NOTE — ED Notes (Signed)
Patient is on blood thinners

## 2011-09-18 NOTE — ED Notes (Signed)
Assumed care for discharge.  Discharge instructions given and reviewed with patient.  Patient verbalized understanding to hold her Coumadin until seen by her PMD on Friday.  Patient ambulatory with a steady gait.  Husband accompanied discharge to drive home.

## 2011-09-18 NOTE — ED Notes (Signed)
Fell, hit head on cement

## 2011-09-18 NOTE — ED Provider Notes (Signed)
History   Scribed for Monica Hutching, MD, the patient was seen in room APA12/APA12 . This chart was scribed by Lewanda Rife.   CSN: [id number]  Arrival date & time 09/18/11  1527   First MD Initiated Contact with Patient 09/18/11 1545      Chief Complaint  Patient presents with  . Fall    (Consider location/radiation/quality/duration/timing/severity/associated sxs/prior treatment) HPI [name] is a 66 y.o. female who presents to the Emergency Department complaining of fall earlier today. Pt states she was bending over to pick up a dime when she began to feel increasingly dizzy and toppled over. Pt states she bumped the frontal aspect of her head on the cement when she fell about 2 feet from the ground. Pt reports associated moderate headache, nausea, and dizziness. Pt denies LOC. Pt states she is currently on coumadin since recent diagnosis of pulmonary embolism. Pt denies taking any medications to treat symptoms today prior to arrival..    Past Medical History  Diagnosis Date  . Hypertension   . Diabetes mellitus   . Arthritis   . Blood transfusion   . Hx pulmonary embolism   . Anemia   . COPD (chronic obstructive pulmonary disease)   . Depression   . Anxiety   . Gastroesophageal reflux disease   . Osteoporosis   . DDD (degenerative disc disease)   . Uterine prolapse   . Uterine prolapse   . Enlarged heart   . Asthma   . CHF (congestive heart failure)   . Emphysema of lung   . Heart murmur   . Hyperlipidemia   . Thyroid disease   . Shortness of breath   . Chronic kidney disease     lt kidney limited fxn  . Seizures     once  . Headache   . Dysrhythmia     Past Surgical History  Procedure Date  . Hiatel hernia repair   . Tubal ligation     Family History  Problem Relation Age of Onset  . Heart disease Mother   . Stroke Mother   . Prostate cancer Father   . Cancer Father     bone  . Liver cancer Brother   . Cancer Brother     leukemia  .  Alzheimer's disease      family history  . Cancer Daughter     breast  . Cancer Paternal Uncle     unsure of what kind    History  Substance Use Topics  . Smoking status: Former Games developer  . Smokeless tobacco: Not on file   Comment: quit in 1996  . Alcohol Use: No    OB History    Grav Para Term Preterm Abortions TAB SAB Ect Mult Living                  Review of Systems  Constitutional: Negative.  Negative for diaphoresis.  Respiratory: Negative.   Cardiovascular: Negative.   Gastrointestinal: Positive for nausea.  Musculoskeletal: Negative.   Skin: Negative.   Neurological: Positive for dizziness and headaches.  Hematological: Negative.   Psychiatric/Behavioral: Negative.   All other systems reviewed and are negative.    Allergies  Procaine hcl; Codeine; Latex; and Penicillins  Home Medications   Current Outpatient Rx  Name Route Sig Dispense Refill  . ALBUTEROL SULFATE HFA 108 (90 BASE) MCG/ACT IN AERS Inhalation Inhale 2 puffs into the lungs every 6 (six) hours as needed.    Marland Kitchen AMITRIPTYLINE HCL 25 MG  PO TABS Oral Take 25 mg by mouth at bedtime.      . ASPIRIN 81 MG PO CHEW Oral Chew 81 mg by mouth every morning.    . CHOLINE FENOFIBRATE 135 MG PO CPDR Oral Take 135 mg by mouth every morning.     Marland Kitchen DIAZEPAM 10 MG PO TABS Oral Take 10 mg by mouth 3 (three) times daily.     Marland Kitchen ESCITALOPRAM OXALATE 20 MG PO TABS Oral Take 20 mg by mouth every morning.     Marland Kitchen OMEGA-3 FATTY ACIDS 1000 MG PO CAPS Oral Take 1 g by mouth every morning.     . FUROSEMIDE 40 MG PO TABS Oral Take 20 mg by mouth daily.     Marland Kitchen GABAPENTIN 300 MG PO CAPS Oral Take 300 mg by mouth at bedtime and may repeat dose one time if needed.    Marland Kitchen GLIMEPIRIDE 1 MG PO TABS Oral Take 2 tablets (2 mg total) by mouth daily before breakfast. 30 tablet 0  . IPRATROPIUM-ALBUTEROL 0.5-2.5 (3) MG/3ML IN SOLN Nebulization Take 3 mLs by nebulization every 4 (four) hours as needed. For shortness of breath     . ISOSORBIDE  MONONITRATE ER 30 MG PO TB24 Oral Take 1 tablet (30 mg total) by mouth daily. 30 tablet 11  . LEVOTHYROXINE SODIUM 75 MCG PO TABS Oral Take 75 mcg by mouth every morning.     Marland Kitchen LINAGLIPTIN 5 MG PO TABS Oral Take 5 mg by mouth daily.    Marland Kitchen METOPROLOL SUCCINATE ER 25 MG PO TB24 Oral Take 25 mg by mouth every morning.     Marland Kitchen GREEN TEA SLIM PO TABS Oral Take 1 tablet by mouth daily.    . ADULT MULTIVITAMIN W/MINERALS CH Oral Take 1 tablet by mouth every morning.    Marland Kitchen OVER THE COUNTER MEDICATION Oral Take 1 capsule by mouth 3 (three) times daily. Vinegar capsule    . PANTOPRAZOLE SODIUM 40 MG PO TBEC Oral Take 1 tablet (40 mg total) by mouth daily at 6 (six) AM. 30 tablet 5  . POTASSIUM CHLORIDE CRYS ER 20 MEQ PO TBCR Oral Take 10 mEq by mouth daily.     Marland Kitchen SIMVASTATIN 40 MG PO TABS Oral Take 40 mg by mouth at bedtime.     . TRAMADOL HCL 50 MG PO TABS Oral Take 50 mg by mouth 3 (three) times daily. For pain     . WARFARIN SODIUM 5 MG PO TABS Oral Take 1 tablet (5 mg total) by mouth one time only at 6 PM. 30 tablet 5    BP 115/53  Pulse 59  Temp 98.6 F (37 C)  Resp 20  Ht 5\' 2"  (1.575 m)  Wt 163 lb (73.936 kg)  BMI 29.81 kg/m2  SpO2 94%  Physical Exam  Nursing note and vitals reviewed. Constitutional: She is oriented to person, place, and time. She appears well-developed and well-nourished.  HENT:  Head: Normocephalic and atraumatic.       Minimal hematoma on left frontal scalp   Eyes: Conjunctivae and EOM are normal. Pupils are equal, round, and reactive to light.  Neck: Normal range of motion. Neck supple.  Cardiovascular: Normal rate and regular rhythm.   Pulmonary/Chest: Effort normal and breath sounds normal.  Abdominal: Soft. Bowel sounds are normal.  Musculoskeletal: Normal range of motion.       Minimal abrasion on right distal posterior forearm and right knee   Neurological: She is alert and oriented to person, place,  and time.  Skin: Skin is warm and dry.  Psychiatric: She  has a normal mood and affect.    ED Course  Procedures (including critical care time)  4:14 PM Pt informed of labs and imaging to be ordered. CBC, BMP, PT/INR, EKG and CT head. Will follow up with pt after results are reviewed.  Labs Reviewed  CBC - Abnormal; Notable for the following:    WBC 13.6 (*)    All other components within normal limits  DIFFERENTIAL - Abnormal; Notable for the following:    Neutro Abs 9.6 (*)    Monocytes Absolute 1.1 (*)    All other components within normal limits  BASIC METABOLIC PANEL - Abnormal; Notable for the following:    Potassium 3.4 (*)    Glucose, Bld 53 (*)    BUN 26 (*)    Creatinine, Ser 1.16 (*)    GFR calc non Af Amer 49 (*)    GFR calc Af Amer 57 (*)    All other components within normal limits  PROTIME-INR - Abnormal; Notable for the following:    Prothrombin Time 56.0 (*)    INR 6.24 (*)    All other components within normal limits  GLUCOSE, CAPILLARY - Abnormal; Notable for the following:    Glucose-Capillary 46 (*)    All other components within normal limits  GLUCOSE, CAPILLARY - Abnormal; Notable for the following:    Glucose-Capillary 142 (*)    All other components within normal limits   No results found. Ct Head Wo Contrast  09/18/2011  *RADIOLOGY REPORT*  Clinical Data: Syncope.  Fall with head trauma.  CT HEAD WITHOUT CONTRAST  Technique:  Contiguous axial images were obtained from the base of the skull through the vertex without contrast.  Comparison: 08/21/2011  Findings: The brain does not show any evidence of acute infarction, mass lesion, hemorrhage, hydrocephalus or extra-axial collection. No skull fracture.  No traumatic fluid in the sinuses.  There is mild generalized atrophy.  There is insignificant physiologic calcification in the basal ganglia.  IMPRESSION: No acute or traumatic finding.  Mild age related atrophy.  Original Report Authenticated By: Thomasenia Sales, M.D.    No diagnosis found.    Date:09/18/2011  Rate: 70  Rhythm: normal sinus rhythm  QRS Axis: normal  Intervals: normal  ST/T Wave abnormalities: normal  Conduction Disutrbances: none  Narrative Interpretation: unremarkable     MDM  Patient has no neurological deficits. Head CT normal. INR noted to be elevated. Patient instructed to hold Coumadin until seen by primary care Dr. on Friday       I personally performed the services described in this documentation, which was scribed in my presence. The recorded information has been reviewed and considered.    Monica Hutching, MD 09/18/11 1807

## 2011-09-30 ENCOUNTER — Other Ambulatory Visit (HOSPITAL_COMMUNITY): Payer: Medicaid Other

## 2011-11-10 ENCOUNTER — Emergency Department (HOSPITAL_COMMUNITY)
Admission: EM | Admit: 2011-11-10 | Discharge: 2011-11-10 | Disposition: A | Discharge status: Home / Self Care | Payer: Medicaid Other | Attending: Emergency Medicine | Admitting: Emergency Medicine

## 2011-11-10 ENCOUNTER — Emergency Department (HOSPITAL_COMMUNITY): Payer: Medicaid Other

## 2011-11-10 ENCOUNTER — Encounter (HOSPITAL_COMMUNITY): Payer: Self-pay | Admitting: *Deleted

## 2011-11-10 DIAGNOSIS — E079 Disorder of thyroid, unspecified: Secondary | ICD-10-CM | POA: Insufficient documentation

## 2011-11-10 DIAGNOSIS — S46909A Unspecified injury of unspecified muscle, fascia and tendon at shoulder and upper arm level, unspecified arm, initial encounter: Secondary | ICD-10-CM | POA: Insufficient documentation

## 2011-11-10 DIAGNOSIS — Z86711 Personal history of pulmonary embolism: Secondary | ICD-10-CM | POA: Insufficient documentation

## 2011-11-10 DIAGNOSIS — M810 Age-related osteoporosis without current pathological fracture: Secondary | ICD-10-CM | POA: Insufficient documentation

## 2011-11-10 DIAGNOSIS — F329 Major depressive disorder, single episode, unspecified: Secondary | ICD-10-CM | POA: Insufficient documentation

## 2011-11-10 DIAGNOSIS — W2209XA Striking against other stationary object, initial encounter: Secondary | ICD-10-CM | POA: Insufficient documentation

## 2011-11-10 DIAGNOSIS — I509 Heart failure, unspecified: Secondary | ICD-10-CM | POA: Insufficient documentation

## 2011-11-10 DIAGNOSIS — K219 Gastro-esophageal reflux disease without esophagitis: Secondary | ICD-10-CM | POA: Insufficient documentation

## 2011-11-10 DIAGNOSIS — S40019A Contusion of unspecified shoulder, initial encounter: Secondary | ICD-10-CM

## 2011-11-10 DIAGNOSIS — Z7901 Long term (current) use of anticoagulants: Secondary | ICD-10-CM | POA: Insufficient documentation

## 2011-11-10 DIAGNOSIS — J438 Other emphysema: Secondary | ICD-10-CM | POA: Insufficient documentation

## 2011-11-10 DIAGNOSIS — S4980XA Other specified injuries of shoulder and upper arm, unspecified arm, initial encounter: Secondary | ICD-10-CM | POA: Insufficient documentation

## 2011-11-10 DIAGNOSIS — E785 Hyperlipidemia, unspecified: Secondary | ICD-10-CM | POA: Insufficient documentation

## 2011-11-10 DIAGNOSIS — I517 Cardiomegaly: Secondary | ICD-10-CM | POA: Insufficient documentation

## 2011-11-10 DIAGNOSIS — I129 Hypertensive chronic kidney disease with stage 1 through stage 4 chronic kidney disease, or unspecified chronic kidney disease: Secondary | ICD-10-CM | POA: Insufficient documentation

## 2011-11-10 DIAGNOSIS — N189 Chronic kidney disease, unspecified: Secondary | ICD-10-CM | POA: Insufficient documentation

## 2011-11-10 DIAGNOSIS — E119 Type 2 diabetes mellitus without complications: Secondary | ICD-10-CM | POA: Insufficient documentation

## 2011-11-10 DIAGNOSIS — IMO0002 Reserved for concepts with insufficient information to code with codable children: Secondary | ICD-10-CM | POA: Insufficient documentation

## 2011-11-10 DIAGNOSIS — M129 Arthropathy, unspecified: Secondary | ICD-10-CM | POA: Insufficient documentation

## 2011-11-10 DIAGNOSIS — F411 Generalized anxiety disorder: Secondary | ICD-10-CM | POA: Insufficient documentation

## 2011-11-10 DIAGNOSIS — Z79899 Other long term (current) drug therapy: Secondary | ICD-10-CM | POA: Insufficient documentation

## 2011-11-10 DIAGNOSIS — F3289 Other specified depressive episodes: Secondary | ICD-10-CM | POA: Insufficient documentation

## 2011-11-10 MED ORDER — HYDROCODONE-ACETAMINOPHEN 5-325 MG PO TABS: 2.0000 | ORAL_TABLET | Freq: Four times a day (QID) | ORAL | Status: AC | PRN

## 2011-11-10 NOTE — Discharge Instructions (Signed)

## 2011-11-10 NOTE — ED Notes (Signed)
Pt states door hit her in the shoulder yesterday and is now unable to lift arm secondary to pain. Limited ROM noted. Slight deformity noted. Pt also states last week she fell down 2 concrete steps and rolled down an embankment, denies injury during this fall. Pt walks with a 4 prong cane daily. Tramadol and 10 mg valium taken today with no relief per pt. Pain is severe when attempting to move.

## 2011-11-10 NOTE — ED Notes (Signed)
Pt states that she got hit in left shoulder on Friday by a door, pt c/o pain to left shoulder and arm area. Pt able to wiggle fingers, feel touch, positive pulse, has problems raising left arm upward due to pain.

## 2011-11-10 NOTE — ED Provider Notes (Signed)
History     CSN: [id number]  Arrival date & time 11/10/11  1341   First MD Initiated Contact with Patient 11/10/11 1451      Chief Complaint  Patient presents with  . Shoulder Injury    (Consider location/radiation/quality/duration/timing/severity/associated sxs/prior treatment) HPI Pt with multiple medical problems reports she was hit on the L shoulder by a door 2 days ago. Complaining of moderate to severe aching pain, worse with movement. Past Medical History  Diagnosis Date  . Hypertension   . Diabetes mellitus   . Arthritis   . Blood transfusion   . Hx pulmonary embolism   . Anemia   . COPD (chronic obstructive pulmonary disease)   . Depression   . Anxiety   . Gastroesophageal reflux disease   . Osteoporosis   . DDD (degenerative disc disease)   . Uterine prolapse   . Uterine prolapse   . Enlarged heart   . Asthma   . CHF (congestive heart failure)   . Emphysema of lung   . Heart murmur   . Hyperlipidemia   . Thyroid disease   . Shortness of breath   . Chronic kidney disease     lt kidney limited fxn  . Seizures     once  . Headache   . Dysrhythmia     Past Surgical History  Procedure Date  . Hiatel hernia repair   . Tubal ligation     Family History  Problem Relation Age of Onset  . Heart disease Mother   . Stroke Mother   . Prostate cancer Father   . Cancer Father     bone  . Liver cancer Brother   . Cancer Brother     leukemia  . Alzheimer's disease      family history  . Cancer Daughter     breast  . Cancer Paternal Uncle     unsure of what kind    History  Substance Use Topics  . Smoking status: Former Games developer  . Smokeless tobacco: Not on file   Comment: quit in 1996  . Alcohol Use: No    OB History    Grav Para Term Preterm Abortions TAB SAB Ect Mult Living                  Review of Systems  Musculoskeletal:       Shoulder pain  Neurological: Negative for weakness and numbness.    Allergies  Procaine hcl; Codeine;  Latex; and Penicillins  Home Medications   Current Outpatient Rx  Name Route Sig Dispense Refill  . ALBUTEROL SULFATE HFA 108 (90 BASE) MCG/ACT IN AERS Inhalation Inhale 2 puffs into the lungs every 6 (six) hours as needed.    Marland Kitchen AMITRIPTYLINE HCL 25 MG PO TABS Oral Take 25 mg by mouth at bedtime.      . CHOLINE FENOFIBRATE 135 MG PO CPDR Oral Take 135 mg by mouth every morning.     Marland Kitchen DIAZEPAM 10 MG PO TABS Oral Take 10 mg by mouth 3 (three) times daily.     Marland Kitchen ESCITALOPRAM OXALATE 20 MG PO TABS Oral Take 20 mg by mouth every morning.     Marland Kitchen OMEGA-3 FATTY ACIDS 1000 MG PO CAPS Oral Take 1 g by mouth every morning.     . FUROSEMIDE 40 MG PO TABS Oral Take 20 mg by mouth daily.     Marland Kitchen GABAPENTIN 300 MG PO CAPS Oral Take 300 mg by mouth at bedtime and  may repeat dose one time if needed.    Marland Kitchen GLIMEPIRIDE 1 MG PO TABS Oral Take 1 mg by mouth daily before breakfast.    . IPRATROPIUM-ALBUTEROL 0.5-2.5 (3) MG/3ML IN SOLN Nebulization Take 3 mLs by nebulization every 4 (four) hours as needed. For shortness of breath     . ISOSORBIDE MONONITRATE ER 30 MG PO TB24 Oral Take 1 tablet (30 mg total) by mouth daily. 30 tablet 11  . LEVOTHYROXINE SODIUM 75 MCG PO TABS Oral Take 75 mcg by mouth every morning.     Marland Kitchen LINAGLIPTIN 5 MG PO TABS Oral Take 5 mg by mouth daily.    Marland Kitchen METOPROLOL SUCCINATE ER 25 MG PO TB24 Oral Take 25 mg by mouth every morning.     . ADULT MULTIVITAMIN W/MINERALS CH Oral Take 1 tablet by mouth every morning.    Marland Kitchen OVER THE COUNTER MEDICATION Oral Take 1 capsule by mouth 3 (three) times daily. Vinegar capsule    . PANTOPRAZOLE SODIUM 40 MG PO TBEC Oral Take 1 tablet (40 mg total) by mouth daily at 6 (six) AM. 30 tablet 5  . POTASSIUM CHLORIDE CRYS ER 20 MEQ PO TBCR Oral Take 10 mEq by mouth daily.     Marland Kitchen SIMVASTATIN 40 MG PO TABS Oral Take 40 mg by mouth at bedtime.     . TRAMADOL HCL 50 MG PO TABS Oral Take 50 mg by mouth 3 (three) times daily. For pain     . WARFARIN SODIUM 5 MG PO TABS  Oral Take 1 tablet (5 mg total) by mouth one time only at 6 PM. 30 tablet 5    BP 121/43  Pulse 61  Temp 98.7 F (37.1 C)  Resp 18  Ht 5\' 2"  (1.575 m)  Wt 164 lb (74.39 kg)  BMI 30.00 kg/m2  SpO2 97%  Physical Exam  Nursing note and vitals reviewed. Constitutional: She is oriented to person, place, and time. She appears well-developed and well-nourished.  HENT:  Head: Normocephalic and atraumatic.  Eyes: EOM are normal. Pupils are equal, round, and reactive to light.  Neck: Normal range of motion. Neck supple.  Cardiovascular: Normal rate, normal heart sounds and intact distal pulses.   Pulmonary/Chest: Effort normal and breath sounds normal.  Abdominal: Bowel sounds are normal. She exhibits no distension. There is no tenderness.  Musculoskeletal: She exhibits tenderness. She exhibits no edema.       Tender to palpation to L shoulder, ROM limited by pain, no deformity  Neurological: She is alert and oriented to person, place, and time. She has normal strength. No cranial nerve deficit or sensory deficit.  Skin: Skin is warm and dry. No rash noted.  Psychiatric: She has a normal mood and affect.    ED Course  Procedures (including critical care time)  Labs Reviewed - No data to display Dg Shoulder Left  11/10/2011  *RADIOLOGY REPORT*  Clinical Data: 66 year old female with pain, blunt trauma.  LEFT SHOULDER - 2+ VIEW  Comparison: 09/23/2009.  Findings: No glenohumeral joint dislocation.  Left clavicle, proximal left humerus and scapula appear intact.  Chronic dystrophic ossification may be associated with the rotator, and has increased in size since 2011.  Negative visualized left ribs and lung parenchyma.  IMPRESSION: No acute osseous abnormality identified about the No acute fracture or dislocation identified about the left shoulder.  Chronic probably degenerative dystrophic calcification, increased since 2011.  Original Report Authenticated By: Harley Hallmark, M.D.     No  diagnosis  found.    MDM  Xray as above. No acute bony abnormality. Will d/c with sling, pain meds and PCP followup.         Jasara Corrigan B. Bernette Mayers, MD 11/10/11 1500

## 2011-11-25 ENCOUNTER — Ambulatory Visit (HOSPITAL_COMMUNITY): Admission: RE | Admit: 2011-11-25 | Payer: Medicaid Other | Source: Ambulatory Visit

## 2011-11-27 ENCOUNTER — Ambulatory Visit (HOSPITAL_COMMUNITY)
Admission: RE | Admit: 2011-11-27 | Discharge: 2011-11-27 | Disposition: A | Discharge status: Home / Self Care | Payer: Medicaid Other | Source: Ambulatory Visit | Attending: Cardiovascular Disease | Admitting: Cardiovascular Disease

## 2011-11-27 DIAGNOSIS — Z09 Encounter for follow-up examination after completed treatment for conditions other than malignant neoplasm: Secondary | ICD-10-CM | POA: Insufficient documentation

## 2011-11-27 DIAGNOSIS — I2699 Other pulmonary embolism without acute cor pulmonale: Secondary | ICD-10-CM

## 2011-11-27 MED ORDER — IOHEXOL 350 MG/ML SOLN
100.0000 mL | Freq: Once | INTRAVENOUS | Status: AC | PRN
  Administered 2011-11-27: 100 mL via INTRAVENOUS

## 2011-11-27 MED ORDER — IOHEXOL 300 MG/ML  SOLN
100.0000 mL | Freq: Once | INTRAMUSCULAR | Status: AC | PRN
  Administered 2011-11-27: 100 mL via INTRAVENOUS

## 2012-01-04 ENCOUNTER — Emergency Department (HOSPITAL_COMMUNITY): Payer: Medicaid Other

## 2012-01-04 ENCOUNTER — Emergency Department (HOSPITAL_COMMUNITY)
Admission: EM | Admit: 2012-01-04 | Discharge: 2012-01-04 | Disposition: A | Discharge status: Home / Self Care | Payer: Medicaid Other | Attending: Emergency Medicine | Admitting: Emergency Medicine

## 2012-01-04 ENCOUNTER — Encounter (HOSPITAL_COMMUNITY): Payer: Self-pay

## 2012-01-04 DIAGNOSIS — F3289 Other specified depressive episodes: Secondary | ICD-10-CM | POA: Insufficient documentation

## 2012-01-04 DIAGNOSIS — E119 Type 2 diabetes mellitus without complications: Secondary | ICD-10-CM | POA: Insufficient documentation

## 2012-01-04 DIAGNOSIS — R079 Chest pain, unspecified: Secondary | ICD-10-CM | POA: Insufficient documentation

## 2012-01-04 DIAGNOSIS — R001 Bradycardia, unspecified: Secondary | ICD-10-CM

## 2012-01-04 DIAGNOSIS — I509 Heart failure, unspecified: Secondary | ICD-10-CM | POA: Insufficient documentation

## 2012-01-04 DIAGNOSIS — Z880 Allergy status to penicillin: Secondary | ICD-10-CM | POA: Insufficient documentation

## 2012-01-04 DIAGNOSIS — IMO0002 Reserved for concepts with insufficient information to code with codable children: Secondary | ICD-10-CM | POA: Insufficient documentation

## 2012-01-04 DIAGNOSIS — Z86711 Personal history of pulmonary embolism: Secondary | ICD-10-CM | POA: Insufficient documentation

## 2012-01-04 DIAGNOSIS — J449 Chronic obstructive pulmonary disease, unspecified: Secondary | ICD-10-CM | POA: Insufficient documentation

## 2012-01-04 DIAGNOSIS — R0602 Shortness of breath: Secondary | ICD-10-CM | POA: Insufficient documentation

## 2012-01-04 DIAGNOSIS — I10 Essential (primary) hypertension: Secondary | ICD-10-CM | POA: Insufficient documentation

## 2012-01-04 DIAGNOSIS — J4489 Other specified chronic obstructive pulmonary disease: Secondary | ICD-10-CM | POA: Insufficient documentation

## 2012-01-04 DIAGNOSIS — F329 Major depressive disorder, single episode, unspecified: Secondary | ICD-10-CM | POA: Insufficient documentation

## 2012-01-04 DIAGNOSIS — K219 Gastro-esophageal reflux disease without esophagitis: Secondary | ICD-10-CM | POA: Insufficient documentation

## 2012-01-04 DIAGNOSIS — Z87891 Personal history of nicotine dependence: Secondary | ICD-10-CM | POA: Insufficient documentation

## 2012-01-04 DIAGNOSIS — E785 Hyperlipidemia, unspecified: Secondary | ICD-10-CM | POA: Insufficient documentation

## 2012-01-04 LAB — BASIC METABOLIC PANEL
Chloride: 101 mEq/L (ref 96–112)
Creatinine, Ser: 1.06 mg/dL (ref 0.50–1.10)
GFR calc Af Amer: 63 mL/min — ABNORMAL LOW (ref >90)
Potassium: 3.8 mEq/L (ref 3.5–5.1)
Sodium: 139 mEq/L (ref 135–145)

## 2012-01-04 LAB — CBC WITH DIFFERENTIAL/PLATELET
Basophils Absolute: 0.1 10*3/uL (ref 0.0–0.1)
Basophils Relative: 2 % — ABNORMAL HIGH (ref 0–1)
MCHC: 31.7 g/dL (ref 30.0–36.0)
Monocytes Absolute: 0.4 10*3/uL (ref 0.1–1.0)
Neutro Abs: 1.4 10*3/uL — ABNORMAL LOW (ref 1.7–7.7)
Neutrophils Relative %: 43 % (ref 43–77)
Platelets: 206 10*3/uL (ref 150–400)
RDW: 15.6 % — ABNORMAL HIGH (ref 11.5–15.5)
WBC: 3.3 10*3/uL — ABNORMAL LOW (ref 4.0–10.5)

## 2012-01-04 NOTE — ED Notes (Signed)
Assisted pt to bedside commode.

## 2012-01-04 NOTE — ED Notes (Signed)
Pt reports pain under both breasts, SOB, and pain between shoulder blades since yesterday.  Reports history of PE.

## 2012-01-04 NOTE — ED Provider Notes (Signed)
History    This chart was scribed for Donnetta Hutching, MD, MD by Smitty Pluck. The patient was seen in room APA01 and the patient's care was started at 12:23PM.   CSN: [id number]  Arrival date & time 01/04/12  1138   First MD Initiated Contact with Patient 01/04/12 1159      Chief Complaint  Patient presents with  . Chest Pain  . Back Pain  . Shortness of Breath    (Consider location/radiation/quality/duration/timing/severity/associated sxs/prior treatment) Patient is a 66 y.o. female presenting with chest pain, back pain, and shortness of breath. The history is provided by the patient.  Chest Pain Primary symptoms include shortness of breath.    Back Pain  Associated symptoms include chest pain.  Shortness of Breath  Associated symptoms include chest pain and shortness of breath.   [name] is a 66 y.o. female who presents to the Emergency Department complaining of moderate left lower chest pain radiating to back onset 1 day ago. She reports not being able to get out of bed due to pain. Pain is aching pain. Pt reports having SOB with exertion. Pt has had similar symptoms in the past and was told she had blood clots in her lung (admitted to hospital 3 months ago). Pt reports having cardiomegaly and heart murmur. Pt reports taking coumadin daily.    PCP is at [facility] in [place]  Cardiologist is Dr. Eli Phillips   Past Medical History  Diagnosis Date  . Hypertension   . Diabetes mellitus   . Arthritis   . Blood transfusion   . Hx pulmonary embolism   . Anemia   . COPD (chronic obstructive pulmonary disease)   . Depression   . Anxiety   . Gastroesophageal reflux disease   . Osteoporosis   . DDD (degenerative disc disease)   . Uterine prolapse   . Uterine prolapse   . Enlarged heart   . Asthma   . CHF (congestive heart failure)   . Emphysema of lung   . Heart murmur   . Hyperlipidemia   . Thyroid disease   . Shortness of breath   . Chronic kidney  disease     lt kidney limited fxn  . Seizures     once  . Headache   . Dysrhythmia     Past Surgical History  Procedure Date  . Hiatel hernia repair   . Tubal ligation     Family History  Problem Relation Age of Onset  . Heart disease Mother   . Stroke Mother   . Prostate cancer Father   . Cancer Father     bone  . Liver cancer Brother   . Cancer Brother     leukemia  . Alzheimer's disease      family history  . Cancer Daughter     breast  . Cancer Paternal Uncle     unsure of what kind    History  Substance Use Topics  . Smoking status: Former Games developer  . Smokeless tobacco: Not on file   Comment: quit in 1996  . Alcohol Use: No    OB History    Grav Para Term Preterm Abortions TAB SAB Ect Mult Living                  Review of Systems  Respiratory: Positive for shortness of breath.   Cardiovascular: Positive for chest pain.  Musculoskeletal: Positive for back pain.  All other systems reviewed and are negative.  10 Systems reviewed and all are negative for acute change except as noted in the HPI.    Allergies  Procaine hcl; Codeine; Latex; and Penicillins  Home Medications   Current Outpatient Rx  Name Route Sig Dispense Refill  . ALBUTEROL SULFATE HFA 108 (90 BASE) MCG/ACT IN AERS Inhalation Inhale 2 puffs into the lungs every 6 (six) hours as needed.    Marland Kitchen AMITRIPTYLINE HCL 25 MG PO TABS Oral Take 25 mg by mouth at bedtime.      . CHOLINE FENOFIBRATE 135 MG PO CPDR Oral Take 135 mg by mouth every morning.     Marland Kitchen DIAZEPAM 10 MG PO TABS Oral Take 10 mg by mouth 3 (three) times daily.     Marland Kitchen ESCITALOPRAM OXALATE 20 MG PO TABS Oral Take 20 mg by mouth every morning.     Marland Kitchen OMEGA-3 FATTY ACIDS 1000 MG PO CAPS Oral Take 1 g by mouth every morning.     . FUROSEMIDE 40 MG PO TABS Oral Take 20 mg by mouth daily.     Marland Kitchen GABAPENTIN 300 MG PO CAPS Oral Take 300 mg by mouth at bedtime and may repeat dose one time if needed.    Marland Kitchen GLIMEPIRIDE 1 MG PO TABS Oral Take  1 mg by mouth daily before breakfast.    . IPRATROPIUM-ALBUTEROL 0.5-2.5 (3) MG/3ML IN SOLN Nebulization Take 3 mLs by nebulization every 4 (four) hours as needed. For shortness of breath     . ISOSORBIDE MONONITRATE ER 30 MG PO TB24 Oral Take 1 tablet (30 mg total) by mouth daily. 30 tablet 11  . LEVOTHYROXINE SODIUM 75 MCG PO TABS Oral Take 75 mcg by mouth every morning.     Marland Kitchen LINAGLIPTIN 5 MG PO TABS Oral Take 5 mg by mouth daily.    Marland Kitchen METOPROLOL SUCCINATE ER 25 MG PO TB24 Oral Take 25 mg by mouth every morning.     . ADULT MULTIVITAMIN W/MINERALS CH Oral Take 1 tablet by mouth every morning.    Marland Kitchen OVER THE COUNTER MEDICATION Oral Take 1 capsule by mouth 3 (three) times daily. Vinegar capsule    . PANTOPRAZOLE SODIUM 40 MG PO TBEC Oral Take 1 tablet (40 mg total) by mouth daily at 6 (six) AM. 30 tablet 5  . POTASSIUM CHLORIDE CRYS ER 20 MEQ PO TBCR Oral Take 10 mEq by mouth daily.     Marland Kitchen SIMVASTATIN 40 MG PO TABS Oral Take 40 mg by mouth at bedtime.     . TRAMADOL HCL 50 MG PO TABS Oral Take 50 mg by mouth 3 (three) times daily. For pain     . WARFARIN SODIUM 5 MG PO TABS Oral Take 1 tablet (5 mg total) by mouth one time only at 6 PM. 30 tablet 5    BP 130/60  Pulse 49  Temp 97.6 F (36.4 C) (Oral)  Resp 20  Ht 5\' 2"  (1.575 m)  Wt 166 lb (75.297 kg)  BMI 30.36 kg/m2  SpO2 97%  Physical Exam  Nursing note and vitals reviewed. Constitutional: She is oriented to person, place, and time. She appears well-developed and well-nourished. No distress.  HENT:  Head: Normocephalic and atraumatic.  Eyes: Conjunctivae and EOM are normal. Pupils are equal, round, and reactive to light.  Neck: Normal range of motion. Neck supple.  Cardiovascular: Regular rhythm.  Bradycardia present.        Pulse of 49  Pulmonary/Chest: Effort normal and breath sounds normal.  Abdominal: Soft. Bowel sounds  are normal.  Musculoskeletal: Normal range of motion.  Neurological: She is alert and oriented to  person, place, and time.  Skin: Skin is warm and dry.  Psychiatric: She has a normal mood and affect.    ED Course  Procedures (including critical care time) DIAGNOSTIC STUDIES: Oxygen Saturation is 97% on room air, normal by my interpretation.    COORDINATION OF CARE: 12:27PM EDP discusses pt ED treatment with pt     Labs Reviewed  CBC WITH DIFFERENTIAL - Abnormal; Notable for the following:    WBC 3.3 (*)     RDW 15.6 (*)     Neutro Abs 1.4 (*)     Monocytes Relative 13 (*)     Basophils Relative 2 (*)     All other components within normal limits  BASIC METABOLIC PANEL - Abnormal; Notable for the following:    Glucose, Bld 100 (*)     GFR calc non Af Amer 55 (*)     GFR calc Af Amer 63 (*)     All other components within normal limits  PROTIME-INR - Abnormal; Notable for the following:    Prothrombin Time 28.8 (*)     INR 2.66 (*)     All other components within normal limits  TROPONIN I  D-DIMER, QUANTITATIVE   Dg Chest Portable 1 View  01/04/2012  *RADIOLOGY REPORT*  Clinical Data: Shortness of breath.  Chest pain.  Back pain.  PORTABLE CHEST - 1 VIEW  Comparison: 08/14/2011  Findings: Mild scarring again seen in the left lower lung. Calcified granuloma noted in the right perihilar region.  Heart size is stable and within normal limits.  No evidence of acute infiltrate or pleural effusion.  IMPRESSION: No acute findings.  Original Report Authenticated By: Danae Orleans, M.D.     No diagnosis found.   Date: 01/04/2012  Rate: 46 Rhythm: sinus bradycardia  QRS Axis: normal  Intervals: normal  ST/T Wave abnormalities: normal  Conduction Disutrbances:none  Narrative Interpretation:   Old EKG Reviewed: changes noted Husband reports a pulse of 45-48 many times in the past  MDM  I have seen this patient before. She appears to be her normal.  Screening chest x-ray EKG troponin and d-dimer negative.  Husband reports normal pulse between 45 and 50.  I personally  performed the services described in this documentation, which was scribed in my presence. The recorded information has been reviewed and considered.        Donnetta Hutching, MD 01/04/12 [phone]

## 2012-01-18 ENCOUNTER — Emergency Department (HOSPITAL_COMMUNITY): Payer: Medicaid Other

## 2012-01-18 ENCOUNTER — Emergency Department (HOSPITAL_COMMUNITY)
Admission: EM | Admit: 2012-01-18 | Discharge: 2012-01-18 | Disposition: A | Discharge status: Home / Self Care | Payer: Medicaid Other | Attending: Emergency Medicine | Admitting: Emergency Medicine

## 2012-01-18 ENCOUNTER — Encounter (HOSPITAL_COMMUNITY): Payer: Self-pay | Admitting: *Deleted

## 2012-01-18 DIAGNOSIS — R110 Nausea: Secondary | ICD-10-CM | POA: Insufficient documentation

## 2012-01-18 DIAGNOSIS — Z7901 Long term (current) use of anticoagulants: Secondary | ICD-10-CM | POA: Insufficient documentation

## 2012-01-18 DIAGNOSIS — N189 Chronic kidney disease, unspecified: Secondary | ICD-10-CM | POA: Insufficient documentation

## 2012-01-18 DIAGNOSIS — E119 Type 2 diabetes mellitus without complications: Secondary | ICD-10-CM | POA: Insufficient documentation

## 2012-01-18 DIAGNOSIS — Z8739 Personal history of other diseases of the musculoskeletal system and connective tissue: Secondary | ICD-10-CM | POA: Insufficient documentation

## 2012-01-18 DIAGNOSIS — R109 Unspecified abdominal pain: Secondary | ICD-10-CM | POA: Insufficient documentation

## 2012-01-18 DIAGNOSIS — F341 Dysthymic disorder: Secondary | ICD-10-CM | POA: Insufficient documentation

## 2012-01-18 DIAGNOSIS — I129 Hypertensive chronic kidney disease with stage 1 through stage 4 chronic kidney disease, or unspecified chronic kidney disease: Secondary | ICD-10-CM | POA: Insufficient documentation

## 2012-01-18 DIAGNOSIS — Z79899 Other long term (current) drug therapy: Secondary | ICD-10-CM | POA: Insufficient documentation

## 2012-01-18 DIAGNOSIS — J4489 Other specified chronic obstructive pulmonary disease: Secondary | ICD-10-CM | POA: Insufficient documentation

## 2012-01-18 DIAGNOSIS — J449 Chronic obstructive pulmonary disease, unspecified: Secondary | ICD-10-CM | POA: Insufficient documentation

## 2012-01-18 DIAGNOSIS — I509 Heart failure, unspecified: Secondary | ICD-10-CM | POA: Insufficient documentation

## 2012-01-18 LAB — URINALYSIS, ROUTINE W REFLEX MICROSCOPIC
Glucose, UA: NEGATIVE mg/dL
Ketones, ur: NEGATIVE mg/dL
Leukocytes, UA: NEGATIVE
Nitrite: NEGATIVE
Protein, ur: NEGATIVE mg/dL

## 2012-01-18 LAB — CBC WITH DIFFERENTIAL/PLATELET
Basophils Relative: 1 % (ref 0–1)
Eosinophils Absolute: 0.1 10*3/uL (ref 0.0–0.7)
HCT: 39.8 % (ref 36.0–46.0)
Hemoglobin: 13 g/dL (ref 12.0–15.0)
Lymphs Abs: 1.2 10*3/uL (ref 0.7–4.0)
MCH: 28.8 pg (ref 26.0–34.0)
MCHC: 32.7 g/dL (ref 30.0–36.0)
Monocytes Absolute: 0.6 10*3/uL (ref 0.1–1.0)
Monocytes Relative: 10 % (ref 3–12)

## 2012-01-18 LAB — COMPREHENSIVE METABOLIC PANEL
Albumin: 3.8 g/dL (ref 3.5–5.2)
BUN: 31 mg/dL — ABNORMAL HIGH (ref 6–23)
Chloride: 98 mEq/L (ref 96–112)
Creatinine, Ser: 1.25 mg/dL — ABNORMAL HIGH (ref 0.50–1.10)
GFR calc Af Amer: 52 mL/min — ABNORMAL LOW (ref >90)
Glucose, Bld: 107 mg/dL — ABNORMAL HIGH (ref 70–99)
Total Bilirubin: 0.4 mg/dL (ref 0.3–1.2)
Total Protein: 7.3 g/dL (ref 6.0–8.3)

## 2012-01-18 LAB — PROTIME-INR: Prothrombin Time: 26 seconds — ABNORMAL HIGH (ref 11.6–15.2)

## 2012-01-18 MED ORDER — MAGNESIUM CITRATE PO SOLN
1.0000 | Freq: Once | ORAL | Status: AC
  Administered 2012-01-18: 1 via ORAL
  Filled 2012-01-18: qty 296

## 2012-01-18 MED ORDER — SODIUM CHLORIDE 0.9 % IV SOLN
Freq: Once | INTRAVENOUS | Status: AC
  Administered 2012-01-18: 17:00:00 via INTRAVENOUS

## 2012-01-18 MED ORDER — HYDROMORPHONE HCL PF 1 MG/ML IJ SOLN
1.0000 mg | Freq: Once | INTRAMUSCULAR | Status: AC
  Administered 2012-01-18: 1 mg via INTRAVENOUS
  Filled 2012-01-18: qty 1

## 2012-01-18 MED ORDER — IOHEXOL 300 MG/ML  SOLN
50.0000 mL | Freq: Once | INTRAMUSCULAR | Status: AC | PRN
  Administered 2012-01-18: 50 mL via INTRAVENOUS

## 2012-01-18 MED ORDER — ONDANSETRON HCL 4 MG/2ML IJ SOLN
4.0000 mg | Freq: Once | INTRAMUSCULAR | Status: AC
  Administered 2012-01-18: 4 mg via INTRAVENOUS
  Filled 2012-01-18: qty 2

## 2012-01-18 NOTE — ED Notes (Signed)
Pt also reports falling yesterday

## 2012-01-18 NOTE — ED Provider Notes (Signed)
History     CSN: [id number]  Arrival date & time 01/18/12  1412   First MD Initiated Contact with Patient 01/18/12 [phone]      Chief Complaint  Patient presents with  . Abdominal Pain  . Flank Pain  . Nausea    (Consider location/radiation/quality/duration/timing/severity/associated sxs/prior treatment) Patient is a 66 y.o. female presenting with abdominal pain and flank pain. The history is provided by the patient.  Abdominal Pain The primary symptoms of the illness include abdominal pain.  Flank Pain Associated symptoms include abdominal pain.  She started having right lower abdominal pain yesterday with some radiation to the flank. Pain is getting worse. She currently rates the pain is severe and rated at 10/10. There is associated nausea but no vomiting. She denies constipation or diarrhea, but has not had a bowel movement or passed any flatus since her pain started. She denies any urinary difficulty. She denies fever or chills. Pain is worse with palpation, nothing makes it better. She has not had pain like this before. She has had abdominal surgery of hiatal hernia repair and tubal ligation, but she thinks she still has her appendix.  Past Medical History  Diagnosis Date  . Hypertension   . Diabetes mellitus   . Arthritis   . Blood transfusion   . Hx pulmonary embolism   . Anemia   . COPD (chronic obstructive pulmonary disease)   . Depression   . Anxiety   . Gastroesophageal reflux disease   . Osteoporosis   . DDD (degenerative disc disease)   . Uterine prolapse   . Uterine prolapse   . Enlarged heart   . Asthma   . CHF (congestive heart failure)   . Emphysema of lung   . Heart murmur   . Hyperlipidemia   . Thyroid disease   . Shortness of breath   . Chronic kidney disease     lt kidney limited fxn  . Seizures     once  . Headache   . Dysrhythmia     Past Surgical History  Procedure Date  . Hiatel hernia repair   . Tubal ligation     Family History    Problem Relation Age of Onset  . Heart disease Mother   . Stroke Mother   . Prostate cancer Father   . Cancer Father     bone  . Liver cancer Brother   . Cancer Brother     leukemia  . Alzheimer's disease      family history  . Cancer Daughter     breast  . Cancer Paternal Uncle     unsure of what kind    History  Substance Use Topics  . Smoking status: Former Games developer  . Smokeless tobacco: Not on file   Comment: quit in 1996  . Alcohol Use: No    OB History    Grav Para Term Preterm Abortions TAB SAB Ect Mult Living                  Review of Systems  Gastrointestinal: Positive for abdominal pain.  Genitourinary: Positive for flank pain.  All other systems reviewed and are negative.    Allergies  Procaine hcl; Codeine; Latex; and Penicillins  Home Medications   Current Outpatient Rx  Name Route Sig Dispense Refill  . ALBUTEROL SULFATE HFA 108 (90 BASE) MCG/ACT IN AERS Inhalation Inhale 2 puffs into the lungs every 6 (six) hours as needed. For shortness of breath    .  AMITRIPTYLINE HCL 25 MG PO TABS Oral Take 25 mg by mouth at bedtime.      . CHOLINE FENOFIBRATE 135 MG PO CPDR Oral Take 135 mg by mouth every morning.     Marland Kitchen DIAZEPAM 10 MG PO TABS Oral Take 10 mg by mouth 3 (three) times daily.     Marland Kitchen ESCITALOPRAM OXALATE 20 MG PO TABS Oral Take 20 mg by mouth every morning.     Marland Kitchen OMEGA-3 FATTY ACIDS 1000 MG PO CAPS Oral Take 1 g by mouth every morning.     Marland Kitchen FA-VITAMIN B-6-VITAMIN B-12 2.2-25-0.5 MG PO TABS Oral Take 1 tablet by mouth daily.    . FUROSEMIDE 40 MG PO TABS Oral Take 40 mg by mouth daily.     Marland Kitchen GABAPENTIN 300 MG PO CAPS Oral Take 300 mg by mouth at bedtime and may repeat dose one time if needed.    Marland Kitchen GLIMEPIRIDE 1 MG PO TABS Oral Take 1 mg by mouth daily before breakfast.    . IPRATROPIUM-ALBUTEROL 0.5-2.5 (3) MG/3ML IN SOLN Nebulization Take 3 mLs by nebulization every 4 (four) hours as needed. For shortness of breath     . ISOSORBIDE MONONITRATE  ER 30 MG PO TB24 Oral Take 1 tablet (30 mg total) by mouth daily. 30 tablet 11  . LEVOTHYROXINE SODIUM 75 MCG PO TABS Oral Take 75 mcg by mouth every morning.     Marland Kitchen LINAGLIPTIN 5 MG PO TABS Oral Take 5 mg by mouth daily.    Marland Kitchen METOPROLOL SUCCINATE ER 25 MG PO TB24 Oral Take 25 mg by mouth every morning.     Marland Kitchen OVER THE COUNTER MEDICATION Oral Take 1 capsule by mouth 3 (three) times daily. Vinegar capsule    . PANTOPRAZOLE SODIUM 40 MG PO TBEC Oral Take 1 tablet (40 mg total) by mouth daily at 6 (six) AM. 30 tablet 5  . POTASSIUM CHLORIDE CRYS ER 20 MEQ PO TBCR Oral Take 10 mEq by mouth daily.     Marland Kitchen SIMVASTATIN 40 MG PO TABS Oral Take 40 mg by mouth at bedtime.     . TRAMADOL HCL 50 MG PO TABS Oral Take 50 mg by mouth 3 (three) times daily. For pain     . WARFARIN SODIUM 5 MG PO TABS Oral Take 2.5-5 mg by mouth daily. Take half (2.5 mg) a tablet on dates august 8th, 9th and 10th. Then start taking 1 tablet (5mg ) everyday until next appointment.      BP 134/46  Pulse 68  Temp 98.3 F (36.8 C) (Oral)  Resp 20  Ht 5\' 2"  (1.575 m)  Wt 170 lb (77.111 kg)  BMI 31.09 kg/m2  SpO2 96%  Physical Exam  Nursing note and vitals reviewed. 66year old female, resting comfortably and in no acute distress. Vital signs are normal. Oxygen saturation is 96%, which is normal. Head is normocephalic and atraumatic. PERRLA, EOMI. Oropharynx is clear. Neck is nontender and supple without adenopathy or JVD. Back has no midline tenderness. Lungs are clear without rales, wheezes, or rhonchi. Chest is nontender. Heart has regular rate and rhythm without murmur. Abdomen is soft, some slightly distended without masses or hepatosplenomegaly. There is mild right upper quadrant and moderate right lower quadrant tenderness without rebound or guarding, and peristalsis is hypoactive. There is mild right CVA tenderness. Extremities have no cyanosis or edema, full range of motion is present. Skin is warm and dry without  rash. Neurologic: Mental status is normal, cranial nerves are intact,  there are no motor or sensory deficits.   ED Course  Procedures (including critical care time)  Results for orders placed during the hospital encounter of 01/18/12  CBC WITH DIFFERENTIAL      Component Value Range   WBC 5.9  4.0 - 10.5 K/uL   RBC 4.52  3.87 - 5.11 MIL/uL   Hemoglobin 13.0  12.0 - 15.0 g/dL   HCT 09.8  11.9 - 14.7 %   MCV 88.1  78.0 - 100.0 fL   MCH 28.8  26.0 - 34.0 pg   MCHC 32.7  30.0 - 36.0 g/dL   RDW 82.9  56.2 - 13.0 %   Platelets 226  150 - 400 K/uL   Neutrophils Relative 67  43 - 77 %   Neutro Abs 3.9  1.7 - 7.7 K/uL   Lymphocytes Relative 21  12 - 46 %   Lymphs Abs 1.2  0.7 - 4.0 K/uL   Monocytes Relative 10  3 - 12 %   Monocytes Absolute 0.6  0.1 - 1.0 K/uL   Eosinophils Relative 2  0 - 5 %   Eosinophils Absolute 0.1  0.0 - 0.7 K/uL   Basophils Relative 1  0 - 1 %   Basophils Absolute 0.0  0.0 - 0.1 K/uL  COMPREHENSIVE METABOLIC PANEL      Component Value Range   Sodium 137  135 - 145 mEq/L   Potassium 4.1  3.5 - 5.1 mEq/L   Chloride 98  96 - 112 mEq/L   CO2 31  19 - 32 mEq/L   Glucose, Bld 107 (*) 70 - 99 mg/dL   BUN 31 (*) 6 - 23 mg/dL   Creatinine, Ser 8.65 (*) 0.50 - 1.10 mg/dL   Calcium 78.4  8.4 - 69.6 mg/dL   Total Protein 7.3  6.0 - 8.3 g/dL   Albumin 3.8  3.5 - 5.2 g/dL   AST 23  0 - 37 U/L   ALT 20  0 - 35 U/L   Alkaline Phosphatase 39  39 - 117 U/L   Total Bilirubin 0.4  0.3 - 1.2 mg/dL   GFR calc non Af Amer 45 (*) >90 mL/min   GFR calc Af Amer 52 (*) >90 mL/min  URINALYSIS, ROUTINE W REFLEX MICROSCOPIC      Component Value Range   Color, Urine YELLOW  YELLOW   APPearance CLEAR  CLEAR   Specific Gravity, Urine <1.005 (*) 1.005 - 1.030   pH 7.0  5.0 - 8.0   Glucose, UA NEGATIVE  NEGATIVE mg/dL   Hgb urine dipstick NEGATIVE  NEGATIVE   Bilirubin Urine NEGATIVE  NEGATIVE   Ketones, ur NEGATIVE  NEGATIVE mg/dL   Protein, ur NEGATIVE  NEGATIVE mg/dL    Urobilinogen, UA 0.2  0.0 - 1.0 mg/dL   Nitrite NEGATIVE  NEGATIVE   Leukocytes, UA NEGATIVE  NEGATIVE  PROTIME-INR      Component Value Range   Prothrombin Time 26.0 (*) 11.6 - 15.2 seconds   INR 2.34 (*) 0.00 - 1.49   Ct Abdomen Pelvis W Contrast  01/18/2012  *RADIOLOGY REPORT*  Clinical Data: Right lower quadrant abdominal pain.  Right flank pain.  History of mild chronic kidney disease.  CT ABDOMEN AND PELVIS WITH CONTRAST  Technique:  Multidetector CT imaging of the abdomen and pelvis was performed following the standard protocol during bolus administration of intravenous contrast.  Contrast: 50mL OMNIPAQUE IOHEXOL 300 MG/ML. Oral contrast was also administered.  Comparison: No prior abdominopelvic CT.  Findings: Scattered calcified granulomata in the liver and spleen; no significant abnormalities involving either organ.  Normal appearing pancreas, adrenal glands, and kidneys.  Gallbladder unremarkable by CT.  No biliary ductal dilation.  Moderate aorto- iliofemoral atherosclerosis without aneurysm.  No significant lymphadenopathy.  Normal-appearing stomach and small bowel.  Large stool burden throughout normal appearing colon.  Normal appendix in the right upper pelvis.  No ascites.  Urinary bladder unremarkable.  Uterus atrophic consistent with age. No adnexal masses or free pelvic fluid.  Numerous pelvic phleboliths.  Bone window images demonstrate osteopenia, mild lower thoracic spondylosis, and degenerative changes involving the lower lumbar spine.  Calcified granuloma in the inferior right upper lobe, scarring in the lingula, and expected dependent atelectasis posteriorly in both lower lobes.  Heart mildly enlarged with a small pericardial effusion.  IMPRESSION:  1.  No acute abnormalities involving the abdomen or pelvis.  Large stool burden. 2.  Small pericardial effusion. 3.  Old granulomatous disease.  Original Report Authenticated By: Arnell Sieving, M.D.   Dg Chest Portable 1  View  01/04/2012  *RADIOLOGY REPORT*  Clinical Data: Shortness of breath.  Chest pain.  Back pain.  PORTABLE CHEST - 1 VIEW  Comparison: 08/14/2011  Findings: Mild scarring again seen in the left lower lung. Calcified granuloma noted in the right perihilar region.  Heart size is stable and within normal limits.  No evidence of acute infiltrate or pleural effusion.  IMPRESSION: No acute findings.  Original Report Authenticated By: Danae Orleans, M.D.      1. Abdominal pain       MDM  Right lower quadrant pain which could be appendicitis, could be ureteral calculus, possible urinary tract infection, possible diverticulitis. CT scan will be obtained to differentiate these. In the meantime, she was given IV hydration and IV narcotics and IV antiemetics.  CT scan is unremarkable except for large stool burden. She'll be given a dose of magnesium citrate. WBC is normal as his urinalysis. She has mild renal insufficiency which is unchanged from baseline. She'll be discharged with instructions to followup with her family doctor in 2 days, return to emergency department if symptoms worsen.      Dione Booze, MD 01/18/12 1714

## 2012-01-18 NOTE — ED Notes (Signed)
Right flank pain, right lower abd pain, nausea that started yesterday, pt states that she feels like the right side of her stomach is bigger than the left. Last bowel movement was 3 days ago, pt states that she has been constipated during the past week

## 2012-04-09 ENCOUNTER — Emergency Department (HOSPITAL_COMMUNITY): Payer: Medicaid Other

## 2012-04-09 ENCOUNTER — Emergency Department (HOSPITAL_COMMUNITY)
Admission: EM | Admit: 2012-04-09 | Discharge: 2012-04-09 | Disposition: A | Discharge status: Home / Self Care | Payer: Medicaid Other | Attending: Emergency Medicine | Admitting: Emergency Medicine

## 2012-04-09 DIAGNOSIS — Z79899 Other long term (current) drug therapy: Secondary | ICD-10-CM | POA: Insufficient documentation

## 2012-04-09 DIAGNOSIS — F329 Major depressive disorder, single episode, unspecified: Secondary | ICD-10-CM | POA: Insufficient documentation

## 2012-04-09 DIAGNOSIS — Z8679 Personal history of other diseases of the circulatory system: Secondary | ICD-10-CM | POA: Insufficient documentation

## 2012-04-09 DIAGNOSIS — Z7901 Long term (current) use of anticoagulants: Secondary | ICD-10-CM | POA: Insufficient documentation

## 2012-04-09 DIAGNOSIS — E119 Type 2 diabetes mellitus without complications: Secondary | ICD-10-CM | POA: Insufficient documentation

## 2012-04-09 DIAGNOSIS — J449 Chronic obstructive pulmonary disease, unspecified: Secondary | ICD-10-CM | POA: Insufficient documentation

## 2012-04-09 DIAGNOSIS — Z9981 Dependence on supplemental oxygen: Secondary | ICD-10-CM | POA: Insufficient documentation

## 2012-04-09 DIAGNOSIS — N189 Chronic kidney disease, unspecified: Secondary | ICD-10-CM | POA: Insufficient documentation

## 2012-04-09 DIAGNOSIS — I129 Hypertensive chronic kidney disease with stage 1 through stage 4 chronic kidney disease, or unspecified chronic kidney disease: Secondary | ICD-10-CM | POA: Insufficient documentation

## 2012-04-09 DIAGNOSIS — Z8709 Personal history of other diseases of the respiratory system: Secondary | ICD-10-CM | POA: Insufficient documentation

## 2012-04-09 DIAGNOSIS — M810 Age-related osteoporosis without current pathological fracture: Secondary | ICD-10-CM | POA: Insufficient documentation

## 2012-04-09 DIAGNOSIS — Z86711 Personal history of pulmonary embolism: Secondary | ICD-10-CM | POA: Insufficient documentation

## 2012-04-09 DIAGNOSIS — Z8742 Personal history of other diseases of the female genital tract: Secondary | ICD-10-CM | POA: Insufficient documentation

## 2012-04-09 DIAGNOSIS — F411 Generalized anxiety disorder: Secondary | ICD-10-CM | POA: Insufficient documentation

## 2012-04-09 DIAGNOSIS — J4489 Other specified chronic obstructive pulmonary disease: Secondary | ICD-10-CM | POA: Insufficient documentation

## 2012-04-09 DIAGNOSIS — Z87891 Personal history of nicotine dependence: Secondary | ICD-10-CM | POA: Insufficient documentation

## 2012-04-09 DIAGNOSIS — E079 Disorder of thyroid, unspecified: Secondary | ICD-10-CM | POA: Insufficient documentation

## 2012-04-09 DIAGNOSIS — E785 Hyperlipidemia, unspecified: Secondary | ICD-10-CM | POA: Insufficient documentation

## 2012-04-09 DIAGNOSIS — F3289 Other specified depressive episodes: Secondary | ICD-10-CM | POA: Insufficient documentation

## 2012-04-09 DIAGNOSIS — Z8739 Personal history of other diseases of the musculoskeletal system and connective tissue: Secondary | ICD-10-CM | POA: Insufficient documentation

## 2012-04-09 DIAGNOSIS — R0602 Shortness of breath: Secondary | ICD-10-CM | POA: Insufficient documentation

## 2012-04-09 DIAGNOSIS — I509 Heart failure, unspecified: Secondary | ICD-10-CM | POA: Insufficient documentation

## 2012-04-09 DIAGNOSIS — R569 Unspecified convulsions: Secondary | ICD-10-CM | POA: Insufficient documentation

## 2012-04-09 DIAGNOSIS — IMO0002 Reserved for concepts with insufficient information to code with codable children: Secondary | ICD-10-CM | POA: Insufficient documentation

## 2012-04-09 DIAGNOSIS — K219 Gastro-esophageal reflux disease without esophagitis: Secondary | ICD-10-CM | POA: Insufficient documentation

## 2012-04-09 DIAGNOSIS — Z8669 Personal history of other diseases of the nervous system and sense organs: Secondary | ICD-10-CM | POA: Insufficient documentation

## 2012-04-09 DIAGNOSIS — M549 Dorsalgia, unspecified: Secondary | ICD-10-CM

## 2012-04-09 DIAGNOSIS — D649 Anemia, unspecified: Secondary | ICD-10-CM | POA: Insufficient documentation

## 2012-04-09 LAB — CBC WITH DIFFERENTIAL/PLATELET
Basophils Relative: 1 % (ref 0–1)
Eosinophils Absolute: 0.1 10*3/uL (ref 0.0–0.7)
Eosinophils Relative: 2 % (ref 0–5)
Lymphs Abs: 1.2 10*3/uL (ref 0.7–4.0)
MCH: 29.3 pg (ref 26.0–34.0)
MCHC: 32.8 g/dL (ref 30.0–36.0)
MCV: 89.2 fL (ref 78.0–100.0)
Neutrophils Relative %: 61 % (ref 43–77)
Platelets: 217 10*3/uL (ref 150–400)
RBC: 4.27 MIL/uL (ref 3.87–5.11)
RDW: 14.5 % (ref 11.5–15.5)

## 2012-04-09 LAB — BASIC METABOLIC PANEL
BUN: 21 mg/dL (ref 6–23)
CO2: 32 mEq/L (ref 19–32)
Calcium: 9.6 mg/dL (ref 8.4–10.5)
Creatinine, Ser: 1 mg/dL (ref 0.50–1.10)
GFR calc non Af Amer: 58 mL/min — ABNORMAL LOW (ref >90)
Glucose, Bld: 92 mg/dL (ref 70–99)
Sodium: 139 mEq/L (ref 135–145)

## 2012-04-09 LAB — PRO B NATRIURETIC PEPTIDE: Pro B Natriuretic peptide (BNP): 639.9 pg/mL — ABNORMAL HIGH (ref 0–125)

## 2012-04-09 LAB — PROTIME-INR: Prothrombin Time: 26.6 seconds — ABNORMAL HIGH (ref 11.6–15.2)

## 2012-04-09 MED ORDER — TRAMADOL HCL 50 MG PO TABS: 50.0000 mg | ORAL_TABLET | Freq: Four times a day (QID) | ORAL | Status: DC | PRN

## 2012-04-09 MED ORDER — IOHEXOL 350 MG/ML SOLN
100.0000 mL | Freq: Once | INTRAVENOUS | Status: AC | PRN
  Administered 2012-04-09: 100 mL via INTRAVENOUS

## 2012-04-09 MED ORDER — SODIUM CHLORIDE 0.9 % IV SOLN: INTRAVENOUS | Status: DC

## 2012-04-09 NOTE — ED Notes (Signed)
Pt states she feels like she may have a PE. Hx of same and states she woke up feeling this way at 1000. Tightness to chest and pain to back.

## 2012-04-09 NOTE — ED Provider Notes (Addendum)
History  This chart was scribed for Shelda Jakes, MD by Bennett Scrape. This patient was seen in room APA07/AP[facility]07 and the patient's care was started at 12:07 PM.  CSN: [id number]  Arrival date & time 04/09/12  1152   First MD Initiated Contact with Patient 04/09/12 1207      Chief Complaint  Patient presents with  . Shortness of Breath     Patient is a 66 y.o. female presenting with back pain. The history is provided by the patient. No language interpreter was used.  Back Pain  This is a new problem. The current episode started yesterday. The problem occurs constantly. The problem has been gradually worsening. The pain is associated with no known injury. Pertinent negatives include no chest pain, no fever, no headaches, no abdominal pain and no dysuria.    [name] is a 66 y.o. female who presents to the Emergency Department complaining of gradual onset, gradually worsening, constant diffuse back pain described as tightness that radiates underneath her bilateral breasts with mild associated SOB that started yesterday. She has a h/o PE and states that the pain is similar to prior PEs, but she admits that she is currently on 2.5 mg of Coumadin daily.  She denies abdominal pain, nausea, emesis, diarrhea and CP as associated symptoms. She reports that she uses O2 overnight but did not wear her [place] last night stating that she "felt fine". She has a h/o CKD but denies being on dialysis. She also has a h/o HTN, DM, arthritis, anemia and COPD. She is a former smoker but denies alcohol use.  PCP is Dr. Daphine Deutscher with [facility] ([place]  Past Medical History  Diagnosis Date  . Hypertension   . Diabetes mellitus   . Arthritis   . Blood transfusion   . Hx pulmonary embolism   . Anemia   . COPD (chronic obstructive pulmonary disease)   . Depression   . Anxiety   . Gastroesophageal reflux disease   . Osteoporosis   . DDD (degenerative disc disease)   . Uterine  prolapse   . Uterine prolapse   . Enlarged heart   . Asthma   . CHF (congestive heart failure)   . Emphysema of lung   . Heart murmur   . Hyperlipidemia   . Thyroid disease   . Shortness of breath   . Chronic kidney disease     lt kidney limited fxn  . Seizures     once  . Headache   . Dysrhythmia     Past Surgical History  Procedure Date  . Hiatel hernia repair   . Tubal ligation     Family History  Problem Relation Age of Onset  . Heart disease Mother   . Stroke Mother   . Prostate cancer Father   . Cancer Father     bone  . Liver cancer Brother   . Cancer Brother     leukemia  . Alzheimer's disease      family history  . Cancer Daughter     breast  . Cancer Paternal Uncle     unsure of what kind    History  Substance Use Topics  . Smoking status: Former Games developer  . Smokeless tobacco: Not on file   Comment: quit in 1996  . Alcohol Use: No    No OB history provided.  Review of Systems  Constitutional: Negative for fever and chills.  HENT: Negative for congestion and rhinorrhea.   Eyes:  Negative for visual disturbance.  Respiratory: Positive for shortness of breath. Negative for cough.   Cardiovascular: Negative for chest pain.  Gastrointestinal: Negative for nausea, vomiting, abdominal pain and diarrhea.  Genitourinary: Negative for dysuria and urgency.  Musculoskeletal: Positive for back pain.  Skin: Negative for rash.  Neurological: Negative for seizures and headaches.    Allergies  Procaine hcl; Codeine; Latex; and Penicillins  Home Medications   Current Outpatient Rx  Name Route Sig Dispense Refill  . ALBUTEROL SULFATE HFA 108 (90 BASE) MCG/ACT IN AERS Inhalation Inhale 2 puffs into the lungs every 6 (six) hours as needed. For shortness of breath    . AMITRIPTYLINE HCL 25 MG PO TABS Oral Take 25 mg by mouth at bedtime.      . CHOLINE FENOFIBRATE 135 MG PO CPDR Oral Take 135 mg by mouth every morning.     Marland Kitchen DIAZEPAM 10 MG PO TABS Oral Take  10 mg by mouth 3 (three) times daily.     Marland Kitchen ESCITALOPRAM OXALATE 20 MG PO TABS Oral Take 20 mg by mouth every morning.     Marland Kitchen OMEGA-3 FATTY ACIDS 1000 MG PO CAPS Oral Take 1 g by mouth every morning.     Marland Kitchen FA-VITAMIN B-6-VITAMIN B-12 2.2-25-0.5 MG PO TABS Oral Take 1 tablet by mouth daily.    . FUROSEMIDE 40 MG PO TABS Oral Take 40 mg by mouth daily.     Marland Kitchen GABAPENTIN 300 MG PO CAPS Oral Take 300 mg by mouth at bedtime and may repeat dose one time if needed.    Marland Kitchen GLIMEPIRIDE 1 MG PO TABS Oral Take 1 mg by mouth daily before breakfast.    . IPRATROPIUM-ALBUTEROL 0.5-2.5 (3) MG/3ML IN SOLN Nebulization Take 3 mLs by nebulization every 4 (four) hours as needed. For shortness of breath     . ISOSORBIDE MONONITRATE ER 30 MG PO TB24 Oral Take 1 tablet (30 mg total) by mouth daily. 30 tablet 11  . LEVOTHYROXINE SODIUM 75 MCG PO TABS Oral Take 75 mcg by mouth every morning.     Marland Kitchen LINAGLIPTIN 5 MG PO TABS Oral Take 5 mg by mouth daily.    Marland Kitchen METOPROLOL SUCCINATE ER 25 MG PO TB24 Oral Take 25 mg by mouth every morning.     Marland Kitchen PANTOPRAZOLE SODIUM 40 MG PO TBEC Oral Take 1 tablet (40 mg total) by mouth daily at 6 (six) AM. 30 tablet 5  . POTASSIUM CHLORIDE CRYS ER 20 MEQ PO TBCR Oral Take 10 mEq by mouth daily.     Marland Kitchen SIMVASTATIN 40 MG PO TABS Oral Take 40 mg by mouth at bedtime.     . TRAMADOL HCL 50 MG PO TABS Oral Take 50 mg by mouth 3 (three) times daily. For pain     . WARFARIN SODIUM 5 MG PO TABS Oral Take 2.5 mg by mouth daily.     . TRAMADOL HCL 50 MG PO TABS Oral Take 1 tablet (50 mg total) by mouth every 6 (six) hours as needed for pain. 15 tablet 0  . VITAMIN D (ERGOCALCIFEROL) 50000 UNITS PO CAPS Oral Take 50,000 Units by mouth every 7 (seven) days.      Triage Vitals: BP 130/48  Pulse 44  Temp 97.5 F (36.4 C) (Oral)  Resp 16  Ht 5\' 2"  (1.575 m)  Wt 165 lb (74.844 kg)  BMI 30.18 kg/m2  SpO2 95%  Physical Exam  Nursing note and vitals reviewed. Constitutional: She is oriented to person,  place, and time. She appears well-developed and well-nourished. No distress.  HENT:  Head: Normocephalic and atraumatic.  Mouth/Throat: Oropharynx is clear and moist.  Eyes: Conjunctivae normal and EOM are normal.  Neck: Neck supple. No tracheal deviation present.  Cardiovascular: Normal rate and regular rhythm.   No murmur heard. Pulmonary/Chest: Effort normal. No respiratory distress. She has rales (heard upon auscultation of the back). She exhibits no tenderness.  Abdominal: Soft. Bowel sounds are normal. There is no tenderness.  Musculoskeletal: Normal range of motion. She exhibits no edema and no tenderness.  Neurological: She is alert and oriented to person, place, and time. No cranial nerve deficit.  Skin: Skin is warm and dry.  Psychiatric: She has a normal mood and affect. Her behavior is normal.    ED Course  Procedures (including critical care time)  DIAGNOSTIC STUDIES: Oxygen Saturation is 95% on room air, adequate by my interpretation.    COORDINATION OF CARE: 12:30 PM- Discussed treatment plan which includes CXR and lab work with pt at bedside and pt agreed to plan.    Labs Reviewed  BASIC METABOLIC PANEL - Abnormal; Notable for the following:    GFR calc non Af Amer 58 (*)     GFR calc Af Amer 67 (*)     All other components within normal limits  PRO B NATRIURETIC PEPTIDE - Abnormal; Notable for the following:    Pro B Natriuretic peptide (BNP) 639.9 (*)     All other components within normal limits  PROTIME-INR - Abnormal; Notable for the following:    Prothrombin Time 26.6 (*)     INR 2.60 (*)     All other components within normal limits  TROPONIN I  CBC WITH DIFFERENTIAL   Dg Chest 2 View  04/09/2012  *RADIOLOGY REPORT*  Clinical Data: Shortness of breath.  CHEST - 2 VIEW  Comparison: Chest x-ray 01/04/2012.  Findings: The opacity in the periphery of the left base is unchanged, and most compatible with an area of scarring (this was best demonstrated on  chest CT 08/17/2011).  No acute consolidative airspace disease.  No pleural effusions.  No definite suspicious appearing pulmonary nodules or masses.  Small calcified granuloma in the lateral segment of the right middle lobe is again noted. Heart size is mildly enlarged (unchanged).  The mediastinal contours are unremarkable.  Atherosclerosis in the thoracic aorta.  IMPRESSION: 1.  The radiographic appearance of the chest is unchanged, as detailed above, and there is no definite evidence to suggest acute cardiopulmonary disease on today's examination.   Original Report Authenticated By: Trudie Reed, M.D.    Ct Angio Chest W/cm &/or Wo Cm  04/09/2012  *RADIOLOGY REPORT*  Clinical Data: Shortness of breath, history of prior pulmonary embolism  CT ANGIOGRAPHY CHEST  Technique:  Multidetector CT imaging of the chest using the standard protocol during bolus administration of intravenous contrast. Multiplanar reconstructed images including MIPs were obtained and reviewed to evaluate the vascular anatomy.  Contrast: OMNIPAQUE IOHEXOL 350 MG/ML SOLN  Comparison: CT angio chest of 06/19 and 08/27/2011  Findings: The pulmonary arteries opacify well and there is no evidence of pulmonary embolism.  The thoracic aorta also are faintly opacifies with no acute abnormality noted.  Atheromatous change is noted within the aortic arch and descending thoracic aorta.  Cardiomegaly is stable.  Small mediastinal nodes are stable.  No definite mediastinal or hilar adenopathy is seen.  A calcified splenic granuloma is noted.  On the lung window images, a calcified  granuloma is stable within the right middle lobe consistent with prior granulomatous disease. No parenchymal infiltrate or pleural effusion is seen.  Linear atelectasis or scarring is noted in the left lower lobe and lingula.  No bony abnormality is seen.  IMPRESSION:  1.  No evidence of acute pulmonary embolism. 2.  Changes of prior granulomatous disease. 3.   Cardiomegaly.   Original Report Authenticated By: Dwyane Dee, M.D.      Date: 04/09/2012  Rate: 49  Rhythm: sinus bradycardia  QRS Axis: normal  Intervals: normal  ST/T Wave abnormalities: normal  Conduction Disutrbances:none  Narrative Interpretation:   Old EKG Reviewed: unchanged From 01/04/12   Results for orders placed during the hospital encounter of 04/09/12  TROPONIN I      Component Value Range   Troponin I <0.30  <0.30 ng/mL  BASIC METABOLIC PANEL      Component Value Range   Sodium 139  135 - 145 mEq/L   Potassium 3.8  3.5 - 5.1 mEq/L   Chloride 102  96 - 112 mEq/L   CO2 32  19 - 32 mEq/L   Glucose, Bld 92  70 - 99 mg/dL   BUN 21  6 - 23 mg/dL   Creatinine, Ser 4.78  0.50 - 1.10 mg/dL   Calcium 9.6  8.4 - 29.5 mg/dL   GFR calc non Af Amer 58 (*) >90 mL/min   GFR calc Af Amer 67 (*) >90 mL/min  CBC WITH DIFFERENTIAL      Component Value Range   WBC 4.6  4.0 - 10.5 K/uL   RBC 4.27  3.87 - 5.11 MIL/uL   Hemoglobin 12.5  12.0 - 15.0 g/dL   HCT 62.1  30.8 - 65.7 %   MCV 89.2  78.0 - 100.0 fL   MCH 29.3  26.0 - 34.0 pg   MCHC 32.8  30.0 - 36.0 g/dL   RDW 84.6  96.2 - 95.2 %   Platelets 217  150 - 400 K/uL   Neutrophils Relative 61  43 - 77 %   Neutro Abs 2.8  1.7 - 7.7 K/uL   Lymphocytes Relative 27  12 - 46 %   Lymphs Abs 1.2  0.7 - 4.0 K/uL   Monocytes Relative 10  3 - 12 %   Monocytes Absolute 0.4  0.1 - 1.0 K/uL   Eosinophils Relative 2  0 - 5 %   Eosinophils Absolute 0.1  0.0 - 0.7 K/uL   Basophils Relative 1  0 - 1 %   Basophils Absolute 0.0  0.0 - 0.1 K/uL  PRO B NATRIURETIC PEPTIDE      Component Value Range   Pro B Natriuretic peptide (BNP) 639.9 (*) 0 - 125 pg/mL  PROTIME-INR      Component Value Range   Prothrombin Time 26.6 (*) 11.6 - 15.2 seconds   INR 2.60 (*) 0.00 - 1.49     1. Shortness of breath   2. Back pain       MDM  Chest x-rays and labs without significant findings. INR is therapeutic EKG without acute changes troponin is  negative patient has had the back and lower chest discomfort since yesterday and this was acute cardiac event should be positive by now. Renal function is normal. No leukocytosis no evidence of pneumonia or pneumothorax. CT angios chest is pending to evaluate evidence of a new pulmonary embolism however patient is therapeutic on her Coumadin.  CT angios without any evidence of pulmonary embolism or  other acute findings. Patient can be discharged home.    I personally performed the services described in this documentation, which was scribed in my presence. The recorded information has been reviewed and considered.     Shelda Jakes, MD 04/09/12 1425     Shelda Jakes, MD 04/09/12 [phone]

## 2012-05-20 ENCOUNTER — Ambulatory Visit (HOSPITAL_COMMUNITY)
Admission: RE | Admit: 2012-05-20 | Discharge: 2012-05-20 | Disposition: A | Discharge status: Home / Self Care | Payer: Medicaid Other | Source: Ambulatory Visit | Attending: Cardiovascular Disease | Admitting: Cardiovascular Disease

## 2012-05-20 DIAGNOSIS — I10 Essential (primary) hypertension: Secondary | ICD-10-CM | POA: Insufficient documentation

## 2012-05-20 DIAGNOSIS — E119 Type 2 diabetes mellitus without complications: Secondary | ICD-10-CM | POA: Insufficient documentation

## 2012-05-20 DIAGNOSIS — J4489 Other specified chronic obstructive pulmonary disease: Secondary | ICD-10-CM | POA: Insufficient documentation

## 2012-05-20 DIAGNOSIS — I379 Nonrheumatic pulmonary valve disorder, unspecified: Secondary | ICD-10-CM | POA: Insufficient documentation

## 2012-05-20 DIAGNOSIS — I319 Disease of pericardium, unspecified: Secondary | ICD-10-CM | POA: Insufficient documentation

## 2012-05-20 DIAGNOSIS — I509 Heart failure, unspecified: Secondary | ICD-10-CM | POA: Insufficient documentation

## 2012-05-20 DIAGNOSIS — I080 Rheumatic disorders of both mitral and aortic valves: Secondary | ICD-10-CM | POA: Insufficient documentation

## 2012-05-20 DIAGNOSIS — J449 Chronic obstructive pulmonary disease, unspecified: Secondary | ICD-10-CM | POA: Insufficient documentation

## 2012-05-20 DIAGNOSIS — I2789 Other specified pulmonary heart diseases: Secondary | ICD-10-CM | POA: Insufficient documentation

## 2012-05-20 NOTE — Progress Notes (Signed)
*  PRELIMINARY RESULTS* Echocardiogram 2D Echocardiogram has been performed.  Conrad [place] 05/20/2012, 11:11 AM

## 2012-06-25 ENCOUNTER — Emergency Department (HOSPITAL_COMMUNITY)
Admission: EM | Admit: 2012-06-25 | Discharge: 2012-06-25 | Disposition: A | Discharge status: Home / Self Care | Payer: Medicaid Other | Attending: Emergency Medicine | Admitting: Emergency Medicine

## 2012-06-25 ENCOUNTER — Emergency Department (HOSPITAL_COMMUNITY): Payer: Medicaid Other

## 2012-06-25 ENCOUNTER — Encounter (HOSPITAL_COMMUNITY): Payer: Self-pay | Admitting: *Deleted

## 2012-06-25 DIAGNOSIS — F329 Major depressive disorder, single episode, unspecified: Secondary | ICD-10-CM | POA: Insufficient documentation

## 2012-06-25 DIAGNOSIS — Z87448 Personal history of other diseases of urinary system: Secondary | ICD-10-CM | POA: Insufficient documentation

## 2012-06-25 DIAGNOSIS — Z862 Personal history of diseases of the blood and blood-forming organs and certain disorders involving the immune mechanism: Secondary | ICD-10-CM | POA: Insufficient documentation

## 2012-06-25 DIAGNOSIS — Y939 Activity, unspecified: Secondary | ICD-10-CM | POA: Insufficient documentation

## 2012-06-25 DIAGNOSIS — E119 Type 2 diabetes mellitus without complications: Secondary | ICD-10-CM | POA: Insufficient documentation

## 2012-06-25 DIAGNOSIS — E079 Disorder of thyroid, unspecified: Secondary | ICD-10-CM | POA: Insufficient documentation

## 2012-06-25 DIAGNOSIS — N189 Chronic kidney disease, unspecified: Secondary | ICD-10-CM | POA: Insufficient documentation

## 2012-06-25 DIAGNOSIS — F3289 Other specified depressive episodes: Secondary | ICD-10-CM | POA: Insufficient documentation

## 2012-06-25 DIAGNOSIS — I129 Hypertensive chronic kidney disease with stage 1 through stage 4 chronic kidney disease, or unspecified chronic kidney disease: Secondary | ICD-10-CM | POA: Insufficient documentation

## 2012-06-25 DIAGNOSIS — Z8679 Personal history of other diseases of the circulatory system: Secondary | ICD-10-CM | POA: Insufficient documentation

## 2012-06-25 DIAGNOSIS — J45909 Unspecified asthma, uncomplicated: Secondary | ICD-10-CM | POA: Insufficient documentation

## 2012-06-25 DIAGNOSIS — Z79899 Other long term (current) drug therapy: Secondary | ICD-10-CM | POA: Insufficient documentation

## 2012-06-25 DIAGNOSIS — F411 Generalized anxiety disorder: Secondary | ICD-10-CM | POA: Insufficient documentation

## 2012-06-25 DIAGNOSIS — W19XXXA Unspecified fall, initial encounter: Secondary | ICD-10-CM | POA: Insufficient documentation

## 2012-06-25 DIAGNOSIS — Z872 Personal history of diseases of the skin and subcutaneous tissue: Secondary | ICD-10-CM | POA: Insufficient documentation

## 2012-06-25 DIAGNOSIS — J4489 Other specified chronic obstructive pulmonary disease: Secondary | ICD-10-CM | POA: Insufficient documentation

## 2012-06-25 DIAGNOSIS — I10 Essential (primary) hypertension: Secondary | ICD-10-CM | POA: Insufficient documentation

## 2012-06-25 DIAGNOSIS — S59909A Unspecified injury of unspecified elbow, initial encounter: Secondary | ICD-10-CM | POA: Insufficient documentation

## 2012-06-25 DIAGNOSIS — Z8639 Personal history of other endocrine, nutritional and metabolic disease: Secondary | ICD-10-CM | POA: Insufficient documentation

## 2012-06-25 DIAGNOSIS — Z86711 Personal history of pulmonary embolism: Secondary | ICD-10-CM | POA: Insufficient documentation

## 2012-06-25 DIAGNOSIS — I509 Heart failure, unspecified: Secondary | ICD-10-CM | POA: Insufficient documentation

## 2012-06-25 DIAGNOSIS — Z8709 Personal history of other diseases of the respiratory system: Secondary | ICD-10-CM | POA: Insufficient documentation

## 2012-06-25 DIAGNOSIS — R011 Cardiac murmur, unspecified: Secondary | ICD-10-CM | POA: Insufficient documentation

## 2012-06-25 DIAGNOSIS — Z8739 Personal history of other diseases of the musculoskeletal system and connective tissue: Secondary | ICD-10-CM | POA: Insufficient documentation

## 2012-06-25 DIAGNOSIS — S6990XA Unspecified injury of unspecified wrist, hand and finger(s), initial encounter: Secondary | ICD-10-CM | POA: Insufficient documentation

## 2012-06-25 DIAGNOSIS — Y9289 Other specified places as the place of occurrence of the external cause: Secondary | ICD-10-CM | POA: Insufficient documentation

## 2012-06-25 DIAGNOSIS — K219 Gastro-esophageal reflux disease without esophagitis: Secondary | ICD-10-CM | POA: Insufficient documentation

## 2012-06-25 DIAGNOSIS — Z8669 Personal history of other diseases of the nervous system and sense organs: Secondary | ICD-10-CM | POA: Insufficient documentation

## 2012-06-25 DIAGNOSIS — Z7901 Long term (current) use of anticoagulants: Secondary | ICD-10-CM | POA: Insufficient documentation

## 2012-06-25 DIAGNOSIS — J449 Chronic obstructive pulmonary disease, unspecified: Secondary | ICD-10-CM | POA: Insufficient documentation

## 2012-06-25 LAB — GLUCOSE, CAPILLARY: Glucose-Capillary: 103 mg/dL — ABNORMAL HIGH (ref 70–99)

## 2012-06-25 NOTE — ED Notes (Signed)
Patient transported to X-ray 

## 2012-06-25 NOTE — ED Provider Notes (Signed)
History     CSN: [id number]  Arrival date & time 06/25/12  1547   First MD Initiated Contact with Patient 06/25/12 1705      Chief Complaint  Patient presents with  . Fall    (Consider location/radiation/quality/duration/timing/severity/associated sxs/prior treatment) HPI.    Accidental fall in bathroom.  Complains of pain in right groin and left elbow.  No loss of consciousness or neurological deficits. Patient is impaired and has trouble walking normally.  Nothing makes symptoms better or worse. Severity is mild. No other complaints.  Past Medical History  Diagnosis Date  . Hypertension   . Diabetes mellitus   . Arthritis   . Blood transfusion   . Hx pulmonary embolism   . Anemia   . COPD (chronic obstructive pulmonary disease)   . Depression   . Anxiety   . Gastroesophageal reflux disease   . Osteoporosis   . DDD (degenerative disc disease)   . Uterine prolapse   . Uterine prolapse   . Enlarged heart   . Asthma   . CHF (congestive heart failure)   . Emphysema of lung   . Heart murmur   . Hyperlipidemia   . Thyroid disease   . Shortness of breath   . Chronic kidney disease     lt kidney limited fxn  . Seizures     once  . Headache   . Dysrhythmia     Past Surgical History  Procedure Date  . Hiatel hernia repair   . Tubal ligation     Family History  Problem Relation Age of Onset  . Heart disease Mother   . Stroke Mother   . Prostate cancer Father   . Cancer Father     bone  . Liver cancer Brother   . Cancer Brother     leukemia  . Alzheimer's disease      family history  . Cancer Daughter     breast  . Cancer Paternal Uncle     unsure of what kind    History  Substance Use Topics  . Smoking status: Former Games developer  . Smokeless tobacco: Not on file     Comment: quit in 1996  . Alcohol Use: No    OB History    Grav Para Term Preterm Abortions TAB SAB Ect Mult Living                  Review of Systems  All other systems reviewed and  are negative.    Allergies  Procaine hcl; Codeine; Latex; and Penicillins  Home Medications   Current Outpatient Rx  Name  Route  Sig  Dispense  Refill  . ALBUTEROL SULFATE HFA 108 (90 BASE) MCG/ACT IN AERS   Inhalation   Inhale 2 puffs into the lungs every 6 (six) hours as needed. For shortness of breath         . AMITRIPTYLINE HCL 25 MG PO TABS   Oral   Take 25 mg by mouth at bedtime.           . CHOLINE FENOFIBRATE 135 MG PO CPDR   Oral   Take 135 mg by mouth every morning.          Marland Kitchen DIAZEPAM 10 MG PO TABS   Oral   Take 10 mg by mouth 3 (three) times daily.          Marland Kitchen ESCITALOPRAM OXALATE 20 MG PO TABS   Oral   Take 20 mg by mouth  every morning.          Marland Kitchen OMEGA-3 FATTY ACIDS 1000 MG PO CAPS   Oral   Take 1 g by mouth every morning.          Marland Kitchen FA-VITAMIN B-6-VITAMIN B-12 2.2-25-0.5 MG PO TABS   Oral   Take 1 tablet by mouth daily.         . FUROSEMIDE 40 MG PO TABS   Oral   Take 40 mg by mouth daily.          Marland Kitchen GABAPENTIN 300 MG PO CAPS   Oral   Take 300 mg by mouth at bedtime and may repeat dose one time if needed.         Marland Kitchen GLIMEPIRIDE 1 MG PO TABS   Oral   Take 1 mg by mouth daily before breakfast.         . IPRATROPIUM-ALBUTEROL 0.5-2.5 (3) MG/3ML IN SOLN   Nebulization   Take 3 mLs by nebulization every 4 (four) hours as needed. For shortness of breath          . ISOSORBIDE MONONITRATE ER 30 MG PO TB24   Oral   Take 1 tablet (30 mg total) by mouth daily.   30 tablet   11   . LEVOTHYROXINE SODIUM 75 MCG PO TABS   Oral   Take 75 mcg by mouth every morning.          Marland Kitchen LINAGLIPTIN 5 MG PO TABS   Oral   Take 5 mg by mouth daily.         Marland Kitchen METOPROLOL SUCCINATE ER 25 MG PO TB24   Oral   Take 25 mg by mouth every morning.          Marland Kitchen PANTOPRAZOLE SODIUM 40 MG PO TBEC   Oral   Take 1 tablet (40 mg total) by mouth daily at 6 (six) AM.   30 tablet   5   . POTASSIUM CHLORIDE CRYS ER 20 MEQ PO TBCR   Oral   Take  10 mEq by mouth daily.          Marland Kitchen SIMVASTATIN 40 MG PO TABS   Oral   Take 40 mg by mouth at bedtime.          . TRAMADOL HCL 50 MG PO TABS   Oral   Take 50 mg by mouth 3 (three) times daily. For pain          . VITAMIN D (ERGOCALCIFEROL) 50000 UNITS PO CAPS   Oral   Take 50,000 Units by mouth every 7 (seven) days.         . WARFARIN SODIUM 5 MG PO TABS   Oral   Take 2.5 mg by mouth daily.            BP 122/55  Pulse 62  Temp 98.4 F (36.9 C) (Oral)  Resp 21  Ht 5\' 2"  (1.575 m)  Wt 165 lb (74.844 kg)  BMI 30.18 kg/m2  SpO2 94%  Physical Exam  Nursing note and vitals reviewed. Constitutional: She is oriented to person, place, and time. She appears well-nourished.       Looks older than stated age  HENT:  Head: Normocephalic and atraumatic.  Eyes: Conjunctivae normal and EOM are normal. Pupils are equal, round, and reactive to light.  Neck: Normal range of motion. Neck supple.  Cardiovascular: Normal rate, regular rhythm and normal heart sounds.   Pulmonary/Chest: Effort normal and breath sounds  normal.  Abdominal: Soft. Bowel sounds are normal.  Musculoskeletal: Normal range of motion.       Tender right groin and left elbow  Neurological: She is alert and oriented to person, place, and time.  Skin: Skin is warm and dry.  Psychiatric: She has a normal mood and affect.    ED Course  Procedures (including critical care time)  Labs Reviewed  GLUCOSE, CAPILLARY - Abnormal; Notable for the following:    Glucose-Capillary 103 (*)     All other components within normal limits   Dg Elbow Complete Left  06/25/2012  *RADIOLOGY REPORT*  Clinical Data: Fall, left elbow pain.  LEFT ELBOW - COMPLETE 3+ VIEW  Comparison: None.  Findings: No acute bony abnormality.  Specifically, no fracture, subluxation, or dislocation.  Soft tissues are intact. Joint spaces are maintained.  Normal bone mineralization.  No joint effusion.  IMPRESSION: No acute bony abnormality.    Original Report Authenticated By: Charlett Nose, M.D.    Dg Hip Complete Right  06/25/2012  *RADIOLOGY REPORT*  Clinical Data: Fall, right hip pain.  RIGHT HIP - COMPLETE 2+ VIEW  Comparison: None  Findings: No acute bony abnormality.  Specifically, no fracture, subluxation, or dislocation.  Soft tissues are intact.  SI joints and hip joints are symmetric and unremarkable.  IMPRESSION: No acute bony abnormality.   Original Report Authenticated By: Charlett Nose, M.D.      1. Fall       MDM  Plain films of right hip and left elbow are negative.  No clinical evidence of a stroke.        Donnetta Hutching, MD 06/25/12 [phone]

## 2012-06-25 NOTE — ED Notes (Signed)
Pt was in bathroom when she fell, pt states that she fell because she does not have much use of her body and fell due to weakness, pt states that she has not eaten all day, pt c/o pain left elbow and entire right leg, pt states that she did hit hear head when she fell on the tub, unsure of any loc, states that she was in the floor until someone came home, ?two hours.

## 2012-08-16 ENCOUNTER — Ambulatory Visit: Payer: Self-pay | Admitting: Internal Medicine

## 2012-08-16 DIAGNOSIS — Z7901 Long term (current) use of anticoagulants: Secondary | ICD-10-CM

## 2012-09-09 ENCOUNTER — Other Ambulatory Visit: Payer: Self-pay | Admitting: *Deleted

## 2012-09-09 MED ORDER — LEVOTHYROXINE SODIUM 75 MCG PO TABS: 75.0000 ug | ORAL_TABLET | ORAL | Status: DC

## 2012-09-09 MED ORDER — SIMVASTATIN 40 MG PO TABS: 40.0000 mg | ORAL_TABLET | Freq: Every day | ORAL | Status: DC

## 2012-09-09 MED ORDER — FUROSEMIDE 40 MG PO TABS: 40.0000 mg | ORAL_TABLET | Freq: Every day | ORAL | Status: DC

## 2012-09-09 MED ORDER — GLIMEPIRIDE 1 MG PO TABS: 1.0000 mg | ORAL_TABLET | Freq: Every day | ORAL | Status: DC

## 2012-09-09 MED ORDER — GABAPENTIN 300 MG PO CAPS: 300.0000 mg | ORAL_CAPSULE | Freq: Every evening | ORAL | Status: DC | PRN

## 2012-09-09 NOTE — Telephone Encounter (Signed)
Chart says 2 daily, couldn't reach pt. To clarify

## 2012-09-10 ENCOUNTER — Other Ambulatory Visit: Payer: Self-pay | Admitting: Nurse Practitioner

## 2012-09-10 NOTE — Telephone Encounter (Signed)
Last seen 06/30/12, last filled 08/10/12

## 2012-10-08 ENCOUNTER — Other Ambulatory Visit: Payer: Self-pay | Admitting: Nurse Practitioner

## 2012-10-09 NOTE — Telephone Encounter (Signed)
Last filled 09/12/12, last seen 06/30/12. Call [phone] for pickup

## 2012-10-12 ENCOUNTER — Telehealth: Payer: Self-pay | Admitting: Nurse Practitioner

## 2012-10-12 NOTE — Telephone Encounter (Signed)
error 

## 2012-10-21 ENCOUNTER — Ambulatory Visit: Payer: Medicaid Other

## 2012-10-21 ENCOUNTER — Ambulatory Visit (INDEPENDENT_AMBULATORY_CARE_PROVIDER_SITE_OTHER): Payer: Medicaid Other | Admitting: Pharmacist Clinician (PhC)/ Clinical Pharmacy Specialist

## 2012-10-21 DIAGNOSIS — I2699 Other pulmonary embolism without acute cor pulmonale: Secondary | ICD-10-CM

## 2012-10-21 DIAGNOSIS — Z7901 Long term (current) use of anticoagulants: Secondary | ICD-10-CM

## 2012-10-21 LAB — POCT INR: INR: 2.7

## 2012-11-02 ENCOUNTER — Emergency Department (HOSPITAL_COMMUNITY)
Admission: EM | Admit: 2012-11-02 | Discharge: 2012-11-03 | Disposition: A | Discharge status: Home / Self Care | Payer: Medicaid Other | Attending: Emergency Medicine | Admitting: Emergency Medicine

## 2012-11-02 ENCOUNTER — Emergency Department (HOSPITAL_COMMUNITY): Payer: Medicaid Other

## 2012-11-02 ENCOUNTER — Encounter (HOSPITAL_COMMUNITY): Payer: Self-pay | Admitting: *Deleted

## 2012-11-02 DIAGNOSIS — F329 Major depressive disorder, single episode, unspecified: Secondary | ICD-10-CM | POA: Insufficient documentation

## 2012-11-02 DIAGNOSIS — Z9981 Dependence on supplemental oxygen: Secondary | ICD-10-CM | POA: Insufficient documentation

## 2012-11-02 DIAGNOSIS — Z8709 Personal history of other diseases of the respiratory system: Secondary | ICD-10-CM | POA: Insufficient documentation

## 2012-11-02 DIAGNOSIS — Z79899 Other long term (current) drug therapy: Secondary | ICD-10-CM | POA: Insufficient documentation

## 2012-11-02 DIAGNOSIS — R0602 Shortness of breath: Secondary | ICD-10-CM | POA: Insufficient documentation

## 2012-11-02 DIAGNOSIS — D649 Anemia, unspecified: Secondary | ICD-10-CM | POA: Insufficient documentation

## 2012-11-02 DIAGNOSIS — Z86711 Personal history of pulmonary embolism: Secondary | ICD-10-CM | POA: Insufficient documentation

## 2012-11-02 DIAGNOSIS — Z87448 Personal history of other diseases of urinary system: Secondary | ICD-10-CM | POA: Insufficient documentation

## 2012-11-02 DIAGNOSIS — M129 Arthropathy, unspecified: Secondary | ICD-10-CM | POA: Insufficient documentation

## 2012-11-02 DIAGNOSIS — M546 Pain in thoracic spine: Secondary | ICD-10-CM | POA: Insufficient documentation

## 2012-11-02 DIAGNOSIS — Z8669 Personal history of other diseases of the nervous system and sense organs: Secondary | ICD-10-CM | POA: Insufficient documentation

## 2012-11-02 DIAGNOSIS — F411 Generalized anxiety disorder: Secondary | ICD-10-CM | POA: Insufficient documentation

## 2012-11-02 DIAGNOSIS — R0789 Other chest pain: Secondary | ICD-10-CM | POA: Insufficient documentation

## 2012-11-02 DIAGNOSIS — E079 Disorder of thyroid, unspecified: Secondary | ICD-10-CM | POA: Insufficient documentation

## 2012-11-02 DIAGNOSIS — Z8679 Personal history of other diseases of the circulatory system: Secondary | ICD-10-CM | POA: Insufficient documentation

## 2012-11-02 DIAGNOSIS — F3289 Other specified depressive episodes: Secondary | ICD-10-CM | POA: Insufficient documentation

## 2012-11-02 DIAGNOSIS — Z8742 Personal history of other diseases of the female genital tract: Secondary | ICD-10-CM | POA: Insufficient documentation

## 2012-11-02 DIAGNOSIS — R61 Generalized hyperhidrosis: Secondary | ICD-10-CM | POA: Insufficient documentation

## 2012-11-02 DIAGNOSIS — Z91040 Latex allergy status: Secondary | ICD-10-CM | POA: Insufficient documentation

## 2012-11-02 DIAGNOSIS — R079 Chest pain, unspecified: Secondary | ICD-10-CM

## 2012-11-02 DIAGNOSIS — N189 Chronic kidney disease, unspecified: Secondary | ICD-10-CM | POA: Insufficient documentation

## 2012-11-02 DIAGNOSIS — Z86718 Personal history of other venous thrombosis and embolism: Secondary | ICD-10-CM | POA: Insufficient documentation

## 2012-11-02 DIAGNOSIS — Z8739 Personal history of other diseases of the musculoskeletal system and connective tissue: Secondary | ICD-10-CM | POA: Insufficient documentation

## 2012-11-02 DIAGNOSIS — Z87891 Personal history of nicotine dependence: Secondary | ICD-10-CM | POA: Insufficient documentation

## 2012-11-02 DIAGNOSIS — Z8719 Personal history of other diseases of the digestive system: Secondary | ICD-10-CM | POA: Insufficient documentation

## 2012-11-02 DIAGNOSIS — J45909 Unspecified asthma, uncomplicated: Secondary | ICD-10-CM | POA: Insufficient documentation

## 2012-11-02 DIAGNOSIS — E119 Type 2 diabetes mellitus without complications: Secondary | ICD-10-CM | POA: Insufficient documentation

## 2012-11-02 DIAGNOSIS — Z880 Allergy status to penicillin: Secondary | ICD-10-CM | POA: Insufficient documentation

## 2012-11-02 DIAGNOSIS — J438 Other emphysema: Secondary | ICD-10-CM | POA: Insufficient documentation

## 2012-11-02 DIAGNOSIS — I129 Hypertensive chronic kidney disease with stage 1 through stage 4 chronic kidney disease, or unspecified chronic kidney disease: Secondary | ICD-10-CM | POA: Insufficient documentation

## 2012-11-02 DIAGNOSIS — I509 Heart failure, unspecified: Secondary | ICD-10-CM | POA: Insufficient documentation

## 2012-11-02 DIAGNOSIS — Z7902 Long term (current) use of antithrombotics/antiplatelets: Secondary | ICD-10-CM | POA: Insufficient documentation

## 2012-11-02 LAB — COMPREHENSIVE METABOLIC PANEL
AST: 22 U/L (ref 0–37)
Albumin: 3.6 g/dL (ref 3.5–5.2)
Chloride: 100 mEq/L (ref 96–112)
Creatinine, Ser: 1.2 mg/dL — ABNORMAL HIGH (ref 0.50–1.10)
Total Bilirubin: 0.2 mg/dL — ABNORMAL LOW (ref 0.3–1.2)
Total Protein: 7.2 g/dL (ref 6.0–8.3)

## 2012-11-02 LAB — D-DIMER, QUANTITATIVE: D-Dimer, Quant: <0.27 ug/mL-FEU (ref 0.00–0.48)

## 2012-11-02 LAB — CBC WITH DIFFERENTIAL/PLATELET
Basophils Absolute: 0 10*3/uL (ref 0.0–0.1)
Basophils Relative: 1 % (ref 0–1)
HCT: 40.1 % (ref 36.0–46.0)
MCHC: 32.9 g/dL (ref 30.0–36.0)
Monocytes Absolute: 0.6 10*3/uL (ref 0.1–1.0)
Neutro Abs: 2.6 10*3/uL (ref 1.7–7.7)
Neutrophils Relative %: 45 % (ref 43–77)
RDW: 13.6 % (ref 11.5–15.5)

## 2012-11-02 NOTE — ED Provider Notes (Signed)
History  This chart was scribed for Donnetta Hutching, MD by Bennett Scrape, ED Scribe. This patient was seen in room [facility] and the patient's care was started at 11:05 PM.  CSN: [id number]  Arrival date & time 11/02/12  2231   First MD Initiated Contact with Patient 11/02/12 2305      Chief Complaint  Patient presents with  . Chest Pain     The history is provided by the patient. No language interpreter was used.   HPI Comments: [name] is a 67 y.o. female who presents to the Emergency Department complaining of mid throacic back pain, right greater than left, that radiates to bilateral lower chest with associated SOB and diaphoresis that started yesterday. The pain is worse with bending over. She reports having a h/o DVT and possible PE, last DVT was "quite a while ago" and states that the symptoms are similar. She currently takes 2.5 mg coumadin in the evening but reports a recent missed dose. She is on oxygen at home for COPD and uses a walker/cane at baseline due to balance issues. She denies nausea, fever and emesis as associated symptoms. She also has a h/o DM, HTN and CHF.  PCP is Dr. Daphine Deutscher.   Past Medical History  Diagnosis Date  . Hypertension   . Diabetes mellitus   . Arthritis   . Blood transfusion   . Hx pulmonary embolism   . Anemia   . COPD (chronic obstructive pulmonary disease)   . Depression   . Anxiety   . Gastroesophageal reflux disease   . Osteoporosis   . DDD (degenerative disc disease)   . Uterine prolapse   . Uterine prolapse   . Enlarged heart   . Asthma   . CHF (congestive heart failure)   . Emphysema of lung   . Heart murmur   . Hyperlipidemia   . Thyroid disease   . Shortness of breath   . Chronic kidney disease     lt kidney limited fxn  . Seizures     once  . Headache   . Dysrhythmia     Past Surgical History  Procedure Laterality Date  . Hiatel hernia repair    . Tubal ligation      Family History  Problem Relation  Age of Onset  . Heart disease Mother   . Stroke Mother   . Prostate cancer Father   . Cancer Father     bone  . Liver cancer Brother   . Cancer Brother     leukemia  . Alzheimer's disease      family history  . Cancer Daughter     breast  . Cancer Paternal Uncle     unsure of what kind    History  Substance Use Topics  . Smoking status: Former Games developer  . Smokeless tobacco: Not on file     Comment: quit in 1996  . Alcohol Use: No    No OB history provided.  Review of Systems  A complete 10 system review of systems was obtained and all systems are negative except as noted in the HPI and PMH.   Allergies  Procaine hcl; Codeine; Latex; and Penicillins  Home Medications   Current Outpatient Rx  Name  Route  Sig  Dispense  Refill  . albuterol (PROVENTIL HFA;VENTOLIN HFA) 108 (90 BASE) MCG/ACT inhaler   Inhalation   Inhale 2 puffs into the lungs every 6 (six) hours as needed. For shortness of breath         .  amitriptyline (ELAVIL) 25 MG tablet   Oral   Take 25 mg by mouth at bedtime.           . Choline Fenofibrate (TRILIPIX) 135 MG capsule   Oral   Take 135 mg by mouth every morning.          . diazepam (VALIUM) 10 MG tablet   Oral   Take 10 mg by mouth 3 (three) times daily.          Marland Kitchen escitalopram (LEXAPRO) 20 MG tablet   Oral   Take 20 mg by mouth every morning.          . fish oil-omega-3 fatty acids 1000 MG capsule   Oral   Take 1 g by mouth every morning.          . Folic Acid-Vit B6-Vit B12 (FA-VITAMIN B-6-VITAMIN B-12) 2.2-25-0.5 MG TABS   Oral   Take 1 tablet by mouth daily.         . furosemide (LASIX) 40 MG tablet   Oral   Take 20 mg by mouth daily.         Marland Kitchen gabapentin (NEURONTIN) 300 MG capsule   Oral   Take 1 capsule (300 mg total) by mouth at bedtime and may repeat dose one time if needed.   30 capsule   2   . glimepiride (AMARYL) 1 MG tablet   Oral   Take 1 tablet (1 mg total) by mouth daily before breakfast.    30 tablet   2   . levothyroxine (SYNTHROID, LEVOTHROID) 75 MCG tablet   Oral   Take 75 mcg by mouth every morning.         . linagliptin (TRADJENTA) 5 MG TABS tablet   Oral   Take 5 mg by mouth daily.         . metoprolol succinate (TOPROL-XL) 25 MG 24 hr tablet   Oral   Take 25 mg by mouth every morning.          . potassium chloride SA (K-DUR,KLOR-CON) 20 MEQ tablet   Oral   Take 10 mEq by mouth daily.          . simvastatin (ZOCOR) 40 MG tablet   Oral   Take 1 tablet (40 mg total) by mouth at bedtime.   30 tablet   3   . traMADol (ULTRAM) 50 MG tablet   Oral   Take 50 mg by mouth 3 (three) times daily.         Marland Kitchen warfarin (COUMADIN) 5 MG tablet   Oral   Take 2.5 mg by mouth every evening.          Marland Kitchen ipratropium-albuterol (DUONEB) 0.5-2.5 (3) MG/3ML SOLN   Nebulization   Take 3 mLs by nebulization every 4 (four) hours as needed. For shortness of breath            Triage Vitals: BP 131/55  Pulse 61  Temp(Src) 97.6 F (36.4 C) (Oral)  Resp 16  Ht 5\' 3"  (1.6 m)  Wt 175 lb (79.379 kg)  BMI 31.01 kg/m2  SpO2 97%  Physical Exam  Nursing note and vitals reviewed. Constitutional: She is oriented to person, place, and time. She appears well-developed and well-nourished.  HENT:  Head: Normocephalic and atraumatic.  Eyes: Conjunctivae and EOM are normal. Pupils are equal, round, and reactive to light.  Neck: Normal range of motion. Neck supple.  Cardiovascular: Normal rate, regular rhythm and normal heart sounds.  Pulmonary/Chest: Effort normal and breath sounds normal.  Abdominal: Soft. Bowel sounds are normal.  Musculoskeletal: Normal range of motion.  Neurological: She is alert and oriented to person, place, and time.  Skin: Skin is warm and dry.  Psychiatric: She has a normal mood and affect.    ED Course  Procedures (including critical care time)  DIAGNOSTIC STUDIES: Oxygen Saturation is 97% on room air, normal by my interpretation.     COORDINATION OF CARE: 11:27 PM-Discussed treatment plan which includes CXR, CBC panel, CMP and d-dimer with pt at bedside and pt agreed to plan.   Labs Reviewed  COMPREHENSIVE METABOLIC PANEL - Abnormal; Notable for the following:    CO2 35 (*)    Glucose, Bld 105 (*)    Creatinine, Ser 1.20 (*)    Total Bilirubin 0.2 (*)    GFR calc non Af Amer 47 (*)    GFR calc Af Amer 54 (*)    All other components within normal limits  GLUCOSE, CAPILLARY - Abnormal; Notable for the following:    Glucose-Capillary 108 (*)    All other components within normal limits  CBC WITH DIFFERENTIAL  TROPONIN I  D-DIMER, QUANTITATIVE    Dg Chest Portable 1 View  11/02/2012   *RADIOLOGY REPORT*  Clinical Data: Chest pain  PORTABLE CHEST - 1 VIEW  Comparison: Chest radiograph 04/09/2012  Findings: Stable enlarged cardiac silhouette.  There is mild atelectasis in the left lower lobe similar to prior.  No effusion, infiltrate, or pneumothorax.  Calcified granuloma in the right upper lobe.  IMPRESSION: 1.  No acute findings. 2.  Cardiomegaly.   Original Report Authenticated By: Genevive Bi, M.D.     Date: 11/03/2012  Rate: 51  Rhythm: sinus brady  QRS Axis: normal  Intervals: normal  ST/T Wave abnormalities: normal  Conduction Disutrbances: none  Narrative Interpretation: unremarkable    No results found.   No diagnosis found.    MDM  Patient is resting comfortably. Screening tests normal. Patient has primary care follow up      I personally performed the services described in this documentation, which was scribed in my presence. The recorded information has been reviewed and is accurate.    Donnetta Hutching, MD 11/03/12 [phone]

## 2012-11-02 NOTE — ED Notes (Signed)
Chest pain all day.

## 2012-11-03 LAB — GLUCOSE, CAPILLARY: Glucose-Capillary: 108 mg/dL — ABNORMAL HIGH (ref 70–99)

## 2012-11-03 NOTE — ED Notes (Signed)
Patient denies chest pain at this time. Reports constant back pain since yesterday.

## 2012-11-05 ENCOUNTER — Other Ambulatory Visit: Payer: Self-pay

## 2012-11-05 MED ORDER — IPRATROPIUM-ALBUTEROL 0.5-2.5 (3) MG/3ML IN SOLN: 3.0000 mL | RESPIRATORY_TRACT | Status: DC | PRN

## 2012-11-08 ENCOUNTER — Other Ambulatory Visit: Payer: Self-pay | Admitting: Nurse Practitioner

## 2012-11-09 ENCOUNTER — Other Ambulatory Visit: Payer: Self-pay | Admitting: Nurse Practitioner

## 2012-11-10 NOTE — Telephone Encounter (Signed)
Last filled 10/12/12, last seen 07/01/12

## 2012-11-10 NOTE — Telephone Encounter (Signed)
rx sent to pharmacy

## 2012-11-12 ENCOUNTER — Other Ambulatory Visit: Payer: Self-pay | Admitting: Nurse Practitioner

## 2012-11-13 ENCOUNTER — Ambulatory Visit: Payer: Self-pay | Admitting: Neurology

## 2012-11-16 ENCOUNTER — Telehealth: Payer: Self-pay | Admitting: Nurse Practitioner

## 2012-11-16 ENCOUNTER — Other Ambulatory Visit: Payer: Self-pay | Admitting: Nurse Practitioner

## 2012-11-16 ENCOUNTER — Ambulatory Visit: Payer: Medicaid Other

## 2012-11-16 NOTE — Telephone Encounter (Signed)
aPpt made 

## 2012-11-18 ENCOUNTER — Ambulatory Visit (INDEPENDENT_AMBULATORY_CARE_PROVIDER_SITE_OTHER): Payer: Medicare Other | Admitting: General Practice

## 2012-11-18 ENCOUNTER — Encounter: Payer: Self-pay | Admitting: General Practice

## 2012-11-18 VITALS — BP 114/65 | HR 58 | Temp 97.3°F | Ht 62.0 in | Wt 192.0 lb

## 2012-11-18 DIAGNOSIS — F32A Depression, unspecified: Secondary | ICD-10-CM

## 2012-11-18 DIAGNOSIS — I10 Essential (primary) hypertension: Secondary | ICD-10-CM

## 2012-11-18 DIAGNOSIS — F329 Major depressive disorder, single episode, unspecified: Secondary | ICD-10-CM | POA: Diagnosis not present

## 2012-11-18 DIAGNOSIS — R7309 Other abnormal glucose: Secondary | ICD-10-CM

## 2012-11-18 DIAGNOSIS — E039 Hypothyroidism, unspecified: Secondary | ICD-10-CM

## 2012-11-18 DIAGNOSIS — E785 Hyperlipidemia, unspecified: Secondary | ICD-10-CM

## 2012-11-18 DIAGNOSIS — R739 Hyperglycemia, unspecified: Secondary | ICD-10-CM

## 2012-11-18 LAB — THYROID PANEL WITH TSH
Free Thyroxine Index: 2.7 (ref 1.0–3.9)
T3 Uptake: 28 % (ref 22.5–37.0)
TSH: 5.663 u[IU]/mL — ABNORMAL HIGH (ref 0.350–4.500)

## 2012-11-18 LAB — POCT CBC
Granulocyte percent: 68.3 %G (ref 37–80)
Lymph, poc: 1.5 (ref 0.6–3.4)
MCH, POC: 30.1 pg (ref 27–31.2)
MPV: 8.9 fL (ref 0–99.8)
POC LYMPH PERCENT: 25.2 %L (ref 10–50)
Platelet Count, POC: 218 10*3/uL (ref 142–424)
RBC: 4.3 M/uL (ref 4.04–5.48)

## 2012-11-18 LAB — COMPLETE METABOLIC PANEL WITH GFR
ALT: 21 U/L (ref 0–35)
AST: 24 U/L (ref 0–37)
Alkaline Phosphatase: 38 U/L — ABNORMAL LOW (ref 39–117)
Calcium: 9.4 mg/dL (ref 8.4–10.5)
Chloride: 99 mEq/L (ref 96–112)
Creat: 1.14 mg/dL — ABNORMAL HIGH (ref 0.50–1.10)
Potassium: 4.2 mEq/L (ref 3.5–5.3)

## 2012-11-18 NOTE — Progress Notes (Signed)
  Subjective:    Patient ID: [name], female    DOB: September 02, 1945, 67 y.o.   MRN: [id number]  HPI Presents today for follow up of chronic health conditions. She has a history of hypertension, hyperlipidemia, depression, NIDDM, hypothyroidism, and COPD. She reports taking medications as prescribed. Reports taking blood sugars only when feeling symptomatic. Reports trying to eat a healthy diet (low fat, low carb, baked/broiled foods). Denies regular exercise. Reports medication is helping control depression.     Review of Systems  Constitutional: Negative for fever and chills.  HENT: Negative for ear pain and neck pain.   Respiratory: Negative for chest tightness and shortness of breath.        Reports having shortness of breath at times. She reports wearing oxygen via nasal cannula at home, 2 liters as needed.   Cardiovascular: Negative for chest pain and palpitations.  Gastrointestinal: Negative for abdominal pain, abdominal distention and anal bleeding.  Genitourinary: Negative for dysuria, hematuria, difficulty urinating and pelvic pain.  Neurological: Negative for dizziness and headaches.  Psychiatric/Behavioral: Negative for suicidal ideas and self-injury.       Objective:   Physical Exam  Constitutional: She appears well-developed and well-nourished.  HENT:  Head: Normocephalic and atraumatic.  Right Ear: External ear normal.  Left Ear: External ear normal.  Cardiovascular: Normal rate, regular rhythm and normal heart sounds.   Pulmonary/Chest: Effort normal. No respiratory distress. She exhibits no tenderness.  Abdominal: Soft. Bowel sounds are normal. She exhibits no distension. There is no tenderness. There is no rebound.  Neurological: She is alert.  Skin: Skin is warm and dry.  Psychiatric: She has a normal mood and affect.          Assessment & Plan:  1. Essential hypertension, benign  2. Other and unspecified hyperlipidemia - POCT CBC - COMPLETE METABOLIC  PANEL WITH GFR - NMR Lipoprofile with Lipids  3. Hypothyroidism - Thyroid Panel With TSH  4. Depression  5. Hyperglycemia - POCT glycosylated hemoglobin (Hb A1C) -Continue all current medications F/u in 3 months Discussed exercise and diet  Patient verbalized understanding Coralie Keens, FNP-C

## 2012-11-18 NOTE — Patient Instructions (Signed)

## 2012-11-19 ENCOUNTER — Other Ambulatory Visit: Payer: Self-pay | Admitting: General Practice

## 2012-11-19 DIAGNOSIS — E039 Hypothyroidism, unspecified: Secondary | ICD-10-CM

## 2012-11-19 LAB — NMR LIPOPROFILE WITH LIPIDS
Cholesterol, Total: 134 mg/dL (ref <200)
HDL Size: 8.8 nm — ABNORMAL LOW (ref >=9.2)
LDL (calc): 53 mg/dL (ref <100)
LDL Particle Number: 891 nmol/L (ref <1000)
LDL Size: 20.4 nm — ABNORMAL LOW (ref >20.5)
LP-IR Score: 77 — ABNORMAL HIGH (ref <=45)
Small LDL Particle Number: 421 nmol/L (ref <=527)
VLDL Size: 60.7 nm — ABNORMAL HIGH (ref <=46.6)

## 2012-11-19 MED ORDER — LEVOTHYROXINE SODIUM 88 MCG PO TABS: 88.0000 ug | ORAL_TABLET | Freq: Every day | ORAL | Status: DC

## 2012-11-20 ENCOUNTER — Telehealth: Payer: Self-pay | Admitting: Nurse Practitioner

## 2012-11-20 NOTE — Telephone Encounter (Signed)
Patient aware that we sent rx into pharmacy

## 2012-11-21 ENCOUNTER — Other Ambulatory Visit: Payer: Self-pay | Admitting: General Practice

## 2012-11-21 DIAGNOSIS — E039 Hypothyroidism, unspecified: Secondary | ICD-10-CM

## 2012-11-25 MED ORDER — LINAGLIPTIN 5 MG PO TABS: 5.0000 mg | ORAL_TABLET | Freq: Every day | ORAL | Status: DC

## 2012-11-25 MED ORDER — WARFARIN SODIUM 5 MG PO TABS: 2.5000 mg | ORAL_TABLET | Freq: Every evening | ORAL | Status: DC

## 2012-11-25 MED ORDER — CHOLINE FENOFIBRATE 135 MG PO CPDR: DELAYED_RELEASE_CAPSULE | ORAL | Status: DC

## 2012-11-25 MED ORDER — FUROSEMIDE 40 MG PO TABS: 20.0000 mg | ORAL_TABLET | Freq: Every day | ORAL | Status: DC

## 2012-11-25 MED ORDER — SIMVASTATIN 40 MG PO TABS: 40.0000 mg | ORAL_TABLET | Freq: Every day | ORAL | Status: DC

## 2012-11-25 MED ORDER — GLIMEPIRIDE 1 MG PO TABS: 1.0000 mg | ORAL_TABLET | Freq: Every day | ORAL | Status: DC

## 2012-11-25 MED ORDER — GABAPENTIN 300 MG PO CAPS: 300.0000 mg | ORAL_CAPSULE | Freq: Every evening | ORAL | Status: DC | PRN

## 2012-11-25 MED ORDER — METOPROLOL SUCCINATE ER 25 MG PO TB24: 25.0000 mg | ORAL_TABLET | Freq: Every morning | ORAL | Status: DC

## 2012-11-25 MED ORDER — ALBUTEROL SULFATE HFA 108 (90 BASE) MCG/ACT IN AERS: 2.0000 | INHALATION_SPRAY | Freq: Four times a day (QID) | RESPIRATORY_TRACT | Status: DC | PRN

## 2012-11-25 MED ORDER — LEVOTHYROXINE SODIUM 88 MCG PO TABS: 88.0000 ug | ORAL_TABLET | Freq: Every day | ORAL | Status: DC

## 2012-11-25 MED ORDER — TRAMADOL HCL 50 MG PO TABS: ORAL_TABLET | ORAL | Status: DC

## 2012-11-25 MED ORDER — POTASSIUM CHLORIDE CRYS ER 20 MEQ PO TBCR: 10.0000 meq | EXTENDED_RELEASE_TABLET | Freq: Every day | ORAL | Status: DC

## 2012-12-06 ENCOUNTER — Emergency Department (HOSPITAL_COMMUNITY)
Admission: EM | Admit: 2012-12-06 | Discharge: 2012-12-06 | Disposition: A | Discharge status: Home / Self Care | Payer: Medicaid Other | Attending: Emergency Medicine | Admitting: Emergency Medicine

## 2012-12-06 ENCOUNTER — Encounter (HOSPITAL_COMMUNITY): Payer: Self-pay | Admitting: *Deleted

## 2012-12-06 DIAGNOSIS — F329 Major depressive disorder, single episode, unspecified: Secondary | ICD-10-CM | POA: Insufficient documentation

## 2012-12-06 DIAGNOSIS — R011 Cardiac murmur, unspecified: Secondary | ICD-10-CM | POA: Insufficient documentation

## 2012-12-06 DIAGNOSIS — J449 Chronic obstructive pulmonary disease, unspecified: Secondary | ICD-10-CM | POA: Insufficient documentation

## 2012-12-06 DIAGNOSIS — E119 Type 2 diabetes mellitus without complications: Secondary | ICD-10-CM | POA: Insufficient documentation

## 2012-12-06 DIAGNOSIS — N189 Chronic kidney disease, unspecified: Secondary | ICD-10-CM | POA: Insufficient documentation

## 2012-12-06 DIAGNOSIS — I509 Heart failure, unspecified: Secondary | ICD-10-CM | POA: Insufficient documentation

## 2012-12-06 DIAGNOSIS — W1809XA Striking against other object with subsequent fall, initial encounter: Secondary | ICD-10-CM | POA: Insufficient documentation

## 2012-12-06 DIAGNOSIS — D649 Anemia, unspecified: Secondary | ICD-10-CM | POA: Insufficient documentation

## 2012-12-06 DIAGNOSIS — R42 Dizziness and giddiness: Secondary | ICD-10-CM | POA: Insufficient documentation

## 2012-12-06 DIAGNOSIS — J4489 Other specified chronic obstructive pulmonary disease: Secondary | ICD-10-CM | POA: Insufficient documentation

## 2012-12-06 DIAGNOSIS — Y929 Unspecified place or not applicable: Secondary | ICD-10-CM | POA: Insufficient documentation

## 2012-12-06 DIAGNOSIS — Y939 Activity, unspecified: Secondary | ICD-10-CM | POA: Insufficient documentation

## 2012-12-06 DIAGNOSIS — Z8719 Personal history of other diseases of the digestive system: Secondary | ICD-10-CM | POA: Insufficient documentation

## 2012-12-06 DIAGNOSIS — Z880 Allergy status to penicillin: Secondary | ICD-10-CM | POA: Insufficient documentation

## 2012-12-06 DIAGNOSIS — F411 Generalized anxiety disorder: Secondary | ICD-10-CM | POA: Insufficient documentation

## 2012-12-06 DIAGNOSIS — Z7901 Long term (current) use of anticoagulants: Secondary | ICD-10-CM | POA: Insufficient documentation

## 2012-12-06 DIAGNOSIS — I498 Other specified cardiac arrhythmias: Secondary | ICD-10-CM | POA: Insufficient documentation

## 2012-12-06 DIAGNOSIS — Z79899 Other long term (current) drug therapy: Secondary | ICD-10-CM | POA: Insufficient documentation

## 2012-12-06 DIAGNOSIS — F3289 Other specified depressive episodes: Secondary | ICD-10-CM | POA: Insufficient documentation

## 2012-12-06 DIAGNOSIS — Z91040 Latex allergy status: Secondary | ICD-10-CM | POA: Insufficient documentation

## 2012-12-06 DIAGNOSIS — Z8679 Personal history of other diseases of the circulatory system: Secondary | ICD-10-CM | POA: Insufficient documentation

## 2012-12-06 DIAGNOSIS — E785 Hyperlipidemia, unspecified: Secondary | ICD-10-CM | POA: Insufficient documentation

## 2012-12-06 DIAGNOSIS — E079 Disorder of thyroid, unspecified: Secondary | ICD-10-CM | POA: Insufficient documentation

## 2012-12-06 DIAGNOSIS — Z8669 Personal history of other diseases of the nervous system and sense organs: Secondary | ICD-10-CM | POA: Insufficient documentation

## 2012-12-06 DIAGNOSIS — I129 Hypertensive chronic kidney disease with stage 1 through stage 4 chronic kidney disease, or unspecified chronic kidney disease: Secondary | ICD-10-CM | POA: Insufficient documentation

## 2012-12-06 DIAGNOSIS — Z87891 Personal history of nicotine dependence: Secondary | ICD-10-CM | POA: Insufficient documentation

## 2012-12-06 DIAGNOSIS — Z86711 Personal history of pulmonary embolism: Secondary | ICD-10-CM | POA: Insufficient documentation

## 2012-12-06 DIAGNOSIS — Z8739 Personal history of other diseases of the musculoskeletal system and connective tissue: Secondary | ICD-10-CM | POA: Insufficient documentation

## 2012-12-06 DIAGNOSIS — Z8742 Personal history of other diseases of the female genital tract: Secondary | ICD-10-CM | POA: Insufficient documentation

## 2012-12-06 DIAGNOSIS — R112 Nausea with vomiting, unspecified: Secondary | ICD-10-CM | POA: Insufficient documentation

## 2012-12-06 DIAGNOSIS — T07XXXA Unspecified multiple injuries, initial encounter: Secondary | ICD-10-CM | POA: Insufficient documentation

## 2012-12-06 LAB — CBC WITH DIFFERENTIAL/PLATELET
Basophils Relative: 0 % (ref 0–1)
Eosinophils Absolute: 0.1 10*3/uL (ref 0.0–0.7)
Eosinophils Relative: 1 % (ref 0–5)
MCH: 29.8 pg (ref 26.0–34.0)
MCHC: 32.4 g/dL (ref 30.0–36.0)
MCV: 91.8 fL (ref 78.0–100.0)
Neutrophils Relative %: 55 % (ref 43–77)
Platelets: 234 10*3/uL (ref 150–400)
RDW: 14.3 % (ref 11.5–15.5)

## 2012-12-06 LAB — BASIC METABOLIC PANEL
Calcium: 9.3 mg/dL (ref 8.4–10.5)
GFR calc Af Amer: 55 mL/min — ABNORMAL LOW (ref >90)
GFR calc non Af Amer: 48 mL/min — ABNORMAL LOW (ref >90)
Potassium: 3.5 mEq/L (ref 3.5–5.1)
Sodium: 143 mEq/L (ref 135–145)

## 2012-12-06 MED ORDER — MECLIZINE HCL 50 MG PO TABS: 50.0000 mg | ORAL_TABLET | Freq: Three times a day (TID) | ORAL | Status: DC | PRN

## 2012-12-06 NOTE — ED Provider Notes (Signed)
History    This chart was scribed for Monica Roller, MD by Leone Payor, ED Scribe. This patient was seen in room APA08/APA08 and the patient's care was started 3:39 PM.  CSN: [id number] Arrival date & time 12/06/12  1457  First MD Initiated Contact with Patient 12/06/12 1531     Chief Complaint  Patient presents with  . Fall  . Dizziness    The history is provided by the patient. No language interpreter was used.    HPI Comments: [name] is a 67 y.o. female who presents to the Emergency Department complaining of intermittent, vertigo type dizziness that started several days ago that she first noticed when she stood up too quickly. She had acute onset of the room spinning sensation, lasted approximately 1 minute during which time she fell striking her left chest wall, left lower extremity and left forearm. It went away after one minute and she was back to herself. This has happened several times since that time and she associates this with movement such as standing up, sitting down or turning from side to side. She does have associated nausea and has had vomiting with these episodes but denies chest pain, palpitations, back pain, neck pain, weakness, numbness, blurred vision. She has no loss of consciousness, no head injuries and is not having no symptoms at this time.  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  Past Medical History  Diagnosis Date  . Hypertension   . Diabetes mellitus   . Arthritis   . Blood transfusion   . Hx pulmonary embolism   . Anemia   . COPD (chronic obstructive pulmonary disease)   . Depression   . Anxiety   . Gastroesophageal reflux disease   . Osteoporosis   . DDD (degenerative disc disease)   . Uterine prolapse   . Uterine prolapse   . Enlarged heart   . Asthma   . CHF (congestive heart failure)   . Emphysema of lung   . Heart murmur   . Hyperlipidemia   . Thyroid  disease   . Shortness of breath   . Chronic kidney disease     lt kidney limited fxn  . Seizures     once  . Headache(784.0)   . Dysrhythmia    Past Surgical History  Procedure Laterality Date  . Hiatel hernia repair    . Tubal ligation     Family History  Problem Relation Age of Onset  . Heart disease Mother   . Stroke Mother   . Prostate cancer Father   . Cancer Father     bone  . Liver cancer Brother   . Cancer Brother     leukemia  . Alzheimer's disease      family history  . Cancer Daughter     breast  . Cancer Paternal Uncle     unsure of what kind   History  Substance Use Topics  . Smoking status: Former Games developer  . Smokeless tobacco: Not on file     Comment: quit in 1996  . Alcohol Use: No   OB History   Grav Para Term Preterm Abortions TAB SAB Ect Mult Living                 Review of Systems A complete 10 system review of systems was obtained and all systems are negative except as noted  in the HPI and PMH.   Allergies  Procaine hcl; Codeine; Latex; and Penicillins  Home Medications   Current Outpatient Rx  Name  Route  Sig  Dispense  Refill  . albuterol (PROVENTIL HFA;VENTOLIN HFA) 108 (90 BASE) MCG/ACT inhaler   Inhalation   Inhale 2 puffs into the lungs every 6 (six) hours as needed. For shortness of breath   1 Inhaler   2   . amitriptyline (ELAVIL) 25 MG tablet   Oral   Take 25 mg by mouth at bedtime.           . Choline Fenofibrate 135 MG capsule   Oral   Take 135 mg by mouth daily.         . diazepam (VALIUM) 10 MG tablet   Oral   Take 10 mg by mouth 3 (three) times daily.          Marland Kitchen escitalopram (LEXAPRO) 20 MG tablet   Oral   Take 20 mg by mouth every morning.          . fish oil-omega-3 fatty acids 1000 MG capsule   Oral   Take 1 g by mouth every morning.          . Folic Acid-Vit B6-Vit B12 (FA-VITAMIN B-6-VITAMIN B-12) 2.2-25-0.5 MG TABS   Oral   Take 1 tablet by mouth daily.         . furosemide (LASIX)  40 MG tablet   Oral   Take 0.5 tablets (20 mg total) by mouth daily.   30 tablet   2   . gabapentin (NEURONTIN) 300 MG capsule   Oral   Take 1 capsule (300 mg total) by mouth at bedtime and may repeat dose one time if needed.   30 capsule   2   . glimepiride (AMARYL) 1 MG tablet   Oral   Take 1 tablet (1 mg total) by mouth daily before breakfast.   30 tablet   2   . ipratropium-albuterol (DUONEB) 0.5-2.5 (3) MG/3ML SOLN   Nebulization   Take 3 mLs by nebulization every 4 (four) hours as needed. For shortness of breath   360 mL   1   . levothyroxine (SYNTHROID, LEVOTHROID) 88 MCG tablet   Oral   Take 1 tablet (88 mcg total) by mouth daily before breakfast.   30 tablet   3   . linagliptin (TRADJENTA) 5 MG TABS tablet   Oral   Take 1 tablet (5 mg total) by mouth daily.   30 tablet   2   . metoprolol succinate (TOPROL-XL) 25 MG 24 hr tablet   Oral   Take 1 tablet (25 mg total) by mouth every morning.   30 tablet   2   . potassium chloride SA (K-DUR,KLOR-CON) 20 MEQ tablet   Oral   Take 0.5 tablets (10 mEq total) by mouth daily.   30 tablet   1   . simvastatin (ZOCOR) 40 MG tablet   Oral   Take 1 tablet (40 mg total) by mouth at bedtime.   30 tablet   3   . traMADol (ULTRAM) 50 MG tablet   Oral   Take 50 mg by mouth every 8 (eight) hours as needed for pain.         Marland Kitchen warfarin (COUMADIN) 5 MG tablet   Oral   Take 0.5 tablets (2.5 mg total) by mouth every evening.   30 tablet   1   . meclizine (ANTIVERT) 50 MG  tablet   Oral   Take 1 tablet (50 mg total) by mouth 3 (three) times daily as needed.   30 tablet   0    BP 120/45  Pulse 54  Temp(Src) 98.1 F (36.7 C) (Oral)  Resp 20  Ht 5\' 2"  (1.575 m)  Wt 200 lb (90.719 kg)  BMI 36.57 kg/m2  SpO2 99% Physical Exam  Nursing note and vitals reviewed. Constitutional: She is oriented to person, place, and time. She appears well-developed and well-nourished.  HENT:  Head: Normocephalic and  atraumatic.  No teeth.  [name]  Eyes: Conjunctivae and EOM are normal. Pupils are equal, round, and reactive to light.  Neck: Normal range of motion. Neck supple.  Cardiovascular: Regular rhythm, normal heart sounds and intact distal pulses.  Bradycardia present.   No murmur heard. Strong pulses.   Pulmonary/Chest: Effort normal and breath sounds normal. No respiratory distress. She has no wheezes. She has no rales. She exhibits tenderness.  Chest wall tenderness. Bruising to left breast. Chaperone present for exam  Abdominal: Soft. Bowel sounds are normal. She exhibits no distension and no mass. There is no tenderness. There is no rebound and no guarding.  Musculoskeletal: Normal range of motion.  Neurological: She is alert and oriented to person, place, and time.  Neurologic exam:  Speech clear, pupils equal round reactive to light, extraocular movements intact  Normal peripheral visual fields Cranial nerves III through XII normal including no facial droop Follows commands, moves all extremities x4, normal strength to bilateral upper and lower extremities at all major muscle groups including grip Sensation normal to light touch and pinprick Coordination intact, no limb ataxia, finger-nose-finger normal Rapid alternating movements normal No pronator drift Gait normal  No nystagmus at rest and no inducible mastectomies  Skin: Skin is warm and dry.  Psychiatric: She has a normal mood and affect.    ED Course  Procedures (including critical care time)  DIAGNOSTIC STUDIES: Oxygen Saturation is 99% on RA, normal by my interpretation.    COORDINATION OF CARE: 3:40 PM Discussed treatment plan with pt at bedside and pt agreed to plan.    Labs Reviewed  CBC WITH DIFFERENTIAL - Abnormal; Notable for the following:    Hemoglobin 11.9 (*)    All other components within normal limits  BASIC METABOLIC PANEL - Abnormal; Notable for the following:    CO2 35 (*)    Glucose, Bld 115 (*)     Creatinine, Ser 1.18 (*)    GFR calc non Af Amer 48 (*)    GFR calc Af Amer 55 (*)    All other components within normal limits  TROPONIN I   No results found. 1. Vertigo   2. Contusion of multiple sites     MDM  At this time the patient appears well, she is not in acute distress, has no focal neurologic deficits and has no nystagmus. Her intermittent dizziness appears to be consistent with a peripheral vertigo. A CT scan of her brain performed approximately 26 months ago showed no acute findings after syncope with a minor head injury. I will obtain basic labs and an EKG, otherwise the patient appears stable for discharge to followup with her family doctor. This does not appear to be posterior circulation driven, she has no neck pain, no headache, no visual changes and her symptoms are related to head position.   ED ECG REPORT  I personally interpreted this EKG   Date: 12/06/2012   Rate: 49  Rhythm:  sinus bradycardia  QRS Axis: normal  Intervals: normal  ST/T Wave abnormalities: nonspecific T wave changes  Conduction Disutrbances:none  Narrative Interpretation: Low voltage present  Old EKG Reviewed: Compared with 11/02/2012, no significant changes  The patient is asymptomatic, she has no abnormal labs which are of any significance, vital signs remained in a stable range. She is having no significant concerning neurologic symptoms other than intermittent vertigo which appears to be peripheral by history and the fact that I cannot induce it and she does not have it at this time. Because she is on Coumadin she is likely having easy bruising from her fall but has no tenderness over her bony structure including her ribs, thigh, arm. She did not injure her head, she has normal mental status and level of alertness and is stable for discharge.  Meds given in ED:  Medications - No data to display  New Prescriptions   MECLIZINE (ANTIVERT) 50 MG TABLET    Take 1 tablet (50 mg total) by mouth 3  (three) times daily as needed.    I personally performed the services described in this documentation, which was scribed in my presence. The recorded information has been reviewed and is accurate.       Monica Roller, MD 12/06/12 [phone]

## 2012-12-06 NOTE — ED Notes (Signed)
Pt c/o dizziness since Thursday, states that the dizziness caused her to fall Thursday hitting her left side of body on the floor, denies any LOC. Pt c/o pain to left breast area, left leg, left hip area from the fall.

## 2012-12-12 ENCOUNTER — Emergency Department (HOSPITAL_COMMUNITY): Payer: Medicaid Other

## 2012-12-12 ENCOUNTER — Emergency Department (HOSPITAL_COMMUNITY)
Admission: EM | Admit: 2012-12-12 | Discharge: 2012-12-12 | Disposition: A | Discharge status: Home / Self Care | Payer: Medicaid Other | Attending: Emergency Medicine | Admitting: Emergency Medicine

## 2012-12-12 ENCOUNTER — Encounter (HOSPITAL_COMMUNITY): Payer: Self-pay | Admitting: *Deleted

## 2012-12-12 DIAGNOSIS — Z880 Allergy status to penicillin: Secondary | ICD-10-CM | POA: Insufficient documentation

## 2012-12-12 DIAGNOSIS — Z8742 Personal history of other diseases of the female genital tract: Secondary | ICD-10-CM | POA: Insufficient documentation

## 2012-12-12 DIAGNOSIS — S5010XA Contusion of unspecified forearm, initial encounter: Secondary | ICD-10-CM | POA: Insufficient documentation

## 2012-12-12 DIAGNOSIS — Z8719 Personal history of other diseases of the digestive system: Secondary | ICD-10-CM | POA: Insufficient documentation

## 2012-12-12 DIAGNOSIS — R110 Nausea: Secondary | ICD-10-CM | POA: Insufficient documentation

## 2012-12-12 DIAGNOSIS — Z8739 Personal history of other diseases of the musculoskeletal system and connective tissue: Secondary | ICD-10-CM | POA: Insufficient documentation

## 2012-12-12 DIAGNOSIS — I129 Hypertensive chronic kidney disease with stage 1 through stage 4 chronic kidney disease, or unspecified chronic kidney disease: Secondary | ICD-10-CM | POA: Insufficient documentation

## 2012-12-12 DIAGNOSIS — S5012XA Contusion of left forearm, initial encounter: Secondary | ICD-10-CM

## 2012-12-12 DIAGNOSIS — J45901 Unspecified asthma with (acute) exacerbation: Secondary | ICD-10-CM | POA: Insufficient documentation

## 2012-12-12 DIAGNOSIS — N189 Chronic kidney disease, unspecified: Secondary | ICD-10-CM | POA: Insufficient documentation

## 2012-12-12 DIAGNOSIS — R011 Cardiac murmur, unspecified: Secondary | ICD-10-CM | POA: Insufficient documentation

## 2012-12-12 DIAGNOSIS — Z8679 Personal history of other diseases of the circulatory system: Secondary | ICD-10-CM | POA: Insufficient documentation

## 2012-12-12 DIAGNOSIS — D649 Anemia, unspecified: Secondary | ICD-10-CM | POA: Insufficient documentation

## 2012-12-12 DIAGNOSIS — F411 Generalized anxiety disorder: Secondary | ICD-10-CM | POA: Insufficient documentation

## 2012-12-12 DIAGNOSIS — Y939 Activity, unspecified: Secondary | ICD-10-CM | POA: Insufficient documentation

## 2012-12-12 DIAGNOSIS — Z86711 Personal history of pulmonary embolism: Secondary | ICD-10-CM | POA: Insufficient documentation

## 2012-12-12 DIAGNOSIS — Z79899 Other long term (current) drug therapy: Secondary | ICD-10-CM | POA: Insufficient documentation

## 2012-12-12 DIAGNOSIS — J438 Other emphysema: Secondary | ICD-10-CM | POA: Insufficient documentation

## 2012-12-12 DIAGNOSIS — Z87891 Personal history of nicotine dependence: Secondary | ICD-10-CM | POA: Insufficient documentation

## 2012-12-12 DIAGNOSIS — W010XXA Fall on same level from slipping, tripping and stumbling without subsequent striking against object, initial encounter: Secondary | ICD-10-CM | POA: Insufficient documentation

## 2012-12-12 DIAGNOSIS — E079 Disorder of thyroid, unspecified: Secondary | ICD-10-CM | POA: Insufficient documentation

## 2012-12-12 DIAGNOSIS — G40909 Epilepsy, unspecified, not intractable, without status epilepticus: Secondary | ICD-10-CM | POA: Insufficient documentation

## 2012-12-12 DIAGNOSIS — F3289 Other specified depressive episodes: Secondary | ICD-10-CM | POA: Insufficient documentation

## 2012-12-12 DIAGNOSIS — R059 Cough, unspecified: Secondary | ICD-10-CM | POA: Insufficient documentation

## 2012-12-12 DIAGNOSIS — R05 Cough: Secondary | ICD-10-CM | POA: Insufficient documentation

## 2012-12-12 DIAGNOSIS — Y929 Unspecified place or not applicable: Secondary | ICD-10-CM | POA: Insufficient documentation

## 2012-12-12 DIAGNOSIS — Z7901 Long term (current) use of anticoagulants: Secondary | ICD-10-CM | POA: Insufficient documentation

## 2012-12-12 DIAGNOSIS — E119 Type 2 diabetes mellitus without complications: Secondary | ICD-10-CM | POA: Insufficient documentation

## 2012-12-12 DIAGNOSIS — F329 Major depressive disorder, single episode, unspecified: Secondary | ICD-10-CM | POA: Insufficient documentation

## 2012-12-12 DIAGNOSIS — Z91040 Latex allergy status: Secondary | ICD-10-CM | POA: Insufficient documentation

## 2012-12-12 DIAGNOSIS — E785 Hyperlipidemia, unspecified: Secondary | ICD-10-CM | POA: Insufficient documentation

## 2012-12-12 LAB — PROTIME-INR
INR: 3.44 — ABNORMAL HIGH (ref 0.00–1.49)
Prothrombin Time: 33.4 seconds — ABNORMAL HIGH (ref 11.6–15.2)

## 2012-12-12 MED ORDER — TRAMADOL HCL 50 MG PO TABS: 50.0000 mg | ORAL_TABLET | Freq: Four times a day (QID) | ORAL | Status: DC | PRN

## 2012-12-12 NOTE — ED Notes (Signed)
Pt states she tripped and fell. Pain and swelling to left lower arm/elbow area. Occurred 2 days ago.

## 2012-12-12 NOTE — ED Provider Notes (Signed)
History  This chart was scribed for Shelda Jakes, MD by Ardelia Mems, ED Scribe. This patient was seen in room APA17/APA17 and the patient's care was started at 3:17 PM.  CSN: [id number]  Arrival date & time 12/12/12  1443     Chief Complaint  Patient presents with  . Arm Injury    The history is provided by the patient. No language interpreter was used.   HPI Comments: [name] is a 67 y.o. female with a hx of HTN and DM who presents to the Emergency Department complaining of gradually worsening, constant, moderate left forearm and left elbow pain onset after a fall that occurred 2 days ago. Pt states that she tripped and fell over a shoe, landing with her left forearm and elbow on a bed railing. Pt states that she only injured her left forearm and elbow and she denies wrist injury, shoulder injury, abdominal pain, chest pain, back pain, neck pain, head injury, LOC or any other symptoms. She reports one episode of nausea earlier today which she attributes to her pain. She denies any episodes of emesis. Pt states that she has a cough and SOB, which are baseline for her, and are attributed to her COPD. Pt admits that she is on Coumadin. She denies fever, sore throat, chest pain, diarrhea, rash or any other symptoms. She denies alcohol use and is a former smoker.    PCP- Dr. Rudi Heap   Past Medical History  Diagnosis Date  . Hypertension   . Diabetes mellitus   . Arthritis   . Blood transfusion   . Hx pulmonary embolism   . Anemia   . COPD (chronic obstructive pulmonary disease)   . Depression   . Anxiety   . Gastroesophageal reflux disease   . Osteoporosis   . DDD (degenerative disc disease)   . Uterine prolapse   . Uterine prolapse   . Enlarged heart   . Asthma   . CHF (congestive heart failure)   . Emphysema of lung   . Heart murmur   . Hyperlipidemia   . Thyroid disease   . Shortness of breath   . Chronic kidney disease     lt kidney limited fxn  .  Seizures     once  . Headache(784.0)   . Dysrhythmia    Past Surgical History  Procedure Laterality Date  . Hiatel hernia repair    . Tubal ligation     Family History  Problem Relation Age of Onset  . Heart disease Mother   . Stroke Mother   . Prostate cancer Father   . Cancer Father     bone  . Liver cancer Brother   . Cancer Brother     leukemia  . Alzheimer's disease      family history  . Cancer Daughter     breast  . Cancer Paternal Uncle     unsure of what kind   History  Substance Use Topics  . Smoking status: Former Games developer  . Smokeless tobacco: Not on file     Comment: quit in 1996  . Alcohol Use: No   OB History   Grav Para Term Preterm Abortions TAB SAB Ect Mult Living                 Review of Systems  Constitutional: Negative for fever and chills.  HENT: Negative for congestion, sore throat, rhinorrhea and neck pain.   Eyes: Negative for visual disturbance.  Respiratory:  Positive for cough and shortness of breath.   Cardiovascular: Negative for chest pain and leg swelling.  Gastrointestinal: Positive for nausea. Negative for vomiting, abdominal pain and diarrhea.  Genitourinary: Negative for dysuria.  Musculoskeletal: Negative for back pain.  Skin: Negative for rash.  Neurological: Negative for syncope and headaches.  Hematological: Bruises/bleeds easily.  Psychiatric/Behavioral: Negative for confusion.  A complete 10 system review of systems was obtained and all systems are negative except as noted in the HPI and PMH.   Allergies  Procaine hcl; Codeine; Latex; and Penicillins  Home Medications   Current Outpatient Rx  Name  Route  Sig  Dispense  Refill  . albuterol (PROVENTIL HFA;VENTOLIN HFA) 108 (90 BASE) MCG/ACT inhaler   Inhalation   Inhale 2 puffs into the lungs every 6 (six) hours as needed. For shortness of breath   1 Inhaler   2   . amitriptyline (ELAVIL) 25 MG tablet   Oral   Take 25 mg by mouth at bedtime.           .  Choline Fenofibrate 135 MG capsule   Oral   Take 135 mg by mouth daily.         . diazepam (VALIUM) 10 MG tablet   Oral   Take 10 mg by mouth 3 (three) times daily.          Marland Kitchen escitalopram (LEXAPRO) 20 MG tablet   Oral   Take 20 mg by mouth every morning.          . fish oil-omega-3 fatty acids 1000 MG capsule   Oral   Take 1 g by mouth every morning.          . Folic Acid-Vit B6-Vit B12 (FA-VITAMIN B-6-VITAMIN B-12) 2.2-25-0.5 MG TABS   Oral   Take 1 tablet by mouth daily.         . furosemide (LASIX) 40 MG tablet   Oral   Take 0.5 tablets (20 mg total) by mouth daily.   30 tablet   2   . gabapentin (NEURONTIN) 300 MG capsule   Oral   Take 1 capsule (300 mg total) by mouth at bedtime and may repeat dose one time if needed.   30 capsule   2   . glimepiride (AMARYL) 1 MG tablet   Oral   Take 1 tablet (1 mg total) by mouth daily before breakfast.   30 tablet   2   . ipratropium-albuterol (DUONEB) 0.5-2.5 (3) MG/3ML SOLN   Nebulization   Take 3 mLs by nebulization every 4 (four) hours as needed. For shortness of breath   360 mL   1   . levothyroxine (SYNTHROID, LEVOTHROID) 88 MCG tablet   Oral   Take 1 tablet (88 mcg total) by mouth daily before breakfast.   30 tablet   3   . linagliptin (TRADJENTA) 5 MG TABS tablet   Oral   Take 1 tablet (5 mg total) by mouth daily.   30 tablet   2   . meclizine (ANTIVERT) 50 MG tablet   Oral   Take 50 mg by mouth 3 (three) times daily as needed for dizziness.         . metoprolol succinate (TOPROL-XL) 25 MG 24 hr tablet   Oral   Take 1 tablet (25 mg total) by mouth every morning.   30 tablet   2   . potassium chloride SA (K-DUR,KLOR-CON) 20 MEQ tablet   Oral   Take 0.5 tablets (10  mEq total) by mouth daily.   30 tablet   1   . simvastatin (ZOCOR) 40 MG tablet   Oral   Take 1 tablet (40 mg total) by mouth at bedtime.   30 tablet   3   . traMADol (ULTRAM) 50 MG tablet   Oral   Take 50 mg by  mouth every 8 (eight) hours as needed for pain.         Marland Kitchen warfarin (COUMADIN) 5 MG tablet   Oral   Take 0.5 tablets (2.5 mg total) by mouth every evening.   30 tablet   1    Triage Vitals: BP 125/43  Pulse 56  Temp(Src) 97.4 F (36.3 C) (Oral)  Resp 19  Ht 5\' 2"  (1.575 m)  Wt 192 lb 4 oz (87.204 kg)  BMI 35.15 kg/m2  SpO2 99%  Physical Exam  Nursing note and vitals reviewed. Constitutional: She is oriented to person, place, and time. She appears well-developed and well-nourished.  HENT:  Head: Normocephalic and atraumatic.  Eyes: EOM are normal. Pupils are equal, round, and reactive to light.  Neck: Normal range of motion. Neck supple.  Cardiovascular: Normal rate, regular rhythm, normal heart sounds and intact distal pulses.   No murmur heard. Left radial pulse is 2+.  Pulmonary/Chest: Effort normal and breath sounds normal. No respiratory distress. She has no wheezes.  Abdominal: Soft. Bowel sounds are normal. There is no tenderness.  Musculoskeletal: Normal range of motion.  There is bruising on the lateral aspect of the left elbow and proximal forearm. There are 2 old scabs, measuring about 1 cm each on the left arm.  Neurological: She is alert and oriented to person, place, and time.  Skin: Skin is warm and dry.  Psychiatric: She has a normal mood and affect. Her behavior is normal.    ED Course  Procedures (including critical care time)  DIAGNOSTIC STUDIES: Oxygen Saturation is 99% on RA, normal by my interpretation.    COORDINATION OF CARE: 3:43 PM- Pt advised of plan to receive an X-ray and pt agrees.   Labs Reviewed  PROTIME-INR - Abnormal; Notable for the following:    Prothrombin Time 33.4 (*)    INR 3.44 (*)    All other components within normal limits   Results for orders placed during the hospital encounter of 12/12/12  PROTIME-INR      Result Value Range   Prothrombin Time 33.4 (*) 11.6 - 15.2 seconds   INR 3.44 (*) 0.00 - 1.49     Dg  Elbow Complete Left  12/12/2012   *RADIOLOGY REPORT*  Clinical Data: Arm injury, posterior elbow bruising  LEFT ELBOW - COMPLETE 3+ VIEW  Comparison: 06/25/2012  Findings: No fracture or dislocation is seen.  The joint spaces are preserved.  Mild soft tissue swelling along the posterior forearm.  No displaced elbow joint fat pads to suggest an elbow joint effusion.  IMPRESSION: No fracture or dislocation is seen.   Original Report Authenticated By: Charline Bills, M.D.   Dg Forearm Left  12/12/2012   *RADIOLOGY REPORT*  Clinical Data: Arm injury, left forearm bruising  LEFT FOREARM - 2 VIEW  Comparison: None.  Findings: No fracture or dislocation is seen.  The joint spaces are preserved.  Mild posterolateral soft tissue swelling.  IMPRESSION: No fracture or dislocation is seen.   Original Report Authenticated By: Charline Bills, M.D.    1. Contusion of forearm, left, initial encounter     MDM  Patient without any  bony injuries to the forearm or left elbow. All soft tissue. Patient's INR is elevated at 3.44 patient will hold her dose of the Coumadin this evening and followup with her record Dr. next week.       I personally performed the services described in this documentation, which was scribed in my presence. The recorded information has been reviewed and is accurate.     Shelda Jakes, MD 12/12/12 [phone]

## 2012-12-25 ENCOUNTER — Encounter: Payer: Self-pay | Admitting: Cardiovascular Disease

## 2013-01-11 ENCOUNTER — Other Ambulatory Visit: Payer: Self-pay | Admitting: *Deleted

## 2013-01-11 MED ORDER — IPRATROPIUM-ALBUTEROL 0.5-2.5 (3) MG/3ML IN SOLN: 3.0000 mL | RESPIRATORY_TRACT | Status: DC | PRN

## 2013-01-12 ENCOUNTER — Telehealth: Payer: Self-pay | Admitting: Nurse Practitioner

## 2013-01-12 NOTE — Telephone Encounter (Signed)
appt made

## 2013-01-13 ENCOUNTER — Ambulatory Visit (INDEPENDENT_AMBULATORY_CARE_PROVIDER_SITE_OTHER): Payer: Medicaid Other | Admitting: General Practice

## 2013-01-13 ENCOUNTER — Encounter: Payer: Self-pay | Admitting: General Practice

## 2013-01-13 VITALS — BP 111/60 | HR 59 | Temp 97.4°F | Ht 62.0 in | Wt 194.0 lb

## 2013-01-13 DIAGNOSIS — R0602 Shortness of breath: Secondary | ICD-10-CM

## 2013-01-13 DIAGNOSIS — Z9981 Dependence on supplemental oxygen: Secondary | ICD-10-CM

## 2013-01-13 NOTE — Progress Notes (Addendum)
  Subjective:    Patient ID: [name], female    DOB: [birth year], 67 y.o.   MRN: [id number]  HPI Patient presents today for documentation for oxygen qualification. She reports currently using oxygen continuously at night and intermittent during the day 2L via nasal cannula. She reports shortness of breath with exertion.     Review of Systems  Respiratory: Positive for shortness of breath. Negative for chest tightness and wheezing.   Cardiovascular: Negative for chest pain and palpitations.  Genitourinary: Negative for difficulty urinating.  Neurological: Negative for dizziness, weakness and headaches.       Objective:   Physical Exam  Constitutional: She is oriented to person, place, and time. She appears well-developed and well-nourished.  Cardiovascular: Normal rate, regular rhythm and normal heart sounds.   Pulmonary/Chest: No respiratory distress. She exhibits no tenderness.  Diminished breath sounds in bases bilaterally. Shortness of breath upon exertion.   Neurological: She is alert and oriented to person, place, and time.  Skin: Skin is warm and dry.  Psychiatric: She has a normal mood and affect.          Assessment & Plan:  1. Requires supplemental oxygen -Oxygen saturation at rest on Room Air is 94% -Oxygen saturation on room air with exertion is 86% -Oxygen saturation during exertion, with 2 liters of oxygen via nasal cannula is 97% -discussed home oxygen use and importance  -DME oxygen referral -safety measures discussed while using oxygen -Patient verbalized understanding -Coralie Keens, FNP-C

## 2013-01-13 NOTE — Patient Instructions (Signed)
Oxygen Use at Home Oxygen can be prescribed for home use. The prescription will show the flow rate. This is how much oxygen is to be used per minute. This will be listed in liters per minute (LPM or L/M). A liter is a metric measurement of volume. You will use oxygen therapy as directed. It can be used while exercising, sleeping, or at rest. You may need oxygen continuously. Your caregiver may order a blood oxygen test (arterial blood gas or pulse oximetry test) that will show what your oxygen level is. Your caregiver will use these measurements to learn about your needs and follow your progress. Home oxygen therapy is commonly used on patients with various lung (pulmonary) related conditions. Some of these conditions include:  Asthma.  Lung cancer.  Pneumonia.  Emphysema.  Chronic bronchitis.  Cystic fibrosis.  Other lung diseases.  Pulmonary fibrosis.  Occupational lung disease.  Heart failure.  Chronic obstructive pulmonary disease (COPD). 3 COMMON WAYS OF PROVIDING OXYGEN THERAPY  Gas: The gas form of oxygen is put into variously sized cylinders or tanks. The cylinders or oxygen tanks contain compressed oxygen. The cylinder is equipped with a regulator that controls the flow rate. Because the flow of oxygen out of the cylinder is constant, an oxygen conserving device may be attached to the system to avoid waste. This device releases the gas only when you inhale and cuts it off when you exhale. Oxygen can be provided in a small cylinder that can be carried with you. Large tanks are heavy and are only for stationary use. After use, empty tanks must be exchanged for full tanks.  Liquid: The liquid form of oxygen is put into a container similar to a thermos. When released, the liquid converts to a gas and you breathe it in just like the compressed gas. This storage method takes up less space than the compressed gas cylinder, and you can transfer the liquid to a small, portable vessel at  home. Liquid oxygen is more expensive than the compressed gas, and the vessel vents when not in use. An oxygen conserving device may be built into the vessel to conserve the oxygen. Liquid oxygen is very cold, around 297 below zero.  Oxygen concentrator: This medical device filters oxygen from room air and gives almost 100% oxygen to the patient. Oxygen concentrators are powered by electricity. Benefits of this system are:  It does not need to be resupplied.  It is not as costly as liquid oxygen.  Extra tubing permits the user to move around easier. There are several types of small, portable oxygen systems available which can help you remain active and mobile. You must have a cylinder of oxygen as a backup in the event of a power failure. Advise your electric power company that you are on oxygen therapy in order to get priority service when there is a power failure. OXYGEN DELIVERY DEVICES There are 3 common ways to deliver oxygen to your body.  Nasal cannula. This is a 2-pronged device inserted in the nostrils that is connected to tubing carrying the oxygen. The tubing can rest on the ears or be attached to the frame of eyeglasses.  Mask. People who need a high flow of oxygen generally use a mask.  Transtracheal catheter. Transtracheal oxygen therapy requires the insertion of a small, flexible tube (catheter) in the windpipe (trachea). This catheter is held in place by a necklace. Since transtracheal oxygen bypasses the mouth, nose, and throat, a humidifier is absolutely required at flow  rates of 1 LPM or greater. SAFETY ISSUES  Never smoke while using oxygen.  Oxygen does not burn or explode, but materials will burn faster in the presence of oxygen.  Keep a Government social research officer close by. Let your fire department know that you have oxygen in your home.  Warn visitors not to smoke near you when you are using oxygen. Put up "no smoking" signs in your home where you most often use the  oxygen.  When you go to a restaurant with your portable oxygen source, ask to be seated in the nonsmoking section.  Stay at least 5 feet away from gas stoves, candles, lighted fireplaces, or other heat sources.  Do not use materials that burn easily (flammable) while using your oxygen.  If you use an oxygen cylinder, make sure it is secured to some fixed object or in a stand. If you use liquid oxygen, make sure the vessel is kept upright to keep the oxygen from pouring out. Liquid oxygen is so cold it can hurt your skin.  If you use an oxygen concentrator, call your electric company so you will be given priority if there is a power failure. Avoid using extension cords, if possible. GUIDELINES FOR CLEANING YOUR EQUIPMENT  Wash the nasal prongs with a liquid soap. Thoroughly rinse them once or twice a week.  Replace the prongs every 2 to 4 weeks. If you have an infection (cold, pneumonia) change them when you are well.  Your caregiver will give you instructions on how to clean your transtracheal catheter.  The humidifier bottle should be washed with soap and warm water and rinsed thoroughly between each refill. Air-dry the bottle before filling it with sterile or distilled water. The bottle and its top should be disinfected after they are cleaned.  If you use an oxygen concentrator, unplug the unit. Then wipe down the cabinet with a damp cloth and dry it daily. The air filter should be cleaned at least twice a week.  Follow your home medical equipment and service company's directions for cleaning the compressor filter. HOME CARE INSTRUCTIONS   Do not change the flow of oxygen unless directed by your caregiver.  Do not use alcohol or other sedating drugs unless instructed. They slow your breathing rate.  Do not use materials that burn easily (flammable) while using your oxygen.  Always keep a spare tank of oxygen. Plan ahead for holidays when you may not be able to get a prescription  filled.  Use water-based lubricants on your lips or nostrils. Do not use an oil-based product like petroleum jelly.  To prevent your cheeks or the skin behind your ears from becoming irritated, tuck some gauze under the tubing.  If you have persistent redness under your nose, call your caregiver.  When you no longer need oxygen, your doctor will have the oxygen discontinued. Oxygen is not addicting or habit-forming.  Use the oxygen as instructed. Too much oxygen can be harmful and too little will not give you the benefit you need.  Shortness of breath is not always from a lack of oxygen. If your oxygen level is not the cause of your shortness of breath, taking oxygen will not help. SEEK MEDICAL CARE IF:   You have frequent headaches.  You have shortness of breath or a lasting cough.  You have anxiety.  You are confused.  You are drowsy or sleepy all the time.  You develop an illness which aggravates your breathing.  You cannot exercise.  You  are restless.  You have blue lips or fingernails.  You have difficult or irregular breathing and it is getting worse.  You have an oral temperature above 102 F (38.9 C). Document Released: 08/17/2003 Document Revised: 08/19/2011 Document Reviewed: 04/23/2007 [facility] Patient Information 2014 [place], [place].

## 2013-01-19 ENCOUNTER — Encounter (HOSPITAL_COMMUNITY): Payer: Self-pay | Admitting: *Deleted

## 2013-01-19 ENCOUNTER — Emergency Department (HOSPITAL_COMMUNITY)
Admission: EM | Admit: 2013-01-19 | Discharge: 2013-01-19 | Disposition: A | Discharge status: Home / Self Care | Payer: Medicare HMO | Attending: Emergency Medicine | Admitting: Emergency Medicine

## 2013-01-19 ENCOUNTER — Emergency Department (HOSPITAL_COMMUNITY): Payer: Medicare HMO

## 2013-01-19 DIAGNOSIS — F411 Generalized anxiety disorder: Secondary | ICD-10-CM | POA: Insufficient documentation

## 2013-01-19 DIAGNOSIS — Z86711 Personal history of pulmonary embolism: Secondary | ICD-10-CM | POA: Insufficient documentation

## 2013-01-19 DIAGNOSIS — M129 Arthropathy, unspecified: Secondary | ICD-10-CM | POA: Insufficient documentation

## 2013-01-19 DIAGNOSIS — E119 Type 2 diabetes mellitus without complications: Secondary | ICD-10-CM | POA: Insufficient documentation

## 2013-01-19 DIAGNOSIS — D649 Anemia, unspecified: Secondary | ICD-10-CM | POA: Insufficient documentation

## 2013-01-19 DIAGNOSIS — F329 Major depressive disorder, single episode, unspecified: Secondary | ICD-10-CM | POA: Insufficient documentation

## 2013-01-19 DIAGNOSIS — J189 Pneumonia, unspecified organism: Secondary | ICD-10-CM | POA: Insufficient documentation

## 2013-01-19 DIAGNOSIS — I509 Heart failure, unspecified: Secondary | ICD-10-CM | POA: Insufficient documentation

## 2013-01-19 DIAGNOSIS — Z8679 Personal history of other diseases of the circulatory system: Secondary | ICD-10-CM | POA: Insufficient documentation

## 2013-01-19 DIAGNOSIS — F3289 Other specified depressive episodes: Secondary | ICD-10-CM | POA: Insufficient documentation

## 2013-01-19 DIAGNOSIS — I129 Hypertensive chronic kidney disease with stage 1 through stage 4 chronic kidney disease, or unspecified chronic kidney disease: Secondary | ICD-10-CM | POA: Insufficient documentation

## 2013-01-19 DIAGNOSIS — R109 Unspecified abdominal pain: Secondary | ICD-10-CM | POA: Insufficient documentation

## 2013-01-19 DIAGNOSIS — Z880 Allergy status to penicillin: Secondary | ICD-10-CM | POA: Insufficient documentation

## 2013-01-19 DIAGNOSIS — N189 Chronic kidney disease, unspecified: Secondary | ICD-10-CM | POA: Insufficient documentation

## 2013-01-19 DIAGNOSIS — Z8742 Personal history of other diseases of the female genital tract: Secondary | ICD-10-CM | POA: Insufficient documentation

## 2013-01-19 DIAGNOSIS — R5381 Other malaise: Secondary | ICD-10-CM | POA: Insufficient documentation

## 2013-01-19 DIAGNOSIS — R011 Cardiac murmur, unspecified: Secondary | ICD-10-CM | POA: Insufficient documentation

## 2013-01-19 DIAGNOSIS — Z91040 Latex allergy status: Secondary | ICD-10-CM | POA: Insufficient documentation

## 2013-01-19 DIAGNOSIS — R059 Cough, unspecified: Secondary | ICD-10-CM | POA: Insufficient documentation

## 2013-01-19 DIAGNOSIS — IMO0001 Reserved for inherently not codable concepts without codable children: Secondary | ICD-10-CM | POA: Insufficient documentation

## 2013-01-19 DIAGNOSIS — G40909 Epilepsy, unspecified, not intractable, without status epilepticus: Secondary | ICD-10-CM | POA: Insufficient documentation

## 2013-01-19 DIAGNOSIS — R509 Fever, unspecified: Secondary | ICD-10-CM | POA: Insufficient documentation

## 2013-01-19 DIAGNOSIS — R05 Cough: Secondary | ICD-10-CM | POA: Insufficient documentation

## 2013-01-19 DIAGNOSIS — E079 Disorder of thyroid, unspecified: Secondary | ICD-10-CM | POA: Insufficient documentation

## 2013-01-19 DIAGNOSIS — J45901 Unspecified asthma with (acute) exacerbation: Secondary | ICD-10-CM | POA: Insufficient documentation

## 2013-01-19 DIAGNOSIS — K219 Gastro-esophageal reflux disease without esophagitis: Secondary | ICD-10-CM | POA: Insufficient documentation

## 2013-01-19 DIAGNOSIS — G8929 Other chronic pain: Secondary | ICD-10-CM | POA: Insufficient documentation

## 2013-01-19 DIAGNOSIS — Z79899 Other long term (current) drug therapy: Secondary | ICD-10-CM | POA: Insufficient documentation

## 2013-01-19 DIAGNOSIS — J438 Other emphysema: Secondary | ICD-10-CM | POA: Insufficient documentation

## 2013-01-19 DIAGNOSIS — E785 Hyperlipidemia, unspecified: Secondary | ICD-10-CM | POA: Insufficient documentation

## 2013-01-19 DIAGNOSIS — Z87891 Personal history of nicotine dependence: Secondary | ICD-10-CM | POA: Insufficient documentation

## 2013-01-19 DIAGNOSIS — R110 Nausea: Secondary | ICD-10-CM | POA: Insufficient documentation

## 2013-01-19 DIAGNOSIS — Z8739 Personal history of other diseases of the musculoskeletal system and connective tissue: Secondary | ICD-10-CM | POA: Insufficient documentation

## 2013-01-19 LAB — CBC WITH DIFFERENTIAL/PLATELET
Basophils Relative: 0 % (ref 0–1)
Eosinophils Absolute: 0 10*3/uL (ref 0.0–0.7)
Eosinophils Relative: 0 % (ref 0–5)
HCT: 43.7 % (ref 36.0–46.0)
Hemoglobin: 14.1 g/dL (ref 12.0–15.0)
MCH: 29.9 pg (ref 26.0–34.0)
MCHC: 32.3 g/dL (ref 30.0–36.0)
Monocytes Absolute: 0.6 10*3/uL (ref 0.1–1.0)
Monocytes Relative: 6 % (ref 3–12)

## 2013-01-19 LAB — BASIC METABOLIC PANEL
BUN: 18 mg/dL (ref 6–23)
Creatinine, Ser: 1.3 mg/dL — ABNORMAL HIGH (ref 0.50–1.10)
GFR calc Af Amer: 49 mL/min — ABNORMAL LOW (ref >90)
GFR calc non Af Amer: 42 mL/min — ABNORMAL LOW (ref >90)

## 2013-01-19 LAB — URINE MICROSCOPIC-ADD ON

## 2013-01-19 LAB — URINALYSIS, ROUTINE W REFLEX MICROSCOPIC
Bilirubin Urine: NEGATIVE
Glucose, UA: NEGATIVE mg/dL
Ketones, ur: NEGATIVE mg/dL
pH: 6.5 (ref 5.0–8.0)

## 2013-01-19 MED ORDER — ACETAMINOPHEN 325 MG PO TABS
650.0000 mg | ORAL_TABLET | Freq: Once | ORAL | Status: AC
  Administered 2013-01-19: 650 mg via ORAL
  Filled 2013-01-19: qty 2

## 2013-01-19 MED ORDER — AZITHROMYCIN 250 MG PO TABS: ORAL_TABLET | ORAL | Status: DC

## 2013-01-19 NOTE — ED Notes (Signed)
Pt reports "not feeling good for several days", pt very vague in symptoms and stated she was "just hungry and wanted something to eat", advised will ask md about nourishment.

## 2013-01-19 NOTE — ED Provider Notes (Signed)
CSN: [id number]     Arrival date & time 01/19/13  1351 History     First MD Initiated Contact with Patient 01/19/13 1626     Chief Complaint  Patient presents with  . Generalized Body Aches  . Cough  . Fever   (Consider location/radiation/quality/duration/timing/severity/associated sxs/prior Treatment) HPI Comments: Pt complains of feeling unwell as of late with subjective fever/chills at home for 5 days, poor energy, 2 days of cough w/ dark phlegm, nausea.  She has chronic pain pain for several years, has been told she has a "bad gallbladder" that couldn't be operated on in the past because of her breathing.  Pain is not worse today, but reports BLUQ ab pain. She is not SOB currently, has not had CP for several days.  She denies vomiting, d/a, dysuria, frequency.  Husband had vom, d/a 2 days ago that resolved.   Patient is a 67 y.o. female presenting with cough, fever, and general illness. The history is provided by the patient and the spouse. No language interpreter was used.  Cough Associated symptoms: chills, fever and myalgias   Associated symptoms: no chest pain, no diaphoresis, no eye discharge, no headaches, no rash, no rhinorrhea, no shortness of breath, no sore throat and no wheezing   Fever Associated symptoms: chills, cough, myalgias and nausea   Associated symptoms: no chest pain, no confusion, no congestion, no diarrhea, no dysuria, no headaches, no rash, no rhinorrhea, no sore throat and no vomiting   Illness Location:  Generalized Severity:  Moderate Onset quality:  Gradual Duration:  5 days Timing:  Constant Progression:  Worsening Chronicity:  New Context:  Spontanesouly Relieved by:  Nothing Worsened by:  Nothing Ineffective treatments:  None tried Associated symptoms: abdominal pain, cough, fatigue, fever, myalgias and nausea   Associated symptoms: no chest pain, no congestion, no diarrhea, no headaches, no loss of consciousness, no rash, no rhinorrhea, no  shortness of breath, no sore throat, no vomiting and no wheezing     Past Medical History  Diagnosis Date  . Hypertension   . Diabetes mellitus   . Arthritis   . Blood transfusion   . Hx pulmonary embolism   . Anemia   . COPD (chronic obstructive pulmonary disease)   . Depression   . Anxiety   . Gastroesophageal reflux disease   . Osteoporosis   . DDD (degenerative disc disease)   . Uterine prolapse   . Uterine prolapse   . Enlarged heart   . Asthma   . CHF (congestive heart failure)   . Emphysema of lung   . Heart murmur   . Hyperlipidemia   . Thyroid disease   . Shortness of breath   . Chronic kidney disease     lt kidney limited fxn  . Seizures     once  . Headache(784.0)   . Dysrhythmia    Past Surgical History  Procedure Laterality Date  . Hiatel hernia repair    . Tubal ligation     Family History  Problem Relation Age of Onset  . Heart disease Mother   . Stroke Mother   . Prostate cancer Father   . Cancer Father     bone  . Liver cancer Brother   . Cancer Brother     leukemia  . Alzheimer's disease      family history  . Cancer Daughter     breast  . Cancer Paternal Uncle     unsure of what kind   History  Substance Use Topics  . Smoking status: Former Games developer  . Smokeless tobacco: Not on file     Comment: quit in 1996  . Alcohol Use: No   OB History   Grav Para Term Preterm Abortions TAB SAB Ect Mult Living                 Review of Systems  Constitutional: Positive for fever, chills, activity change, appetite change and fatigue. Negative for diaphoresis.  HENT: Negative for congestion, sore throat, facial swelling, rhinorrhea, neck pain and neck stiffness.   Eyes: Negative for photophobia and discharge.  Respiratory: Positive for cough. Negative for chest tightness, shortness of breath and wheezing.   Cardiovascular: Negative for chest pain, palpitations and leg swelling.  Gastrointestinal: Positive for nausea and abdominal pain.  Negative for vomiting and diarrhea.  Endocrine: Negative for polydipsia and polyuria.  Genitourinary: Negative for dysuria, frequency, difficulty urinating and pelvic pain.  Musculoskeletal: Positive for myalgias. Negative for back pain and arthralgias.  Skin: Negative for color change, rash and wound.  Allergic/Immunologic: Negative for immunocompromised state.  Neurological: Positive for weakness (generalized). Negative for loss of consciousness, facial asymmetry, numbness and headaches.  Hematological: Does not bruise/bleed easily.  Psychiatric/Behavioral: Negative for confusion and agitation.    Allergies  Procaine hcl; Codeine; Latex; and Penicillins  Home Medications   Current Outpatient Rx  Name  Route  Sig  Dispense  Refill  . albuterol (PROVENTIL HFA;VENTOLIN HFA) 108 (90 BASE) MCG/ACT inhaler   Inhalation   Inhale 2 puffs into the lungs every 6 (six) hours as needed. For shortness of breath   1 Inhaler   2   . amitriptyline (ELAVIL) 25 MG tablet   Oral   Take 25 mg by mouth at bedtime.           . Choline Fenofibrate 135 MG capsule   Oral   Take 135 mg by mouth daily.         . diazepam (VALIUM) 10 MG tablet   Oral   Take 10 mg by mouth 3 (three) times daily.          Marland Kitchen escitalopram (LEXAPRO) 20 MG tablet   Oral   Take 20 mg by mouth every morning.          . fish oil-omega-3 fatty acids 1000 MG capsule   Oral   Take 1 g by mouth every morning.          . Folic Acid-Vit B6-Vit B12 (FA-VITAMIN B-6-VITAMIN B-12) 2.2-25-0.5 MG TABS   Oral   Take 1 tablet by mouth daily.         . furosemide (LASIX) 40 MG tablet   Oral   Take 0.5 tablets (20 mg total) by mouth daily.   30 tablet   2   . gabapentin (NEURONTIN) 300 MG capsule   Oral   Take 1 capsule (300 mg total) by mouth at bedtime and may repeat dose one time if needed.   30 capsule   2   . glimepiride (AMARYL) 1 MG tablet   Oral   Take 1 tablet (1 mg total) by mouth daily before  breakfast.   30 tablet   2   . ipratropium-albuterol (DUONEB) 0.5-2.5 (3) MG/3ML SOLN   Nebulization   Take 3 mLs by nebulization every 4 (four) hours as needed. For shortness of breath   360 mL   0   . levothyroxine (SYNTHROID, LEVOTHROID) 88 MCG tablet   Oral   Take  1 tablet (88 mcg total) by mouth daily before breakfast.   30 tablet   3   . linagliptin (TRADJENTA) 5 MG TABS tablet   Oral   Take 1 tablet (5 mg total) by mouth daily.   30 tablet   2   . meclizine (ANTIVERT) 50 MG tablet   Oral   Take 50 mg by mouth 3 (three) times daily as needed for dizziness.         . metoprolol succinate (TOPROL-XL) 25 MG 24 hr tablet   Oral   Take 1 tablet (25 mg total) by mouth every morning.   30 tablet   2   . potassium chloride SA (K-DUR,KLOR-CON) 20 MEQ tablet   Oral   Take 0.5 tablets (10 mEq total) by mouth daily.   30 tablet   1   . simvastatin (ZOCOR) 40 MG tablet   Oral   Take 1 tablet (40 mg total) by mouth at bedtime.   30 tablet   3   . traMADol (ULTRAM) 50 MG tablet   Oral   Take 1 tablet (50 mg total) by mouth every 6 (six) hours as needed.   20 tablet   0   . warfarin (COUMADIN) 5 MG tablet   Oral   Take 0.5 tablets (2.5 mg total) by mouth every evening.   30 tablet   1   . azithromycin (ZITHROMAX Z-PAK) 250 MG tablet      2 po day one, then 1 daily x 4 days   5 tablet   0   . EXPIRED: pantoprazole (PROTONIX) 40 MG tablet   Oral   Take 1 tablet (40 mg total) by mouth daily at 6 (six) AM.   30 tablet   5    BP 108/48  Pulse 60  Temp(Src) 98.5 F (36.9 C) (Oral)  Resp 16  Ht 5\' 2"  (1.575 m)  Wt 184 lb (83.462 kg)  BMI 33.65 kg/m2  SpO2 93% Physical Exam  Constitutional: She is oriented to person, place, and time. She appears well-developed and well-nourished. No distress.  Diaphoretic, febrile  HENT:  Head: Normocephalic and atraumatic.  Mouth/Throat: No oropharyngeal exudate.  Eyes: Pupils are equal, round, and reactive to  light.  Neck: Normal range of motion. Neck supple.  Cardiovascular: Normal rate, regular rhythm and normal heart sounds.  Exam reveals no gallop and no friction rub.   No murmur heard. Pulmonary/Chest: Effort normal. No respiratory distress. She has no wheezes. Rales: slight crackles at BL bases.  Abdominal: Soft. Bowel sounds are normal. She exhibits no distension and no mass. There is no tenderness. There is no rigidity, no rebound and no guarding.    Musculoskeletal: Normal range of motion. She exhibits no edema and no tenderness.  Neurological: She is alert and oriented to person, place, and time.  Skin: Skin is warm and dry.  Psychiatric: She has a normal mood and affect.    ED Course   Procedures (including critical care time)  Labs Reviewed  CBC WITH DIFFERENTIAL - Abnormal; Notable for the following:    WBC 10.9 (*)    Neutrophils Relative % 87 (*)    Neutro Abs 9.5 (*)    Lymphocytes Relative 7 (*)    All other components within normal limits  BASIC METABOLIC PANEL - Abnormal; Notable for the following:    Glucose, Bld 142 (*)    Creatinine, Ser 1.30 (*)    GFR calc non Af Amer 42 (*)  GFR calc Af Amer 49 (*)    All other components within normal limits  GLUCOSE, CAPILLARY - Abnormal; Notable for the following:    Glucose-Capillary 106 (*)    All other components within normal limits  PROTIME-INR - Abnormal; Notable for the following:    Prothrombin Time 36.4 (*)    INR 3.85 (*)    All other components within normal limits  URINALYSIS, ROUTINE W REFLEX MICROSCOPIC - Abnormal; Notable for the following:    Specific Gravity, Urine <1.005 (*)    Hgb urine dipstick TRACE (*)    Leukocytes, UA TRACE (*)    All other components within normal limits  URINE MICROSCOPIC-ADD ON - Abnormal; Notable for the following:    Squamous Epithelial / LPF FEW (*)    All other components within normal limits  URINE CULTURE   Dg Chest 2 View  01/19/2013   *RADIOLOGY REPORT*   Clinical Data: Cough and fever  CHEST - 2 VIEW  Comparison: Nov 02, 2012  Findings: There is a stable calcified granuloma in the right mid lung.  Lungs elsewhere are clear.  Heart is mildly enlarged but stable with normal pulmonary vascularity.  No adenopathy.  No bone lesions.  IMPRESSION: Stable granuloma right mid lung.  No edema or consolidation. Mild stable cardiomegaly.   Original Report Authenticated By: Bretta Bang, M.D.   US Abdomen Limited Ruq  01/19/2013   *RADIOLOGY REPORT*  Clinical Data:  Abdominal pain with fever.  History of gallstones.  LIMITED ABDOMINAL ULTRASOUND - RIGHT UPPER QUADRANT  Comparison:  None.  Findings:  Gallbladder:  The gallbladder is moderately distended.  There are no stones and there is no wall thickening or pericholecystic fluid.  Common bile duct:  The common bile duct is normal in caliber with no stones.  It measures 4.7 mm in greatest diameter.  Liver:  The liver shows significant increased echogenicity.  No liver mass or focal lesion is seen.  The liver is normal in overall size.  There is hepatopetal flow in the portal vein.  IMPRESSION: Normal gallbladder.  No bile duct dilation.  Extensive fatty infiltration of the liver.                    Original Report Authenticated By: Amie Portland, M.D.   1. Atypical pneumonia     MDM  Pt is a 67 y.o. female with Pmhx as above who presents with 5 days of subjective fever, chills, fatigue, 2 days of cough with inc sputum, nose bleed this morning.  Pt has had had chronic ab pain and reports having a "bad gallbladder" that wasn't able to be taken out due to pt's respiratory status.  Pt febrile, ill, but non-tocix appearing on PE.  Slight crackles at bases.  She denies recent CP, breathing stable from baseline.  Abdominal pain present, though cronic w/ RUQ & LUQ ttp on exam w/o rebound or guarding. W/U shows WBC elevation, near baseline Cr, INR 3.85, CXR w/ stable right mid lung granuloma, mild stable cardiomegaly. RUQ [place]  w/ GB distension but no wall thickening, pericholecystic fluid or stones.  Urine not overtly infected.  Given, fever, cough, sputum production, I am concerned pt has early vs atypical pna.  BP stable, not hypoxic and tolerating PO.  Will d/c home w/ z-pack and strict return precautions for new of worsening symptoms, especially SOB.   1. Atypical pneumonia        Shanna Cisco, MD 01/19/13 2153

## 2013-01-19 NOTE — ED Notes (Signed)
Pt c/o chest pain, body aches, fever, coughing up blood, nausea that started 3 days ago, nose bleed last night,

## 2013-01-19 NOTE — ED Notes (Signed)
Pt returned from ultrasound. Ambulated to the bathroom to obtain urine specimen

## 2013-01-19 NOTE — ED Notes (Signed)
Pt requested to eat, md notified and asked that pt not eat at present, pt's husband brought food from [employer] in for pt, he moved it out of the pt's reach, she nodded understanding of no food at present. She is able drink. Pt has a drink at bedside.

## 2013-01-20 LAB — URINE CULTURE: Colony Count: 80000

## 2013-01-26 ENCOUNTER — Telehealth: Payer: Self-pay | Admitting: Nurse Practitioner

## 2013-01-26 ENCOUNTER — Encounter: Payer: Self-pay | Admitting: Family Medicine

## 2013-01-26 ENCOUNTER — Ambulatory Visit (INDEPENDENT_AMBULATORY_CARE_PROVIDER_SITE_OTHER): Payer: Medicare HMO | Admitting: Family Medicine

## 2013-01-26 VITALS — BP 117/57 | HR 52 | Temp 97.6°F | Wt 195.4 lb

## 2013-01-26 DIAGNOSIS — J189 Pneumonia, unspecified organism: Secondary | ICD-10-CM

## 2013-01-26 DIAGNOSIS — E039 Hypothyroidism, unspecified: Secondary | ICD-10-CM

## 2013-01-26 DIAGNOSIS — R5381 Other malaise: Secondary | ICD-10-CM

## 2013-01-26 DIAGNOSIS — I10 Essential (primary) hypertension: Secondary | ICD-10-CM

## 2013-01-26 DIAGNOSIS — E785 Hyperlipidemia, unspecified: Secondary | ICD-10-CM

## 2013-01-26 MED ORDER — LEVOFLOXACIN 500 MG PO TABS: 500.0000 mg | ORAL_TABLET | Freq: Every day | ORAL | Status: DC

## 2013-01-26 NOTE — Telephone Encounter (Signed)
appt made

## 2013-01-26 NOTE — Patient Instructions (Addendum)

## 2013-01-26 NOTE — Progress Notes (Signed)
  Subjective:    Patient ID: [name], female    DOB: Mar 17, 1946, 67 y.o.   MRN: [id number]  HPI This 67 y.o. female presents for evaluation of follow up from [facility] ED for pneumonia.  She was seen 01/19/13 for CAP and was tx'd with Zpak. She is still having mucopurulent resp secretions and feels bad.  She states She thinks she is having fever but is not taking her temp or measuring..   Review of Systems C/o cough, back pain, and mucopurulent sputum No chest pain, SOB, HA, dizziness, vision change, N/V, diarrhea, constipation, dysuria, urinary urgency or frequency, or rash.     Objective:   Physical Exam  Vital signs noted  Well developed well nourished female.  HEENT - Head atraumatic Normocephalic                Eyes - PERRLA, Conjuctiva - clear Sclera- Clear EOMI                Ears - EAC's Wnl TM's Wnl Gross Hearing WNL                Nose - Nares patent                 Throat - oropharanx wnl Respiratory - Lungs with rales left posterior base. Cardiac - RRR S1 and S2 without murmur GI - Abdomen soft Nontender and bowel sounds active x 4       Assessment & Plan:  Pneumonia - Plan: levofloxacin (LEVAQUIN) 500 MG tablet po qd x 10 days, mucinex otc, Push po fluids, tylenol and motrin otc as directed prn.  Follow up next week.

## 2013-02-02 ENCOUNTER — Ambulatory Visit (INDEPENDENT_AMBULATORY_CARE_PROVIDER_SITE_OTHER): Payer: Medicare HMO | Admitting: Family Medicine

## 2013-02-02 ENCOUNTER — Encounter: Payer: Self-pay | Admitting: Family Medicine

## 2013-02-02 VITALS — BP 135/63 | HR 61 | Temp 97.1°F | Ht 62.0 in | Wt 195.0 lb

## 2013-02-02 DIAGNOSIS — H60399 Other infective otitis externa, unspecified ear: Secondary | ICD-10-CM

## 2013-02-02 DIAGNOSIS — H60391 Other infective otitis externa, right ear: Secondary | ICD-10-CM

## 2013-02-02 DIAGNOSIS — J449 Chronic obstructive pulmonary disease, unspecified: Secondary | ICD-10-CM

## 2013-02-02 DIAGNOSIS — E119 Type 2 diabetes mellitus without complications: Secondary | ICD-10-CM

## 2013-02-02 DIAGNOSIS — E785 Hyperlipidemia, unspecified: Secondary | ICD-10-CM

## 2013-02-02 DIAGNOSIS — R5381 Other malaise: Secondary | ICD-10-CM

## 2013-02-02 LAB — POCT CBC
Granulocyte percent: 61 %G (ref 37–80)
Granulocyte percent: 61 %G (ref 37–80)
HCT, POC: 38.5 % (ref 37.7–47.9)
HCT, POC: 38.5 % (ref 37.7–47.9)
Hemoglobin: 12.7 g/dL (ref 12.2–16.2)
Hemoglobin: 12.7 g/dL (ref 12.2–16.2)
Lymph, poc: 2.1 (ref 0.6–3.4)
Lymph, poc: 2.1 (ref 0.6–3.4)
MCH, POC: 29.6 pg (ref 27–31.2)
MCH, POC: 29.6 pg (ref 27–31.2)
MCHC: 33 g/dL (ref 31.8–35.4)
MCHC: 33 g/dL (ref 31.8–35.4)
MCV: 89.6 fL (ref 80–97)
MCV: 89.6 fL (ref 80–97)
MPV: 8.8 fL (ref 0–99.8)
MPV: 8.8 fL (ref 0–99.8)
POC Granulocyte: 4 (ref 2–6.9)
POC Granulocyte: 4 (ref 2–6.9)
POC LYMPH PERCENT: 32.8 %L (ref 10–50)
POC LYMPH PERCENT: 32.8 %L (ref 10–50)
Platelet Count, POC: 198 10*3/uL (ref 142–424)
Platelet Count, POC: 198 10*3/uL (ref 142–424)
RBC: 4.3 M/uL (ref 4.04–5.48)
RBC: 4.3 M/uL (ref 4.04–5.48)
RDW, POC: 13.9 %
RDW, POC: 13.9 %
WBC: 6.5 10*3/uL (ref 4.6–10.2)
WBC: 6.5 10*3/uL (ref 4.6–10.2)

## 2013-02-02 LAB — POCT GLYCOSYLATED HEMOGLOBIN (HGB A1C): Hemoglobin A1C: 6.6

## 2013-02-02 MED ORDER — METHYLPREDNISOLONE (PAK) 4 MG PO TABS: ORAL_TABLET | ORAL | Status: DC

## 2013-02-02 MED ORDER — NEOMYCIN-POLYMYXIN-HC 3.5-10000-0.5 EX CREA: TOPICAL_CREAM | Freq: Two times a day (BID) | CUTANEOUS | Status: DC

## 2013-02-02 NOTE — Addendum Note (Signed)
Addended by: Monica Becton on: 02/02/2013 03:39 PM   Modules accepted: Orders

## 2013-02-02 NOTE — Patient Instructions (Signed)

## 2013-02-02 NOTE — Progress Notes (Signed)
  Subjective:    Patient ID: [name], female    DOB: 10-27-1945, 67 y.o.   MRN: [id number]  HPI This 67 y.o. female presents for evaluation of pneumonia.  She was seen in the ED and rx'd zpak and was still having Sx's and so she was seen a few days later last week and rx'd Levaquin 500mg  one po qd x 10 days and she has a few days Left.  She has hx of pulmonary edema and she has been on warfarin but had this held due taking abx's.  She has hx of  CoPD and she reports wearing oxygen at night.  She states she has been wearing oxygen at night for years.  Her oxygen Saturation on room air is 96%.  She uses duoneb tx's for her COPD.  She has hx of CAD and sees a cardiologist.     Review of Systems C/o otalgia, fatigue, SOB, and weakness. No chest pain, HA, dizziness, vision change, N/V, diarrhea, constipation, dysuria, urinary urgency or frequency, myalgias, arthralgias or rash.     Objective:   Physical Exam Vital signs noted  Well developed well nourished female.  HEENT - Head atraumatic Normocephalic                Eyes - PERRLA, Conjuctiva - clear Sclera- Clear EOMI                Ears - EAC's Wnl TM's Wnl Gross Hearing WNL                Nose - Nares patent                 Throat - oropharanx wnl Respiratory - Lungs CTA bilateral Cardiac - RRR S1 and S2 without murmur GI - Abdomen soft Nontender and bowel sounds active x 4 Extremities - No edema. Neuro - Grossly intact.       Assessment & Plan:  Otitis, externa, infective, right - Plan: methylPREDNIsolone (MEDROL DOSPACK) 4 MG tablet, neomycin-polymyxin-hydrocortisone (CORTISPORIN) 0.5 % cream  COPD (chronic obstructive pulmonary disease) - Plan: methylPREDNIsolone (MEDROL DOSPACK) 4 MG tablet  Diabetes - Plan: POCT CBC, CMP14+EGFR, POCT glycosylated hemoglobin (Hb A1C), Thyroid Panel With TSH, Sedimentation rate  Other malaise and fatigue - Plan: POCT CBC, CMP14+EGFR, POCT glycosylated hemoglobin (Hb A1C), Thyroid  Panel With TSH, Sedimentation rate  Other and unspecified hyperlipidemia - Plan: NMR, lipoprofile

## 2013-02-04 LAB — CMP14+EGFR
ALT: 14 IU/L (ref 0–32)
AST: 18 IU/L (ref 0–40)
Albumin/Globulin Ratio: 1.6 (ref 1.1–2.5)
Albumin: 4.2 g/dL (ref 3.6–4.8)
Alkaline Phosphatase: 34 IU/L — ABNORMAL LOW (ref 39–117)
BUN/Creatinine Ratio: 18 (ref 11–26)
BUN: 21 mg/dL (ref 8–27)
CO2: 29 mmol/L (ref 18–29)
Calcium: 9.6 mg/dL (ref 8.6–10.2)
Chloride: 100 mmol/L (ref 97–108)
Creatinine, Ser: 1.2 mg/dL — ABNORMAL HIGH (ref 0.57–1.00)
GFR calc Af Amer: 55 mL/min/{1.73_m2} — ABNORMAL LOW (ref >59)
GFR calc non Af Amer: 48 mL/min/{1.73_m2} — ABNORMAL LOW (ref >59)
Globulin, Total: 2.6 g/dL (ref 1.5–4.5)
Glucose: 48 mg/dL — ABNORMAL LOW (ref 65–99)
Potassium: 4 mmol/L (ref 3.5–5.2)
Sodium: 144 mmol/L (ref 134–144)
Total Bilirubin: 0.4 mg/dL (ref 0.0–1.2)
Total Protein: 6.8 g/dL (ref 6.0–8.5)

## 2013-02-04 LAB — NMR, LIPOPROFILE
Cholesterol: 134 mg/dL (ref <200)
HDL Cholesterol by NMR: 56 mg/dL (ref >=40)
HDL Particle Number: 45 umol/L (ref >=30.5)
LDL Particle Number: 954 nmol/L (ref <1000)
LDL Size: 20.7 nm (ref >20.5)
LDLC SERPL CALC-MCNC: 59 mg/dL (ref <100)
LP-IR Score: 60 — ABNORMAL HIGH (ref <=45)
Small LDL Particle Number: 629 nmol/L — ABNORMAL HIGH (ref <=527)
Triglycerides by NMR: 96 mg/dL (ref <150)

## 2013-02-04 LAB — THYROID PANEL WITH TSH
Free Thyroxine Index: 2.7 (ref 1.2–4.9)
T3 Uptake Ratio: 25 % (ref 24–39)
T4, Total: 10.7 ug/dL (ref 4.5–12.0)
TSH: 1.22 u[IU]/mL (ref 0.450–4.500)

## 2013-02-04 LAB — SEDIMENTATION RATE: Sed Rate: 19 mm/hr (ref 0–40)

## 2013-02-05 ENCOUNTER — Emergency Department (HOSPITAL_COMMUNITY): Payer: Medicare HMO

## 2013-02-05 ENCOUNTER — Emergency Department (HOSPITAL_COMMUNITY)
Admission: EM | Admit: 2013-02-05 | Discharge: 2013-02-05 | Disposition: A | Discharge status: Home / Self Care | Payer: Medicare HMO | Attending: Emergency Medicine | Admitting: Emergency Medicine

## 2013-02-05 ENCOUNTER — Ambulatory Visit (INDEPENDENT_AMBULATORY_CARE_PROVIDER_SITE_OTHER): Payer: Medicaid Other | Admitting: Family Medicine

## 2013-02-05 ENCOUNTER — Encounter (HOSPITAL_COMMUNITY): Payer: Self-pay | Admitting: *Deleted

## 2013-02-05 VITALS — BP 151/59 | HR 86 | Temp 101.3°F | Ht 62.0 in

## 2013-02-05 DIAGNOSIS — Z8709 Personal history of other diseases of the respiratory system: Secondary | ICD-10-CM | POA: Insufficient documentation

## 2013-02-05 DIAGNOSIS — Z87891 Personal history of nicotine dependence: Secondary | ICD-10-CM | POA: Insufficient documentation

## 2013-02-05 DIAGNOSIS — Z7901 Long term (current) use of anticoagulants: Secondary | ICD-10-CM | POA: Insufficient documentation

## 2013-02-05 DIAGNOSIS — F411 Generalized anxiety disorder: Secondary | ICD-10-CM | POA: Insufficient documentation

## 2013-02-05 DIAGNOSIS — Z792 Long term (current) use of antibiotics: Secondary | ICD-10-CM | POA: Insufficient documentation

## 2013-02-05 DIAGNOSIS — Z91040 Latex allergy status: Secondary | ICD-10-CM | POA: Insufficient documentation

## 2013-02-05 DIAGNOSIS — K219 Gastro-esophageal reflux disease without esophagitis: Secondary | ICD-10-CM | POA: Insufficient documentation

## 2013-02-05 DIAGNOSIS — I129 Hypertensive chronic kidney disease with stage 1 through stage 4 chronic kidney disease, or unspecified chronic kidney disease: Secondary | ICD-10-CM | POA: Insufficient documentation

## 2013-02-05 DIAGNOSIS — Z86711 Personal history of pulmonary embolism: Secondary | ICD-10-CM | POA: Insufficient documentation

## 2013-02-05 DIAGNOSIS — R011 Cardiac murmur, unspecified: Secondary | ICD-10-CM | POA: Insufficient documentation

## 2013-02-05 DIAGNOSIS — M129 Arthropathy, unspecified: Secondary | ICD-10-CM | POA: Insufficient documentation

## 2013-02-05 DIAGNOSIS — Z8742 Personal history of other diseases of the female genital tract: Secondary | ICD-10-CM | POA: Insufficient documentation

## 2013-02-05 DIAGNOSIS — I509 Heart failure, unspecified: Secondary | ICD-10-CM | POA: Insufficient documentation

## 2013-02-05 DIAGNOSIS — J189 Pneumonia, unspecified organism: Secondary | ICD-10-CM

## 2013-02-05 DIAGNOSIS — Z880 Allergy status to penicillin: Secondary | ICD-10-CM | POA: Insufficient documentation

## 2013-02-05 DIAGNOSIS — F3289 Other specified depressive episodes: Secondary | ICD-10-CM | POA: Insufficient documentation

## 2013-02-05 DIAGNOSIS — Z8679 Personal history of other diseases of the circulatory system: Secondary | ICD-10-CM | POA: Insufficient documentation

## 2013-02-05 DIAGNOSIS — R05 Cough: Secondary | ICD-10-CM | POA: Insufficient documentation

## 2013-02-05 DIAGNOSIS — D649 Anemia, unspecified: Secondary | ICD-10-CM | POA: Insufficient documentation

## 2013-02-05 DIAGNOSIS — Z8739 Personal history of other diseases of the musculoskeletal system and connective tissue: Secondary | ICD-10-CM | POA: Insufficient documentation

## 2013-02-05 DIAGNOSIS — E785 Hyperlipidemia, unspecified: Secondary | ICD-10-CM | POA: Insufficient documentation

## 2013-02-05 DIAGNOSIS — J449 Chronic obstructive pulmonary disease, unspecified: Secondary | ICD-10-CM | POA: Insufficient documentation

## 2013-02-05 DIAGNOSIS — R059 Cough, unspecified: Secondary | ICD-10-CM | POA: Insufficient documentation

## 2013-02-05 DIAGNOSIS — J159 Unspecified bacterial pneumonia: Secondary | ICD-10-CM | POA: Insufficient documentation

## 2013-02-05 DIAGNOSIS — N189 Chronic kidney disease, unspecified: Secondary | ICD-10-CM | POA: Insufficient documentation

## 2013-02-05 DIAGNOSIS — Z79899 Other long term (current) drug therapy: Secondary | ICD-10-CM | POA: Insufficient documentation

## 2013-02-05 DIAGNOSIS — E119 Type 2 diabetes mellitus without complications: Secondary | ICD-10-CM | POA: Insufficient documentation

## 2013-02-05 DIAGNOSIS — J4489 Other specified chronic obstructive pulmonary disease: Secondary | ICD-10-CM | POA: Insufficient documentation

## 2013-02-05 DIAGNOSIS — F329 Major depressive disorder, single episode, unspecified: Secondary | ICD-10-CM | POA: Insufficient documentation

## 2013-02-05 LAB — COMPREHENSIVE METABOLIC PANEL
ALT: 14 U/L (ref 0–35)
AST: 22 U/L (ref 0–37)
Alkaline Phosphatase: 30 U/L — ABNORMAL LOW (ref 39–117)
BUN: 20 mg/dL (ref 6–23)
Chloride: 100 mEq/L (ref 96–112)
GFR calc Af Amer: 63 mL/min — ABNORMAL LOW (ref >90)
GFR calc non Af Amer: 54 mL/min — ABNORMAL LOW (ref >90)
Total Protein: 7.4 g/dL (ref 6.0–8.3)

## 2013-02-05 LAB — POCT INFLUENZA A/B
Influenza A, POC: NEGATIVE
Influenza B, POC: NEGATIVE

## 2013-02-05 LAB — CBC WITH DIFFERENTIAL/PLATELET
Basophils Relative: 0 % (ref 0–1)
Eosinophils Absolute: 0 10*3/uL (ref 0.0–0.7)
Eosinophils Relative: 0 % (ref 0–5)
HCT: 39.6 % (ref 36.0–46.0)
Hemoglobin: 13.1 g/dL (ref 12.0–15.0)
MCH: 30.3 pg (ref 26.0–34.0)
MCHC: 33.1 g/dL (ref 30.0–36.0)
MCV: 91.5 fL (ref 78.0–100.0)
Monocytes Absolute: 0.8 10*3/uL (ref 0.1–1.0)
Monocytes Relative: 7 % (ref 3–12)

## 2013-02-05 LAB — URINALYSIS, ROUTINE W REFLEX MICROSCOPIC
Bilirubin Urine: NEGATIVE
Hgb urine dipstick: NEGATIVE
Ketones, ur: NEGATIVE mg/dL
Nitrite: NEGATIVE
Specific Gravity, Urine: 1.02 (ref 1.005–1.030)
pH: 5.5 (ref 5.0–8.0)

## 2013-02-05 MED ORDER — AZITHROMYCIN 250 MG PO TABS
500.0000 mg | ORAL_TABLET | Freq: Once | ORAL | Status: AC
  Administered 2013-02-05: 500 mg via ORAL
  Filled 2013-02-05: qty 2

## 2013-02-05 MED ORDER — ACETAMINOPHEN 500 MG PO TABS
1000.0000 mg | ORAL_TABLET | Freq: Once | ORAL | Status: AC
  Administered 2013-02-05: 1000 mg via ORAL
  Filled 2013-02-05: qty 2

## 2013-02-05 MED ORDER — AZITHROMYCIN 250 MG PO TABS: 250.0000 mg | ORAL_TABLET | Freq: Every day | ORAL | Status: DC

## 2013-02-05 NOTE — Patient Instructions (Signed)

## 2013-02-05 NOTE — Progress Notes (Signed)
  Subjective:    Patient ID: [name], female    DOB: 1945-12-12, 67 y.o.   MRN: [id number]  HPI This 67 y.o. female presents for evaluation of fever, shortness of breath, and follow up pneumonia. She was seen a few weeks ago in the ED for pneumonia and tx'd with zpak.  She followed up here A few days later on 01/26/13 and was put on levaquin.  She followed up for right ear pain and otitis externa and was put on medrol dose pack and she has worsened and now is SOB febrile and has DOE.   Review of Systems C/o fever, DOE, SOB No chest pain,  HA, dizziness, vision change, N/V, diarrhea, constipation, dysuria, urinary urgency or frequency, myalgias, arthralgias or rash.     Objective:   Physical Exam Vital signs noted  Acutely ill appearing female with SOB.  HEENT - Head atraumatic Normocephalic                Eyes - PERRLA, Conjuctiva - clear Sclera- Clear EOMI                Ears - EAC's Wnl TM's Wnl Gross Hearing WNL                Nose - Nares patent                 Throat - oropharanx wnl Respiratory - Lungs with crackles posterior left base. Cardiac - RRR S1 and S2 without murmur        Results for orders placed in visit on 02/05/13  POCT INFLUENZA A/B      Result Value Range   Influenza A, POC Negative     Influenza B, POC Negative     Assessment & Plan:  Cough - Plan: POCT Influenza A/B  Pneumonia - She is advised that she needs to go to hospital.  She wants to go to [facility]. Oxygen 2 liters [place] is applied and saturation comes up to 95%. She requests to go POV.  She is advised to go straight to ED.  [facility] called.

## 2013-02-05 NOTE — ED Provider Notes (Signed)
CSN: [id number]     Arrival date & time 02/05/13  1511 History   First MD Initiated Contact with Patient 02/05/13 1621     Chief Complaint  Patient presents with  . Fever   (Consider location/radiation/quality/duration/timing/severity/associated sxs/prior Treatment) HPI.... fever, cough for several days.  Patient is on home oxygen. She has a history of pneumonia. Exertion makes symptoms worse. Severity is moderate. No chest pain, dehydration.  Patient is eating.  Past Medical History  Diagnosis Date  . Hypertension   . Diabetes mellitus   . Arthritis   . Blood transfusion   . Hx pulmonary embolism   . Anemia   . COPD (chronic obstructive pulmonary disease)   . Depression   . Anxiety   . Gastroesophageal reflux disease   . Osteoporosis   . DDD (degenerative disc disease)   . Uterine prolapse   . Uterine prolapse   . Enlarged heart   . Asthma   . CHF (congestive heart failure)   . Emphysema of lung   . Heart murmur   . Hyperlipidemia   . Thyroid disease   . Shortness of breath   . Chronic kidney disease     lt kidney limited fxn  . Seizures     once  . Headache(784.0)   . Dysrhythmia    Past Surgical History  Procedure Laterality Date  . Hiatel hernia repair    . Tubal ligation     Family History  Problem Relation Age of Onset  . Heart disease Mother   . Stroke Mother   . Prostate cancer Father   . Cancer Father     bone  . Liver cancer Brother   . Cancer Brother     leukemia  . Alzheimer's disease      family history  . Cancer Daughter     breast  . Cancer Paternal Uncle     unsure of what kind   History  Substance Use Topics  . Smoking status: Former Games developer  . Smokeless tobacco: Not on file     Comment: quit in 1996  . Alcohol Use: No   OB History   Grav Para Term Preterm Abortions TAB SAB Ect Mult Living                 Review of Systems  All other systems reviewed and are negative.    Allergies  Procaine hcl; Codeine; Latex; and  Penicillins  Home Medications   Current Outpatient Rx  Name  Route  Sig  Dispense  Refill  . albuterol (PROVENTIL HFA;VENTOLIN HFA) 108 (90 BASE) MCG/ACT inhaler   Inhalation   Inhale 2 puffs into the lungs every 6 (six) hours as needed. For shortness of breath   1 Inhaler   2   . amitriptyline (ELAVIL) 25 MG tablet   Oral   Take 25 mg by mouth at bedtime.           . Choline Fenofibrate 135 MG capsule   Oral   Take 135 mg by mouth daily.         . diazepam (VALIUM) 10 MG tablet   Oral   Take 10 mg by mouth 3 (three) times daily.          Marland Kitchen escitalopram (LEXAPRO) 20 MG tablet   Oral   Take 20 mg by mouth every morning.          . fish oil-omega-3 fatty acids 1000 MG capsule   Oral  Take 1 g by mouth every morning.          . Folic Acid-Vit B6-Vit B12 (FA-VITAMIN B-6-VITAMIN B-12) 2.2-25-0.5 MG TABS   Oral   Take 1 tablet by mouth daily.         . furosemide (LASIX) 40 MG tablet   Oral   Take 0.5 tablets (20 mg total) by mouth daily.   30 tablet   2   . gabapentin (NEURONTIN) 300 MG capsule   Oral   Take 1 capsule (300 mg total) by mouth at bedtime and may repeat dose one time if needed.   30 capsule   2   . glimepiride (AMARYL) 1 MG tablet   Oral   Take 1 tablet (1 mg total) by mouth daily before breakfast.   30 tablet   2   . ipratropium-albuterol (DUONEB) 0.5-2.5 (3) MG/3ML SOLN   Nebulization   Take 3 mLs by nebulization every 4 (four) hours as needed. For shortness of breath   360 mL   0   . levothyroxine (SYNTHROID, LEVOTHROID) 88 MCG tablet   Oral   Take 1 tablet (88 mcg total) by mouth daily before breakfast.   30 tablet   3   . linagliptin (TRADJENTA) 5 MG TABS tablet   Oral   Take 1 tablet (5 mg total) by mouth daily.   30 tablet   2   . meclizine (ANTIVERT) 50 MG tablet   Oral   Take 50 mg by mouth 3 (three) times daily as needed for dizziness.         . methylPREDNISolone (MEDROL DOSEPAK) 4 MG tablet   Oral   Take  4-24 mg by mouth daily. follow package directions taking 6,5,4,3,2,1 as directed         . metoprolol succinate (TOPROL-XL) 25 MG 24 hr tablet   Oral   Take 1 tablet (25 mg total) by mouth every morning.   30 tablet   2   . neomycin-colistin-hydrocortisone-thonzonium (CORTISPORIN TC) 3.08-10-08-0.5 MG/ML otic suspension   Otic   Place 2 drops in ear(s) 4 (four) times daily.          . pantoprazole (PROTONIX) 40 MG tablet   Oral   Take 1 tablet (40 mg total) by mouth daily at 6 (six) AM.   30 tablet   5   . potassium chloride SA (K-DUR,KLOR-CON) 20 MEQ tablet   Oral   Take 20 mEq by mouth every morning.         . simvastatin (ZOCOR) 40 MG tablet   Oral   Take 1 tablet (40 mg total) by mouth at bedtime.   30 tablet   3   . traMADol (ULTRAM) 50 MG tablet   Oral   Take 1 tablet (50 mg total) by mouth every 6 (six) hours as needed.   20 tablet   0   . warfarin (COUMADIN) 5 MG tablet   Oral   Take 0.5 tablets (2.5 mg total) by mouth every evening.   30 tablet   1   . azithromycin (ZITHROMAX Z-PAK) 250 MG tablet   Oral   Take 1 tablet (250 mg total) by mouth daily.   4 each   0   . levofloxacin (LEVAQUIN) 500 MG tablet   Oral   Take 1 tablet (500 mg total) by mouth daily.   10 tablet   0    BP 130/85  Pulse 83  Temp(Src) 98.7 F (37.1 C) (Oral)  Resp 18  Ht 5\' 2"  (1.575 m)  Wt 192 lb (87.091 kg)  BMI 35.11 kg/m2  SpO2 93% Physical Exam  Nursing note and vitals reviewed. Constitutional: She is oriented to person, place, and time.  Good color.  Low pulse ox noted.    HENT:  Head: Normocephalic and atraumatic.  Eyes: Conjunctivae and EOM are normal. Pupils are equal, round, and reactive to light.  Neck: Normal range of motion. Neck supple.  Cardiovascular: Normal rate, regular rhythm and normal heart sounds.   Pulmonary/Chest: Effort normal and breath sounds normal.  Abdominal: Soft. Bowel sounds are normal.  Musculoskeletal: Normal range of motion.   Neurological: She is alert and oriented to person, place, and time.  Skin: Skin is warm and dry.  Psychiatric: She has a normal mood and affect.    ED Course  Procedures (including critical care time) Labs Review Labs Reviewed  CBC WITH DIFFERENTIAL - Abnormal; Notable for the following:    WBC 11.3 (*)    Neutrophils Relative % 84 (*)    Neutro Abs 9.5 (*)    Lymphocytes Relative 8 (*)    All other components within normal limits  COMPREHENSIVE METABOLIC PANEL - Abnormal; Notable for the following:    Potassium 3.2 (*)    Alkaline Phosphatase 30 (*)    GFR calc non Af Amer 54 (*)    GFR calc Af Amer 63 (*)    All other components within normal limits  URINALYSIS, ROUTINE W REFLEX MICROSCOPIC   Imaging Review Dg Chest 2 View  02/05/2013   *RADIOLOGY REPORT*  Clinical Data: Fever.  Pneumonia.  Weakness.  Fall.  CHEST - 2 VIEW  Comparison: 01/19/2013.  Findings: Calcified granuloma in the right midlung.  Mild cardiomegaly is present.  Chronic opacities present in the left cardiac apex, likely representing scarring is seen on prior chest CT. No pleural fluid is identified.  On the lateral view, there is density over the inferior lower thoracic spine, likely representing pneumonia or aspiration pneumonitis in the left lower lobe. Follow-up to ensure radiographic clearing recommended.  Clearing is usually observed at 8 weeks.  IMPRESSION: 1.  Chronic cardiomegaly without failure. 2. Patchy left lower lobe opacity consistent with pneumonia pneumonia. 3.  Old granulomatous disease.   Original Report Authenticated By: Andreas Newport, M.D.    MDM   1. Community acquired pneumonia    The patient appears well enough to be discharged.   Discussed diagnosis with patient and her husband.   Vital signs taken without nasal oxygen which the patient is normally on at home.  She feels able to be discharged.  Rx Zithromax    Donnetta Hutching, MD 02/05/13 2039

## 2013-02-08 ENCOUNTER — Telehealth: Payer: Self-pay | Admitting: Cardiology

## 2013-02-08 NOTE — Telephone Encounter (Signed)
Pt called saying she was seen in the ER a few days ago. She was concerned about her Coumadin dose (INR was 3 on 02/05/13). She was unable to tell me what dose of Coumadin she takes or who follows her INR. I believe it has been Dr Kandis Cocking office. I asked her to call the office in the morning when we would have access to her INR's. She is taking Coumadin now and I reassured her that her recent INR was therapeutic.  Corine Shelter PA-C 02/08/2013 7:07 PM

## 2013-02-09 ENCOUNTER — Other Ambulatory Visit: Payer: Self-pay

## 2013-02-09 MED ORDER — TRAMADOL HCL 50 MG PO TABS: 50.0000 mg | ORAL_TABLET | Freq: Four times a day (QID) | ORAL | Status: DC | PRN

## 2013-02-09 MED ORDER — ALBUTEROL SULFATE HFA 108 (90 BASE) MCG/ACT IN AERS: 2.0000 | INHALATION_SPRAY | Freq: Four times a day (QID) | RESPIRATORY_TRACT | Status: DC | PRN

## 2013-02-09 MED ORDER — WARFARIN SODIUM 5 MG PO TABS: 2.5000 mg | ORAL_TABLET | Freq: Every evening | ORAL | Status: DC

## 2013-02-09 MED ORDER — IPRATROPIUM-ALBUTEROL 0.5-2.5 (3) MG/3ML IN SOLN: 3.0000 mL | RESPIRATORY_TRACT | Status: DC | PRN

## 2013-02-09 MED ORDER — CHOLINE FENOFIBRATE 135 MG PO CPDR: 135.0000 mg | DELAYED_RELEASE_CAPSULE | Freq: Every day | ORAL | Status: DC

## 2013-02-09 MED ORDER — POTASSIUM CHLORIDE CRYS ER 20 MEQ PO TBCR: 20.0000 meq | EXTENDED_RELEASE_TABLET | Freq: Every morning | ORAL | Status: DC

## 2013-02-09 NOTE — Telephone Encounter (Signed)
Last seen 02/02/13  B [place]  If approved print and route to nurse

## 2013-02-09 NOTE — Telephone Encounter (Signed)
Last seen 02/02/13  B [place]  Last filled 12/12/12  If approved print and route to nurse

## 2013-02-10 ENCOUNTER — Encounter: Payer: Self-pay | Admitting: Family Medicine

## 2013-02-10 ENCOUNTER — Ambulatory Visit (INDEPENDENT_AMBULATORY_CARE_PROVIDER_SITE_OTHER): Payer: Medicare HMO | Admitting: Family Medicine

## 2013-02-10 VITALS — BP 114/59 | HR 57 | Temp 97.8°F | Ht 62.0 in | Wt 189.0 lb

## 2013-02-10 DIAGNOSIS — J441 Chronic obstructive pulmonary disease with (acute) exacerbation: Secondary | ICD-10-CM

## 2013-02-10 DIAGNOSIS — J189 Pneumonia, unspecified organism: Secondary | ICD-10-CM

## 2013-02-10 DIAGNOSIS — I2699 Other pulmonary embolism without acute cor pulmonale: Secondary | ICD-10-CM

## 2013-02-10 DIAGNOSIS — Z7901 Long term (current) use of anticoagulants: Secondary | ICD-10-CM

## 2013-02-10 MED ORDER — AZITHROMYCIN 250 MG PO TABS: ORAL_TABLET | ORAL | Status: DC

## 2013-02-10 NOTE — Progress Notes (Signed)
  Subjective:    Patient ID: [name], female    DOB: 07/17/1945, 67 y.o.   MRN: [id number]  HPI This 67 y.o. female presents for evaluation of follow up on recent hospital visit.  She  Is here from follow up on visit 02/05/13 where she was febrile, short of breath, hypoxic With saturation of 86% on room air and was sent to the ED.  Patient was put on nasal cannula oxygen In the office prior and sats increased to 96%.  She has been on oxygen at home for over 15 years. She has hx of Copd.  She was seen in the ED on 8/12 for CAP and tx with Zpak.  She followed up here on 01/26/13 and was placed on levaquin.  Her coumadin was held on 01/19/13 due to being elevated and starting ZPak.  She held coumadin until 02/02/13 and she resumed 1/2 tablet po qd of 5mg  tablet. She came back to office on 02/05/13 with worsening pneumonia.  She states she uses the oxygen on and off and usually only wears it at night.  She has been back on coumadin 5mg  po qd since 02/05/13. She states she will wear oxygen only when she needs it.  She feels a little better.  She is using walker And c/o DOE.   Review of Systems    No chest pain, SOB, HA, dizziness, vision change, N/V, diarrhea, constipation, dysuria, urinary urgency or frequency, myalgias, arthralgias or rash.  Objective:   Physical Exam Vital signs noted  Chronically ill appearing female.  HEENT - Head atraumatic Normocephalic                Eyes - PERRLA, Conjuctiva - clear Sclera- Clear EOMI                Ears - EAC's Wnl TM's Wnl Gross Hearing WNL                Nose - Nares patent                 Throat - oropharanx wnl Respiratory - Lungs Rales Left chest and clear right chest. Cardiac - RRR S1 and S2 without murmur GI - Abdomen soft Nontender and bowel sounds active x 4 Extremities - No edema. Neuro - Grossly intact.       Assessment & Plan:  PE. history of PE in 2007, NOW with NEW [employer] P.E.- most likely cause of troponin leak.08/14/11  -  Plan: POCT INR  Chronic anticoagulation, pt will need lifelong anticoagulation - Plan: POCT INR. Adjust coumadin to 1/2 5mg  tab m-w-f and one tab Tues, thurs, fri, sat, and sun and recheck ptinr in 2 weeks.  CAP (community acquired pneumonia) - Repeat Zpak and follow up in 2 weeks.  COPD exacerbation - Plan: Ambulatory referral to Pulmonology.  She has been on oxygen for  15 years and she has been having difficulty with SOB and pneumonia lately.

## 2013-02-10 NOTE — Patient Instructions (Addendum)
Pneumonia, Adult °Pneumonia is an infection of the lungs.  °CAUSES °Pneumonia may be caused by bacteria or a virus. Usually, these infections are caused by breathing infectious particles into the lungs (respiratory tract). °SYMPTOMS  °· Cough. °· Fever. °· Chest pain. °· Increased rate of breathing. °· Wheezing. °· Mucus production. °DIAGNOSIS  °If you have the common symptoms of pneumonia, your caregiver will typically confirm the diagnosis with a chest X-ray. The X-ray will show an abnormality in the lung (pulmonary infiltrate) if you have pneumonia. Other tests of your blood, urine, or sputum may be done to find the specific cause of your pneumonia. Your caregiver may also do tests (blood gases or pulse oximetry) to see how well your lungs are working. °TREATMENT  °Some forms of pneumonia may be spread to other people when you cough or sneeze. You may be asked to wear a mask before and during your exam. Pneumonia that is caused by bacteria is treated with antibiotic medicine. Pneumonia that is caused by the influenza virus may be treated with an antiviral medicine. Most other viral infections must run their course. These infections will not respond to antibiotics.  °PREVENTION °A pneumococcal shot (vaccine) is available to prevent a common bacterial cause of pneumonia. This is usually suggested for: °· People over 65 years old. °· Patients on chemotherapy. °· People with chronic lung problems, such as bronchitis or emphysema. °· People with immune system problems. °If you are over 65 or have a high risk condition, you may receive the pneumococcal vaccine if you have not received it before. In some countries, a routine influenza vaccine is also recommended. This vaccine can help prevent some cases of pneumonia. You may be offered the influenza vaccine as part of your care. °If you smoke, it is time to quit. You may receive instructions on how to stop smoking. Your caregiver can provide medicines and counseling to  help you quit. °HOME CARE INSTRUCTIONS  °· Cough suppressants may be used if you are losing too much rest. However, coughing protects you by clearing your lungs. You should avoid using cough suppressants if you can. °· Your caregiver may have prescribed medicine if he or she thinks your pneumonia is caused by a bacteria or influenza. Finish your medicine even if you start to feel better. °· Your caregiver may also prescribe an expectorant. This loosens the mucus to be coughed up. °· Only take over-the-counter or prescription medicines for pain, discomfort, or fever as directed by your caregiver. °· Do not smoke. Smoking is a common cause of bronchitis and can contribute to pneumonia. If you are a smoker and continue to smoke, your cough may last several weeks after your pneumonia has cleared. °· A cold steam vaporizer or humidifier in your room or home may help loosen mucus. °· Coughing is often worse at night. Sleeping in a semi-upright position in a recliner or using a couple pillows under your head will help with this. °· Get rest as you feel it is needed. Your body will usually let you know when you need to rest. °SEEK IMMEDIATE MEDICAL CARE IF:  °· Your illness becomes worse. This is especially true if you are elderly or weakened from any other disease. °· You cannot control your cough with suppressants and are losing sleep. °· You begin coughing up blood. °· You develop pain which is getting worse or is uncontrolled with medicines. °· You have a fever. °· Any of the symptoms which initially brought you in for treatment   are getting worse rather than better.  You develop shortness of breath or chest pain. MAKE SURE YOU:   Understand these instructions.  Will watch your condition.  Will get help right away if you are not doing well or get worse. Document Released: 05/27/2005 Document Revised: 08/19/2011 Document Reviewed: 08/16/2010 [facility] Patient Information 2014 [place], [place].  Anticoagulation  Dose Instructions as of 02/10/2013     [name] Tue Wed Thu Fri Sat   New Dose 5 mg 2.5 mg 5 mg 2.5 mg 5 mg 2.5 mg 5 mg    Description       Change coumadin dose to 5mg  tablet one half Monday, Wednesday, Friday And take 1 tablet Tuesday, Thursday, Saturday, and Sunday.   Follow up for INR in 2 weeks.

## 2013-02-19 ENCOUNTER — Ambulatory Visit (INDEPENDENT_AMBULATORY_CARE_PROVIDER_SITE_OTHER): Payer: Medicare HMO

## 2013-02-19 ENCOUNTER — Encounter: Payer: Self-pay | Admitting: General Practice

## 2013-02-19 ENCOUNTER — Ambulatory Visit (INDEPENDENT_AMBULATORY_CARE_PROVIDER_SITE_OTHER): Payer: Medicare HMO | Admitting: General Practice

## 2013-02-19 VITALS — BP 116/57 | HR 59 | Temp 97.4°F | Ht 62.0 in | Wt 193.0 lb

## 2013-02-19 DIAGNOSIS — Z8701 Personal history of pneumonia (recurrent): Secondary | ICD-10-CM

## 2013-02-19 NOTE — Progress Notes (Signed)
  Subjective:    Patient ID: [name], female    DOB: 1946/03/17, 67 y.o.   MRN: [id number]  HPI Patient presents today for 3 month follow up of chronic health conditions. She was asleep upon provider entering the room. Provider called patient by name twice before she raised her head and responded. Patient's husband entered room and informed patient had taken an extra valium this am.  Patient reports she was here for follow up from emergency room visit. She was diagnosed with pneumonia. She reports taking medications as directed.     Review of Systems  Constitutional: Negative for fever and chills.  HENT: Negative for neck pain and neck stiffness.   Respiratory: Negative for chest tightness, shortness of breath and wheezing.   Cardiovascular: Negative for chest pain and palpitations.  Gastrointestinal: Negative for vomiting, abdominal pain, diarrhea and blood in stool.  Genitourinary: Negative for hematuria and difficulty urinating.  Musculoskeletal: Negative for back pain.  Neurological: Positive for weakness.       Objective:   Physical Exam  Constitutional: She appears well-developed and well-nourished.  Cardiovascular: Normal rate, regular rhythm and normal heart sounds.   Pulmonary/Chest: Effort normal and breath sounds normal. No respiratory distress. She exhibits no tenderness.  Neurological:  Patient drowsy, but arousable  Skin: Skin is warm and dry.  Psychiatric: She has a normal mood and affect.   [facility] reading (PRIMARY) by Coralie Keens, FNP-C, no pneumonia noted.                                           Assessment & Plan:  1. History of pneumonia - DG Chest 2 View; Future -discussed medications that possibly caused increased drowsiness -safety and fall precautions discussed -Patient verbalized understanding Coralie Keens, FNP-C

## 2013-02-22 ENCOUNTER — Telehealth: Payer: Self-pay | Admitting: Nurse Practitioner

## 2013-02-22 ENCOUNTER — Ambulatory Visit: Payer: Self-pay | Admitting: Pharmacist Clinician (PhC)/ Clinical Pharmacy Specialist

## 2013-02-22 DIAGNOSIS — I2699 Other pulmonary embolism without acute cor pulmonale: Secondary | ICD-10-CM

## 2013-02-22 DIAGNOSIS — Z7901 Long term (current) use of anticoagulants: Secondary | ICD-10-CM

## 2013-02-23 ENCOUNTER — Emergency Department (HOSPITAL_COMMUNITY): Payer: Medicare HMO

## 2013-02-23 ENCOUNTER — Emergency Department (HOSPITAL_COMMUNITY)
Admission: EM | Admit: 2013-02-23 | Discharge: 2013-02-23 | Disposition: A | Discharge status: Home / Self Care | Payer: Medicare HMO | Attending: Emergency Medicine | Admitting: Emergency Medicine

## 2013-02-23 ENCOUNTER — Encounter (HOSPITAL_COMMUNITY): Payer: Self-pay

## 2013-02-23 DIAGNOSIS — Z91040 Latex allergy status: Secondary | ICD-10-CM | POA: Insufficient documentation

## 2013-02-23 DIAGNOSIS — D649 Anemia, unspecified: Secondary | ICD-10-CM | POA: Insufficient documentation

## 2013-02-23 DIAGNOSIS — I129 Hypertensive chronic kidney disease with stage 1 through stage 4 chronic kidney disease, or unspecified chronic kidney disease: Secondary | ICD-10-CM | POA: Insufficient documentation

## 2013-02-23 DIAGNOSIS — G40909 Epilepsy, unspecified, not intractable, without status epilepticus: Secondary | ICD-10-CM | POA: Insufficient documentation

## 2013-02-23 DIAGNOSIS — Z79899 Other long term (current) drug therapy: Secondary | ICD-10-CM | POA: Insufficient documentation

## 2013-02-23 DIAGNOSIS — J45909 Unspecified asthma, uncomplicated: Secondary | ICD-10-CM | POA: Insufficient documentation

## 2013-02-23 DIAGNOSIS — K219 Gastro-esophageal reflux disease without esophagitis: Secondary | ICD-10-CM | POA: Insufficient documentation

## 2013-02-23 DIAGNOSIS — F411 Generalized anxiety disorder: Secondary | ICD-10-CM | POA: Insufficient documentation

## 2013-02-23 DIAGNOSIS — E785 Hyperlipidemia, unspecified: Secondary | ICD-10-CM | POA: Insufficient documentation

## 2013-02-23 DIAGNOSIS — E119 Type 2 diabetes mellitus without complications: Secondary | ICD-10-CM | POA: Insufficient documentation

## 2013-02-23 DIAGNOSIS — Z87891 Personal history of nicotine dependence: Secondary | ICD-10-CM | POA: Insufficient documentation

## 2013-02-23 DIAGNOSIS — Z880 Allergy status to penicillin: Secondary | ICD-10-CM | POA: Insufficient documentation

## 2013-02-23 DIAGNOSIS — E079 Disorder of thyroid, unspecified: Secondary | ICD-10-CM | POA: Insufficient documentation

## 2013-02-23 DIAGNOSIS — J438 Other emphysema: Secondary | ICD-10-CM | POA: Insufficient documentation

## 2013-02-23 DIAGNOSIS — I809 Phlebitis and thrombophlebitis of unspecified site: Secondary | ICD-10-CM

## 2013-02-23 DIAGNOSIS — I8000 Phlebitis and thrombophlebitis of superficial vessels of unspecified lower extremity: Secondary | ICD-10-CM | POA: Insufficient documentation

## 2013-02-23 DIAGNOSIS — F3289 Other specified depressive episodes: Secondary | ICD-10-CM | POA: Insufficient documentation

## 2013-02-23 DIAGNOSIS — Z86711 Personal history of pulmonary embolism: Secondary | ICD-10-CM | POA: Insufficient documentation

## 2013-02-23 DIAGNOSIS — R011 Cardiac murmur, unspecified: Secondary | ICD-10-CM | POA: Insufficient documentation

## 2013-02-23 DIAGNOSIS — N189 Chronic kidney disease, unspecified: Secondary | ICD-10-CM | POA: Insufficient documentation

## 2013-02-23 DIAGNOSIS — F329 Major depressive disorder, single episode, unspecified: Secondary | ICD-10-CM | POA: Insufficient documentation

## 2013-02-23 DIAGNOSIS — Z792 Long term (current) use of antibiotics: Secondary | ICD-10-CM | POA: Insufficient documentation

## 2013-02-23 DIAGNOSIS — Z8739 Personal history of other diseases of the musculoskeletal system and connective tissue: Secondary | ICD-10-CM | POA: Insufficient documentation

## 2013-02-23 DIAGNOSIS — Z7901 Long term (current) use of anticoagulants: Secondary | ICD-10-CM | POA: Insufficient documentation

## 2013-02-23 DIAGNOSIS — I509 Heart failure, unspecified: Secondary | ICD-10-CM | POA: Insufficient documentation

## 2013-02-23 LAB — PROTIME-INR
INR: 4.79 — ABNORMAL HIGH (ref 0.00–1.49)
Prothrombin Time: 43 seconds — ABNORMAL HIGH (ref 11.6–15.2)

## 2013-02-23 NOTE — ED Notes (Signed)
Pt presents with c/o left lower leg pain x 2 days. Pt denies injury at this time. Pedal pulses present and strong. Negative homan's sign at this time.  NAD noted.

## 2013-02-23 NOTE — ED Notes (Signed)
Pt c/o left leg swelling and pain x 3 days.  Reports has been off of coumadin x 2 weeks.  Swelling noted, pedal pulse present, can wiggle toes, capillary refill wnl.

## 2013-02-23 NOTE — ED Provider Notes (Signed)
CSN: [id number]     Arrival date & time 02/23/13  1248 History   First MD Initiated Contact with Patient 02/23/13 1517     Chief Complaint  Patient presents with  . Leg Pain   (Consider location/radiation/quality/duration/timing/severity/associated sxs/prior Treatment) Patient is a 67 y.o. female presenting with leg pain. The history is provided by the patient (the pt complains of swelling and pain in left leg).  Leg Pain Location:  Leg Leg location:  L leg Pain details:    Quality:  Aching   Radiates to:  Does not radiate Associated symptoms: no back pain and no fatigue     Past Medical History  Diagnosis Date  . Hypertension   . Diabetes mellitus   . Arthritis   . Blood transfusion   . Hx pulmonary embolism   . Anemia   . COPD (chronic obstructive pulmonary disease)   . Depression   . Anxiety   . Gastroesophageal reflux disease   . Osteoporosis   . DDD (degenerative disc disease)   . Uterine prolapse   . Uterine prolapse   . Enlarged heart   . Asthma   . CHF (congestive heart failure)   . Emphysema of lung   . Heart murmur   . Hyperlipidemia   . Thyroid disease   . Shortness of breath   . Chronic kidney disease     lt kidney limited fxn  . Seizures     once  . Headache(784.0)   . Dysrhythmia    Past Surgical History  Procedure Laterality Date  . Hiatel hernia repair    . Tubal ligation     Family History  Problem Relation Age of Onset  . Heart disease Mother   . Stroke Mother   . Prostate cancer Father   . Cancer Father     bone  . Liver cancer Brother   . Cancer Brother     leukemia  . Alzheimer's disease      family history  . Cancer Daughter     breast  . Cancer Paternal Uncle     unsure of what kind   History  Substance Use Topics  . Smoking status: Former Games developer  . Smokeless tobacco: Not on file     Comment: quit in 1996  . Alcohol Use: No   OB History   Grav Para Term Preterm Abortions TAB SAB Ect Mult Living                  Review of Systems  Constitutional: Negative for appetite change and fatigue.  HENT: Negative for congestion, sinus pressure and ear discharge.   Eyes: Negative for discharge.  Respiratory: Negative for cough.   Cardiovascular: Negative for chest pain.  Gastrointestinal: Negative for abdominal pain and diarrhea.  Genitourinary: Negative for frequency and hematuria.  Musculoskeletal: Negative for back pain.       Swelling and tenderness in left leg  Skin: Negative for rash.  Neurological: Negative for seizures and headaches.  Psychiatric/Behavioral: Negative for hallucinations.    Allergies  Procaine hcl; Codeine; Latex; and Penicillins  Home Medications   Current Outpatient Rx  Name  Route  Sig  Dispense  Refill  . albuterol (PROVENTIL HFA;VENTOLIN HFA) 108 (90 BASE) MCG/ACT inhaler   Inhalation   Inhale 2 puffs into the lungs every 6 (six) hours as needed. For shortness of breath   1 Inhaler   5   . amitriptyline (ELAVIL) 25 MG tablet   Oral  Take 25 mg by mouth at bedtime.           . Choline Fenofibrate 135 MG capsule   Oral   Take 1 capsule (135 mg total) by mouth daily.   30 capsule   5   . diazepam (VALIUM) 10 MG tablet   Oral   Take 10 mg by mouth 3 (three) times daily.          Marland Kitchen escitalopram (LEXAPRO) 20 MG tablet   Oral   Take 20 mg by mouth every morning.          . fish oil-omega-3 fatty acids 1000 MG capsule   Oral   Take 1 g by mouth every morning.          . Folic Acid-Vit B6-Vit B12 (FA-VITAMIN B-6-VITAMIN B-12) 2.2-25-0.5 MG TABS   Oral   Take 1 tablet by mouth daily.         . furosemide (LASIX) 40 MG tablet   Oral   Take 0.5 tablets (20 mg total) by mouth daily.   30 tablet   2   . gabapentin (NEURONTIN) 300 MG capsule   Oral   Take 1 capsule (300 mg total) by mouth at bedtime and may repeat dose one time if needed.   30 capsule   2   . glimepiride (AMARYL) 1 MG tablet   Oral   Take 1 tablet (1 mg total) by mouth  daily before breakfast.   30 tablet   2   . levothyroxine (SYNTHROID, LEVOTHROID) 88 MCG tablet   Oral   Take 1 tablet (88 mcg total) by mouth daily before breakfast.   30 tablet   3   . linagliptin (TRADJENTA) 5 MG TABS tablet   Oral   Take 1 tablet (5 mg total) by mouth daily.   30 tablet   2   . metoprolol succinate (TOPROL-XL) 25 MG 24 hr tablet   Oral   Take 1 tablet (25 mg total) by mouth every morning.   30 tablet   2   . pantoprazole (PROTONIX) 40 MG tablet   Oral   Take 1 tablet (40 mg total) by mouth daily at 6 (six) AM.   30 tablet   5   . potassium chloride SA (K-DUR,KLOR-CON) 20 MEQ tablet   Oral   Take 1 tablet (20 mEq total) by mouth every morning.   30 tablet   5   . simvastatin (ZOCOR) 40 MG tablet   Oral   Take 1 tablet (40 mg total) by mouth at bedtime.   30 tablet   3   . warfarin (COUMADIN) 5 MG tablet   Oral   Take 0.5 tablets (2.5 mg total) by mouth every evening.   30 tablet   2   . ipratropium-albuterol (DUONEB) 0.5-2.5 (3) MG/3ML SOLN   Nebulization   Take 3 mLs by nebulization every 4 (four) hours as needed. For shortness of breath   360 mL   5   . meclizine (ANTIVERT) 50 MG tablet   Oral   Take 50 mg by mouth 3 (three) times daily as needed for dizziness.         . neomycin-colistin-hydrocortisone-thonzonium (CORTISPORIN TC) 3.08-10-08-0.5 MG/ML otic suspension   Otic   Place 2 drops in ear(s) 4 (four) times daily.          . traMADol (ULTRAM) 50 MG tablet   Oral   Take 1 tablet (50 mg total) by mouth every 6 (  six) hours as needed.   90 tablet   0    BP 101/60  Pulse 60  Temp(Src) 98.4 F (36.9 C) (Oral)  Resp 16  Ht 5\' 3"  (1.6 m)  Wt 180 lb (81.647 kg)  BMI 31.89 kg/m2  SpO2 94% Physical Exam  Constitutional: She is oriented to person, place, and time. She appears well-developed.  HENT:  Head: Normocephalic.  Eyes: Conjunctivae and EOM are normal. No scleral icterus.  Neck: Neck supple. No thyromegaly  present.  Cardiovascular: Normal rate and regular rhythm.  Exam reveals no gallop and no friction rub.   No murmur heard. Pulmonary/Chest: No stridor. She has no wheezes. She has no rales. She exhibits no tenderness.  Abdominal: She exhibits no distension. There is no tenderness. There is no rebound.  Musculoskeletal: Normal range of motion. She exhibits edema.  Swelling and tenderness left calf  Lymphadenopathy:    She has no cervical adenopathy.  Neurological: She is oriented to person, place, and time. Coordination normal.  Skin: No rash noted. No erythema.  Psychiatric: She has a normal mood and affect. Her behavior is normal.    ED Course  Procedures (including critical care time) Labs Review Labs Reviewed  PROTIME-INR - Abnormal; Notable for the following:    Prothrombin Time 43.0 (*)    INR 4.79 (*)    All other components within normal limits   Imaging Review US Venous Img Lower Unilateral Left  02/23/2013   CLINICAL DATA:  Left lower extremity pain and edema.  EXAM: VENOUS DOPPLER ULTRASOUND OF LEFT LOWER EXTREMITY  TECHNIQUE: Gray-scale sonography with graded compression, as well as color Doppler and duplex ultrasound, were performed to evaluate the deep venous system from the level of the common femoral vein through the popliteal and proximal calf veins. Spectral Doppler was utilized to evaluate flow at rest and with distal augmentation maneuvers.  COMPARISON:  None.  FINDINGS: Thrombus within deep veins:  None visualized.  Compressibility of deep veins:  Normal.  Duplex waveform respiratory phasicity:  Normal.  Duplex waveform response to augmentation:  Normal.  Venous reflux:  None visualized.  Superficial veins: There is evidence of thrombus in the short saphenous vein behind the knee. There is visible distension of the vein at this level. The great saphenous vein is normally patent.  IMPRESSION: No evidence of left lower extremity DVT. The study is positive for superficial  thrombophlebitis involving the short saphenous vein.   Electronically Signed   By: Irish Lack   On: 02/23/2013 14:46    MDM  Will hold coumadin tomorrow and keep leg elevated and have pt follow up in 2-3 days 1. Superficial phlebitis        Benny Lennert, MD 02/23/13 1840

## 2013-02-26 NOTE — Telephone Encounter (Signed)
Pt notified...made appt for pt to be seen on Tues w [name].

## 2013-03-02 ENCOUNTER — Ambulatory Visit (INDEPENDENT_AMBULATORY_CARE_PROVIDER_SITE_OTHER): Payer: Medicare HMO | Admitting: Nurse Practitioner

## 2013-03-02 ENCOUNTER — Encounter: Payer: Self-pay | Admitting: Nurse Practitioner

## 2013-03-02 VITALS — BP 102/54 | HR 52 | Temp 98.0°F | Ht 63.0 in | Wt 184.0 lb

## 2013-03-02 DIAGNOSIS — Z09 Encounter for follow-up examination after completed treatment for conditions other than malignant neoplasm: Secondary | ICD-10-CM

## 2013-03-02 DIAGNOSIS — I803 Phlebitis and thrombophlebitis of lower extremities, unspecified: Secondary | ICD-10-CM

## 2013-03-02 NOTE — Progress Notes (Signed)
  Subjective:    Patient ID: [name], female    DOB: 1945-11-12, 67 y.o.   MRN: [id number]  HPI Patient here today for ER follow up- Went to ER on 02/23/13 and was diagnosed with phlebitits and was given antibiotic- Improving- still has pain at times. Negative for DVT    Review of Systems  Respiratory: Negative.   Cardiovascular: Negative.   All other systems reviewed and are negative.       Objective:   Physical Exam  Constitutional: She is oriented to person, place, and time. She appears well-developed and well-nourished.  Cardiovascular: Normal rate, regular rhythm, normal heart sounds and intact distal pulses.  Exam reveals no gallop and no friction rub.   No murmur heard. Pulmonary/Chest: Effort normal and breath sounds normal.  Neurological: She is alert and oriented to person, place, and time.  Skin: Skin is warm and dry.   BP 102/54  Pulse 52  Temp(Src) 98 F (36.7 C) (Oral)  Ht 5\' 3"  (1.6 m)  Wt 184 lb (83.462 kg)  BMI 32.6 kg/m2        Assessment & Plan:   1. Hospital discharge follow-up   Phlebitis resolving  Continue antibiotics until finished Elevate legs when sitting RTO prn  Mary-Margaret Daphine Deutscher, FNP

## 2013-03-02 NOTE — Patient Instructions (Signed)
Phlebitis  Phlebitis is a redness, tenderness and soreness (inflammation) in a vein. This can occur in your arms, legs, or torso (trunk), as well as deeper inside your body.   CAUSES   Phlebitis can be triggered by multiple factors. These include:   Reduced (restricted) blood flow through your veins. This happens with prolonged bed rest, long distance travel, injury or surgery. Being overweight (obese) and pregnant can also restrict blood flow and lead to phlebitis.   Putting a catheter in the vein (intravenous or IV) and giving certain medications through in the vein (intravenously).   Cancer and cancer treatment.   Use of illegal intravenous drugs.   Inflammatory diseases.   Inherited (genetic) diseases that increase the risk for blood clots.   Hormone therapy (such as birth control pills).  SYMPTOMS    Red, tender, swollen, painful area on your skin.   Usually, the area will be long and narrow.   Low grade fever.   Significant firmness along the center of this area. This can indicate that a blood clot has formed.   Surrounding redness or a high fever, which can indicate an infection (cellulitis).  DIAGNOSIS    The appearance of your condition and your symptoms will cause your caregiver to suspect phlebitis. Usually, this is enough for a diagnosis.   Your caregiver may request blood tests or an ultrasound test of the area to be sure you do not have an infection or a blood clot. Blood tests and discussing your family history may also indicate if you have an underlying genetic disease that causes blood clots.   Occasionally, a piece of tissue is taken from the body (biopsy) if an unusual cause of phlebitis is suspected.  TREATMENT    Raise (elevate) the affected area above the level of the heart.   Apply a warm compress or heating pad for 20 minutes, 3 or 4 times a day. If you use an electric heating pad, follow the directions so you do not burn yourself.   Anti-inflammatory medications are usually  recommended. Follow your caregiver's directions.   Any IV catheter, if present, will be removed by your caregiver.   Your caregiver may prescribe medicines that kill germs (antibiotics) if an infection is present.   Your caregiver may recommend blood thinners if a blood clot is suspected or present.   Support stockings or bandages may be helpful, depending on the cause and location of the phlebitis.   Surgery may be needed to remove very damaged sections of vein, but this is rare.  HOME CARE INSTRUCTIONS    Take medications exactly as prescribed.   Follow up with your caregiver as directed.   Use support stockings or bandages if advised. These will speed healing and prevent recurrence.   If you are on blood thinners:   Do follow-up blood tests exactly as directed.   Check with your caregiver before using any new medications.   Wear a pendant to show that you are on blood thinners.   For phlebitis in the legs:   Avoid prolonged standing or bed rest.   Keep your legs moving. Raise your legs with sitting or lying.   Do not smoke.   Women, particularly those over the age of 35, should consider the risks and benefits of taking the contraceptive pill. This kind of hormone treatment can increase your risk for blood clots.  SEEK MEDICAL CARE IF:    You have unusual bruising or any bleeding problems.   Swelling   or pain in your affected arm or leg is not gradually improving.   You are on anti-inflammatory medication and you develop belly (abdominal) pain.  SEEK IMMEDIATE MEDICAL CARE IF:    An unexplained oral temperature above 100.5 F (38.1 C) develops.   You have sudden onset of chest pain or difficulty breathing.  Document Released: 05/21/2001 Document Revised: 08/19/2011 Document Reviewed: 02/20/2009  [employer] Patient Information 2014 [employer]

## 2013-03-09 ENCOUNTER — Other Ambulatory Visit: Payer: Self-pay

## 2013-03-09 MED ORDER — GABAPENTIN 300 MG PO CAPS: 300.0000 mg | ORAL_CAPSULE | Freq: Every evening | ORAL | Status: DC | PRN

## 2013-03-09 MED ORDER — FUROSEMIDE 40 MG PO TABS: 20.0000 mg | ORAL_TABLET | Freq: Every day | ORAL | Status: DC

## 2013-03-09 MED ORDER — METOPROLOL SUCCINATE ER 25 MG PO TB24: 25.0000 mg | ORAL_TABLET | Freq: Every morning | ORAL | Status: DC

## 2013-03-09 MED ORDER — GLIMEPIRIDE 1 MG PO TABS: 1.0000 mg | ORAL_TABLET | Freq: Every day | ORAL | Status: DC

## 2013-03-15 ENCOUNTER — Telehealth (HOSPITAL_COMMUNITY): Payer: Self-pay | Admitting: *Deleted

## 2013-03-23 ENCOUNTER — Ambulatory Visit (INDEPENDENT_AMBULATORY_CARE_PROVIDER_SITE_OTHER): Payer: Medicare HMO | Admitting: Pharmacist Clinician (PhC)/ Clinical Pharmacy Specialist

## 2013-03-23 ENCOUNTER — Encounter: Payer: Self-pay | Admitting: Cardiology

## 2013-03-23 ENCOUNTER — Ambulatory Visit (INDEPENDENT_AMBULATORY_CARE_PROVIDER_SITE_OTHER): Payer: Medicare HMO | Admitting: Cardiology

## 2013-03-23 VITALS — BP 142/90 | Ht 62.0 in | Wt 195.4 lb

## 2013-03-23 DIAGNOSIS — I2789 Other specified pulmonary heart diseases: Secondary | ICD-10-CM

## 2013-03-23 DIAGNOSIS — R002 Palpitations: Secondary | ICD-10-CM

## 2013-03-23 DIAGNOSIS — R072 Precordial pain: Secondary | ICD-10-CM

## 2013-03-23 DIAGNOSIS — I2699 Other pulmonary embolism without acute cor pulmonale: Secondary | ICD-10-CM

## 2013-03-23 DIAGNOSIS — E785 Hyperlipidemia, unspecified: Secondary | ICD-10-CM

## 2013-03-23 DIAGNOSIS — E669 Obesity, unspecified: Secondary | ICD-10-CM

## 2013-03-23 DIAGNOSIS — I10 Essential (primary) hypertension: Secondary | ICD-10-CM

## 2013-03-23 DIAGNOSIS — Z7901 Long term (current) use of anticoagulants: Secondary | ICD-10-CM

## 2013-03-23 MED ORDER — LOSARTAN POTASSIUM 25 MG PO TABS: 25.0000 mg | ORAL_TABLET | Freq: Every day | ORAL | Status: DC

## 2013-03-23 NOTE — Patient Instructions (Signed)
Dr. Herbie Baltimore has made the following medication changes: STOP taking Metoprolol Succinate (Toprol XL) 25 mg.  START taking Losartan 25 mg once daily.  We will have the [place] office call you to set up an appointment to check your INR.  Dr. Herbie Baltimore wants you to follow-up in 6 months. You will receive a reminder letter in the mail two months in advance. If you don't receive a letter, please call our office to schedule the follow-up appointment.

## 2013-03-23 NOTE — Progress Notes (Signed)
PATIENT: [name] MRN: [id number]  DOB: 1946-03-04   DOV:03/25/2013 PCP: Rudi Heap, MD  Clinic Note: Chief Complaint  Patient presents with  . Establish Care    RAW-DH. Chest pain underneath the left breast, occasional abdominal pain, palpitations, claudication and edema, lightheadedness and unsteady gait.    HPI: [name] is a 67 y.o. female with a PMH below who presents today for three-month followup. She is a former patient of Dr. Alanda Amass, who is now establishing cardiology care with me. She basically has a distant history of smoking with long-standing COPD/emphysema. She has a history of PE in 2007 followed by a massive PE in 2013. Initially she had some elevated pulmonary pressures, but not seen on a followup echocardiogram later on that year. Her EF has been preserved and 50-60% range. She has exogenous obesity, diabetes, hypertension and hyperlipidemia. She's been on chronic anticoagulation with warfarin, she has had a history of anemia and presumptive GI bleed in the summer of 2007. Her iron ours been followed as far as I can tell by her primary physician in [facility], but she is not sure what his post be checked.  Interval History: He presents today with multiple symptoms including palpitations, or state gait which seems to be a chronic thing for her. She also has some leg discomfort with walking. It sounds potentially like claudication. She also is mild edema. She notes some sharp discomfort in her chest underneath her left breast as well as up along the left sternal margin. She has chronic dyspnea from her COPD, but no PND orthopnea. Her dyspnea is usually exertional. She has had some intermittent palpitations that are really describe his just hard heartbeats. Nothing sustained.  The remainder of Cardiovascular ROS: positive for - chest pain, dyspnea on exertion, edema, palpitations and shortness of breath negative for - loss of consciousness, orthopnea or  paroxysmal nocturnal dyspnea: Additional cardiac review of systems: Lightheadedness - no, dizziness - no, syncope/near-syncope - no; TIA/amaurosis fugax - no Melena - no, hematochezia no; hematuria - no; nosebleeds - no; claudication - no  Past Medical History  Diagnosis Date  . Hypertension   . Diabetes mellitus   . Arthritis   . Blood transfusion 2007    History of  . PE (pulmonary embolism) February 2007; March 2013    Bilateral PEs in 07; massive PE March 2013 with moderate elevation in PA pressures.  . Anemia   . COPD (chronic obstructive pulmonary disease) with emphysema   . Depression   . Anxiety   . Gastroesophageal reflux disease   . Osteoporosis   . DDD (degenerative disc disease)   . Uterine prolapse   . Asthma   . History of CHF (congestive heart failure) 2007    2013: Normal EF by echo x2 with no significant diastolic dysfunction noted either  . Heart murmur     No significant valvular lesions on echo  . Hyperlipidemia   . Hypothyroidism (acquired)     On Levothyroxine  . Chronic kidney disease     lt kidney limited fxn  . Seizures     once  . Headache(784.0)   . Heart palpitations     Prior Cardiac Evaluation and Past Surgical History: Past Surgical History  Procedure Laterality Date  . Hiatel hernia repair    . Tubal ligation    . Cardiac catheterization  March 2013    No evidence of coronary disease; also no significant aortic or iliac or renal artery disease.  Allergies  Allergen Reactions  . Procaine Hcl Anaphylaxis  . Codeine     "knocks me out" - states it is too strong  . Latex Hives and Rash  . Penicillins Rash    Current Outpatient Prescriptions  Medication Sig Dispense Refill  . albuterol (PROVENTIL HFA;VENTOLIN HFA) 108 (90 BASE) MCG/ACT inhaler Inhale 2 puffs into the lungs every 6 (six) hours as needed. For shortness of breath  1 Inhaler  5  . amitriptyline (ELAVIL) 25 MG tablet Take 25 mg by mouth at bedtime.        . Choline  Fenofibrate 135 MG capsule Take 1 capsule (135 mg total) by mouth daily.  30 capsule  5  . diazepam (VALIUM) 10 MG tablet Take 10 mg by mouth 3 (three) times daily.       Marland Kitchen escitalopram (LEXAPRO) 20 MG tablet Take 20 mg by mouth every morning.       . fish oil-omega-3 fatty acids 1000 MG capsule Take 1 g by mouth every morning.       . Folic Acid-Vit B6-Vit B12 (FA-VITAMIN B-6-VITAMIN B-12) 2.2-25-0.5 MG TABS Take 1 tablet by mouth daily.      . furosemide (LASIX) 40 MG tablet Take 0.5 tablets (20 mg total) by mouth daily.  30 tablet  4  . gabapentin (NEURONTIN) 300 MG capsule Take 1 capsule (300 mg total) by mouth at bedtime and may repeat dose one time if needed.  30 capsule  2  . glimepiride (AMARYL) 1 MG tablet Take 1 tablet (1 mg total) by mouth daily before breakfast.  30 tablet  1  . ipratropium-albuterol (DUONEB) 0.5-2.5 (3) MG/3ML SOLN Take 3 mLs by nebulization every 4 (four) hours as needed. For shortness of breath  360 mL  5  . levothyroxine (SYNTHROID, LEVOTHROID) 88 MCG tablet Take 1 tablet (88 mcg total) by mouth daily before breakfast.  30 tablet  3  . linagliptin (TRADJENTA) 5 MG TABS tablet Take 1 tablet (5 mg total) by mouth daily.  30 tablet  2  . meclizine (ANTIVERT) 50 MG tablet Take 50 mg by mouth 3 (three) times daily as needed for dizziness.      . neomycin-colistin-hydrocortisone-thonzonium (CORTISPORIN TC) 3.08-10-08-0.5 MG/ML otic suspension Place 2 drops in ear(s) 4 (four) times daily.       . pantoprazole (PROTONIX) 40 MG tablet Take 1 tablet (40 mg total) by mouth daily at 6 (six) AM.  30 tablet  5  . potassium chloride SA (K-DUR,KLOR-CON) 20 MEQ tablet Take 1 tablet (20 mEq total) by mouth every morning.  30 tablet  5  . simvastatin (ZOCOR) 40 MG tablet Take 1 tablet (40 mg total) by mouth at bedtime.  30 tablet  3  . traMADol (ULTRAM) 50 MG tablet Take 1 tablet (50 mg total) by mouth every 6 (six) hours as needed.  90 tablet  0  . warfarin (COUMADIN) 5 MG tablet Take  0.5 tablets (2.5 mg total) by mouth every evening.  30 tablet  2  . losartan (COZAAR) 25 MG tablet Take 1 tablet (25 mg total) by mouth daily.  30 tablet  6  . [DISCONTINUED] metFORMIN (GLUCOPHAGE) 500 MG tablet Take 500 mg by mouth 2 (two) times daily.        No current facility-administered medications for this visit.    History   Social History Narrative   She is a married mother of 6, she has raised one 42 year old grandson. She was a former smoker of  at least 50-pack-years but quit in 2006. She recently purchased an exercise bicycle, but is not been actively using it. She is trying to become more active. She denies any active alcohol use.   Her husband was recently diagnosed with urologic cancer, so she is somewhat "frazzled in her overall demeanor.)    ROS: A comprehensive Review of Systems - Negative except Pertinent positives in history of present illness as well as other symptoms noted below. General ROS: positive for  - fatigue, sleep disturbance and weight gain negative for - fever, hot flashes, malaise or night sweats Psychological ROS: positive for - anxiety, memory difficulties and sleep disturbances Respiratory ROS: positive for - cough, wheezing and But with no productive cough. Musculoskeletal ROS: Chronic DJD pains of her hips back knees.  PHYSICAL EXAM BP 142/90  Ht 5\' 2"  (1.575 m)  Wt 195 lb 6.4 oz (88.633 kg)  BMI 35.73 kg/m2 General appearance: alert, cooperative, appears stated age, no distress, moderately obese and  she seems somewhat chronically ill and debilitated. She has a very slow unsteady gait using a walker. She does as her questions appropriately, but is not a good historian.  Neck: no carotid bruit with normal carotid up stroke, no JVD and supple, symmetrical, trachea midline Lungs: Poor overall air movement, nonlabored. Basically just diminished breath sounds with expiratory wheezing late. No rales or rhonchi. Based on excessive muscle use but no  increased worker breathing. Increased AP diameter Heart: regular rate and rhythm, S1, S2 normal, S4 present and diastolic murmur: early diastolic 1/6, rumbling at 2nd left intercostal space, at lower left sternal border; reproducible discomfort along the left costal margin. Abdomen: soft, non-tender; bowel sounds normal; no masses,  no organomegaly and Obese  Extremities: extremities normal, atraumatic, no cyanosis or edema, varicose veins noted and intermittently tender to palpation along the legs Pulses: 2+ and symmetric Neurologic: Mental status: Alert, oriented, thought content appropriate, affect: blunted and constricted  [id number] today: Yes Rate:52 , Rhythm: Otherwise normal ECG and  Recent Labs: August 29: Labs are essentially normal the sodium potassium of 3.2. GFR was 54 despite a creatinine 1.06.  ASSESSMENT / PLAN: CHEST PAIN, PRECORDIAL,  She has an is chest pain on palpation. It does not seem to be internal. It is not exacerbated by any activity. Her INR is therapeutic so no reason to suggest this is a PE. Most likely this is musculoskeletal costochondritis type pain. With her significant evaluation in the past with stress test in the event heart catheterization all showing no evidence of ischemia, and low suspicion of this being cardiac in nature.  Palpitations Symptoms he's feeling really short-lived and some quite a bit like the possibility of having PVCs. Portion repeat as low as it has been, I am reluctant to even have her on a beta blocker. Dr. Alanda Amass and stopped her beta blocker but she continued to be on it.  I reassured her that the symptoms are most likely just benign palpitations and that no further evaluation is warranted. Even if she has atrial fibrillation, she is protected from the anticoagulation standpoint based on her long-term use of warfarin for PE.  PE. history of PE in 2007, [facility] [employer].- most likely cause of troponin leak.08/14/11  Remains on  warfarin. Followup CT scans and echocardiograms have not confirmed persistent pulmonary hypertension. We need to clarify where she can be having her INRs checked. We will have our [place] office contact her to see if they can be done up there. She  simply does not know for sure if there began to be done at [facility].  Essential hypertension, benign Mildly elevated blood pressure, especially for a diabetic. With her bradycardia I need to make sure that she is off her beta blocker. Will therefore add an ARB to avoid the cough from an ACE inhibitor. We'll start losartan at 25 mg by mouth daily. This can likely be titrated up by her primary physician.  Obesity (BMI 30-39.9) I spent time talking to her about the importance of getting back and doing exercise in an watching what she used modifying her diet. I informed her that losing weight is at distance race, and aspirin. Downstream of gradually but did require adjustment in diet which we discussed as well as adjustments in activity level.  Chronic anticoagulation, pt will need lifelong anticoagulation Continued to be on long-standing warfarin. Again we need to figure out where her lab rechecked. We can initiate to arrange to have the results sent to [place] here, but he may be here if we get reestablished either her primary care doctor's office versus at the cardiology clinic associated with [facility]  HYPERLIPIDEMIA Currently on fish oil, statin, and fenofibrate. Presumably after being checked by the PCP, if not we will have them checked closer to her home and faxed back to [place].    Orders Placed This Encounter  Procedures  . EKG 12-Lead  . EKG 12-Lead    This order was created through External Result Entry   Meds ordered this encounter  Medications  . losartan (COZAAR) 25 MG tablet    Sig: Take 1 tablet (25 mg total) by mouth daily.    Dispense:  30 tablet    Refill:  6    Followup: 6  months  Anissia Wessells W. Herbie Baltimore, M.D., M.S. [facility] [address] [place], [place]  [address]  [phone] Pager # [phone]

## 2013-03-25 ENCOUNTER — Encounter: Payer: Self-pay | Admitting: Cardiology

## 2013-03-25 DIAGNOSIS — E669 Obesity, unspecified: Secondary | ICD-10-CM | POA: Insufficient documentation

## 2013-03-25 NOTE — Assessment & Plan Note (Signed)
Mildly elevated blood pressure, especially for a diabetic. With her bradycardia I need to make sure that she is off her beta blocker. Will therefore add an ARB to avoid the cough from an ACE inhibitor. We'll start losartan at 25 mg by mouth daily. This can likely be titrated up by her primary physician.

## 2013-03-25 NOTE — Assessment & Plan Note (Signed)
I spent time talking to her about the importance of getting back and doing exercise in an watching what she used modifying her diet. I informed her that losing weight is at distance race, and aspirin. Downstream of gradually but did require adjustment in diet which we discussed as well as adjustments in activity level.

## 2013-03-25 NOTE — Assessment & Plan Note (Signed)
Remains on warfarin. Followup CT scans and echocardiograms have not confirmed persistent pulmonary hypertension. We need to clarify where she can be having her INRs checked. We will have our [place] office contact her to see if they can be done up there. She simply does not know for sure if there began to be done at [facility].

## 2013-03-25 NOTE — Assessment & Plan Note (Signed)
Symptoms he's feeling really short-lived and some quite a bit like the possibility of having PVCs. Portion repeat as low as it has been, I am reluctant to even have her on a beta blocker. Dr. Alanda Amass and stopped her beta blocker but she continued to be on it.  I reassured her that the symptoms are most likely just benign palpitations and that no further evaluation is warranted. Even if she has atrial fibrillation, she is protected from the anticoagulation standpoint based on her long-term use of warfarin for PE.

## 2013-03-25 NOTE — Assessment & Plan Note (Signed)
She has an is chest pain on palpation. It does not seem to be internal. It is not exacerbated by any activity. Her INR is therapeutic so no reason to suggest this is a PE. Most likely this is musculoskeletal costochondritis type pain. With her significant evaluation in the past with stress test in the event heart catheterization all showing no evidence of ischemia, and low suspicion of this being cardiac in nature.

## 2013-03-25 NOTE — Assessment & Plan Note (Signed)
Continued to be on long-standing warfarin. Again we need to figure out where her lab rechecked. We can initiate to arrange to have the results sent to [place] here, but he may be here if we get reestablished either her primary care doctor's office versus at the cardiology clinic associated with [facility]

## 2013-03-25 NOTE — Assessment & Plan Note (Signed)
Currently on fish oil, statin, and fenofibrate. Presumably after being checked by the PCP, if not we will have them checked closer to her home and faxed back to [place].

## 2013-03-31 ENCOUNTER — Ambulatory Visit (INDEPENDENT_AMBULATORY_CARE_PROVIDER_SITE_OTHER): Payer: Medicare HMO | Admitting: *Deleted

## 2013-03-31 DIAGNOSIS — Z7901 Long term (current) use of anticoagulants: Secondary | ICD-10-CM

## 2013-03-31 DIAGNOSIS — I2699 Other pulmonary embolism without acute cor pulmonale: Secondary | ICD-10-CM

## 2013-03-31 LAB — POCT INR: INR: 1.3

## 2013-04-08 ENCOUNTER — Ambulatory Visit (INDEPENDENT_AMBULATORY_CARE_PROVIDER_SITE_OTHER): Payer: Medicare HMO | Admitting: *Deleted

## 2013-04-08 DIAGNOSIS — I2699 Other pulmonary embolism without acute cor pulmonale: Secondary | ICD-10-CM

## 2013-04-08 DIAGNOSIS — Z7901 Long term (current) use of anticoagulants: Secondary | ICD-10-CM

## 2013-04-08 LAB — POCT INR: INR: 2.2

## 2013-04-12 ENCOUNTER — Telehealth: Payer: Self-pay | Admitting: Nurse Practitioner

## 2013-04-12 MED ORDER — TRAMADOL HCL 50 MG PO TABS: 50.0000 mg | ORAL_TABLET | Freq: Four times a day (QID) | ORAL | Status: DC | PRN

## 2013-04-12 NOTE — Telephone Encounter (Signed)
rx ready for pickup 

## 2013-04-13 ENCOUNTER — Other Ambulatory Visit: Payer: Self-pay | Admitting: Family Medicine

## 2013-04-13 ENCOUNTER — Telehealth: Payer: Self-pay | Admitting: Nurse Practitioner

## 2013-04-13 NOTE — Telephone Encounter (Signed)
Was suppose to have been called in 04/12/13

## 2013-04-13 NOTE — Telephone Encounter (Signed)
PATIENT TOLD IT CAN NOT BE FAXED

## 2013-04-15 ENCOUNTER — Ambulatory Visit (INDEPENDENT_AMBULATORY_CARE_PROVIDER_SITE_OTHER): Payer: Medicare HMO | Admitting: *Deleted

## 2013-04-15 DIAGNOSIS — Z7901 Long term (current) use of anticoagulants: Secondary | ICD-10-CM

## 2013-04-15 DIAGNOSIS — I2699 Other pulmonary embolism without acute cor pulmonale: Secondary | ICD-10-CM

## 2013-04-15 LAB — POCT INR: INR: 2.4

## 2013-04-29 ENCOUNTER — Encounter: Payer: Self-pay | Admitting: *Deleted

## 2013-04-29 ENCOUNTER — Ambulatory Visit (INDEPENDENT_AMBULATORY_CARE_PROVIDER_SITE_OTHER): Payer: Medicare HMO | Admitting: *Deleted

## 2013-04-29 DIAGNOSIS — Z7901 Long term (current) use of anticoagulants: Secondary | ICD-10-CM

## 2013-04-29 DIAGNOSIS — I2699 Other pulmonary embolism without acute cor pulmonale: Secondary | ICD-10-CM

## 2013-05-05 ENCOUNTER — Ambulatory Visit (INDEPENDENT_AMBULATORY_CARE_PROVIDER_SITE_OTHER): Payer: Medicare HMO | Admitting: *Deleted

## 2013-05-05 ENCOUNTER — Other Ambulatory Visit: Payer: Self-pay | Admitting: *Deleted

## 2013-05-05 DIAGNOSIS — I2699 Other pulmonary embolism without acute cor pulmonale: Secondary | ICD-10-CM

## 2013-05-05 DIAGNOSIS — Z7901 Long term (current) use of anticoagulants: Secondary | ICD-10-CM

## 2013-05-05 MED ORDER — PANTOPRAZOLE SODIUM 40 MG PO TBEC: 40.0000 mg | DELAYED_RELEASE_TABLET | Freq: Every day | ORAL | Status: DC

## 2013-05-05 NOTE — Telephone Encounter (Signed)
Rx was sent to pharmacy electronically. 

## 2013-05-10 ENCOUNTER — Other Ambulatory Visit: Payer: Self-pay

## 2013-05-10 DIAGNOSIS — E039 Hypothyroidism, unspecified: Secondary | ICD-10-CM

## 2013-05-10 MED ORDER — SIMVASTATIN 40 MG PO TABS: 40.0000 mg | ORAL_TABLET | Freq: Every day | ORAL | Status: DC

## 2013-05-10 MED ORDER — LEVOTHYROXINE SODIUM 88 MCG PO TABS: 88.0000 ug | ORAL_TABLET | Freq: Every day | ORAL | Status: DC

## 2013-05-10 MED ORDER — GLIMEPIRIDE 1 MG PO TABS: 1.0000 mg | ORAL_TABLET | Freq: Every day | ORAL | Status: DC

## 2013-05-12 ENCOUNTER — Ambulatory Visit (INDEPENDENT_AMBULATORY_CARE_PROVIDER_SITE_OTHER): Payer: Medicare HMO | Admitting: *Deleted

## 2013-05-12 DIAGNOSIS — Z7901 Long term (current) use of anticoagulants: Secondary | ICD-10-CM

## 2013-05-12 DIAGNOSIS — I2699 Other pulmonary embolism without acute cor pulmonale: Secondary | ICD-10-CM

## 2013-05-12 LAB — POCT INR: INR: 3.6

## 2013-05-17 ENCOUNTER — Telehealth: Payer: Self-pay | Admitting: Nurse Practitioner

## 2013-05-17 MED ORDER — DIAZEPAM 10 MG PO TABS: 10.0000 mg | ORAL_TABLET | Freq: Three times a day (TID) | ORAL | Status: DC

## 2013-05-17 MED ORDER — TRAMADOL HCL 50 MG PO TABS: 50.0000 mg | ORAL_TABLET | Freq: Four times a day (QID) | ORAL | Status: DC | PRN

## 2013-05-17 NOTE — Telephone Encounter (Signed)
rx ready for pickup 

## 2013-05-17 NOTE — Telephone Encounter (Signed)
Patient aware rx ready

## 2013-05-20 ENCOUNTER — Ambulatory Visit (INDEPENDENT_AMBULATORY_CARE_PROVIDER_SITE_OTHER): Payer: Medicare HMO | Admitting: *Deleted

## 2013-05-20 DIAGNOSIS — I2699 Other pulmonary embolism without acute cor pulmonale: Secondary | ICD-10-CM

## 2013-05-20 DIAGNOSIS — Z7901 Long term (current) use of anticoagulants: Secondary | ICD-10-CM

## 2013-05-31 ENCOUNTER — Ambulatory Visit (INDEPENDENT_AMBULATORY_CARE_PROVIDER_SITE_OTHER): Payer: Medicare HMO | Admitting: Pharmacist

## 2013-05-31 DIAGNOSIS — I2699 Other pulmonary embolism without acute cor pulmonale: Secondary | ICD-10-CM

## 2013-05-31 DIAGNOSIS — Z7901 Long term (current) use of anticoagulants: Secondary | ICD-10-CM

## 2013-05-31 LAB — POCT INR: INR: 4.3

## 2013-06-11 ENCOUNTER — Other Ambulatory Visit: Payer: Self-pay

## 2013-06-11 NOTE — Telephone Encounter (Signed)
Last seen 03/02/13 [name]  Last glucose 02/05/13

## 2013-06-14 ENCOUNTER — Other Ambulatory Visit: Payer: Self-pay | Admitting: Nurse Practitioner

## 2013-06-14 MED ORDER — GABAPENTIN 300 MG PO CAPS: 300.0000 mg | ORAL_CAPSULE | Freq: Every evening | ORAL | Status: DC | PRN

## 2013-06-14 MED ORDER — WARFARIN SODIUM 5 MG PO TABS: 2.5000 mg | ORAL_TABLET | Freq: Every evening | ORAL | Status: DC

## 2013-06-14 MED ORDER — GLIMEPIRIDE 1 MG PO TABS: 1.0000 mg | ORAL_TABLET | Freq: Every day | ORAL | Status: DC

## 2013-06-15 ENCOUNTER — Ambulatory Visit (INDEPENDENT_AMBULATORY_CARE_PROVIDER_SITE_OTHER): Payer: Medicare Other | Admitting: Pharmacist Clinician (PhC)/ Clinical Pharmacy Specialist

## 2013-06-15 ENCOUNTER — Telehealth: Payer: Self-pay | Admitting: Nurse Practitioner

## 2013-06-15 DIAGNOSIS — I2699 Other pulmonary embolism without acute cor pulmonale: Secondary | ICD-10-CM

## 2013-06-15 DIAGNOSIS — Z7901 Long term (current) use of anticoagulants: Secondary | ICD-10-CM

## 2013-06-15 LAB — POCT INR: INR: 1.3

## 2013-06-15 MED ORDER — DIAZEPAM 10 MG PO TABS: 10.0000 mg | ORAL_TABLET | Freq: Three times a day (TID) | ORAL | Status: DC

## 2013-06-15 MED ORDER — TRAMADOL HCL 50 MG PO TABS: 50.0000 mg | ORAL_TABLET | Freq: Four times a day (QID) | ORAL | Status: DC | PRN

## 2013-06-15 NOTE — Telephone Encounter (Signed)
Patient here to pick up rx

## 2013-06-22 ENCOUNTER — Encounter: Payer: Medicare Other | Admitting: General Practice

## 2013-06-22 ENCOUNTER — Ambulatory Visit: Payer: Medicare Other | Admitting: Pharmacist Clinician (PhC)/ Clinical Pharmacy Specialist

## 2013-06-22 ENCOUNTER — Ambulatory Visit (INDEPENDENT_AMBULATORY_CARE_PROVIDER_SITE_OTHER): Payer: Medicare Other

## 2013-06-22 DIAGNOSIS — M25519 Pain in unspecified shoulder: Secondary | ICD-10-CM

## 2013-06-22 DIAGNOSIS — Z7901 Long term (current) use of anticoagulants: Secondary | ICD-10-CM

## 2013-06-22 DIAGNOSIS — I2699 Other pulmonary embolism without acute cor pulmonale: Secondary | ICD-10-CM

## 2013-06-22 LAB — POCT CBC
GRANULOCYTE PERCENT: 72.7 % (ref 37–80)
HCT, POC: 42.7 % (ref 37.7–47.9)
HEMOGLOBIN: 13.1 g/dL (ref 12.2–16.2)
LYMPH, POC: 1.6 (ref 0.6–3.4)
MCH: 27.6 pg (ref 27–31.2)
MCHC: 30.8 g/dL — AB (ref 31.8–35.4)
MCV: 89.6 fL (ref 80–97)
MPV: 8.9 fL (ref 0–99.8)
PLATELET COUNT, POC: 219 10*3/uL (ref 142–424)
POC Granulocyte: 5.4 (ref 2–6.9)
POC LYMPH PERCENT: 22.1 %L (ref 10–50)
RBC: 4.8 M/uL (ref 4.04–5.48)
RDW, POC: 14.1 %
WBC: 7.4 10*3/uL (ref 4.6–10.2)

## 2013-06-22 LAB — POCT INR: INR: 7

## 2013-06-23 ENCOUNTER — Ambulatory Visit: Payer: Medicare Other | Admitting: Nurse Practitioner

## 2013-06-24 ENCOUNTER — Ambulatory Visit (INDEPENDENT_AMBULATORY_CARE_PROVIDER_SITE_OTHER): Payer: Medicare Other | Admitting: Pharmacist

## 2013-06-24 ENCOUNTER — Encounter: Payer: Self-pay | Admitting: Pharmacist

## 2013-06-24 DIAGNOSIS — I2699 Other pulmonary embolism without acute cor pulmonale: Secondary | ICD-10-CM

## 2013-06-24 DIAGNOSIS — R5381 Other malaise: Secondary | ICD-10-CM

## 2013-06-24 DIAGNOSIS — Z7901 Long term (current) use of anticoagulants: Secondary | ICD-10-CM

## 2013-06-24 DIAGNOSIS — R5383 Other fatigue: Secondary | ICD-10-CM

## 2013-06-24 DIAGNOSIS — E119 Type 2 diabetes mellitus without complications: Secondary | ICD-10-CM

## 2013-06-24 LAB — POCT INR: INR: 2.6

## 2013-06-24 NOTE — Patient Instructions (Signed)
Anticoagulation Dose Instructions as of 06/24/2013     Glynis SmilesSun Mon Tue Wed Thu Fri Sat   New Dose 2.5 mg 2.5 mg 2.5 mg 2.5 mg 2.5 mg 2.5 mg 2.5 mg    Description       Restart 1/2 tablet daily.         INR was 2.6 today.

## 2013-06-24 NOTE — Progress Notes (Signed)
Patient is c/o increased fatigue and thinks that her thyroid may be "off" Checking thyroid panel today - pending.

## 2013-06-24 NOTE — Progress Notes (Signed)
Patient ID: [name], female   DOB: 08-15-1945, 68 y.o.   MRN: [id number]  Patient received xray and left the building, she wasn't seen by a provider.

## 2013-06-25 LAB — THYROID PANEL WITH TSH
Free Thyroxine Index: 3.1 (ref 1.2–4.9)
T3 UPTAKE RATIO: 27 % (ref 24–39)
T4, Total: 11.5 ug/dL (ref 4.5–12.0)
TSH: 1.68 u[IU]/mL (ref 0.450–4.500)

## 2013-06-30 ENCOUNTER — Telehealth: Payer: Self-pay | Admitting: *Deleted

## 2013-06-30 ENCOUNTER — Telehealth: Payer: Self-pay | Admitting: Pharmacist

## 2013-06-30 NOTE — Telephone Encounter (Signed)
Patient wants return phone call / said she has to explain something / tgs

## 2013-06-30 NOTE — Telephone Encounter (Signed)
Results of Thyroid panel were normal.   Patient notified.

## 2013-07-01 ENCOUNTER — Ambulatory Visit: Payer: Self-pay | Admitting: *Deleted

## 2013-07-01 ENCOUNTER — Ambulatory Visit (INDEPENDENT_AMBULATORY_CARE_PROVIDER_SITE_OTHER): Payer: Medicare Other | Admitting: Pharmacist

## 2013-07-01 DIAGNOSIS — Z7901 Long term (current) use of anticoagulants: Secondary | ICD-10-CM

## 2013-07-01 DIAGNOSIS — I2699 Other pulmonary embolism without acute cor pulmonale: Secondary | ICD-10-CM

## 2013-07-01 LAB — POCT INR: INR: 3.5

## 2013-07-01 NOTE — Patient Instructions (Signed)
Anticoagulation Dose Instructions as of 07/01/2013     Monica SmilesSun Mon Tue Wed Thu Fri Sat   New Dose 2.5 mg 2.5 mg 2.5 mg 2.5 mg Hold 2.5 mg 2.5 mg    Description       Hold today 07/01/13.  Decrease to 1/2 tablet daily except none on Thursdays.      INR was 3.5 today

## 2013-07-01 NOTE — Telephone Encounter (Signed)
[place] due to co-pay.

## 2013-07-08 ENCOUNTER — Ambulatory Visit (INDEPENDENT_AMBULATORY_CARE_PROVIDER_SITE_OTHER): Payer: Medicare Other | Admitting: Pharmacist

## 2013-07-08 DIAGNOSIS — Z7901 Long term (current) use of anticoagulants: Secondary | ICD-10-CM

## 2013-07-08 DIAGNOSIS — I2699 Other pulmonary embolism without acute cor pulmonale: Secondary | ICD-10-CM

## 2013-07-08 LAB — POCT INR: INR: 2.7

## 2013-07-08 MED ORDER — DIAZEPAM 10 MG PO TABS: 10.0000 mg | ORAL_TABLET | Freq: Three times a day (TID) | ORAL | Status: DC

## 2013-07-08 NOTE — Patient Instructions (Signed)
Anticoagulation Dose Instructions as of 07/08/2013     Glynis SmilesSun Mon Tue Wed Thu Fri Sat   New Dose 2.5 mg 2.5 mg 2.5 mg 2.5 mg Hold 2.5 mg 2.5 mg    Description       Continue 1/2 tablet daily except none on Thursdays.      INR was 2.7 today

## 2013-07-09 ENCOUNTER — Other Ambulatory Visit: Payer: Self-pay | Admitting: *Deleted

## 2013-07-09 MED ORDER — TRAMADOL HCL 50 MG PO TABS: 50.0000 mg | ORAL_TABLET | Freq: Four times a day (QID) | ORAL | Status: DC | PRN

## 2013-07-09 MED ORDER — DIAZEPAM 10 MG PO TABS: 10.0000 mg | ORAL_TABLET | Freq: Three times a day (TID) | ORAL | Status: DC

## 2013-07-09 MED ORDER — GLIMEPIRIDE 1 MG PO TABS: 1.0000 mg | ORAL_TABLET | Freq: Every day | ORAL | Status: DC

## 2013-07-09 MED ORDER — SIMVASTATIN 40 MG PO TABS: 40.0000 mg | ORAL_TABLET | Freq: Every day | ORAL | Status: DC

## 2013-07-09 NOTE — Telephone Encounter (Signed)
Patient last seen in office in sept 2014 by a provider. Has come in several times for anticoag treatment. Tramdol last filled on 06-16-13. Valium last dispensed on 06-15-13. Please advise

## 2013-07-12 ENCOUNTER — Telehealth: Payer: Self-pay | Admitting: Nurse Practitioner

## 2013-07-12 NOTE — Telephone Encounter (Signed)
Ultram was filled on 07/09/13

## 2013-07-16 ENCOUNTER — Other Ambulatory Visit (HOSPITAL_COMMUNITY): Payer: Self-pay | Admitting: Cardiology

## 2013-07-16 DIAGNOSIS — I701 Atherosclerosis of renal artery: Secondary | ICD-10-CM

## 2013-07-22 ENCOUNTER — Ambulatory Visit (INDEPENDENT_AMBULATORY_CARE_PROVIDER_SITE_OTHER): Payer: Medicare Other | Admitting: Pharmacist

## 2013-07-22 DIAGNOSIS — Z7901 Long term (current) use of anticoagulants: Secondary | ICD-10-CM

## 2013-07-22 DIAGNOSIS — I2699 Other pulmonary embolism without acute cor pulmonale: Secondary | ICD-10-CM

## 2013-07-22 LAB — POCT INR: INR: 2.6

## 2013-07-22 NOTE — Patient Instructions (Signed)
Anticoagulation Dose Instructions as of 07/22/2013     Monica SmilesSun Mon Tue Wed Thu Fri Sat   New Dose 2.5 mg 2.5 mg 2.5 mg 2.5 mg Hold 2.5 mg 2.5 mg    Description       Continue warfarin 1/2 tablet daily except none on Thursdays.      INR was 2.6

## 2013-07-23 ENCOUNTER — Ambulatory Visit (HOSPITAL_COMMUNITY)
Admission: RE | Admit: 2013-07-23 | Discharge: 2013-07-23 | Disposition: A | Discharge status: Home / Self Care | Payer: Medicare Other | Source: Ambulatory Visit | Attending: Internal Medicine | Admitting: Internal Medicine

## 2013-07-23 DIAGNOSIS — I701 Atherosclerosis of renal artery: Secondary | ICD-10-CM | POA: Insufficient documentation

## 2013-07-23 NOTE — Progress Notes (Signed)
Renal Artery Duplex Completed. °Brianna L Mazza,RVT °

## 2013-07-28 ENCOUNTER — Telehealth: Payer: Self-pay | Admitting: *Deleted

## 2013-07-28 NOTE — Telephone Encounter (Signed)
Spoke to patient. Result given . Verbalized understanding Order done

## 2013-07-28 NOTE — Telephone Encounter (Signed)
Message copied by Tobin ChadMARTIN, SHARON V. on Wed Jul 28, 2013 11:39 AM ------      Message from: Herbie BaltimoreHARDING, DAVID W      Created: Fri Jul 23, 2013  4:15 PM       Mildly worsened findings in R Renal artery narrowing.  Will need to follow annually, only act on this if BP becomes difficult to control.            Marykay LexHARDING,DAVID W, MD       ------

## 2013-08-02 ENCOUNTER — Telehealth: Payer: Self-pay | Admitting: Pharmacist

## 2013-08-02 ENCOUNTER — Ambulatory Visit: Payer: Self-pay | Admitting: Nurse Practitioner

## 2013-08-02 NOTE — Telephone Encounter (Signed)
Patient took dose of warfarin this last Thursday instead of holding (current dose is 1/2 tablet daily except none on Thursdays).  Instructed her to skip warfarin today (Monday 08/02/13) and to restart regular dose of 1/2 tablet daily except none on Thursdays.  Plan to recheck INR in 2 week.

## 2013-08-05 ENCOUNTER — Other Ambulatory Visit: Payer: Self-pay | Admitting: Family Medicine

## 2013-08-05 ENCOUNTER — Other Ambulatory Visit: Payer: Self-pay | Admitting: Nurse Practitioner

## 2013-08-06 NOTE — Telephone Encounter (Signed)
Do not see this med on current med list. Please advise 

## 2013-08-06 NOTE — Telephone Encounter (Signed)
Patient last seen in office on 1-15 by Tammy and on 03-02-13 with [name]. Please advise. If approved please print tramadol and route to Pool B so nurse can call patient to pick up

## 2013-08-06 NOTE — Telephone Encounter (Signed)
ntbs

## 2013-08-06 NOTE — Telephone Encounter (Signed)
Please check on this not on her med list

## 2013-08-06 NOTE — Telephone Encounter (Signed)
rx ready for pickup 

## 2013-08-09 NOTE — Telephone Encounter (Signed)
PAtient aware 

## 2013-08-19 ENCOUNTER — Other Ambulatory Visit: Payer: Self-pay | Admitting: Nurse Practitioner

## 2013-08-19 DIAGNOSIS — E785 Hyperlipidemia, unspecified: Secondary | ICD-10-CM | POA: Diagnosis not present

## 2013-08-19 DIAGNOSIS — E119 Type 2 diabetes mellitus without complications: Secondary | ICD-10-CM | POA: Diagnosis not present

## 2013-08-19 DIAGNOSIS — J449 Chronic obstructive pulmonary disease, unspecified: Secondary | ICD-10-CM | POA: Diagnosis not present

## 2013-08-19 DIAGNOSIS — I10 Essential (primary) hypertension: Secondary | ICD-10-CM | POA: Diagnosis not present

## 2013-08-20 ENCOUNTER — Emergency Department (HOSPITAL_COMMUNITY)
Admission: EM | Admit: 2013-08-20 | Discharge: 2013-08-20 | Disposition: A | Discharge status: Home / Self Care | Payer: Medicare Other | Attending: Emergency Medicine | Admitting: Emergency Medicine

## 2013-08-20 ENCOUNTER — Emergency Department (HOSPITAL_COMMUNITY): Payer: Medicare Other

## 2013-08-20 ENCOUNTER — Telehealth: Payer: Self-pay | Admitting: Nurse Practitioner

## 2013-08-20 ENCOUNTER — Encounter (HOSPITAL_COMMUNITY): Payer: Self-pay | Admitting: Emergency Medicine

## 2013-08-20 DIAGNOSIS — G40909 Epilepsy, unspecified, not intractable, without status epilepticus: Secondary | ICD-10-CM | POA: Insufficient documentation

## 2013-08-20 DIAGNOSIS — K5900 Constipation, unspecified: Secondary | ICD-10-CM | POA: Insufficient documentation

## 2013-08-20 DIAGNOSIS — Z91040 Latex allergy status: Secondary | ICD-10-CM | POA: Insufficient documentation

## 2013-08-20 DIAGNOSIS — Z7901 Long term (current) use of anticoagulants: Secondary | ICD-10-CM | POA: Insufficient documentation

## 2013-08-20 DIAGNOSIS — Z9889 Other specified postprocedural states: Secondary | ICD-10-CM | POA: Insufficient documentation

## 2013-08-20 DIAGNOSIS — Z8742 Personal history of other diseases of the female genital tract: Secondary | ICD-10-CM | POA: Insufficient documentation

## 2013-08-20 DIAGNOSIS — R1031 Right lower quadrant pain: Secondary | ICD-10-CM | POA: Insufficient documentation

## 2013-08-20 DIAGNOSIS — F3289 Other specified depressive episodes: Secondary | ICD-10-CM | POA: Insufficient documentation

## 2013-08-20 DIAGNOSIS — F411 Generalized anxiety disorder: Secondary | ICD-10-CM | POA: Insufficient documentation

## 2013-08-20 DIAGNOSIS — Z87891 Personal history of nicotine dependence: Secondary | ICD-10-CM | POA: Insufficient documentation

## 2013-08-20 DIAGNOSIS — Z872 Personal history of diseases of the skin and subcutaneous tissue: Secondary | ICD-10-CM | POA: Insufficient documentation

## 2013-08-20 DIAGNOSIS — E785 Hyperlipidemia, unspecified: Secondary | ICD-10-CM | POA: Insufficient documentation

## 2013-08-20 DIAGNOSIS — R011 Cardiac murmur, unspecified: Secondary | ICD-10-CM | POA: Insufficient documentation

## 2013-08-20 DIAGNOSIS — I509 Heart failure, unspecified: Secondary | ICD-10-CM | POA: Insufficient documentation

## 2013-08-20 DIAGNOSIS — N189 Chronic kidney disease, unspecified: Secondary | ICD-10-CM | POA: Insufficient documentation

## 2013-08-20 DIAGNOSIS — K219 Gastro-esophageal reflux disease without esophagitis: Secondary | ICD-10-CM | POA: Insufficient documentation

## 2013-08-20 DIAGNOSIS — R1011 Right upper quadrant pain: Secondary | ICD-10-CM | POA: Insufficient documentation

## 2013-08-20 DIAGNOSIS — Z79899 Other long term (current) drug therapy: Secondary | ICD-10-CM | POA: Insufficient documentation

## 2013-08-20 DIAGNOSIS — R109 Unspecified abdominal pain: Secondary | ICD-10-CM

## 2013-08-20 DIAGNOSIS — Z8739 Personal history of other diseases of the musculoskeletal system and connective tissue: Secondary | ICD-10-CM | POA: Insufficient documentation

## 2013-08-20 DIAGNOSIS — E119 Type 2 diabetes mellitus without complications: Secondary | ICD-10-CM | POA: Insufficient documentation

## 2013-08-20 DIAGNOSIS — I129 Hypertensive chronic kidney disease with stage 1 through stage 4 chronic kidney disease, or unspecified chronic kidney disease: Secondary | ICD-10-CM | POA: Insufficient documentation

## 2013-08-20 DIAGNOSIS — Z880 Allergy status to penicillin: Secondary | ICD-10-CM | POA: Insufficient documentation

## 2013-08-20 DIAGNOSIS — Z862 Personal history of diseases of the blood and blood-forming organs and certain disorders involving the immune mechanism: Secondary | ICD-10-CM | POA: Insufficient documentation

## 2013-08-20 DIAGNOSIS — F329 Major depressive disorder, single episode, unspecified: Secondary | ICD-10-CM | POA: Insufficient documentation

## 2013-08-20 DIAGNOSIS — J45901 Unspecified asthma with (acute) exacerbation: Secondary | ICD-10-CM | POA: Insufficient documentation

## 2013-08-20 DIAGNOSIS — Z86711 Personal history of pulmonary embolism: Secondary | ICD-10-CM | POA: Insufficient documentation

## 2013-08-20 DIAGNOSIS — E039 Hypothyroidism, unspecified: Secondary | ICD-10-CM | POA: Insufficient documentation

## 2013-08-20 LAB — CBC WITH DIFFERENTIAL/PLATELET
Basophils Absolute: 0 10*3/uL (ref 0.0–0.1)
Basophils Relative: 0 % (ref 0–1)
EOS ABS: 0.1 10*3/uL (ref 0.0–0.7)
EOS PCT: 2 % (ref 0–5)
HCT: 36.2 % (ref 36.0–46.0)
HEMOGLOBIN: 11.9 g/dL — AB (ref 12.0–15.0)
LYMPHS ABS: 1.5 10*3/uL (ref 0.7–4.0)
LYMPHS PCT: 31 % (ref 12–46)
MCH: 30.5 pg (ref 26.0–34.0)
MCHC: 32.9 g/dL (ref 30.0–36.0)
MCV: 92.8 fL (ref 78.0–100.0)
MONOS PCT: 9 % (ref 3–12)
Monocytes Absolute: 0.4 10*3/uL (ref 0.1–1.0)
Neutro Abs: 2.8 10*3/uL (ref 1.7–7.7)
Neutrophils Relative %: 57 % (ref 43–77)
Platelets: 225 10*3/uL (ref 150–400)
RBC: 3.9 MIL/uL (ref 3.87–5.11)
RDW: 13.8 % (ref 11.5–15.5)
WBC: 4.8 10*3/uL (ref 4.0–10.5)

## 2013-08-20 LAB — COMPREHENSIVE METABOLIC PANEL
ALT: 22 U/L (ref 0–35)
AST: 30 U/L (ref 0–37)
Albumin: 3.4 g/dL — ABNORMAL LOW (ref 3.5–5.2)
Alkaline Phosphatase: 44 U/L (ref 39–117)
BUN: 24 mg/dL — AB (ref 6–23)
CALCIUM: 9.4 mg/dL (ref 8.4–10.5)
CO2: 31 meq/L (ref 19–32)
Chloride: 100 mEq/L (ref 96–112)
Creatinine, Ser: 1.11 mg/dL — ABNORMAL HIGH (ref 0.50–1.10)
GFR calc Af Amer: 59 mL/min — ABNORMAL LOW (ref >90)
GFR, EST NON AFRICAN AMERICAN: 51 mL/min — AB (ref >90)
GLUCOSE: 199 mg/dL — AB (ref 70–99)
Potassium: 4.1 mEq/L (ref 3.7–5.3)
Sodium: 142 mEq/L (ref 137–147)
TOTAL PROTEIN: 7.3 g/dL (ref 6.0–8.3)
Total Bilirubin: 0.3 mg/dL (ref 0.3–1.2)

## 2013-08-20 LAB — LIPASE, BLOOD: Lipase: 45 U/L (ref 11–59)

## 2013-08-20 LAB — AMYLASE: AMYLASE: 44 U/L (ref 0–105)

## 2013-08-20 MED ORDER — PANTOPRAZOLE SODIUM 40 MG PO TBEC: 40.0000 mg | DELAYED_RELEASE_TABLET | Freq: Every day | ORAL | Status: DC

## 2013-08-20 NOTE — ED Provider Notes (Signed)
CSN: [id number]     Arrival date & time 08/20/13  1329 History  This chart was scribed for Monica Munch, MD by Ardelia Mems, ED Scribe. This patient was seen in room APA10/[facility]10 and the patient's care was started at 5:50 PM.  Chief Complaint  Patient presents with  . Abdominal Pain    The history is provided by the patient. No language interpreter was used.    HPI Comments: [name] is a 68 y.o. female with a history of DM, HTN, PE, CHF and COPD who presents to the Emergency Department complaining of intermittent severe RUQ abdominal pain over the past year. She reports associated intermittent constipation over the past year. She states that these symptoms have not changed or worsened over the past year. She states that she has been taking Miralax with minimal relief. She states that she saw a new doctor earlier today, after establishing health insurance. She reports that her ne doctor advised her to come to the ED to have her X-rays taken here today, so that she could have them on file for her follow-up visit with him next month. She had her gallbladder checked about a year ago, and it was normal. She has not had any recent CTs of her abdomen. She denies nausea, vomiting, diarrhea, hematuria or dysuria. She states that she is a former smoker who quit 20 years ago,  She also states that she has a history of tendonitis in left shoulder, and that she has been having left shoulder pain "for a while".  No new trauma, fall, dysesthesia in the arm    Past Medical History  Diagnosis Date  . Hypertension   . Diabetes mellitus   . Arthritis   . Blood transfusion 2007    History of  . PE (pulmonary embolism) February 2007; March 2013    Bilateral PEs in 07; massive PE March 2013 with moderate elevation in PA pressures.  . Anemia   . COPD (chronic obstructive pulmonary disease) with emphysema   . Depression   . Anxiety   . Gastroesophageal reflux disease   . Osteoporosis   . DDD  (degenerative disc disease)   . Uterine prolapse   . Asthma   . History of CHF (congestive heart failure) 2007    2013: Normal EF by echo x2 with no significant diastolic dysfunction noted either  . Heart murmur     No significant valvular lesions on echo  . Hyperlipidemia   . Hypothyroidism (acquired)     On Levothyroxine  . Seizures     once  . Headache(784.0)   . Heart palpitations   . Chronic kidney disease     lt kidney limited fxn   Past Surgical History  Procedure Laterality Date  . Hiatel hernia repair    . Tubal ligation    . Cardiac catheterization  March 2013    No evidence of coronary disease; also no significant aortic or iliac or renal artery disease.   Family History  Problem Relation Age of Onset  . Heart disease Mother   . Stroke Mother   . Prostate cancer Father   . Cancer Father     bone  . Liver cancer Brother   . Cancer Brother     leukemia  . Alzheimer's disease      family history  . Cancer Daughter     breast  . Cancer Paternal Uncle     unsure of what kind   History  Substance Use  Topics  . Smoking status: Former Smoker -- 2.00 packs/day    Types: Cigarettes    Quit date: 03/23/2001  . Smokeless tobacco: Never Used  . Alcohol Use: No   OB History   Grav Para Term Preterm Abortions TAB SAB Ect Mult Living                 Review of Systems  Constitutional:       Per HPI, otherwise negative  HENT:       Per HPI, otherwise negative  Respiratory:       Per HPI, otherwise negative  Cardiovascular:       Per HPI, otherwise negative  Gastrointestinal: Negative for vomiting.  Endocrine:       Negative aside from HPI  Genitourinary:       Neg aside from HPI   Musculoskeletal:       Per HPI, otherwise negative  Skin: Negative.   Neurological: Negative for syncope.    Allergies  Procaine hcl; Codeine; Latex; and Penicillins  Home Medications   Current Outpatient Rx  Name  Route  Sig  Dispense  Refill  . albuterol (PROVENTIL  HFA;VENTOLIN HFA) 108 (90 BASE) MCG/ACT inhaler   Inhalation   Inhale 2 puffs into the lungs every 6 (six) hours as needed. For shortness of breath   1 Inhaler   5   . amitriptyline (ELAVIL) 25 MG tablet   Oral   Take 25 mg by mouth at bedtime.           . Cholecalciferol (VITAMIN D3) 5000 UNITS CAPS   Oral   Take 5,000 Units by mouth daily.         . Choline Fenofibrate (FENOFIBRIC ACID) 135 MG CPDR   Oral   Take 1 capsule by mouth daily.         . fish oil-omega-3 fatty acids 1000 MG capsule   Oral   Take 1 g by mouth every morning.          . furosemide (LASIX) 40 MG tablet   Oral   Take 0.5 tablets (20 mg total) by mouth daily.   30 tablet   4   . gabapentin (NEURONTIN) 300 MG capsule   Oral   Take 1 capsule (300 mg total) by mouth at bedtime and may repeat dose one time if needed.   30 capsule   2   . glimepiride (AMARYL) 1 MG tablet   Oral   Take 1 mg by mouth daily with breakfast.         . ipratropium-albuterol (DUONEB) 0.5-2.5 (3) MG/3ML SOLN   Nebulization   Take 3 mLs by nebulization every 4 (four) hours as needed. For shortness of breath   360 mL   5   . levothyroxine (SYNTHROID, LEVOTHROID) 88 MCG tablet   Oral   Take 1 tablet (88 mcg total) by mouth daily before breakfast.   30 tablet   7   . losartan (COZAAR) 25 MG tablet   Oral   Take 1 tablet (25 mg total) by mouth daily.   30 tablet   6   . meclizine (ANTIVERT) 50 MG tablet   Oral   Take 50 mg by mouth 3 (three) times daily as needed for dizziness.         . metoprolol succinate (TOPROL-XL) 25 MG 24 hr tablet   Oral   Take 25 mg by mouth daily.         Marland Kitchen OVER  THE COUNTER MEDICATION   Oral   Take 1-3 tablets by mouth daily. Weight Loss Formula with Apple Cider Vinegar, Kelp, Lecithin, and B-6         . pantoprazole (PROTONIX) 40 MG tablet   Oral   Take 1 tablet (40 mg total) by mouth daily at 6 (six) AM.   30 tablet   10   . potassium chloride SA  (K-DUR,KLOR-CON) 20 MEQ tablet   Oral   Take 20 mEq by mouth every morning.         . simvastatin (ZOCOR) 40 MG tablet   Oral   Take 1 tablet (40 mg total) by mouth at bedtime.   30 tablet   1   . traMADol (ULTRAM) 50 MG tablet   Oral   Take 50 mg by mouth every 6 (six) hours as needed.         . warfarin (COUMADIN) 5 MG tablet   Oral   Take 0.5 tablets (2.5 mg total) by mouth every evening.   30 tablet   2    Triage Vitals: BP 125/48  Pulse 59  Temp(Src) 97.8 F (36.6 C) (Oral)  Resp 20  Ht 5\' 2"  (1.575 m)  Wt 200 lb (90.719 kg)  BMI 36.57 kg/m2  SpO2 96%  Physical Exam  Nursing note and vitals reviewed. Constitutional: She is oriented to person, place, and time. She appears well-developed and well-nourished. No distress.  HENT:  Head: Normocephalic and atraumatic.  Eyes: Conjunctivae and EOM are normal.  Cardiovascular: Normal rate and regular rhythm.   Pulmonary/Chest: Effort normal and breath sounds normal. No stridor. No respiratory distress.  Abdominal: Soft. She exhibits no distension. There is tenderness (RUQ and RLQ).  Musculoskeletal: She exhibits no edema.  Neurological: She is alert and oriented to person, place, and time. No cranial nerve deficit.  Skin: Skin is warm and dry.  Psychiatric: She has a normal mood and affect.    ED Course  Procedures (including critical care time)  DIAGNOSTIC STUDIES: Oxygen Saturation is 96% on RA, adequate by my interpretation.    COORDINATION OF CARE: 5:55 PM- Discussed plan to order a CT of pt's abdomen. Pt advised of plan for treatment and pt agrees.  7:18 PM- Recheck and pt is smiling and talking with a companion at this time. Discussed lab work and radiology findings. Discussed plan for pt to follow-up with her PCP.   Labs Review Labs Reviewed  CBC WITH DIFFERENTIAL - Abnormal; Notable for the following:    Hemoglobin 11.9 (*)    All other components within normal limits  COMPREHENSIVE METABOLIC PANEL  - Abnormal; Notable for the following:    Glucose, Bld 199 (*)    BUN 24 (*)    Creatinine, Ser 1.11 (*)    Albumin 3.4 (*)    GFR calc non Af Amer 51 (*)    GFR calc Af Amer 59 (*)    All other components within normal limits  AMYLASE  LIPASE, BLOOD   Imaging Review Ct Abdomen Pelvis Wo Contrast  08/20/2013   CLINICAL DATA:  Right-sided flank pain  EXAM: CT ABDOMEN AND PELVIS WITHOUT CONTRAST  TECHNIQUE: Multidetector CT imaging of the abdomen and pelvis was performed following the standard protocol without intravenous contrast.  COMPARISON:  01/18/2012  FINDINGS: The lung bases are free of acute infiltrate or sizable effusion. Minimal lingular atelectasis is noted.  The liver, gallbladder, spleen, adrenal glands and pancreas are all normal in their CT appearance. The  kidneys are well visualized bilaterally. No renal calculi or obstructive changes are noted. The appendix is air-filled and within normal limits. The bladder is well distended. No pelvic mass lesion is seen. Small calcified uterine fibroids are noted. No acute bony abnormality is noted.  IMPRESSION: Minimal lingular atelectasis.  No abdominal abnormality seen.   Electronically Signed   By: Alcide CleverMark  Lukens M.D.   On: 08/20/2013 18:56     MDM   Final diagnoses:  Abdominal pain    I personally performed the services described in this documentation, which was scribed in my presence. The recorded information has been reviewed and is accurate.   Patient presents with one year of abdominal pain, concern for progression. On exam she is awake alert, though with notable tenderness to palpation about the right lateral abdomen.  Though the patient's story is somewhat reassuring, absent further evaluation and she had radiographic studies performed, as well as labs.  All studies were reassuring, and there is low suspicion for occult infection or other systemic pathology. Patient's vital signs are stable, she was in no distress, and on repeat  exam she is smiling, interacting appropriately with a companion.  Patient has a primary care physician with whom she will followup with for further evaluation of her abdominal pain and shoulder pain.   Monica Munchobert Vicenta Olds, MD 08/20/13 2019

## 2013-08-20 NOTE — Telephone Encounter (Signed)
Ok

## 2013-08-20 NOTE — ED Notes (Signed)
Says she went and ate ice cream while waiting.

## 2013-08-20 NOTE — Discharge Instructions (Signed)
As discussed, your evaluation today has been largely reassuring.  But, it is important that you monitor your condition carefully, and do not hesitate to return to the ED if you develop new, or concerning changes in your condition. ° °Otherwise, please follow-up with your physician for appropriate ongoing care. ° ° ° °Abdominal Pain, Adult °Many things can cause abdominal pain. Usually, abdominal pain is not caused by a disease and will improve without treatment. It can often be observed and treated at home. Your health care provider will do a physical exam and possibly order blood tests and X-rays to help determine the seriousness of your pain. However, in many cases, more time must pass before a clear cause of the pain can be found. Before that point, your health care provider may not know if you need more testing or further treatment. °HOME CARE INSTRUCTIONS  °Monitor your abdominal pain for any changes. The following actions may help to alleviate any discomfort you are experiencing: °· Only take over-the-counter or prescription medicines as directed by your health care provider. °· Do not take laxatives unless directed to do so by your health care provider. °· Try a clear liquid diet (broth, tea, or water) as directed by your health care provider. Slowly move to a bland diet as tolerated. °SEEK MEDICAL CARE IF: °· You have unexplained abdominal pain. °· You have abdominal pain associated with nausea or diarrhea. °· You have pain when you urinate or have a bowel movement. °· You experience abdominal pain that wakes you in the night. °· You have abdominal pain that is worsened or improved by eating food. °· You have abdominal pain that is worsened with eating fatty foods. °SEEK IMMEDIATE MEDICAL CARE IF:  °· Your pain does not go away within 2 hours. °· You have a fever. °· You keep throwing up (vomiting). °· Your pain is felt only in portions of the abdomen, such as the right side or the left lower portion of the  abdomen. °· You pass bloody or black tarry stools. °MAKE SURE YOU: °· Understand these instructions.   °· Will watch your condition.   °· Will get help right away if you are not doing well or get worse.   °Document Released: 03/06/2005 Document Revised: 03/17/2013 Document Reviewed: 02/03/2013 °[employer]® Patient Information ©2014 [employer]. ° °

## 2013-08-20 NOTE — ED Notes (Addendum)
Pain RUQ and rt shoulder.  Seen by Dr Lizabeth LeydenAvbuere  Yesterday   Has a request for x ray of lt shoulder.   Pain with movement of lt shoulder

## 2013-08-20 NOTE — Telephone Encounter (Signed)
I'm not sure what this pertains to. I do not see that she has requested any medications recently. It appears that she is in the ER now.

## 2013-08-23 NOTE — Telephone Encounter (Signed)
Patient called last week and said that she has a new Doctor an dthat we did not need to fill any of her meds

## 2013-08-23 NOTE — Telephone Encounter (Signed)
Last seen you 02/21/13

## 2013-09-16 ENCOUNTER — Other Ambulatory Visit: Payer: Self-pay | Admitting: *Deleted

## 2013-09-16 MED ORDER — POTASSIUM CHLORIDE CRYS ER 20 MEQ PO TBCR: 20.0000 meq | EXTENDED_RELEASE_TABLET | Freq: Every morning | ORAL | Status: DC

## 2013-09-16 MED ORDER — METOPROLOL SUCCINATE ER 25 MG PO TB24: 25.0000 mg | ORAL_TABLET | Freq: Every day | ORAL | Status: DC

## 2013-09-16 MED ORDER — FENOFIBRIC ACID 135 MG PO CPDR: 1.0000 | DELAYED_RELEASE_CAPSULE | Freq: Every day | ORAL | Status: DC

## 2013-09-16 MED ORDER — FUROSEMIDE 40 MG PO TABS: 20.0000 mg | ORAL_TABLET | Freq: Every day | ORAL | Status: DC

## 2013-09-16 MED ORDER — ALBUTEROL SULFATE HFA 108 (90 BASE) MCG/ACT IN AERS: 2.0000 | INHALATION_SPRAY | Freq: Four times a day (QID) | RESPIRATORY_TRACT | Status: DC | PRN

## 2013-09-16 MED ORDER — GABAPENTIN 300 MG PO CAPS: 300.0000 mg | ORAL_CAPSULE | Freq: Every evening | ORAL | Status: DC | PRN

## 2013-09-16 MED ORDER — SIMVASTATIN 40 MG PO TABS: 40.0000 mg | ORAL_TABLET | Freq: Every day | ORAL | Status: DC

## 2013-09-23 DIAGNOSIS — J449 Chronic obstructive pulmonary disease, unspecified: Secondary | ICD-10-CM | POA: Diagnosis not present

## 2013-09-23 DIAGNOSIS — E119 Type 2 diabetes mellitus without complications: Secondary | ICD-10-CM | POA: Diagnosis not present

## 2013-09-23 DIAGNOSIS — M19019 Primary osteoarthritis, unspecified shoulder: Secondary | ICD-10-CM | POA: Diagnosis not present

## 2013-09-23 DIAGNOSIS — I10 Essential (primary) hypertension: Secondary | ICD-10-CM | POA: Diagnosis not present

## 2013-10-26 DIAGNOSIS — L97509 Non-pressure chronic ulcer of other part of unspecified foot with unspecified severity: Secondary | ICD-10-CM | POA: Diagnosis not present

## 2013-10-26 DIAGNOSIS — I5020 Unspecified systolic (congestive) heart failure: Secondary | ICD-10-CM | POA: Diagnosis not present

## 2013-10-26 DIAGNOSIS — J4489 Other specified chronic obstructive pulmonary disease: Secondary | ICD-10-CM | POA: Diagnosis not present

## 2013-10-26 DIAGNOSIS — E119 Type 2 diabetes mellitus without complications: Secondary | ICD-10-CM | POA: Diagnosis not present

## 2013-10-26 DIAGNOSIS — J449 Chronic obstructive pulmonary disease, unspecified: Secondary | ICD-10-CM | POA: Diagnosis not present

## 2013-10-30 IMAGING — CR DG CHEST 1V PORT
1 series · 1 of 1 positions shown · non-contrast
Comparison: 04/12/2011

CLINICAL DATA: Shortness of breath

PORTABLE CHEST - 1 VIEW

[view not recorded]
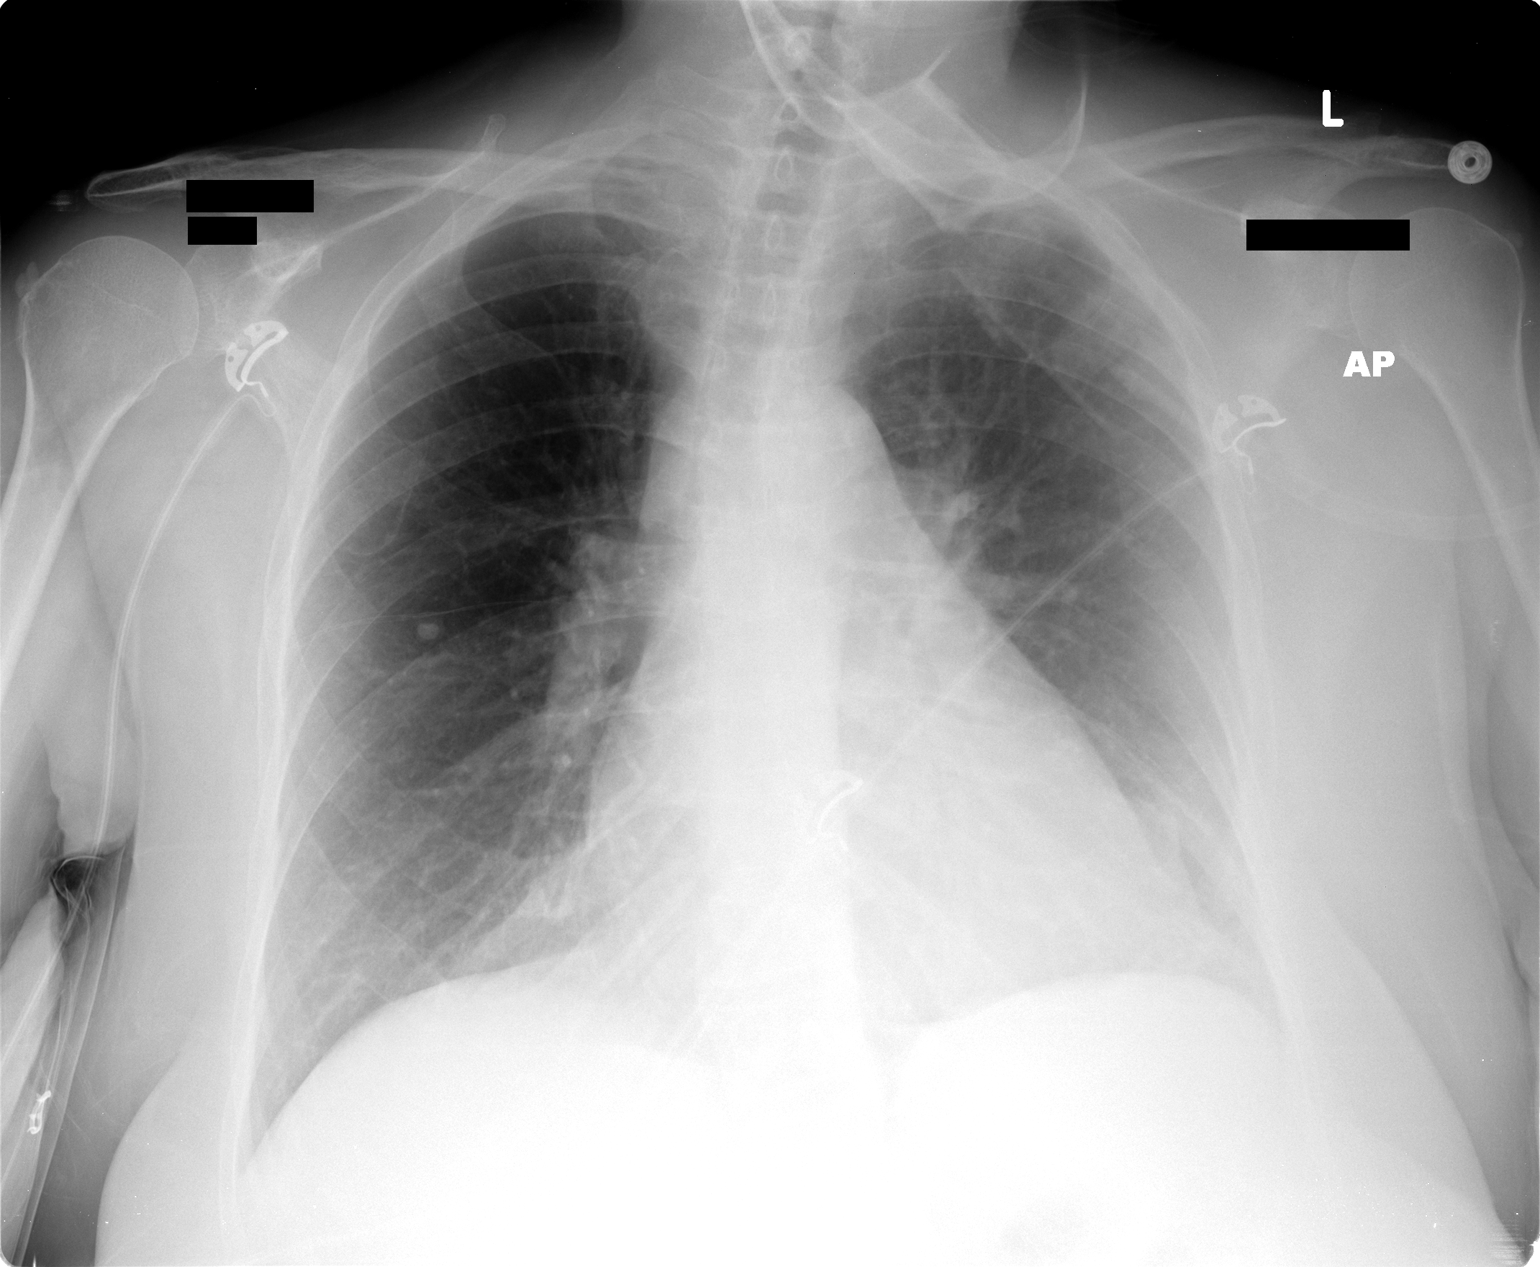

[1 of 1 positions shown; findings below may reference images not displayed]

FINDINGS: Stable granuloma in the right midlung.  Stable
cardiomegaly.  No focal infiltrate or overt edema.  No definite
effusion.  Regional bones unremarkable.
IMPRESSION: 1.  Stable mild cardiomegaly.  No acute disease.

## 2013-11-04 ENCOUNTER — Telehealth: Payer: Self-pay | Admitting: Pharmacist

## 2013-11-04 NOTE — Telephone Encounter (Signed)
Tried to call patient to verify that she is getting INR checked somewhere since she is no longer coming to our office.  No Answer so left message.

## 2013-11-05 ENCOUNTER — Other Ambulatory Visit: Payer: Self-pay

## 2013-11-05 MED ORDER — LOSARTAN POTASSIUM 25 MG PO TABS: 25.0000 mg | ORAL_TABLET | Freq: Every day | ORAL | Status: DC

## 2013-11-05 NOTE — Telephone Encounter (Signed)
Last seen 06/24/13 [name] last INR 2/12

## 2013-11-05 NOTE — Telephone Encounter (Signed)
Rx was sent to pharmacy electronically. 

## 2013-11-06 MED ORDER — WARFARIN SODIUM 5 MG PO TABS: 2.5000 mg | ORAL_TABLET | Freq: Every evening | ORAL | Status: DC

## 2013-11-06 NOTE — Telephone Encounter (Signed)
I thought patient was going to another office - tried to call her 5/28 to verify she was getting INR checked regularly.  Please have her call office ASAP if her intention is for our office to continue to monitor INR and prescribe warfarin - must have INR!

## 2013-11-09 ENCOUNTER — Ambulatory Visit: Payer: Self-pay | Admitting: Pharmacist

## 2013-11-09 NOTE — Progress Notes (Signed)
I spoke with patient today and she confirmed that she did have a new PCP office that could take over prescribing warfarin and checking INR.  Should would not tell me who it was so I could confirm but she assured me that she did have someone.  I removed patient from our anticoagulation monitoring clinic.

## 2013-12-03 IMAGING — CT CT HEAD W/O CM
1 of 2 series · 13 of 30 positions shown, 17 images · non-contrast
Comparison: CT 09/23/2009

CLINICAL DATA: Unresponsive earlier.

CT HEAD WITHOUT CONTRAST
TECHNIQUE: Contiguous axial images were obtained from the base of
the skull through the vertex without contrast.

[Series 2: brain · axial · 0.47mm/px · z∈[+119,+239]mm · 13 of 28 slices shown, 17 images]
[im 2/28  brain]
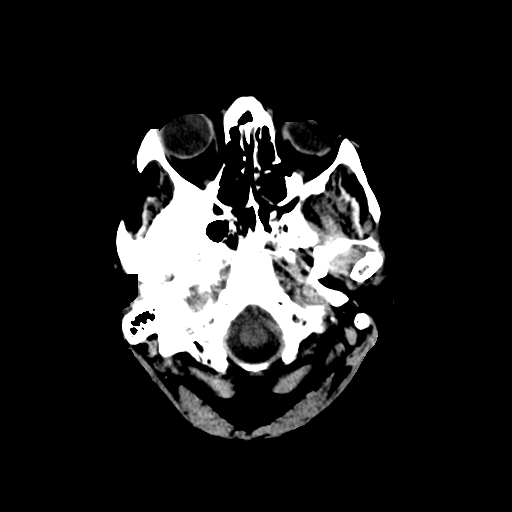
[im 2/28  bone]
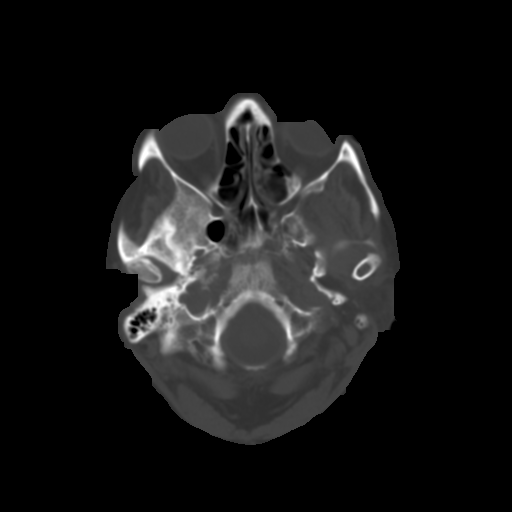
[im 4/28  brain]
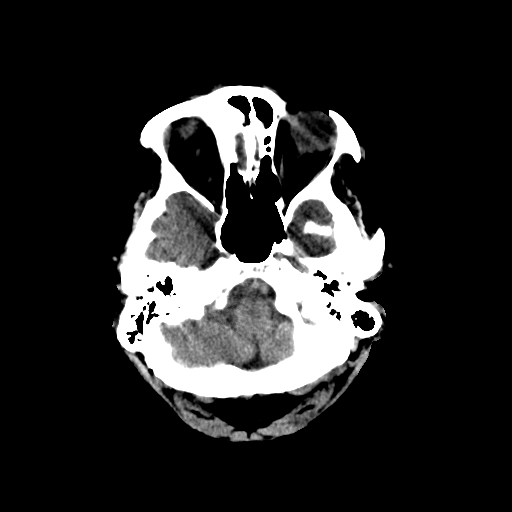
[im 6/28  brain]
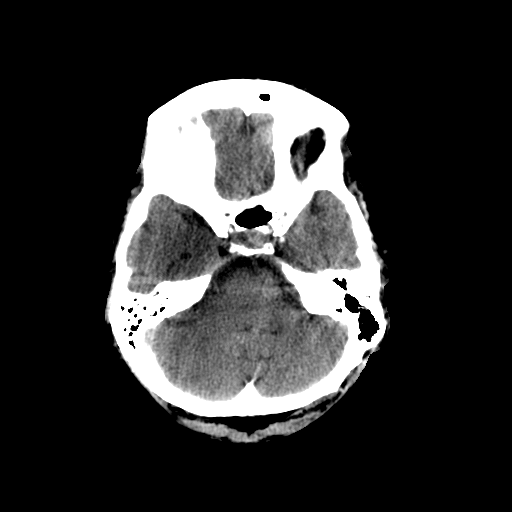
[im 8/28  brain]
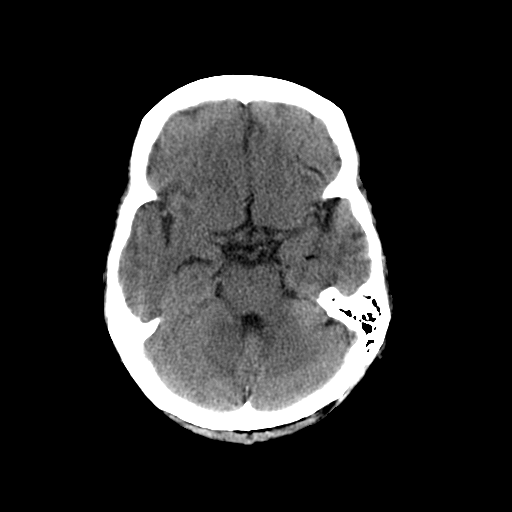
[im 10/28  brain]
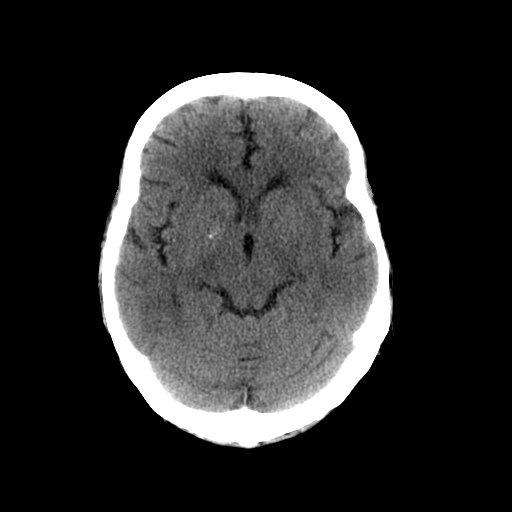
[im 10/28  bone]
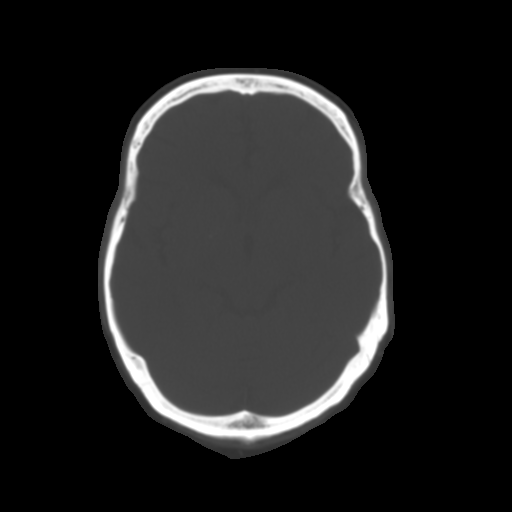
[im 12/28  brain]
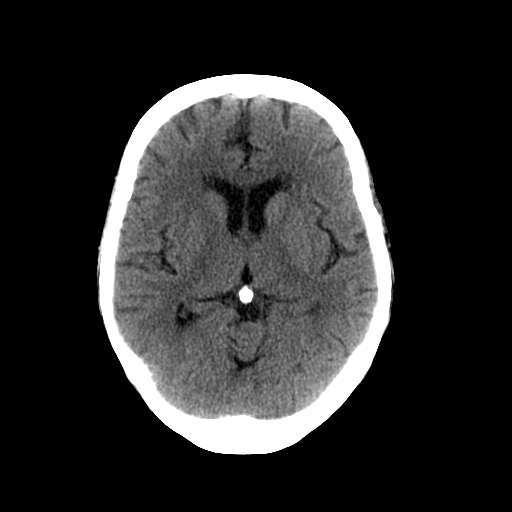
[im 14/28  brain]
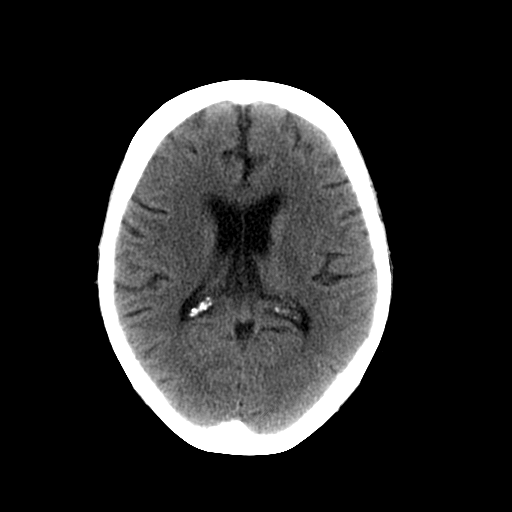
[im 16/28  brain]
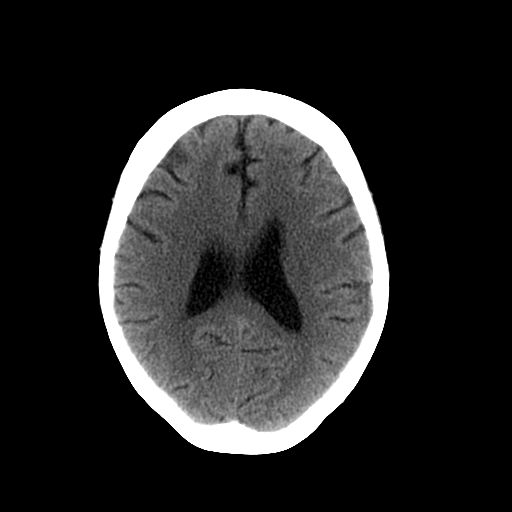
[im 18/28  brain]
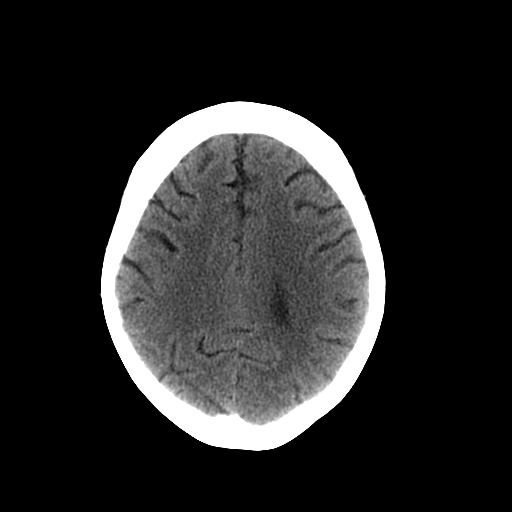
[im 18/28  bone]
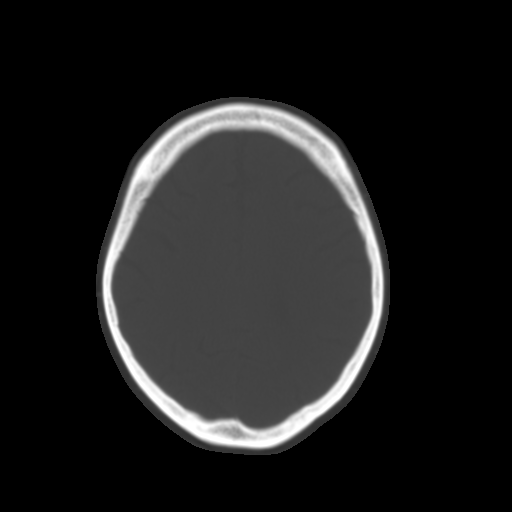
[im 20/28  brain]
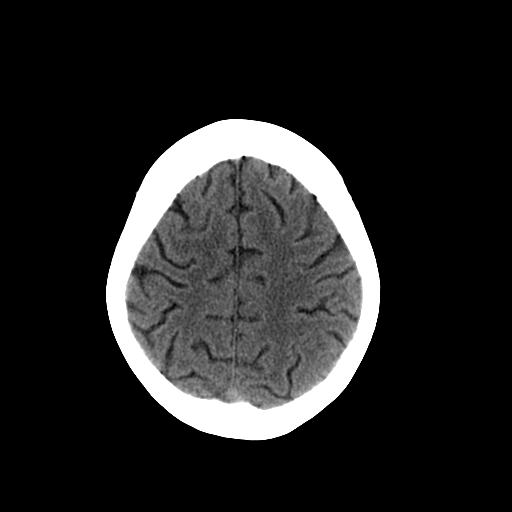
[im 22/28  brain]
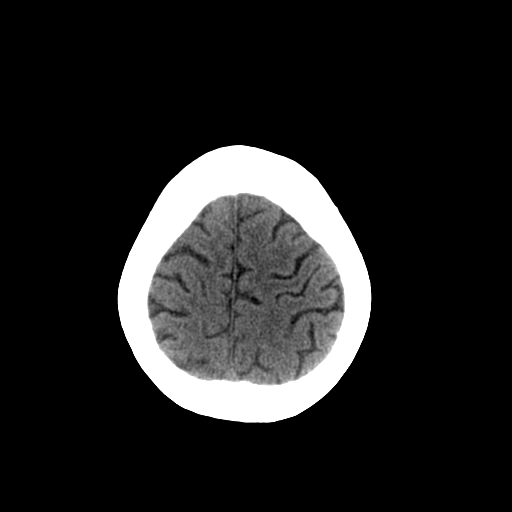
[im 24/28  brain]
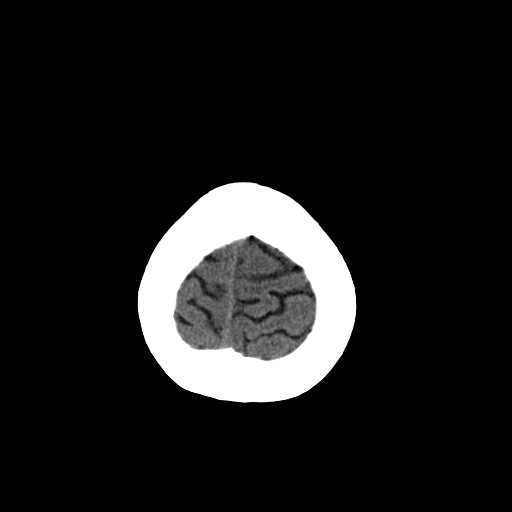
[im 26/28  brain]
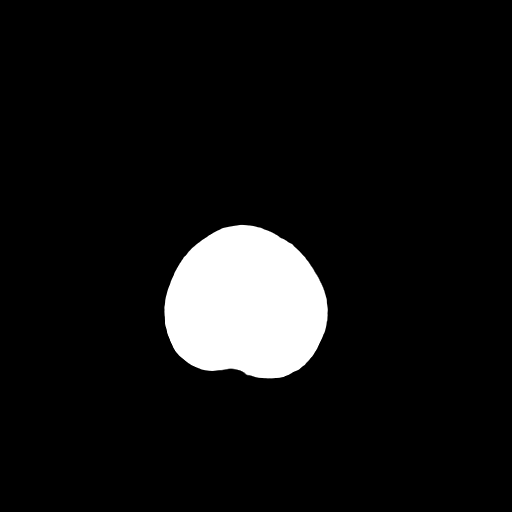
[im 26/28  bone]
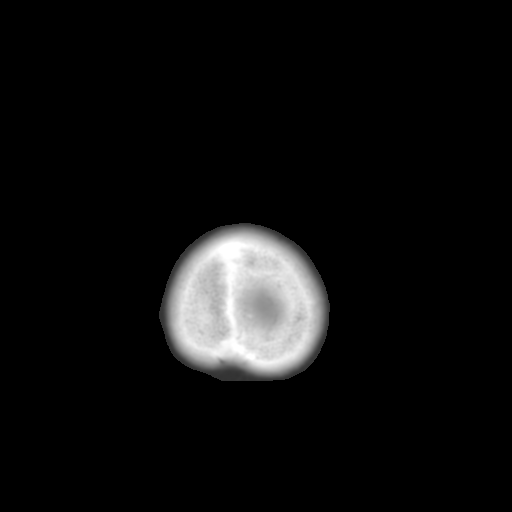

[13 of 30 positions shown; findings below may reference images not displayed]

FINDINGS: No acute intracranial hemorrhage.  No focal mass lesion.
No CT evidence of acute infarction.   No midline shift or mass
effect.  No hydrocephalus.  Basilar cisterns are patent. Paranasal
sinuses and mastoid air cells are clear.  Orbits are normal.
IMPRESSION: No acute intracranial findings.  No change from prior.

## 2013-12-09 ENCOUNTER — Other Ambulatory Visit: Payer: Self-pay

## 2013-12-09 NOTE — Telephone Encounter (Signed)
Last seen 06/24/13 [name]  Before that [name] 03/02/13  Last INR 07/22/13

## 2013-12-09 NOTE — Telephone Encounter (Signed)
Last seen 06/24/13 [name]  Last INR 07/22/13

## 2014-01-03 ENCOUNTER — Other Ambulatory Visit: Payer: Self-pay | Admitting: Family Medicine

## 2014-01-03 ENCOUNTER — Other Ambulatory Visit: Payer: Self-pay | Admitting: General Practice

## 2014-01-05 ENCOUNTER — Other Ambulatory Visit: Payer: Self-pay | Admitting: *Deleted

## 2014-01-11 ENCOUNTER — Other Ambulatory Visit (HOSPITAL_COMMUNITY): Payer: Self-pay | Admitting: Internal Medicine

## 2014-01-11 ENCOUNTER — Other Ambulatory Visit (HOSPITAL_COMMUNITY): Payer: Self-pay | Admitting: Unknown Physician Specialty

## 2014-01-11 DIAGNOSIS — I739 Peripheral vascular disease, unspecified: Secondary | ICD-10-CM

## 2014-01-12 ENCOUNTER — Ambulatory Visit (HOSPITAL_COMMUNITY)
Admission: RE | Admit: 2014-01-12 | Discharge: 2014-01-12 | Disposition: A | Discharge status: Home / Self Care | Payer: Medicare PPO | Source: Ambulatory Visit | Attending: Internal Medicine | Admitting: Internal Medicine

## 2014-01-12 DIAGNOSIS — E669 Obesity, unspecified: Secondary | ICD-10-CM

## 2014-01-12 DIAGNOSIS — I739 Peripheral vascular disease, unspecified: Secondary | ICD-10-CM | POA: Insufficient documentation

## 2014-01-12 DIAGNOSIS — M79609 Pain in unspecified limb: Secondary | ICD-10-CM

## 2014-01-12 NOTE — Progress Notes (Addendum)
VASCULAR LAB PRELIMINARY  ARTERIAL  ABI completed:    RIGHT    LEFT    PRESSURE WAVEFORM  PRESSURE WAVEFORM  BRACHIAL  biphasic BRACHIAL 136 biphasic  DP   DP    AT 109 biphasic AT 129 Dampened monophasic  PT 139 triphasic PT 141 triphasic  PER   PER    GREAT TOE  NA GREAT TOE  NA    RIGHT LEFT  ABI >1.0 >1.0   Duplex imaging of the left lower extremity shows 0-19% arterial diameter reduction.  Jahmire Ruffins, RVT 01/12/2014, 1:24 PM

## 2014-03-01 DIAGNOSIS — E1149 Type 2 diabetes mellitus with other diabetic neurological complication: Secondary | ICD-10-CM | POA: Diagnosis not present

## 2014-03-01 DIAGNOSIS — J449 Chronic obstructive pulmonary disease, unspecified: Secondary | ICD-10-CM | POA: Diagnosis not present

## 2014-03-01 DIAGNOSIS — I10 Essential (primary) hypertension: Secondary | ICD-10-CM | POA: Diagnosis not present

## 2014-03-01 DIAGNOSIS — J4489 Other specified chronic obstructive pulmonary disease: Secondary | ICD-10-CM | POA: Diagnosis not present

## 2014-03-01 DIAGNOSIS — I2699 Other pulmonary embolism without acute cor pulmonale: Secondary | ICD-10-CM | POA: Diagnosis not present

## 2014-03-22 ENCOUNTER — Ambulatory Visit (INDEPENDENT_AMBULATORY_CARE_PROVIDER_SITE_OTHER): Payer: Medicare Other

## 2014-03-22 DIAGNOSIS — Z23 Encounter for immunization: Secondary | ICD-10-CM | POA: Diagnosis not present

## 2014-04-19 NOTE — Addendum Note (Signed)
Addended by: Monica BectonHODGES, Hong Timm F on: 04/19/2014 06:31 PM   Modules accepted: Orders

## 2014-04-20 ENCOUNTER — Other Ambulatory Visit (HOSPITAL_COMMUNITY): Payer: Self-pay | Admitting: Cardiology

## 2014-04-20 ENCOUNTER — Other Ambulatory Visit (INDEPENDENT_AMBULATORY_CARE_PROVIDER_SITE_OTHER): Payer: Medicare Other

## 2014-04-20 DIAGNOSIS — E119 Type 2 diabetes mellitus without complications: Secondary | ICD-10-CM | POA: Diagnosis not present

## 2014-04-20 LAB — POCT GLYCOSYLATED HEMOGLOBIN (HGB A1C): Hemoglobin A1C: 7.1

## 2014-04-20 NOTE — Telephone Encounter (Signed)
Rx was sent to pharmacy electronically. 

## 2014-04-20 NOTE — Progress Notes (Signed)
Lab only 

## 2014-04-21 ENCOUNTER — Encounter: Payer: Self-pay | Admitting: Nurse Practitioner

## 2014-04-21 ENCOUNTER — Ambulatory Visit (INDEPENDENT_AMBULATORY_CARE_PROVIDER_SITE_OTHER): Payer: Medicare Other | Admitting: Nurse Practitioner

## 2014-04-21 VITALS — BP 150/72 | HR 47 | Temp 97.7°F | Ht 62.0 in | Wt 193.6 lb

## 2014-04-21 DIAGNOSIS — I701 Atherosclerosis of renal artery: Secondary | ICD-10-CM | POA: Diagnosis not present

## 2014-04-21 DIAGNOSIS — D508 Other iron deficiency anemias: Secondary | ICD-10-CM

## 2014-04-21 DIAGNOSIS — E876 Hypokalemia: Secondary | ICD-10-CM

## 2014-04-21 DIAGNOSIS — Z7901 Long term (current) use of anticoagulants: Secondary | ICD-10-CM | POA: Diagnosis not present

## 2014-04-21 DIAGNOSIS — I10 Essential (primary) hypertension: Secondary | ICD-10-CM

## 2014-04-21 DIAGNOSIS — E1142 Type 2 diabetes mellitus with diabetic polyneuropathy: Secondary | ICD-10-CM | POA: Diagnosis not present

## 2014-04-21 DIAGNOSIS — F418 Other specified anxiety disorders: Secondary | ICD-10-CM

## 2014-04-21 DIAGNOSIS — R635 Abnormal weight gain: Secondary | ICD-10-CM | POA: Diagnosis not present

## 2014-04-21 DIAGNOSIS — F329 Major depressive disorder, single episode, unspecified: Secondary | ICD-10-CM

## 2014-04-21 DIAGNOSIS — E1121 Type 2 diabetes mellitus with diabetic nephropathy: Secondary | ICD-10-CM | POA: Insufficient documentation

## 2014-04-21 DIAGNOSIS — F32A Depression, unspecified: Secondary | ICD-10-CM

## 2014-04-21 DIAGNOSIS — E785 Hyperlipidemia, unspecified: Secondary | ICD-10-CM | POA: Diagnosis not present

## 2014-04-21 DIAGNOSIS — F419 Anxiety disorder, unspecified: Secondary | ICD-10-CM

## 2014-04-21 DIAGNOSIS — K219 Gastro-esophageal reflux disease without esophagitis: Secondary | ICD-10-CM

## 2014-04-21 DIAGNOSIS — E669 Obesity, unspecified: Secondary | ICD-10-CM

## 2014-04-21 DIAGNOSIS — E039 Hypothyroidism, unspecified: Secondary | ICD-10-CM

## 2014-04-21 LAB — MICROALBUMIN, URINE

## 2014-04-21 MED ORDER — METOPROLOL SUCCINATE ER 25 MG PO TB24: 25.0000 mg | ORAL_TABLET | Freq: Every day | ORAL | Status: DC

## 2014-04-21 MED ORDER — PANTOPRAZOLE SODIUM 40 MG PO TBEC: 40.0000 mg | DELAYED_RELEASE_TABLET | Freq: Every day | ORAL | Status: DC

## 2014-04-21 MED ORDER — AMITRIPTYLINE HCL 25 MG PO TABS: 25.0000 mg | ORAL_TABLET | Freq: Every day | ORAL | Status: DC

## 2014-04-21 MED ORDER — GLIMEPIRIDE 1 MG PO TABS: 1.0000 mg | ORAL_TABLET | Freq: Every day | ORAL | Status: DC

## 2014-04-21 MED ORDER — POTASSIUM CHLORIDE CRYS ER 20 MEQ PO TBCR: 20.0000 meq | EXTENDED_RELEASE_TABLET | Freq: Every morning | ORAL | Status: DC

## 2014-04-21 MED ORDER — LEVOTHYROXINE SODIUM 88 MCG PO TABS: ORAL_TABLET | ORAL | Status: DC

## 2014-04-21 MED ORDER — SIMVASTATIN 40 MG PO TABS: 40.0000 mg | ORAL_TABLET | Freq: Every day | ORAL | Status: DC

## 2014-04-21 MED ORDER — FUROSEMIDE 40 MG PO TABS: 20.0000 mg | ORAL_TABLET | Freq: Every day | ORAL | Status: DC

## 2014-04-21 MED ORDER — LOSARTAN POTASSIUM 25 MG PO TABS: 25.0000 mg | ORAL_TABLET | Freq: Every day | ORAL | Status: DC

## 2014-04-21 MED ORDER — FENOFIBRIC ACID 135 MG PO CPDR: 1.0000 | DELAYED_RELEASE_CAPSULE | Freq: Every day | ORAL | Status: DC

## 2014-04-21 MED ORDER — GABAPENTIN 300 MG PO CAPS: 300.0000 mg | ORAL_CAPSULE | Freq: Every evening | ORAL | Status: DC | PRN

## 2014-04-21 NOTE — Patient Instructions (Signed)
Diabetes and Foot Care Diabetes may cause you to have problems because of poor blood supply (circulation) to your feet and legs. This may cause the skin on your feet to become thinner, break easier, and heal more slowly. Your skin may become dry, and the skin may peel and crack. You may also have nerve damage in your legs and feet causing decreased feeling in them. You may not notice minor injuries to your feet that could lead to infections or more serious problems. Taking care of your feet is one of the most important things you can do for yourself.  HOME CARE INSTRUCTIONS  Wear shoes at all times, even in the house. Do not go barefoot. Bare feet are easily injured.  Check your feet daily for blisters, cuts, and redness. If you cannot see the bottom of your feet, use a mirror or ask someone for help.  Wash your feet with warm water (do not use hot water) and mild soap. Then pat your feet and the areas between your toes until they are completely dry. Do not soak your feet as this can dry your skin.  Apply a moisturizing lotion or petroleum jelly (that does not contain alcohol and is unscented) to the skin on your feet and to dry, brittle toenails. Do not apply lotion between your toes.  Trim your toenails straight across. Do not dig under them or around the cuticle. File the edges of your nails with an emery board or nail file.  Do not cut corns or calluses or try to remove them with medicine.  Wear clean socks or stockings every day. Make sure they are not too tight. Do not wear knee-high stockings since they may decrease blood flow to your legs.  Wear shoes that fit properly and have enough cushioning. To break in new shoes, wear them for just a few hours a day. This prevents you from injuring your feet. Always look in your shoes before you put them on to be sure there are no objects inside.  Do not cross your legs. This may decrease the blood flow to your feet.  If you find a minor scrape,  cut, or break in the skin on your feet, keep it and the skin around it clean and dry. These areas may be cleansed with mild soap and water. Do not cleanse the area with peroxide, alcohol, or iodine.  When you remove an adhesive bandage, be sure not to damage the skin around it.  If you have a wound, look at it several times a day to make sure it is healing.  Do not use heating pads or hot water bottles. They may burn your skin. If you have lost feeling in your feet or legs, you may not know it is happening until it is too late.  Make sure your health care provider performs a complete foot exam at least annually or more often if you have foot problems. Report any cuts, sores, or bruises to your health care provider immediately. SEEK MEDICAL CARE IF:   You have an injury that is not healing.  You have cuts or breaks in the skin.  You have an ingrown nail.  You notice redness on your legs or feet.  You feel burning or tingling in your legs or feet.  You have pain or cramps in your legs and feet.  Your legs or feet are numb.  Your feet always feel cold. SEEK IMMEDIATE MEDICAL CARE IF:   There is increasing redness,   swelling, or pain in or around a wound.  There is a red line that goes up your leg.  Pus is coming from a wound.  You develop a fever or as directed by your health care provider.  You notice a bad smell coming from an ulcer or wound. Document Released: 05/24/2000 Document Revised: 01/27/2013 Document Reviewed: 11/03/2012 Exit[employer] Patient Information 2015 [employer]. This information is not intended to replace advice given to you by your health care provider. Make sure you discuss any questions you have with your health care provider.  

## 2014-04-21 NOTE — Progress Notes (Signed)
Subjective:    Patient ID: [name], female    DOB: [birth year], 68 y.o.   MRN: [id number]  HPI Patient in today for follow up of chronic medical problems. She says that she is doing well. SHe has dementia and sometimes comes in here talking out of her head. On occasion she has come in and has taken to many mmeds and she is asleep in exam room. She is well alert today.  Patient Active Problem List   Diagnosis Date Noted  . Obesity (BMI 30-39.9) 03/25/2013  . Chronic anticoagulation, pt will need lifelong anticoagulation 08/20/2011  . Pulmonary HTN on CTA this admission 08/20/2011  . Anxiety and depression, ( ? early dementia) 08/14/2011  . Abnormal chest x-ray 12/10/2010  . Palpitations 12/10/2010  . Hyperlipidemia with target LDL less than 100 11/23/2008  . Essential hypertension, benign 11/23/2008  . CHEST PAIN, PRECORDIAL,  11/23/2008  . Diabetes 11/19/2008  . Anemia 11/19/2008  . PE. history of PE in 2007, [facility] most likely cause of troponin leak.08/14/11  11/19/2008  . COPD, on home O2  "for 24yrs" 11/19/2008   Outpatient Encounter Prescriptions as of 04/21/2014  Medication Sig  . albuterol (PROVENTIL HFA;VENTOLIN HFA) 108 (90 BASE) MCG/ACT inhaler Inhale 2 puffs into the lungs every 6 (six) hours as needed. For shortness of breath  . amitriptyline (ELAVIL) 25 MG tablet Take 25 mg by mouth at bedtime.    . Cholecalciferol (VITAMIN D3) 5000 UNITS CAPS Take 5,000 Units by mouth daily.  . Choline Fenofibrate (FENOFIBRIC ACID) 135 MG CPDR Take 1 capsule by mouth daily.  . fish oil-omega-3 fatty acids 1000 MG capsule Take 1 g by mouth every morning.   . furosemide (LASIX) 40 MG tablet Take 0.5 tablets (20 mg total) by mouth daily.  Marland Kitchen gabapentin (NEURONTIN) 300 MG capsule Take 1 capsule (300 mg total) by mouth at bedtime and may repeat dose one time if needed.  Marland Kitchen glimepiride (AMARYL) 1 MG tablet Take 1 mg by mouth daily with breakfast.  . ipratropium-albuterol  (DUONEB) 0.5-2.5 (3) MG/3ML SOLN Take 3 mLs by nebulization every 4 (four) hours as needed. For shortness of breath  . levothyroxine (SYNTHROID, LEVOTHROID) 88 MCG tablet TAKE (1) TABLET ONCE DAILY IN THE MORNING BEFORE BREAKFAST.  Marland Kitchen losartan (COZAAR) 25 MG tablet Take 1 tablet (25 mg total) by mouth daily. <please make appointment for refills>  . meclizine (ANTIVERT) 50 MG tablet Take 50 mg by mouth 3 (three) times daily as needed for dizziness.  . metoprolol succinate (TOPROL-XL) 25 MG 24 hr tablet Take 1 tablet (25 mg total) by mouth daily.  Marland Kitchen OVER THE COUNTER MEDICATION Take 1-3 tablets by mouth daily. Weight Loss Formula with Apple Cider Vinegar, Kelp, Lecithin, and B-6  . pantoprazole (PROTONIX) 40 MG tablet Take 1 tablet (40 mg total) by mouth daily at 6 (six) AM.  . potassium chloride SA (K-DUR,KLOR-CON) 20 MEQ tablet Take 1 tablet (20 mEq total) by mouth every morning.  . simvastatin (ZOCOR) 40 MG tablet Take 1 tablet (40 mg total) by mouth at bedtime.  . traMADol (ULTRAM) 50 MG tablet Take 50 mg by mouth every 6 (six) hours as needed.  . warfarin (COUMADIN) 5 MG tablet Take 0.5 tablets (2.5 mg total) by mouth every evening.       Review of Systems  Constitutional: Negative.   HENT: Negative.   Respiratory: Negative.   Cardiovascular: Negative.   Gastrointestinal: Negative.   Genitourinary: Negative.   Neurological:  Negative.   Psychiatric/Behavioral: Negative.   All other systems reviewed and are negative.      Objective:   Physical Exam  Constitutional: She is oriented to person, place, and time. She appears well-developed and well-nourished.  HENT:  Nose: Nose normal.  Mouth/Throat: Oropharynx is clear and moist.  Eyes: EOM are normal.  Neck: Trachea normal, normal range of motion and full passive range of motion without pain. Neck supple. No JVD present. Carotid bruit is not present. No thyromegaly present.  Cardiovascular: Normal rate, regular rhythm, normal heart  sounds and intact distal pulses.  Exam reveals no gallop and no friction rub.   No murmur heard. Pulmonary/Chest: Effort normal and breath sounds normal.  Abdominal: Soft. Bowel sounds are normal. She exhibits no distension and no mass. There is no tenderness.  Musculoskeletal: Normal range of motion.  Lymphadenopathy:    She has no cervical adenopathy.  Neurological: She is alert and oriented to person, place, and time. She has normal reflexes.  Skin: Skin is warm and dry.  Psychiatric: She has a normal mood and affect. Her behavior is normal. Judgment and thought content normal.   BP 150/72 mmHg  Pulse 47  Temp(Src) 97.7 F (36.5 C) (Oral)  Ht 5\' 2"  (1.575 m)  Wt 193 lb 9.6 oz (87.816 kg)  BMI 35.40 kg/m2  Results for orders placed or performed in visit on 04/20/14  Microalbumin, urine  Result Value Ref Range   Microalbum.,U,Random <3.0 0.0 - 17.0 ug/mL  POCT glycosylated hemoglobin (Hb A1C)  Result Value Ref Range   Hemoglobin A1C 7.1          Assessment & Plan:  1. Essential hypertension, benign Low NA+ diet - furosemide (LASIX) 40 MG tablet; Take 0.5 tablets (20 mg total) by mouth daily.  Dispense: 30 tablet; Refill: 0 - losartan (COZAAR) 25 MG tablet; Take 1 tablet (25 mg total) by mouth daily. <please make appointment for refills>  Dispense: 30 tablet; Refill: 0 - metoprolol succinate (TOPROL-XL) 25 MG 24 hr tablet; Take 1 tablet (25 mg total) by mouth daily.  Dispense: 30 tablet; Refill: 0  2. Anxiety and depression, ( ? early dementia) Stress =management - amitriptyline (ELAVIL) 25 MG tablet; Take 1 tablet (25 mg total) by mouth at bedtime.  Dispense: 30 tablet; Refill: 3  3. Obesity (BMI 30-39.9) Discussed diet and exercise for person with BMI >25 Will recheck weight in 3-6 months   4. Hyperlipidemia with target LDL less than 100 Watch fats in diet - Choline Fenofibrate (FENOFIBRIC ACID) 135 MG CPDR; Take 1 capsule by mouth daily.  Dispense: 30 capsule;  Refill: 0 - simvastatin (ZOCOR) 40 MG tablet; Take 1 tablet (40 mg total) by mouth at bedtime.  Dispense: 30 tablet; Refill: 0  5. Other iron deficiency anemias Vitamins OTC with iron  6. Chronic anticoagulation, pt will need lifelong anticoagulation Watch for signs of bleeding  7. Type 2 diabetes mellitus with diabetic polyneuropathy Low carb diet - gabapentin (NEURONTIN) 300 MG capsule; Take 1 capsule (300 mg total) by mouth at bedtime and may repeat dose one time if needed.  Dispense: 30 capsule; Refill: 0 - glimepiride (AMARYL) 1 MG tablet; Take 1 tablet (1 mg total) by mouth daily with breakfast.  Dispense: 30 tablet; Refill: 5  8. Hypothyroidism, unspecified hypothyroidism type - levothyroxine (SYNTHROID, LEVOTHROID) 88 MCG tablet; TAKE (1) TABLET ONCE DAILY IN THE MORNING BEFORE BREAKFAST.  Dispense: 30 tablet; Refill: 5  9. Gastroesophageal reflux disease without esophagitis Avoid spicy  and fatty foods - pantoprazole (PROTONIX) 40 MG tablet; Take 1 tablet (40 mg total) by mouth daily at 6 (six) AM.  Dispense: 30 tablet; Refill: 0  10. Hypokalemia - potassium chloride SA (K-DUR,KLOR-CON) 20 MEQ tablet; Take 1 tablet (20 mEq total) by mouth every morning.  Dispense: 30 tablet; Refill: 0    Labs pending Health maintenance reviewed Diet and exercise encouraged Continue all meds Follow up  In 3 month   Mary-Margaret Daphine DeutscherMartin, FNP

## 2014-05-19 ENCOUNTER — Encounter (HOSPITAL_COMMUNITY): Payer: Self-pay | Admitting: Cardiology

## 2014-06-05 ENCOUNTER — Other Ambulatory Visit: Payer: Self-pay | Admitting: Nurse Practitioner

## 2014-06-06 ENCOUNTER — Other Ambulatory Visit: Payer: Self-pay | Admitting: Nurse Practitioner

## 2014-06-13 ENCOUNTER — Telehealth: Payer: Self-pay | Admitting: Nurse Practitioner

## 2014-06-13 ENCOUNTER — Encounter: Payer: Self-pay | Admitting: Nurse Practitioner

## 2014-06-13 NOTE — Telephone Encounter (Signed)
Aware, letter ready. 

## 2014-06-13 NOTE — Telephone Encounter (Signed)
Letter ready for pick up

## 2014-06-16 ENCOUNTER — Telehealth: Payer: Self-pay | Admitting: Nurse Practitioner

## 2014-06-16 MED ORDER — IPRATROPIUM-ALBUTEROL 0.5-2.5 (3) MG/3ML IN SOLN: 3.0000 mL | RESPIRATORY_TRACT | Status: DC | PRN

## 2014-06-16 NOTE — Telephone Encounter (Signed)
Per [name] okay to refill meds [facility]

## 2014-06-29 ENCOUNTER — Other Ambulatory Visit: Payer: Self-pay | Admitting: Nurse Practitioner

## 2014-07-06 ENCOUNTER — Other Ambulatory Visit: Payer: Self-pay | Admitting: Nurse Practitioner

## 2014-07-07 NOTE — Telephone Encounter (Signed)
Please call in valium with 1 refills 

## 2014-07-07 NOTE — Telephone Encounter (Signed)
Last seen 04/21/14 [name]  Last lipid 01/13/13

## 2014-07-08 NOTE — Telephone Encounter (Signed)
rx called into pharmacy

## 2014-07-20 ENCOUNTER — Other Ambulatory Visit: Payer: Self-pay | Admitting: Nurse Practitioner

## 2014-07-21 ENCOUNTER — Other Ambulatory Visit: Payer: Self-pay | Admitting: *Deleted

## 2014-07-21 MED ORDER — GABAPENTIN 300 MG PO CAPS: ORAL_CAPSULE | ORAL | Status: DC

## 2014-07-25 ENCOUNTER — Ambulatory Visit: Payer: Medicare Other | Admitting: Nurse Practitioner

## 2014-07-26 ENCOUNTER — Telehealth: Payer: Self-pay | Admitting: Nurse Practitioner

## 2014-07-26 ENCOUNTER — Other Ambulatory Visit: Payer: Self-pay | Admitting: Nurse Practitioner

## 2014-07-26 NOTE — Telephone Encounter (Signed)
Appointment rescheduled for Friday with Paulene FloorMary Martin, FNP

## 2014-07-29 ENCOUNTER — Encounter: Payer: Self-pay | Admitting: Nurse Practitioner

## 2014-07-29 ENCOUNTER — Ambulatory Visit (INDEPENDENT_AMBULATORY_CARE_PROVIDER_SITE_OTHER): Payer: Medicare Other | Admitting: Nurse Practitioner

## 2014-07-29 VITALS — BP 138/68 | HR 58 | Temp 97.0°F | Ht 62.0 in | Wt 188.0 lb

## 2014-07-29 DIAGNOSIS — E1142 Type 2 diabetes mellitus with diabetic polyneuropathy: Secondary | ICD-10-CM

## 2014-07-29 DIAGNOSIS — K219 Gastro-esophageal reflux disease without esophagitis: Secondary | ICD-10-CM

## 2014-07-29 DIAGNOSIS — J449 Chronic obstructive pulmonary disease, unspecified: Secondary | ICD-10-CM | POA: Diagnosis not present

## 2014-07-29 DIAGNOSIS — I10 Essential (primary) hypertension: Secondary | ICD-10-CM | POA: Diagnosis not present

## 2014-07-29 DIAGNOSIS — Z86711 Personal history of pulmonary embolism: Secondary | ICD-10-CM

## 2014-07-29 DIAGNOSIS — I2510 Atherosclerotic heart disease of native coronary artery without angina pectoris: Secondary | ICD-10-CM | POA: Diagnosis not present

## 2014-07-29 DIAGNOSIS — E785 Hyperlipidemia, unspecified: Secondary | ICD-10-CM

## 2014-07-29 DIAGNOSIS — F419 Anxiety disorder, unspecified: Secondary | ICD-10-CM

## 2014-07-29 DIAGNOSIS — E876 Hypokalemia: Secondary | ICD-10-CM | POA: Diagnosis not present

## 2014-07-29 DIAGNOSIS — F418 Other specified anxiety disorders: Secondary | ICD-10-CM | POA: Diagnosis not present

## 2014-07-29 DIAGNOSIS — E039 Hypothyroidism, unspecified: Secondary | ICD-10-CM | POA: Diagnosis not present

## 2014-07-29 DIAGNOSIS — E669 Obesity, unspecified: Secondary | ICD-10-CM | POA: Diagnosis not present

## 2014-07-29 DIAGNOSIS — F329 Major depressive disorder, single episode, unspecified: Secondary | ICD-10-CM

## 2014-07-29 LAB — POCT GLYCOSYLATED HEMOGLOBIN (HGB A1C): Hemoglobin A1C: 7.2

## 2014-07-29 LAB — PROTIME-INR: INR: 3.5 — AB (ref <=1.1)

## 2014-07-29 MED ORDER — POTASSIUM CHLORIDE CRYS ER 20 MEQ PO TBCR: 20.0000 meq | EXTENDED_RELEASE_TABLET | Freq: Every morning | ORAL | Status: DC

## 2014-07-29 MED ORDER — FENOFIBRIC ACID 135 MG PO CPDR: 1.0000 | DELAYED_RELEASE_CAPSULE | Freq: Every day | ORAL | Status: DC

## 2014-07-29 MED ORDER — DIAZEPAM 10 MG PO TABS: ORAL_TABLET | ORAL | Status: DC

## 2014-07-29 MED ORDER — METOPROLOL SUCCINATE ER 25 MG PO TB24: ORAL_TABLET | ORAL | Status: DC

## 2014-07-29 MED ORDER — SIMVASTATIN 40 MG PO TABS: 40.0000 mg | ORAL_TABLET | Freq: Every day | ORAL | Status: DC

## 2014-07-29 MED ORDER — WARFARIN SODIUM 5 MG PO TABS: 2.5000 mg | ORAL_TABLET | Freq: Every evening | ORAL | Status: DC

## 2014-07-29 MED ORDER — GABAPENTIN 300 MG PO CAPS: ORAL_CAPSULE | ORAL | Status: DC

## 2014-07-29 MED ORDER — LEVOTHYROXINE SODIUM 88 MCG PO TABS: ORAL_TABLET | ORAL | Status: DC

## 2014-07-29 MED ORDER — FUROSEMIDE 40 MG PO TABS: 20.0000 mg | ORAL_TABLET | Freq: Every day | ORAL | Status: DC

## 2014-07-29 MED ORDER — PANTOPRAZOLE SODIUM 40 MG PO TBEC: 40.0000 mg | DELAYED_RELEASE_TABLET | Freq: Every day | ORAL | Status: DC

## 2014-07-29 MED ORDER — AMITRIPTYLINE HCL 25 MG PO TABS: 25.0000 mg | ORAL_TABLET | Freq: Every day | ORAL | Status: DC

## 2014-07-29 MED ORDER — LOSARTAN POTASSIUM 25 MG PO TABS: 25.0000 mg | ORAL_TABLET | Freq: Every day | ORAL | Status: DC

## 2014-07-29 NOTE — Patient Instructions (Signed)
Anticoagulation Dose Instructions as of 07/29/2014      Glynis SmilesSun Mon Tue Wed Thu Fri Sat   New Dose 5 mg 5 mg 5 mg 2.5 mg 5 mg 5 mg 2.5 mg    Description        Hold today then 1/2 tablet on saturdays and Wednesday- recheck in 2 weeks     Recheck in 2 weeks

## 2014-07-29 NOTE — Progress Notes (Signed)
Subjective:    Patient ID: [name], female    DOB: [birth year], 69 y.o.   MRN: [id number]  Hypertension This is a chronic problem. The current episode started more than 1 year ago. The problem is unchanged. The problem is controlled. Pertinent negatives include no chest pain, headaches, neck pain, palpitations, peripheral edema or shortness of breath. Risk factors for coronary artery disease include dyslipidemia, diabetes mellitus, obesity, post-menopausal state and sedentary lifestyle. Past treatments include beta blockers, diuretics and angiotensin blockers. The current treatment provides moderate improvement. Compliance problems include diet and exercise.  Hypertensive end-organ damage includes CAD/MI.  Hyperlipidemia This is a chronic problem. The current episode started more than 1 year ago. The problem is controlled. Recent lipid tests were reviewed and are variable. Pertinent negatives include no chest pain or shortness of breath. Current antihyperlipidemic treatment includes statins and fibric acid derivatives. The current treatment provides moderate improvement of lipids. Compliance problems include adherence to diet and adherence to exercise.  Risk factors for coronary artery disease include diabetes mellitus, dyslipidemia, hypertension, post-menopausal and a sedentary lifestyle.  Diabetes She presents for her follow-up diabetic visit. She has type 2 diabetes mellitus. No MedicAlert identification noted. Her disease course has been stable. Pertinent negatives for hypoglycemia include no headaches. Pertinent negatives for diabetes include no chest pain. There are no hypoglycemic complications. There are no diabetic complications. Current diabetic treatment includes oral agent (monotherapy). She is compliant with treatment all of the time. Her weight is stable. When asked about meal planning, she reported none. She has not had a previous visit with a dietitian. (Very seldom checks her  blood sugars) An ACE inhibitor/angiotensin II receptor blocker is being taken. She does not see a podiatrist.Eye exam is not current.  diabetic neuropathy neurotin works okay- still has a slight burning sensation in feet COPD duoneb as needed- says she is doing well. GERD protonix works well to keep symptoms under control hypokalemia kdur daily- no c/o lower extremity cramping hypothyroidism Synthroid daily-  No complaints GAD Valium 2-3 x a day- also takes amitriptyline which helps keep her calm Hx Pulmonary emboli On coumadin but has not had INR checked in over a year  Review of Systems  Constitutional: Negative.   HENT: Negative.   Respiratory: Negative for shortness of breath.   Cardiovascular: Negative for chest pain and palpitations.  Genitourinary: Negative.   Musculoskeletal: Negative for neck pain.  Neurological: Negative for headaches.  Psychiatric/Behavioral: Negative.   All other systems reviewed and are negative.      Objective:   Physical Exam  Constitutional: She is oriented to person, place, and time. She appears well-developed and well-nourished.  HENT:  Nose: Nose normal.  Mouth/Throat: Oropharynx is clear and moist.  Eyes: EOM are normal.  Neck: Trachea normal, normal range of motion and full passive range of motion without pain. Neck supple. No JVD present. Carotid bruit is not present. No thyromegaly present.  Cardiovascular: Normal rate, regular rhythm, normal heart sounds and intact distal pulses.  Exam reveals no gallop and no friction rub.   No murmur heard. Pulmonary/Chest: Effort normal and breath sounds normal.  Abdominal: Soft. Bowel sounds are normal. She exhibits no distension and no mass. There is no tenderness.  Musculoskeletal: Normal range of motion.  Lymphadenopathy:    She has no cervical adenopathy.  Neurological: She is alert and oriented to person, place, and time. She has normal reflexes.  Skin: Skin is warm and dry.  Psychiatric:  She has a  normal mood and affect. Her behavior is normal. Judgment and thought content normal.    BP 138/68 mmHg  Pulse 58  Temp(Src) 97 F (36.1 C) (Oral)  Ht _0  (1.575 m)  Wt 188 lb (85.276 kg)  BMI 34.38 kg/m2  Results for orders placed or performed in visit on 07/29/14  Protime-INR  Result Value Ref Range   INR 3.5 (A) .9 - 1.1  POCT glycosylated hemoglobin (Hb A1C)  Result Value Ref Range   Hemoglobin A1C 7.2           Assessment & Plan:  1. Essential hypertension, benign Do not add slat to diet - CMP14+EGFR - furosemide (LASIX) 40 MG tablet; Take 0.5 tablets (20 mg total) by mouth daily.  Dispense: 15 tablet; Refill: 3 - losartan (COZAAR) 25 MG tablet; Take 1 tablet (25 mg total) by mouth daily.  Dispense: 30 tablet; Refill: 5  2. Hyperlipidemia with target LDL less than 100 Low fat diet - NMR, lipoprofile - Choline Fenofibrate (FENOFIBRIC ACID) 135 MG CPDR; Take 1 tablet by mouth daily.  Dispense: 30 capsule; Refill: 2 - simvastatin (ZOCOR) 40 MG tablet; Take 1 tablet (40 mg total) by mouth at bedtime.  Dispense: 30 tablet; Refill: 5  3. Type 2 diabetes mellitus with diabetic polyneuropathy Continue ti watch carbs - POCT glycosylated hemoglobin (Hb A1C)  4. COPD mixed type  5. Obesity (BMI 30-39.9) Discussed diet and exercise for person with BMI >25 Will recheck weight in 3-6 months   6. Anxiety and depression, ( ? early dementia) Stress management - amitriptyline (ELAVIL) 25 MG tablet; Take 1 tablet (25 mg total) by mouth at bedtime.  Dispense: 30 tablet; Refill: 5 - diazepam (VALIUM) 10 MG tablet; TAKE 1 TABLET BY MOUTH DAILY AS NEEDED.  Dispense: 32 tablet; Refill: 1  7. Hypothyroidism, unspecified hypothyroidism type  - levothyroxine (SYNTHROID, LEVOTHROID) 88 MCG tablet; TAKE (1) TABLET ONCE DAILY IN THE MORNING BEFORE BREAKFAST.  Dispense: 30 tablet; Refill: 5  8. Hypokalemia - potassium chloride SA (K-DUR,KLOR-CON) 20 MEQ tablet; Take 1  tablet (20 mEq total) by mouth every morning.  Dispense: 30 tablet; Refill: 5  9. Gastroesophageal reflux disease without esophagitis Avoid spicy foods Do  Not eat 2 hours prior to bedtime - pantoprazole (PROTONIX) 40 MG tablet; Take 1 tablet (40 mg total) by mouth daily.  Dispense: 30 tablet; Refill: 5  10. Hx pulmonary embolism Need INR checked at least every 6 weeks - warfarin (COUMADIN) 5 MG tablet; Take 0.5 tablets (2.5 mg total) by mouth every evening.  Dispense: 5 tablet; Refill: 5 - POCT INR; Standing  11. Diabetic polyneuropathy associated with type 2 diabetes mellitus - gabapentin (NEURONTIN) 300 MG capsule; TAKE 1 CAPSULE BY MOUTH AT BEDTIME AND MAY REPEAT DOSE ONE TIME IF NEEDED.  Dispense: 30 capsule; Refill: 1  12. Coronary artery disease involving native coronary artery of native heart without angina pectoris - metoprolol succinate (TOPROL-XL) 25 MG 24 hr tablet; TAKE (1) TABLET BY MOUTH ONCE DAILY.  Dispense: 30 tablet; Refill: 5    Labs pending Health maintenance reviewed Diet and exercise encouraged Continue all meds Follow up  In 3 months   Scott, FNP

## 2014-07-30 LAB — CMP14+EGFR
ALBUMIN: 4 g/dL (ref 3.6–4.8)
ALT: 15 IU/L (ref 0–32)
AST: 18 IU/L (ref 0–40)
Albumin/Globulin Ratio: 1.7 (ref 1.1–2.5)
Alkaline Phosphatase: 45 IU/L (ref 39–117)
BUN/Creatinine Ratio: 17 (ref 11–26)
BUN: 18 mg/dL (ref 8–27)
Bilirubin Total: 0.5 mg/dL (ref 0.0–1.2)
CALCIUM: 9.4 mg/dL (ref 8.7–10.3)
CHLORIDE: 102 mmol/L (ref 97–108)
CO2: 25 mmol/L (ref 18–29)
CREATININE: 1.06 mg/dL — AB (ref 0.57–1.00)
GFR calc Af Amer: 63 mL/min/{1.73_m2} (ref >59)
GFR calc non Af Amer: 55 mL/min/{1.73_m2} — ABNORMAL LOW (ref >59)
GLUCOSE: 142 mg/dL — AB (ref 65–99)
Globulin, Total: 2.3 g/dL (ref 1.5–4.5)
Potassium: 4.1 mmol/L (ref 3.5–5.2)
SODIUM: 142 mmol/L (ref 134–144)
Total Protein: 6.3 g/dL (ref 6.0–8.5)

## 2014-07-30 LAB — NMR, LIPOPROFILE
Cholesterol: 149 mg/dL (ref 100–199)
HDL Cholesterol by NMR: 63 mg/dL (ref >39)
HDL PARTICLE NUMBER: 39.9 umol/L (ref >=30.5)
LDL Particle Number: 882 nmol/L (ref <1000)
LDL SIZE: 21.2 nm (ref >20.5)
LDL-C: 62 mg/dL (ref 0–99)
LP-IR SCORE: 53 — AB (ref <=45)
Small LDL Particle Number: 381 nmol/L (ref <=527)
TRIGLYCERIDES BY NMR: 121 mg/dL (ref 0–149)

## 2014-08-01 ENCOUNTER — Telehealth: Payer: Self-pay | Admitting: Nurse Practitioner

## 2014-08-01 NOTE — Telephone Encounter (Signed)
FYI

## 2014-08-17 ENCOUNTER — Encounter: Payer: Self-pay | Admitting: Nurse Practitioner

## 2014-08-17 ENCOUNTER — Ambulatory Visit (INDEPENDENT_AMBULATORY_CARE_PROVIDER_SITE_OTHER): Payer: Medicare Other | Admitting: Nurse Practitioner

## 2014-08-17 DIAGNOSIS — Z86711 Personal history of pulmonary embolism: Secondary | ICD-10-CM | POA: Diagnosis not present

## 2014-08-17 DIAGNOSIS — I2510 Atherosclerotic heart disease of native coronary artery without angina pectoris: Secondary | ICD-10-CM

## 2014-08-17 LAB — POCT INR: INR: 2.6

## 2014-08-17 NOTE — Progress Notes (Signed)
   Subjective:    Patient ID: [name], female    DOB: [birth year], 69 y.o.   MRN: [id number]  HPI Patient is here today for a protime recheck. She was seen 2 weeks ago and her INR was 3.5. She did not take her coumadin for 3 days.    Review of Systems  Constitutional: Negative.   HENT: Negative.   Respiratory: Negative.   Cardiovascular: Negative.   Gastrointestinal: Negative.   Endocrine: Negative.   Genitourinary: Negative.   Musculoskeletal: Negative.   Skin: Negative.   Allergic/Immunologic: Negative.   Neurological: Negative.   Hematological: Negative.   Psychiatric/Behavioral: Negative.   All other systems reviewed and are negative.      Objective:   Physical Exam  Constitutional: She is oriented to person, place, and time. She appears well-developed.  HENT:  Head: Normocephalic.  Eyes: Pupils are equal, round, and reactive to light.  Neck: Normal range of motion.  Cardiovascular: Normal rate and normal heart sounds.   Pulmonary/Chest: Effort normal.  Neurological: She is alert and oriented to person, place, and time.  Skin: Skin is warm.  Psychiatric: She has a normal mood and affect.    BP 131/73 mmHg  Pulse 67  Temp(Src) 98.2 F (36.8 C) (Oral)  Ht 5\' 2"  (1.575 m)  Wt 184 lb (83.462 kg)  BMI 33.65 kg/m2  Results for orders placed or performed in visit on 08/17/14  POCT INR  Result Value Ref Range   INR 2.6         Assessment & Plan:   1. Hx pulmonary embolism    Follow up to repeat INR in 4 weeks  Mary-Margaret Daphine DeutscherMartin, FNP

## 2014-08-17 NOTE — Patient Instructions (Addendum)
Anticoagulation Dose Instructions as of 08/17/2014      Glynis SmilesSun Mon Tue Wed Thu Fri Sat   New Dose 5 mg 5 mg 5 mg 2.5 mg 5 mg 5 mg 2.5 mg    Description        continu 1 tablet daily except 1/2 tablet wed and sat     Follow up in 4 weks

## 2014-08-23 ENCOUNTER — Other Ambulatory Visit: Payer: Self-pay | Admitting: Nurse Practitioner

## 2014-08-23 DIAGNOSIS — F329 Major depressive disorder, single episode, unspecified: Secondary | ICD-10-CM

## 2014-08-23 DIAGNOSIS — F419 Anxiety disorder, unspecified: Principal | ICD-10-CM

## 2014-08-23 MED ORDER — TRAMADOL HCL 50 MG PO TABS: 50.0000 mg | ORAL_TABLET | Freq: Four times a day (QID) | ORAL | Status: DC | PRN

## 2014-08-23 MED ORDER — DIAZEPAM 10 MG PO TABS: ORAL_TABLET | ORAL | Status: DC

## 2014-09-19 ENCOUNTER — Other Ambulatory Visit: Payer: Self-pay | Admitting: Nurse Practitioner

## 2014-09-19 ENCOUNTER — Ambulatory Visit (INDEPENDENT_AMBULATORY_CARE_PROVIDER_SITE_OTHER): Payer: Medicare Other | Admitting: Nurse Practitioner

## 2014-09-19 ENCOUNTER — Encounter: Payer: Self-pay | Admitting: Nurse Practitioner

## 2014-09-19 DIAGNOSIS — Z86711 Personal history of pulmonary embolism: Secondary | ICD-10-CM | POA: Diagnosis not present

## 2014-09-19 DIAGNOSIS — I2510 Atherosclerotic heart disease of native coronary artery without angina pectoris: Secondary | ICD-10-CM

## 2014-09-19 LAB — POCT INR: INR: 1.6

## 2014-09-19 MED ORDER — TIZANIDINE HCL 4 MG PO TABS: 4.0000 mg | ORAL_TABLET | Freq: Four times a day (QID) | ORAL | Status: DC | PRN

## 2014-09-19 NOTE — Progress Notes (Signed)
Pt aware change of medication is at pharmacy.

## 2014-09-19 NOTE — Progress Notes (Signed)
INR check only

## 2014-09-19 NOTE — Patient Instructions (Signed)
Anticoagulation Dose Instructions as of 09/19/2014      Monica SmilesSun Mon Tue Wed Thu Fri Sat   New Dose 5 mg 5 mg 5 mg 5 mg 5 mg 5 mg 2.5 mg    Description        Take 1 tablet daily and recheck friday     Recheck friday

## 2014-09-21 ENCOUNTER — Telehealth: Payer: Self-pay | Admitting: Nurse Practitioner

## 2014-09-21 ENCOUNTER — Other Ambulatory Visit: Payer: Self-pay | Admitting: *Deleted

## 2014-09-21 MED ORDER — TRAMADOL HCL 50 MG PO TABS: 50.0000 mg | ORAL_TABLET | Freq: Four times a day (QID) | ORAL | Status: DC | PRN

## 2014-09-21 NOTE — Telephone Encounter (Signed)
Last filled 08/23/14, last seen 07/29/14. Rx will print

## 2014-09-21 NOTE — Telephone Encounter (Signed)
Ultram rx ready for pick up  

## 2014-09-23 ENCOUNTER — Other Ambulatory Visit: Payer: Medicare Other

## 2014-09-23 ENCOUNTER — Ambulatory Visit (INDEPENDENT_AMBULATORY_CARE_PROVIDER_SITE_OTHER): Payer: Medicare Other | Admitting: Pharmacist

## 2014-09-23 DIAGNOSIS — I2782 Chronic pulmonary embolism: Secondary | ICD-10-CM

## 2014-09-23 DIAGNOSIS — Z86711 Personal history of pulmonary embolism: Secondary | ICD-10-CM

## 2014-09-23 LAB — POCT INR: INR: 1.6

## 2014-09-23 NOTE — Patient Instructions (Signed)
Anticoagulation Dose Instructions as of 09/23/2014      Monica SmilesSun Mon Tue Wed Thu Fri Sat   New Dose 5 mg 2.5 mg 5 mg 5 mg 5 mg 5 mg 5 mg    Description        Take 1 and 1/2 tablets today, then increase dose to 1/2 tablet on mondays only and 1 tablet all other days.     INR was 1.6 today

## 2014-10-03 ENCOUNTER — Other Ambulatory Visit: Payer: Self-pay | Admitting: Nurse Practitioner

## 2014-10-03 ENCOUNTER — Ambulatory Visit: Payer: Medicare Other | Admitting: Family

## 2014-10-05 ENCOUNTER — Encounter: Payer: Self-pay | Admitting: Nurse Practitioner

## 2014-10-05 ENCOUNTER — Other Ambulatory Visit: Payer: Self-pay | Admitting: Nurse Practitioner

## 2014-10-05 ENCOUNTER — Ambulatory Visit (INDEPENDENT_AMBULATORY_CARE_PROVIDER_SITE_OTHER): Payer: Medicare Other | Admitting: Nurse Practitioner

## 2014-10-05 DIAGNOSIS — I2510 Atherosclerotic heart disease of native coronary artery without angina pectoris: Secondary | ICD-10-CM

## 2014-10-05 DIAGNOSIS — Z86711 Personal history of pulmonary embolism: Secondary | ICD-10-CM

## 2014-10-05 DIAGNOSIS — E1142 Type 2 diabetes mellitus with diabetic polyneuropathy: Secondary | ICD-10-CM

## 2014-10-05 LAB — POCT INR: INR: 2.3

## 2014-10-05 MED ORDER — GABAPENTIN 300 MG PO CAPS: ORAL_CAPSULE | ORAL | Status: DC

## 2014-10-05 NOTE — Progress Notes (Signed)
   Subjective:    Patient ID: [name], female    DOB: 06/28/1945, 69 y.o.   MRN: [id number]  HPI    Review of Systems     Objective:   Physical Exam        Assessment & Plan:  Patient here today for INR only

## 2014-10-05 NOTE — Patient Instructions (Signed)
Anticoagulation Dose Instructions as of 10/05/2014      Monica SmilesSun Mon Tue Wed Thu Fri Sat   New Dose 5 mg 2.5 mg 5 mg 5 mg 5 mg 5 mg 5 mg    Description        Continue 1 tablet daily except 1/2 tablet on  mondays

## 2014-10-13 ENCOUNTER — Other Ambulatory Visit: Payer: Self-pay | Admitting: *Deleted

## 2014-10-13 ENCOUNTER — Ambulatory Visit: Payer: Medicare Other | Admitting: Nurse Practitioner

## 2014-10-13 MED ORDER — TRAMADOL HCL 50 MG PO TABS: 50.0000 mg | ORAL_TABLET | Freq: Four times a day (QID) | ORAL | Status: DC | PRN

## 2014-10-13 NOTE — Telephone Encounter (Signed)
Last filled for #30 on 09/23/14. Last seen 10/05/14

## 2014-10-13 NOTE — Telephone Encounter (Signed)
Ultram rx ready for pick up  

## 2014-10-14 ENCOUNTER — Telehealth: Payer: Self-pay | Admitting: Nurse Practitioner

## 2014-10-14 DIAGNOSIS — F329 Major depressive disorder, single episode, unspecified: Secondary | ICD-10-CM

## 2014-10-14 DIAGNOSIS — F419 Anxiety disorder, unspecified: Principal | ICD-10-CM

## 2014-10-14 DIAGNOSIS — F32A Depression, unspecified: Secondary | ICD-10-CM

## 2014-10-14 MED ORDER — DIAZEPAM 10 MG PO TABS: ORAL_TABLET | ORAL | Status: DC

## 2014-10-14 NOTE — Telephone Encounter (Signed)
Called valium into pharmacy and script is ready for pickup at front desk.

## 2014-10-14 NOTE — Telephone Encounter (Signed)
Please call in valium 10 mg 1 po prn #[phone]  with 1 refills Ultram rx is ready for pick up

## 2014-10-28 ENCOUNTER — Ambulatory Visit: Payer: Medicare Other | Admitting: Nurse Practitioner

## 2014-10-28 DIAGNOSIS — Z0289 Encounter for other administrative examinations: Secondary | ICD-10-CM

## 2014-11-01 ENCOUNTER — Other Ambulatory Visit: Payer: Self-pay | Admitting: Nurse Practitioner

## 2014-11-03 ENCOUNTER — Ambulatory Visit (INDEPENDENT_AMBULATORY_CARE_PROVIDER_SITE_OTHER): Payer: Medicare Other | Admitting: Pharmacist

## 2014-11-03 ENCOUNTER — Telehealth: Payer: Self-pay | Admitting: Nurse Practitioner

## 2014-11-03 DIAGNOSIS — Z86711 Personal history of pulmonary embolism: Secondary | ICD-10-CM

## 2014-11-03 LAB — POCT INR: INR: 3.1

## 2014-11-03 MED ORDER — GLUCOSE BLOOD VI STRP: ORAL_STRIP | Status: DC

## 2014-11-03 MED ORDER — ACCU-CHEK SOFT TOUCH LANCETS MISC: Status: DC

## 2014-11-03 NOTE — Telephone Encounter (Signed)
rx sent to pharmacy for testing supplies

## 2014-11-03 NOTE — Patient Instructions (Signed)
Anticoagulation Dose Instructions as of 11/03/2014      Monica SmilesSun Mon Tue Wed Thu Fri Sat   New Dose 5 mg 2.5 mg 5 mg 5 mg 5 mg 5 mg 5 mg    Description        No warfarin today - Thursday, May 26th , then start the following warfarin 5mg  dose 1 tablet daily except 1/2 tablet on  mondays     INR was 3.1 today

## 2014-11-04 ENCOUNTER — Other Ambulatory Visit: Payer: Self-pay | Admitting: Nurse Practitioner

## 2014-11-09 ENCOUNTER — Ambulatory Visit (INDEPENDENT_AMBULATORY_CARE_PROVIDER_SITE_OTHER): Payer: Medicare Other | Admitting: Nurse Practitioner

## 2014-11-09 ENCOUNTER — Encounter: Payer: Self-pay | Admitting: Nurse Practitioner

## 2014-11-09 VITALS — BP 148/67 | HR 66 | Temp 97.9°F | Ht 62.0 in | Wt 186.0 lb

## 2014-11-09 DIAGNOSIS — E669 Obesity, unspecified: Secondary | ICD-10-CM | POA: Diagnosis not present

## 2014-11-09 DIAGNOSIS — F329 Major depressive disorder, single episode, unspecified: Secondary | ICD-10-CM

## 2014-11-09 DIAGNOSIS — E1142 Type 2 diabetes mellitus with diabetic polyneuropathy: Secondary | ICD-10-CM | POA: Diagnosis not present

## 2014-11-09 DIAGNOSIS — I272 Pulmonary hypertension, unspecified: Secondary | ICD-10-CM

## 2014-11-09 DIAGNOSIS — E785 Hyperlipidemia, unspecified: Secondary | ICD-10-CM

## 2014-11-09 DIAGNOSIS — E876 Hypokalemia: Secondary | ICD-10-CM

## 2014-11-09 DIAGNOSIS — I10 Essential (primary) hypertension: Secondary | ICD-10-CM | POA: Diagnosis not present

## 2014-11-09 DIAGNOSIS — I2510 Atherosclerotic heart disease of native coronary artery without angina pectoris: Secondary | ICD-10-CM

## 2014-11-09 DIAGNOSIS — Z86711 Personal history of pulmonary embolism: Secondary | ICD-10-CM

## 2014-11-09 DIAGNOSIS — K219 Gastro-esophageal reflux disease without esophagitis: Secondary | ICD-10-CM | POA: Diagnosis not present

## 2014-11-09 DIAGNOSIS — I270 Primary pulmonary hypertension: Secondary | ICD-10-CM | POA: Diagnosis not present

## 2014-11-09 DIAGNOSIS — D508 Other iron deficiency anemias: Secondary | ICD-10-CM | POA: Diagnosis not present

## 2014-11-09 DIAGNOSIS — E039 Hypothyroidism, unspecified: Secondary | ICD-10-CM | POA: Diagnosis not present

## 2014-11-09 DIAGNOSIS — R072 Precordial pain: Secondary | ICD-10-CM | POA: Diagnosis not present

## 2014-11-09 DIAGNOSIS — Z23 Encounter for immunization: Secondary | ICD-10-CM | POA: Diagnosis not present

## 2014-11-09 DIAGNOSIS — F419 Anxiety disorder, unspecified: Secondary | ICD-10-CM

## 2014-11-09 DIAGNOSIS — F418 Other specified anxiety disorders: Secondary | ICD-10-CM

## 2014-11-09 LAB — POCT GLYCOSYLATED HEMOGLOBIN (HGB A1C): HEMOGLOBIN A1C: 7.6

## 2014-11-09 MED ORDER — METOPROLOL SUCCINATE ER 25 MG PO TB24: ORAL_TABLET | ORAL | Status: DC

## 2014-11-09 MED ORDER — FUROSEMIDE 40 MG PO TABS: 20.0000 mg | ORAL_TABLET | Freq: Every day | ORAL | Status: DC

## 2014-11-09 MED ORDER — AMITRIPTYLINE HCL 25 MG PO TABS: 25.0000 mg | ORAL_TABLET | Freq: Every day | ORAL | Status: DC

## 2014-11-09 MED ORDER — FENOFIBRIC ACID 135 MG PO CPDR: 1.0000 | DELAYED_RELEASE_CAPSULE | Freq: Every day | ORAL | Status: DC

## 2014-11-09 MED ORDER — POTASSIUM CHLORIDE CRYS ER 20 MEQ PO TBCR: 20.0000 meq | EXTENDED_RELEASE_TABLET | Freq: Every morning | ORAL | Status: DC

## 2014-11-09 MED ORDER — GABAPENTIN 300 MG PO CAPS: ORAL_CAPSULE | ORAL | Status: DC

## 2014-11-09 MED ORDER — LOSARTAN POTASSIUM 25 MG PO TABS: 25.0000 mg | ORAL_TABLET | Freq: Every day | ORAL | Status: DC

## 2014-11-09 MED ORDER — GLIMEPIRIDE 1 MG PO TABS
ORAL_TABLET | ORAL | Status: DC
Start: 2014-11-09 — End: 2014-12-19

## 2014-11-09 MED ORDER — LEVOTHYROXINE SODIUM 88 MCG PO TABS: ORAL_TABLET | ORAL | Status: DC

## 2014-11-09 MED ORDER — WARFARIN SODIUM 5 MG PO TABS: 2.5000 mg | ORAL_TABLET | Freq: Every evening | ORAL | Status: DC

## 2014-11-09 MED ORDER — SIMVASTATIN 40 MG PO TABS: 40.0000 mg | ORAL_TABLET | Freq: Every day | ORAL | Status: DC

## 2014-11-09 MED ORDER — PANTOPRAZOLE SODIUM 40 MG PO TBEC: 40.0000 mg | DELAYED_RELEASE_TABLET | Freq: Every day | ORAL | Status: DC

## 2014-11-09 NOTE — Patient Instructions (Signed)

## 2014-11-09 NOTE — Addendum Note (Signed)
Addended by: Cleda DaubUCKER, Damian Buckles G on: 11/09/2014 12:10 PM   Modules accepted: Orders

## 2014-11-09 NOTE — Progress Notes (Signed)
Subjective:    Patient ID: [name], female    DOB: 03-15-1946, 69 y.o.   MRN: [id number]   Patient here today for follow up of chronic medical problems.   Hypertension This is a chronic problem. The current episode started more than 1 year ago. The problem is unchanged. The problem is controlled. Pertinent negatives include no chest pain, headaches, neck pain, palpitations, peripheral edema or shortness of breath. Risk factors for coronary artery disease include dyslipidemia, diabetes mellitus, obesity, post-menopausal state and sedentary lifestyle. Past treatments include beta blockers, diuretics and angiotensin blockers. The current treatment provides moderate improvement. Compliance problems include diet and exercise.  Hypertensive end-organ damage includes CAD/MI.  Hyperlipidemia This is a chronic problem. The current episode started more than 1 year ago. The problem is controlled. Recent lipid tests were reviewed and are variable. Pertinent negatives include no chest pain or shortness of breath. Current antihyperlipidemic treatment includes statins and fibric acid derivatives. The current treatment provides moderate improvement of lipids. Compliance problems include adherence to diet and adherence to exercise.  Risk factors for coronary artery disease include diabetes mellitus, dyslipidemia, hypertension, post-menopausal and a sedentary lifestyle.  Diabetes She presents for her follow-up diabetic visit. She has type 2 diabetes mellitus. No MedicAlert identification noted. Her disease course has been stable. Pertinent negatives for hypoglycemia include no headaches. Pertinent negatives for diabetes include no chest pain. There are no hypoglycemic complications. There are no diabetic complications. Current diabetic treatment includes oral agent (monotherapy). She is compliant with treatment all of the time. Her weight is stable. When asked about meal planning, she reported none. She has not  had a previous visit with a dietitian. (Very seldom checks her blood sugars) An ACE inhibitor/angiotensin II receptor blocker is being taken. She does not see a podiatrist.Eye exam is not current.  diabetic neuropathy neurotin works okay- still has a slight burning sensation in feet COPD duoneb as needed- says she is doing well. GERD protonix works well to keep symptoms under control hypokalemia kdur daily- no c/o lower extremity cramping hypothyroidism Synthroid daily-  No complaints GAD Valium 2-3 x a day- also takes amitriptyline which helps keep her calm Hx Pulmonary emboli On coumadin but has not had INR checked in over a year  Review of Systems  Constitutional: Negative.   HENT: Negative.   Respiratory: Negative for shortness of breath.   Cardiovascular: Negative for chest pain and palpitations.  Genitourinary: Negative.   Musculoskeletal: Negative for neck pain.  Neurological: Negative for headaches.  Psychiatric/Behavioral: Negative.   All other systems reviewed and are negative.      Objective:   Physical Exam  Constitutional: She is oriented to person, place, and time. She appears well-developed and well-nourished.  HENT:  Nose: Nose normal.  Mouth/Throat: Oropharynx is clear and moist.  Eyes: EOM are normal.  Neck: Trachea normal, normal range of motion and full passive range of motion without pain. Neck supple. No JVD present. Carotid bruit is not present. No thyromegaly present.  Cardiovascular: Normal rate, regular rhythm, normal heart sounds and intact distal pulses.  Exam reveals no gallop and no friction rub.   No murmur heard. Pulmonary/Chest: Effort normal and breath sounds normal.  Abdominal: Soft. Bowel sounds are normal. She exhibits no distension and no mass. There is no tenderness.  Musculoskeletal: Normal range of motion.  Lymphadenopathy:    She has no cervical adenopathy.  Neurological: She is alert and oriented to person, place, and time. She  has normal  reflexes.  Skin: Skin is warm and dry.  Psychiatric: She has a normal mood and affect. Her behavior is normal. Judgment and thought content normal.    BP 148/67 mmHg  Pulse 66  Temp(Src) 97.9 F (36.6 C) (Oral)  Ht 5' 2"  (1.575 m)  Wt 186 lb (84.369 kg)  BMI 34.01 kg/m2    Results for orders placed or performed in visit on 11/09/14  POCT glycosylated hemoglobin (Hb A1C)  Result Value Ref Range   Hemoglobin A1C 7.6           Assessment & Plan:  1. Pulmonary HTN on CTA this admission Do not add salt to diet  2. Essential hypertension, benign Do not add salt to diet - CMP14+EGFR - losartan (COZAAR) 25 MG tablet; Take 1 tablet (25 mg total) by mouth daily.  Dispense: 30 tablet; Refill: 5 - potassium chloride SA (K-DUR,KLOR-CON) 20 MEQ tablet; Take 1 tablet (20 mEq total) by mouth every morning.  Dispense: 30 tablet; Refill: 5  3. Gastroesophageal reflux disease without esophagitis Avoid spicy foods Do not eat 2 hours prior to bedtime  - pantoprazole (PROTONIX) 40 MG tablet; Take 1 tablet (40 mg total) by mouth daily.  Dispense: 30 tablet; Refill: 5  4. Type 2 diabetes mellitus with diabetic polyneuropathy  watch carbs in diet or will need to change meds Discussed carbs - POCT glycosylated hemoglobin (Hb A1C) - glimepiride (AMARYL) 1 MG tablet; TAKE 1 TABLET BY MOUTH BEFORE BREAKFAST  Dispense: 30 tablet; Refill: 5  5. Obesity (BMI 30-39.9) Discussed diet and exercise for person with BMI >25 Will recheck weight in 3-6 months   6. Hypokalemia - potassium chloride SA (K-DUR,KLOR-CON) 20 MEQ tablet; Take 1 tablet (20 mEq total) by mouth every morning.  Dispense: 30 tablet; Refill: 5  7. Hyperlipidemia with target LDL less than 100 Low fat diet - NMR, lipoprofile - Choline Fenofibrate (FENOFIBRIC ACID) 135 MG CPDR; Take 1 capsule by mouth daily.  Dispense: 30 capsule; Refill: 5 - simvastatin (ZOCOR) 40 MG tablet; Take 1 tablet (40 mg total) by mouth at  bedtime.  Dispense: 30 tablet; Refill: 5  8. Anxiety and depression, ( ? early dementia) Stress management - amitriptyline (ELAVIL) 25 MG tablet; Take 1 tablet (25 mg total) by mouth at bedtime.  Dispense: 30 tablet; Refill: 5  9. Other iron deficiency anemias  10. CHEST PAIN, PRECORDIAL,   11. Hypothyroidism, unspecified hypothyroidism type - Thyroid Panel With TSH - levothyroxine (SYNTHROID, LEVOTHROID) 88 MCG tablet; TAKE (1) TABLET ONCE DAILY IN THE MORNING BEFORE BREAKFAST.  Dispense: 30 tablet; Refill: 5  12. Diabetic polyneuropathy associated with type 2 diabetes mellitus Do not go barefooted - gabapentin (NEURONTIN) 300 MG capsule; 1 po BID  Dispense: 60 capsule; Refill: 5  13. Coronary artery disease involving native coronary artery of native heart without angina pectoris Keep follow up appointment with cardiology - metoprolol succinate (TOPROL-XL) 25 MG 24 hr tablet; TAKE (1) TABLET BY MOUTH ONCE DAILY.  Dispense: 30 tablet; Refill: 5  14. Hx pulmonary embolism - warfarin (COUMADIN) 5 MG tablet; Take 0.5 tablets (2.5 mg total) by mouth every evening.  Dispense: 5 tablet; Refill: 5    Labs pending Health maintenance reviewed Diet and exercise encouraged Continue all meds Follow up  In 3 months   [place], FNP

## 2014-11-10 LAB — NMR, LIPOPROFILE
CHOLESTEROL: 144 mg/dL (ref 100–199)
HDL CHOLESTEROL BY NMR: 64 mg/dL (ref >39)
HDL Particle Number: 37.4 umol/L (ref >=30.5)
LDL Particle Number: 811 nmol/L (ref <1000)
LDL Size: 20.8 nm (ref >20.5)
LDL-C: 57 mg/dL (ref 0–99)
LP-IR Score: 60 — ABNORMAL HIGH (ref <=45)
Small LDL Particle Number: 458 nmol/L (ref <=527)
Triglycerides by NMR: 113 mg/dL (ref 0–149)

## 2014-11-10 LAB — CMP14+EGFR
ALT: 34 IU/L — AB (ref 0–32)
AST: 29 IU/L (ref 0–40)
Albumin/Globulin Ratio: 1.4 (ref 1.1–2.5)
Albumin: 3.7 g/dL (ref 3.6–4.8)
Alkaline Phosphatase: 48 IU/L (ref 39–117)
BUN/Creatinine Ratio: 18 (ref 11–26)
BUN: 17 mg/dL (ref 8–27)
Bilirubin Total: 0.2 mg/dL (ref 0.0–1.2)
CALCIUM: 9.1 mg/dL (ref 8.7–10.3)
CO2: 25 mmol/L (ref 18–29)
CREATININE: 0.94 mg/dL (ref 0.57–1.00)
Chloride: 101 mmol/L (ref 97–108)
GFR calc Af Amer: 73 mL/min/{1.73_m2} (ref >59)
GFR, EST NON AFRICAN AMERICAN: 63 mL/min/{1.73_m2} (ref >59)
Globulin, Total: 2.6 g/dL (ref 1.5–4.5)
Glucose: 184 mg/dL — ABNORMAL HIGH (ref 65–99)
Potassium: 4.8 mmol/L (ref 3.5–5.2)
Sodium: 143 mmol/L (ref 134–144)
TOTAL PROTEIN: 6.3 g/dL (ref 6.0–8.5)

## 2014-11-10 LAB — THYROID PANEL WITH TSH
Free Thyroxine Index: 3.2 (ref 1.2–4.9)
T3 Uptake Ratio: 28 % (ref 24–39)
T4, Total: 11.5 ug/dL (ref 4.5–12.0)
TSH: 0.778 u[IU]/mL (ref 0.450–4.500)

## 2014-11-22 ENCOUNTER — Ambulatory Visit (INDEPENDENT_AMBULATORY_CARE_PROVIDER_SITE_OTHER): Payer: Medicare Other | Admitting: Pharmacist Clinician (PhC)/ Clinical Pharmacy Specialist

## 2014-11-22 ENCOUNTER — Other Ambulatory Visit: Payer: Self-pay | Admitting: Nurse Practitioner

## 2014-11-22 DIAGNOSIS — Z86711 Personal history of pulmonary embolism: Secondary | ICD-10-CM | POA: Diagnosis not present

## 2014-11-22 DIAGNOSIS — D508 Other iron deficiency anemias: Secondary | ICD-10-CM | POA: Diagnosis not present

## 2014-11-22 LAB — POCT INR: INR: 8

## 2014-11-22 LAB — POCT HEMOGLOBIN: Hemoglobin: 11.8 g/dL — AB (ref 12.2–16.2)

## 2014-11-23 ENCOUNTER — Other Ambulatory Visit: Payer: Self-pay

## 2014-11-23 ENCOUNTER — Ambulatory Visit (INDEPENDENT_AMBULATORY_CARE_PROVIDER_SITE_OTHER): Payer: Medicare Other | Admitting: Pharmacist

## 2014-11-23 DIAGNOSIS — Z86711 Personal history of pulmonary embolism: Secondary | ICD-10-CM | POA: Diagnosis not present

## 2014-11-23 LAB — PROTIME-INR
INR: >10 (ref 0.8–1.2)
Prothrombin Time: >120 s — ABNORMAL HIGH (ref 9.1–12.0)

## 2014-11-23 LAB — POCT INR: INR: 3.4

## 2014-11-23 NOTE — Telephone Encounter (Signed)
Patient aware that rx is ready to be picked up.  

## 2014-11-23 NOTE — Patient Instructions (Addendum)
Anticoagulation Dose Instructions as of 11/23/2014      [name] Tue Wed Thu Fri Sat   New Dose 2.5 mg 2.5 mg 2.5 mg Hold 2.5 mg 2.5 mg 2.5 mg    Description        No warfarin today - Wednesday, June 16th, then restart at new dose of warfarin 5mg  - take 1/2 tablet daily     INR was 3.4 today

## 2014-11-23 NOTE — Telephone Encounter (Signed)
RX ready for pick up 

## 2014-11-23 NOTE — Telephone Encounter (Signed)
Last seen 11/09/14 [name] If approved print

## 2014-11-30 ENCOUNTER — Ambulatory Visit (INDEPENDENT_AMBULATORY_CARE_PROVIDER_SITE_OTHER): Payer: Medicare Other | Admitting: Pharmacist

## 2014-11-30 ENCOUNTER — Encounter: Payer: Self-pay | Admitting: Pharmacist

## 2014-11-30 ENCOUNTER — Ambulatory Visit (INDEPENDENT_AMBULATORY_CARE_PROVIDER_SITE_OTHER): Payer: Medicare Other

## 2014-11-30 VITALS — BP 140/72 | HR 64 | Ht 62.0 in | Wt 186.2 lb

## 2014-11-30 DIAGNOSIS — Z86711 Personal history of pulmonary embolism: Secondary | ICD-10-CM

## 2014-11-30 DIAGNOSIS — D649 Anemia, unspecified: Secondary | ICD-10-CM | POA: Diagnosis not present

## 2014-11-30 DIAGNOSIS — E1142 Type 2 diabetes mellitus with diabetic polyneuropathy: Secondary | ICD-10-CM

## 2014-11-30 DIAGNOSIS — Z0000 Encounter for general adult medical examination without abnormal findings: Secondary | ICD-10-CM | POA: Diagnosis not present

## 2014-11-30 DIAGNOSIS — Z780 Asymptomatic menopausal state: Secondary | ICD-10-CM

## 2014-11-30 DIAGNOSIS — M8580 Other specified disorders of bone density and structure, unspecified site: Secondary | ICD-10-CM

## 2014-11-30 DIAGNOSIS — H9193 Unspecified hearing loss, bilateral: Secondary | ICD-10-CM

## 2014-11-30 LAB — POCT CBC
Granulocyte percent: 59.5 %G (ref 37–80)
HEMATOCRIT: 40 % (ref 37.7–47.9)
HEMOGLOBIN: 12.5 g/dL (ref 12.2–16.2)
Lymph, poc: 2.1 (ref 0.6–3.4)
MCH, POC: 27.8 pg (ref 27–31.2)
MCHC: 31.2 g/dL — AB (ref 31.8–35.4)
MCV: 89.1 fL (ref 80–97)
MPV: 9.2 fL (ref 0–99.8)
POC Granulocyte: 4.1 (ref 2–6.9)
POC LYMPH PERCENT: 30.5 %L (ref 10–50)
Platelet Count, POC: 317 10*3/uL (ref 142–424)
RBC: 4.49 M/uL (ref 4.04–5.48)
RDW, POC: 14.9 %
WBC: 6.9 10*3/uL (ref 4.6–10.2)

## 2014-11-30 LAB — POCT INR: INR: 2.8

## 2014-11-30 NOTE — Patient Instructions (Addendum)
[name] , Thank you for taking time to come for your Medicare Wellness Visit. I appreciate your ongoing commitment to your health goals. Please review the following plan we discussed and let me know if I can assist you in the future.    Increase non-starchy vegetables - carrots, green bean, squash, zucchini, tomatoes, onions, peppers, spinach and other green leafy vegetables, cabbage, lettuce, cucumbers, asparagus, okra (not fried), eggplant limit sugar and processed foods (cakes, cookies, ice cream, crackers and chips) Increase fresh fruit but limit serving sizes 1/2 cup or about the size of tennis or baseball limit red meat to no more than 1-2 times per week (serving size about the size of your palm) Choose whole grains / lean proteins - whole wheat bread, quinoa, whole grain rice (1/2 cup), fish, chicken, [place]   This is a list of the screening recommended for you and due dates:  Health Maintenance  Topic Date Due  . DEXA scan (bone density measurement)  Done today  . Tetanus Vaccine  01/27/2015*  . Mammogram  Appointment made for Wednesday, July 27th at 1:30pm at [facility] at [facility]  . Eye exam for diabetics  Appointment made at [email or web address] ([facility] in [place]) for July 8th 2016 at 1:20pm  . Shingles Vaccine  02/09/2015*  . Flu Shot  01/09/2015  . Urine Protein Check  04/22/2015  . Hemoglobin A1C  05/11/2015  . Complete foot exam   11/09/2015  . Pneumonia vaccines (2 of 2 - PPSV23) 07/18/2016  . Colon Cancer Screening  08/13/2021  *Topic was postponed. The date shown is not the original due date.    Anticoagulation Dose Instructions as of 11/30/2014      [name] Tue Wed Thu Fri Sat   New Dose 2.5 mg 2.5 mg 2.5 mg 2.5 mg 2.5 mg 2.5 mg 2.5 mg    Description        Continue new dose of  warfarin  - take 1/2 tablet daily     INR was 2.8 today  Calcium & Vitamin D: The Facts  Why is calcium and vitamin D consumption  important? Calcium: . Most Americans do not consume adequate amounts of calcium! Calcium is required for proper muscle function, nerve communication, bone support, and many other functions in the body.  . The body uses bones as a source of calcium. Bones 'remodel' themselves continuously - the body constantly breaks bone down to release calcium and rebuilds bones by replacing calcium in the bone later.  . As we get older, the rate of bone breakdown occurs faster than bone rebuilding which could lead to osteopenia, osteoporosis, and possible fractures.   Vitamin D: . People naturally make vitamin D in the body when sunlight hits the skin and triggers a process that leads to vitamin D production. This natural vitamin D production requires about 10-15 minutes of sun exposure on the hands, arms, and face at least 2-3 times per week. However, due to decreased sun exposure and the use of sunscreen, most people will need to get additional vitamin D from foods or supplements. Your doctor can measure your body's vitamin D level through a simple blood test to determine your daily vitamin D needs.  . Vitamin D is used to help the body absorb calcium, maintain bone health, help the immune system, and reduce inflammation. It also plays a role in muscle performance, balance and risk of falling.  . Vitamin D deficiency can lead to osteomalacia  or softening of the bones, bone pain, and muscle weakness.   The recommended daily allowance of Calcium and Vitamin D varies for different age groups. Age group Calcium (mg) Vitamin D (IU)  Females and Males: Age 35-50 1000 mg 600 IU  Females: Age 69- 75 1200 mg 600 IU  Males: Age 30-70 1000 mg 600 IU  Females and Males: Age 37+ 1200 mg 800 IU  Pregnant/lactating Females age 81-50 1000 mg 600 IU   How much Calcium do you get in your diet? Calcium Intake # of servings per day  Total calcium (mg)  Skim milk, 2% milk (1 cup) _________ x 300 mg   Yogurt (1 small container)  _________ x 200 mg   Cheese (1oz) _________ x 200 mg   Cottage Cheese (1 cup)             ________ x 150 mg   Almond milk (1 cup) _________ x 450 mg   Fortified Orange Juice (1 cup) _________ x 300 mg   Broccoli or spinach ( 1 cup) _________ x 100 mg   Salmon (3 oz) _________ x 150 mg    Almonds (1/4 cup) _______ x 90 mg      How do we get Calcium and Vitamin D in our diet? Calcium: . Obtaining calcium from the diet is the most preferred way to reach the recommended daily goal. If this goal is not reached through diet, calcium supplements are available.  . Calcium is found in many foods including: dairy products, dark leafy vegetables (like broccoli, kale, and spinach), fish, and fortified products like juices and cereals.  . The food label will have a %DV (percent daily value) listed showing the amount of calcium per serving. To determine the total mg per serving, simply replace the % with zero (0).  For example, Almond Breeze almond milk contains 45% DV of calcium or  per 1 cup.  . You can increase the amount of calcium in your diet by using more calcium products in your daily meals. Use yogurt and fruit to make smoothies or use yogurt to top baked potatoes or make whipped potatoes. Sprinkle low fat cheese onto salads or into egg white omelets. You can even add non-fat dry milk powder (  calcium per 1/3 cup) to hot cereals, meat loaf, soups, or potatoes.  . Calcium supplements come in many forms including tablets, chewables, and gummies. Be sure to read the label to determine the correct number of tablets per serving and whether or not to take the supplement with food.  . Calcium carbonate products (Oscal, Caltrate, and Viactiv) are generally better absorbed when taken with food while calcium citrate products like Citracal can be taken with or without food.  . The body can only absorb about 600 mg of calcium at one time. It is recommended to take calcium supplements in small amounts  several times per day.  However, taking it all at once is better than not taking it at all. . Increasing your intake of calcium is essential for bone health, but may also lead to some side effects like constipation, increased gas, bloating or abdominal cramping. To help reduce these side effects, start with 1 tablet per day and slowly increase your intake of the supplement to the recommended doses. It is also recommended that you drink plenty of water each day. Vitamin D: . Very few foods naturally contain vitamin D. However, it is found in saltwater fish (like tuna, salmon and mackerel), beef liver,  egg yolks, cheese and vitamin D fortified foods (like yogurt, cereals, orange juice and milk) . The amount of vitamin D in each food or product is listed as %DV on the product label. To determine the total amount of vitamin D per serving, drop the % sign and multiply the number by 4. For example, 1 cup of Almond Breeze almond milk contains 25% DV vitamin D or 100 IU per serving (25 x 4 =100). . Vitamin D is also found in multivitamins and supplements and may be listed as ergocalciferol (vitamin D2) or cholecalciferol (vitamin D3). Each of these forms of vitamin D are equivalent and the daily recommended intake will vary based on your age and the vitamin D levels in your body. Follow your doctor's recommendation for vitamin D intake.

## 2014-11-30 NOTE — Progress Notes (Signed)
Patient ID: [name], female   DOB: [birth year], 69 y.o.   MRN: [id number]    Subjective:   [name] is a 69 y.o. female who presents for an Initial Medicare Annual Wellness Visit and check INR today.   Patient complains of increase fatigue over the last few days which she feels is related to heat.  She denies SOB and HBG readings have been 100 to 190.  Last TSH was WNL  Current Medications (verified) Outpatient Encounter Prescriptions as of 11/30/2014  Medication Sig  . amitriptyline (ELAVIL) 25 MG tablet Take 1 tablet (25 mg total) by mouth at bedtime.  . Cholecalciferol (VITAMIN D3) 5000 UNITS CAPS Take 5,000 Units by mouth daily.  . Choline Fenofibrate (FENOFIBRIC ACID) 135 MG CPDR Take 1 capsule by mouth daily.  . diazepam (VALIUM) 10 MG tablet TAKE 1 TABLET BY MOUTH DAILY AS NEEDED.  . fish oil-omega-3 fatty acids 1000 MG capsule Take 1 g by mouth every morning.   . furosemide (LASIX) 40 MG tablet Take 0.5 tablets (20 mg total) by mouth daily.  Marland Kitchen gabapentin (NEURONTIN) 300 MG capsule 1 po BID  . glimepiride (AMARYL) 1 MG tablet TAKE 1 TABLET BY MOUTH BEFORE BREAKFAST  . glucose blood (ACCU-CHEK AVIVA) test strip Use to check blood glucose up to bid. Dx: E11.9 type 2 diabetes  . ipratropium-albuterol (DUONEB) 0.5-2.5 (3) MG/3ML SOLN Take 3 mLs by nebulization every 4 (four) hours as needed. For shortness of breath  . Lancets (ACCU-CHEK SOFT TOUCH) lancets Use to check BG up to bid. Dx: E11.9 - type 2 DM  . levothyroxine (SYNTHROID, LEVOTHROID) 88 MCG tablet TAKE (1) TABLET ONCE DAILY IN THE MORNING BEFORE BREAKFAST.  Marland Kitchen losartan (COZAAR) 25 MG tablet Take 1 tablet (25 mg total) by mouth daily.  . metoprolol succinate (TOPROL-XL) 25 MG 24 hr tablet TAKE (1) TABLET BY MOUTH ONCE DAILY.  Marland Kitchen OVER THE COUNTER MEDICATION Take 1-3 tablets by mouth daily. Weight Loss Formula with Apple Cider Vinegar, Kelp, Lecithin, and B-6  . pantoprazole (PROTONIX) 40 MG tablet Take 1 tablet (40  mg total) by mouth daily.  . potassium chloride SA (K-DUR,KLOR-CON) 20 MEQ tablet Take 1 tablet (20 mEq total) by mouth every morning.  Marland Kitchen PROAIR HFA 108 (90 BASE) MCG/ACT inhaler INHALE 2 PUFFS INTO THE LUNGS EVERY 6 HOURS AS NEEDED FOR SHORTNESS OF BREATH.  . simvastatin (ZOCOR) 40 MG tablet Take 1 tablet (40 mg total) by mouth at bedtime.  Marland Kitchen tiZANidine (ZANAFLEX) 4 MG tablet Take 1 tablet (4 mg total) by mouth every 6 (six) hours as needed for muscle spasms.  . traMADol (ULTRAM) 50 MG tablet TAKE 1 TABLET BY MOUTH EVERY 6 HOURS AS NEEDED  . warfarin (COUMADIN) 5 MG tablet Take 0.5 tablets (2.5 mg total) by mouth every evening.  . meclizine (ANTIVERT) 50 MG tablet Take 50 mg by mouth 3 (three) times daily as needed for dizziness.   No facility-administered encounter medications on file as of 11/30/2014.    Allergies (verified) Procaine hcl; Codeine; Latex; and Penicillins   History: Past Medical History  Diagnosis Date  . Hypertension   . Diabetes mellitus   . Arthritis   . Blood transfusion 2007    History of  . PE (pulmonary embolism) February 2007; March 2013    Bilateral PEs in 07; massive PE March 2013 with moderate elevation in PA pressures.  . Anemia   . COPD (chronic obstructive pulmonary disease) with emphysema   . Depression   .  Anxiety   . Gastroesophageal reflux disease   . Osteoporosis   . DDD (degenerative disc disease)   . Uterine prolapse   . Asthma   . History of CHF (congestive heart failure) 2007    2013: Normal EF by echo x2 with no significant diastolic dysfunction noted either  . Heart murmur     No significant valvular lesions on echo  . Hyperlipidemia   . Hypothyroidism (acquired)     On Levothyroxine  . Seizures     once  . Headache(784.0)   . Heart palpitations   . Chronic kidney disease     lt kidney limited fxn   Past Surgical History  Procedure Laterality Date  . Hiatel hernia repair    . Tubal ligation    . Cardiac catheterization   March 2013    No evidence of coronary disease; also no significant aortic or iliac or renal artery disease.  . Left heart catheterization with coronary angiogram N/A 08/16/2011    Procedure: LEFT HEART CATHETERIZATION WITH CORONARY ANGIOGRAM;  Surgeon: Marykay Lex, MD;  Location: [facility] CATH LAB;  Service: Cardiovascular;  Laterality: N/A;   Family History  Problem Relation Age of Onset  . Heart disease Mother   . Stroke Mother   . Prostate cancer Father   . Cancer Father     bone  . Liver cancer Brother   . Alzheimer's disease      family history  . Cancer Daughter     breast  . Cancer Paternal Uncle     unsure of what kind  . COPD Sister   . Heart disease Sister   . Goiter Sister   . Early death Brother 8  . Cancer Brother     leukemia   Social History   Occupational History  . Not on file.   Social History Main Topics  . Smoking status: Former Smoker -- 2.00 packs/day    Types: Cigarettes    Quit date: 03/23/2001  . Smokeless tobacco: Never Used  . Alcohol Use: No  . Drug Use: No  . Sexual Activity: No    Do you feel safe at home?  Yes  Dietary issues and exercise activities: Current Exercise Habits:: Exercise is limited by, Limited by:: cardiac condition(s)  Current Dietary habits:  Patient is not following any specific dietary recommendations or restrictions  Objective:    Today's Vitals   11/30/14 1429  BP: 140/72  Pulse: 64  Height:  (1.575 m)  Weight: 186 lb 4 oz (84.482 kg)  PainSc: 0-No pain   Body mass index is 34.06 kg/(m^2).   INR was 2.8 today in office  DEXA Results Date of Test T-Score for AP Spine L1-L4 T-Score for Total Left Hip T-Score for Total Right Hip T-Score for Neck of Left Hip T-Score for Neck of Right Hip  11/30/2014 -1.0 -0.8 -0.8 -1.8 -2.0  07/01/2012 -0.8 -1.0 -0.8 -1.4 -1.7  04/12/2009 -1.0 -0.8 -0.5 -1.3 -1.4          FRAX 10 year estimate: Total FX risk:  10% (consider medication if >/= 20%) Hip FX risk:  1.6%  (consider medication if >/= 3%)   Activities of Daily Living In your present state of health, do you have any difficulty performing the following activities: 11/30/2014 04/21/2014  Hearing? Y N  Vision? N N  Difficulty concentrating or making decisions? N N  Walking or climbing stairs? Y N  Dressing or bathing? N N  Doing errands, shopping?  N N  Preparing Food and eating ? N -  Using the Toilet? N -  In the past six months, have you accidently leaked urine? N -  Do you have problems with loss of bowel control? N -  Managing your Medications? N -  Managing your Finances? N -  Housekeeping or managing your Housekeeping? N -    Are there smokers in your home (other than you)? No   Depression Screen PHQ 2/9 Scores 11/30/2014 11/09/2014 07/29/2014 04/21/2014  PHQ - 2 Score 1 0 0 0    Fall Risk Fall Risk  11/30/2014 11/09/2014 07/29/2014 04/21/2014  Falls in the past year? No No No No    Cognitive Function: MMSE - Mini Mental State Exam 11/30/2014  Orientation to time 4  Orientation to Place 4  Registration 3  Attention/ Calculation 3  Recall 3  Language- name 2 objects 2  Language- repeat 1  Language- follow 3 step command 3  Language- read & follow direction 1  Write a sentence 1  Copy design 1  Total score 26    Immunizations and Health Maintenance Immunization History  Administered Date(s) Administered  . Influenza Split 07/19/2011  . Influenza,inj,Quad PF,36+ Mos 03/22/2014  . Pneumococcal Conjugate-13 11/09/2014  . Pneumococcal Polysaccharide-23 07/19/2011   There are no preventive care reminders to display for this patient.  Patient Care Team: Bennie Pierini, FNP as PCP - General (Nurse Practitioner)  Indicate any recent Medical Services you may have received from other than [facility] providers in the past year (date may be approximate).    Assessment:    Annual Wellness Visit  Decreased hearing in both ears.  Decreased height (1") and post menopausal female   Type 2 DM Anticoagulation therapy - INR at goal   Screening Tests Health Maintenance  Topic Date Due  . DEXA SCAN  01/27/2015 (Originally 02/07/2013)  . TETANUS/TDAP  01/27/2015 (Originally 02/08/1967)  . MAMMOGRAM  02/09/2015 (Originally 02/07/1998)  . OPHTHALMOLOGY EXAM  02/09/2015 (Originally 02/07/1958)  . ZOSTAVAX  02/09/2015 (Originally 02/08/2008)  . INFLUENZA VACCINE  01/09/2015  . URINE MICROALBUMIN  04/22/2015  . HEMOGLOBIN A1C  05/11/2015  . FOOT EXAM  11/09/2015  . PNA vac Low Risk Adult (2 of 2 - PPSV23) 07/18/2016  . COLONOSCOPY  08/13/2021        Plan:   During the course of the visit Kensley was educated and counseled about the following appropriate screening and preventive services:   Vaccines to include Pneumoccal, Influenza, Hepatitis B, Td, Zostavax - patient is due Zostavax and possibly Tdap but she just had prevnar 13 about 3 weeks ago.  Will check cost of vaccines when she RTC and administer is she wants in 3 weeks.  Colorectal cancer screening - colonoscopy UTD; due to have FOBT checked - hemocult cards given in office today  Cardiovascular disease screening - UTD.  BP is good today.  LDL is at goal.  Diabetes - A1c is slightly elevated at 7.8%.  Discussed limiting high CHO foods and disucssed serving size recommendations.  Urine microalbumin checked today.  Bone Denisty / Osteoporosis Screening - Done today  Mammogram - appt scheduled for July 27yh at 1:30pm with [facility] mobile unit  Glaucoma screening / Diabetic Eye Exam - called and scheduled exam for patient for July 8th at 1:20pm with [email or web address] in [place]  Nutrition counseling - see above - discussed limiting serving sizes of high CHO containing foods.  Increase non starchy vegetables. Also discussed ways to increase calcium  intake from diet.  Recommended  calcium daily from both diet and supplementation  Referral sent for audiologist evaluation  Advanced Directives - UTD  Referral to  audiologist for decreased hearing  Anticoagulation Dose Instructions as of 11/30/2014      [name] Tue Wed Thu Fri Sat   New Dose 2.5 mg 2.5 mg 2.5 mg 2.5 mg 2.5 mg 2.5 mg 2.5 mg    Description        Continue new dose of  warfarin  - take 1/2 tablet daily     Orders Placed This Encounter  Procedures  . DG Bone Density    Order Specific Question:  Reason for Exam (SYMPTOM  OR DIAGNOSIS REQUIRED)    Answer:  post menopausal female    Order Specific Question:  Preferred imaging location?    Answer:  Internal  . Microalbumin / creatinine urine ratio  . Vit D  25 hydroxy (rtn osteoporosis monitoring)  . Ambulatory referral to Audiology    Referral Priority:  Routine    Referral Type:  Audiology Exam    Referral Reason:  Specialty Services Required    Referred to Provider:  Landry Mellow, AUD    Requested Specialty:  Audiology    Number of Visits Requested:  1  . POCT CBC    Patient Instructions (the written plan) were given to the patient.   Henrene Pastor, [facility]   11/30/2014

## 2014-12-01 LAB — MICROALBUMIN / CREATININE URINE RATIO
Creatinine, Urine: 52.6 mg/dL
Microalbumin, Urine: <3 ug/mL

## 2014-12-01 LAB — VITAMIN D 25 HYDROXY (VIT D DEFICIENCY, FRACTURES): Vit D, 25-Hydroxy: 26.5 ng/mL — ABNORMAL LOW (ref 30.0–100.0)

## 2014-12-16 DIAGNOSIS — H5201 Hypermetropia, right eye: Secondary | ICD-10-CM | POA: Diagnosis not present

## 2014-12-16 DIAGNOSIS — E119 Type 2 diabetes mellitus without complications: Secondary | ICD-10-CM | POA: Diagnosis not present

## 2014-12-16 DIAGNOSIS — H5212 Myopia, left eye: Secondary | ICD-10-CM | POA: Diagnosis not present

## 2014-12-16 DIAGNOSIS — H1045 Other chronic allergic conjunctivitis: Secondary | ICD-10-CM | POA: Diagnosis not present

## 2014-12-16 LAB — HM DIABETES EYE EXAM

## 2014-12-19 ENCOUNTER — Ambulatory Visit (INDEPENDENT_AMBULATORY_CARE_PROVIDER_SITE_OTHER): Payer: Medicare Other | Admitting: Pharmacist

## 2014-12-19 DIAGNOSIS — F329 Major depressive disorder, single episode, unspecified: Secondary | ICD-10-CM

## 2014-12-19 DIAGNOSIS — IMO0002 Reserved for concepts with insufficient information to code with codable children: Secondary | ICD-10-CM

## 2014-12-19 DIAGNOSIS — Z86711 Personal history of pulmonary embolism: Secondary | ICD-10-CM

## 2014-12-19 DIAGNOSIS — F419 Anxiety disorder, unspecified: Secondary | ICD-10-CM

## 2014-12-19 DIAGNOSIS — E1140 Type 2 diabetes mellitus with diabetic neuropathy, unspecified: Secondary | ICD-10-CM | POA: Diagnosis not present

## 2014-12-19 DIAGNOSIS — E1165 Type 2 diabetes mellitus with hyperglycemia: Secondary | ICD-10-CM | POA: Diagnosis not present

## 2014-12-19 LAB — POCT INR: INR: 2.9

## 2014-12-19 MED ORDER — GLIMEPIRIDE 2 MG PO TABS: 2.0000 mg | ORAL_TABLET | Freq: Every day | ORAL | Status: DC

## 2014-12-19 MED ORDER — DIAZEPAM 10 MG PO TABS: ORAL_TABLET | ORAL | Status: DC

## 2014-12-19 NOTE — Progress Notes (Signed)
Subjective:     Indication: PE Bleeding signs/symptoms: None Thromboembolic signs/symptoms: None  Missed Coumadin doses: None Medication changes: no Dietary changes: yes - more hamburgers but not green leafy begetables Bacterial/viral infection: no Other concerns: yes - patient reports BG has been elevated and bring in glucometer.  7 day avg = 174 30 day avg = 201 96, 106, 101, 257, 206, 120, 169, 191, 158, 206, 148, 187, 143, 217, 243  The following portions of the patient's history were reviewed and updated as appropriate: allergies, current medications, past family history, past medical history, past social history, past surgical history and problem list.   Objective:    INR Today: 2.9  Current dose: warfarin 5mg  take 1/2 tablet daily  Assessment:    Therapeutic INR for goal of 2-3  uncontrolled type 2 DM  Plan:    1. New dose: no change   2. Next INR: 1 month   3.  Increase glimepiride to 2 mg 1 tablet qam  Henrene Pastorammy Fintan Grater, PharmD, CPP

## 2014-12-19 NOTE — Patient Instructions (Signed)
Anticoagulation Dose Instructions as of 12/19/2014      Glynis SmilesSun Mon Tue Wed Thu Fri Sat   New Dose 2.5 mg 2.5 mg 2.5 mg 2.5 mg 2.5 mg 2.5 mg 2.5 mg    Description        Continue new dose of  warfarin 5mg  - take 1/2 tablet daily     INR was 2.9 today

## 2014-12-20 ENCOUNTER — Encounter: Payer: Self-pay | Admitting: *Deleted

## 2015-01-04 DIAGNOSIS — Z1231 Encounter for screening mammogram for malignant neoplasm of breast: Secondary | ICD-10-CM | POA: Diagnosis not present

## 2015-01-25 ENCOUNTER — Ambulatory Visit (INDEPENDENT_AMBULATORY_CARE_PROVIDER_SITE_OTHER): Payer: Medicare Other | Admitting: Pharmacist

## 2015-01-25 DIAGNOSIS — Z86711 Personal history of pulmonary embolism: Secondary | ICD-10-CM

## 2015-01-25 LAB — POCT INR: INR: 2.9

## 2015-01-25 MED ORDER — PERMETHRIN 5 % EX CREA: 1.0000 "application " | TOPICAL_CREAM | Freq: Once | CUTANEOUS | Status: DC

## 2015-01-25 NOTE — Progress Notes (Signed)
Patient also states that her grown granddaughter that lives with her was diagnosed last night in ER with scabies.   Discussed with Bennie Pierini, NP her PCP and she OK calling in Elimite cream to treat patient.   Patient to apply Elimite cream tonight from neck down to soles of feet.  Leave on for 8 hours and then rinse off thoroughly.   Patient advised to wash all linens, clothing in hot water and dry thoroughly.  Anything that cannot be washed should be placed in a plastic bag for 48 to 72 hours.  Rx called to [facility] at patient's request.

## 2015-02-09 ENCOUNTER — Other Ambulatory Visit: Payer: Self-pay | Admitting: *Deleted

## 2015-02-09 ENCOUNTER — Encounter: Payer: Self-pay | Admitting: Nurse Practitioner

## 2015-02-09 ENCOUNTER — Ambulatory Visit (INDEPENDENT_AMBULATORY_CARE_PROVIDER_SITE_OTHER): Payer: Medicare Other | Admitting: Nurse Practitioner

## 2015-02-09 VITALS — BP 116/70 | HR 77 | Temp 97.5°F | Ht 62.0 in | Wt 186.0 lb

## 2015-02-09 DIAGNOSIS — K219 Gastro-esophageal reflux disease without esophagitis: Secondary | ICD-10-CM

## 2015-02-09 DIAGNOSIS — IMO0002 Reserved for concepts with insufficient information to code with codable children: Secondary | ICD-10-CM

## 2015-02-09 DIAGNOSIS — F418 Other specified anxiety disorders: Secondary | ICD-10-CM

## 2015-02-09 DIAGNOSIS — F329 Major depressive disorder, single episode, unspecified: Secondary | ICD-10-CM

## 2015-02-09 DIAGNOSIS — E1140 Type 2 diabetes mellitus with diabetic neuropathy, unspecified: Secondary | ICD-10-CM

## 2015-02-09 DIAGNOSIS — F419 Anxiety disorder, unspecified: Secondary | ICD-10-CM

## 2015-02-09 DIAGNOSIS — I2510 Atherosclerotic heart disease of native coronary artery without angina pectoris: Secondary | ICD-10-CM | POA: Diagnosis not present

## 2015-02-09 DIAGNOSIS — E1142 Type 2 diabetes mellitus with diabetic polyneuropathy: Secondary | ICD-10-CM

## 2015-02-09 DIAGNOSIS — Z86711 Personal history of pulmonary embolism: Secondary | ICD-10-CM | POA: Diagnosis not present

## 2015-02-09 DIAGNOSIS — Z7901 Long term (current) use of anticoagulants: Secondary | ICD-10-CM

## 2015-02-09 DIAGNOSIS — E785 Hyperlipidemia, unspecified: Secondary | ICD-10-CM

## 2015-02-09 DIAGNOSIS — E876 Hypokalemia: Secondary | ICD-10-CM

## 2015-02-09 DIAGNOSIS — D508 Other iron deficiency anemias: Secondary | ICD-10-CM

## 2015-02-09 DIAGNOSIS — E1165 Type 2 diabetes mellitus with hyperglycemia: Secondary | ICD-10-CM

## 2015-02-09 DIAGNOSIS — B372 Candidiasis of skin and nail: Secondary | ICD-10-CM | POA: Diagnosis not present

## 2015-02-09 DIAGNOSIS — I10 Essential (primary) hypertension: Secondary | ICD-10-CM | POA: Diagnosis not present

## 2015-02-09 LAB — POCT INR: INR: 2.3

## 2015-02-09 LAB — POCT GLYCOSYLATED HEMOGLOBIN (HGB A1C): Hemoglobin A1C: 8.1

## 2015-02-09 MED ORDER — NYSTATIN 100000 UNIT/GM EX CREA: 1.0000 "application " | TOPICAL_CREAM | Freq: Two times a day (BID) | CUTANEOUS | Status: DC

## 2015-02-09 MED ORDER — METFORMIN HCL 500 MG PO TABS: 500.0000 mg | ORAL_TABLET | Freq: Two times a day (BID) | ORAL | Status: DC

## 2015-02-09 MED ORDER — GLIMEPIRIDE 2 MG PO TABS: 2.0000 mg | ORAL_TABLET | Freq: Every day | ORAL | Status: DC

## 2015-02-09 MED ORDER — DIAZEPAM 10 MG PO TABS: ORAL_TABLET | ORAL | Status: DC

## 2015-02-09 NOTE — Patient Instructions (Signed)
Cutaneous Candidiasis Cutaneous candidiasis is a condition in which there is an overgrowth of yeast (candida) on the skin. Yeast normally live on the skin, but in small enough numbers not to cause any symptoms. In certain cases, increased growth of the yeast may cause an actual yeast infection. This kind of infection usually occurs in areas of the skin that are constantly warm and moist, such as the armpits or the groin. Yeast is the most common cause of diaper rash in babies and in people who cannot control their bowel movements (incontinence). CAUSES  The fungus that most often causes cutaneous candidiasis is Candida albicans. Conditions that can increase the risk of getting a yeast infection of the skin include:  Obesity.  Pregnancy.  Diabetes.  Taking antibiotic medicine.  Taking birth control pills.  Taking steroid medicines.  Thyroid disease.  An iron or zinc deficiency.  Problems with the immune system. SYMPTOMS   Red, swollen area of the skin.  Bumps on the skin.  Itchiness. DIAGNOSIS  The diagnosis of cutaneous candidiasis is usually based on its appearance. Light scrapings of the skin may also be taken and viewed under a microscope to identify the presence of yeast. TREATMENT  Antifungal creams may be applied to the infected skin. In severe cases, oral medicines may be needed.  HOME CARE INSTRUCTIONS   Keep your skin clean and dry.  Maintain a healthy weight.  If you have diabetes, keep your blood sugar under control. SEEK IMMEDIATE MEDICAL CARE IF:  Your rash continues to spread despite treatment.  You have a fever, chills, or abdominal pain. Document Released: 02/12/2011 Document Revised: 08/19/2011 Document Reviewed: 02/12/2011 ExitCare Patient Information 2015 [employer]. This information is not intended to replace advice given to you by your health care provider. Make sure you discuss any questions you have with your health care provider.  

## 2015-02-09 NOTE — Progress Notes (Signed)
Subjective:    Patient ID: [name], female    DOB: [birth year], 69 y.o.   MRN: [id number]   Patient here today for follow up of chronic medical problems. Her only complaint is yeast rash under bil breasts   Hypertension This is a chronic problem. The current episode started more than 1 year ago. The problem is unchanged. The problem is controlled. Pertinent negatives include no chest pain, headaches, neck pain, palpitations, peripheral edema or shortness of breath. Risk factors for coronary artery disease include dyslipidemia, diabetes mellitus, obesity, post-menopausal state and sedentary lifestyle. Past treatments include beta blockers, diuretics and angiotensin blockers. The current treatment provides moderate improvement. Compliance problems include diet and exercise.  Hypertensive end-organ damage includes CAD/MI.  Hyperlipidemia This is a chronic problem. The current episode started more than 1 year ago. The problem is controlled. Recent lipid tests were reviewed and are variable. Pertinent negatives include no chest pain or shortness of breath. Current antihyperlipidemic treatment includes statins and fibric acid derivatives. The current treatment provides moderate improvement of lipids. Compliance problems include adherence to diet and adherence to exercise.  Risk factors for coronary artery disease include diabetes mellitus, dyslipidemia, hypertension, post-menopausal and a sedentary lifestyle.  Diabetes She presents for her follow-up diabetic visit. She has type 2 diabetes mellitus. No MedicAlert identification noted. Her disease course has been stable. Pertinent negatives for hypoglycemia include no headaches. Pertinent negatives for diabetes include no chest pain. There are no hypoglycemic complications. There are no diabetic complications. Current diabetic treatment includes oral agent (monotherapy). She is compliant with treatment all of the time. Her weight is stable. When asked  about meal planning, she reported none. She has not had a previous visit with a dietitian. (Very seldom checks her blood sugars) An ACE inhibitor/angiotensin II receptor blocker is being taken. She does not see a podiatrist.Eye exam is not current.  diabetic neuropathy neurotin works okay- still has a slight burning sensation in feet COPD duoneb as needed- says she is doing well. GERD protonix works well to keep symptoms under control hypokalemia kdur daily- no c/o lower extremity cramping hypothyroidism Synthroid daily-  No complaints GAD Valium 2-3 x a day- also takes amitriptyline which helps keep her calm Hx Pulmonary emboli On coumadin but has not had INR checked in over a year  Review of Systems  Constitutional: Negative.   HENT: Negative.   Respiratory: Negative for shortness of breath.   Cardiovascular: Negative for chest pain and palpitations.  Genitourinary: Negative.   Musculoskeletal: Negative for neck pain.  Neurological: Negative for headaches.  Psychiatric/Behavioral: Negative.   All other systems reviewed and are negative.      Objective:   Physical Exam  Constitutional: She is oriented to person, place, and time. She appears well-developed and well-nourished.  HENT:  Nose: Nose normal.  Mouth/Throat: Oropharynx is clear and moist.  Eyes: EOM are normal.  Neck: Trachea normal, normal range of motion and full passive range of motion without pain. Neck supple. No JVD present. Carotid bruit is not present. No thyromegaly present.  Cardiovascular: Normal rate, regular rhythm, normal heart sounds and intact distal pulses.  Exam reveals no gallop and no friction rub.   No murmur heard. Pulmonary/Chest: Effort normal and breath sounds normal.  Abdominal: Soft. Bowel sounds are normal. She exhibits no distension and no mass. There is no tenderness.  Musculoskeletal: Normal range of motion.  Lymphadenopathy:    She has no cervical adenopathy.  Neurological: She is  alert and  oriented to person, place, and time. She has normal reflexes.  Skin: Skin is warm and dry.  Erythematous moist rash bil mammory fold  Psychiatric: She has a normal mood and affect. Her behavior is normal. Judgment and thought content normal.   BP 116/70 mmHg  Pulse 77  Temp(Src) 97.5 F (36.4 C) (Oral)  Ht 5' 2"  (1.575 m)  Wt 186 lb (84.369 kg)  BMI 34.01 kg/m2   Results for orders placed or performed in visit on 02/09/15  POCT INR  Result Value Ref Range   INR 2.3   POCT glycosylated hemoglobin (Hb A1C)  Result Value Ref Range   Hemoglobin A1C 8.1           Assessment & Plan:  1. Hx pulmonary embolism - POCT INR  2. DM type 2, uncontrolled, with neuropathy Stricter carb counting Added metformin to meds- do  Not know why she was not already on this - POCT glycosylated hemoglobin (Hb A1C) - glimepiride (AMARYL) 2 MG tablet; Take 1 tablet (2 mg total) by mouth daily before breakfast.  Dispense: 30 tablet; Refill: 5  3. Hyperlipidemia with target LDL less than 100 Low fta diet - Lipid panel  4. Essential hypertension, benign Do not add salt to diet - CMP14+EGFR  5. Gastroesophageal reflux disease without esophagitis Avoid spicy foods Do not eat 2 hours prior to bedtime   6. Type 2 diabetes mellitus with diabetic polyneuropathy  7. Anxiety and depression, ( ? early dementia) Stress management - diazepam (VALIUM) 10 MG tablet; TAKE 1 TABLET BY MOUTH DAILY AS NEEDED.  Dispense: 32 tablet; Refill: 1  8. Other iron deficiency anemias  9. Hypokalemia  10. Chronic anticoagulation, pt will need lifelong anticoagulation  11. Cutaneous candidiasis - nystatin cream (MYCOSTATIN); Apply 1 application topically 2 (two) times daily.  Dispense: 30 g; Refill: 0    Labs pending Health maintenance reviewed Diet and exercise encouraged Continue all meds Follow up  In 3 months    [place], FNP

## 2015-02-10 LAB — CMP14+EGFR
ALT: 25 IU/L (ref 0–32)
AST: 21 IU/L (ref 0–40)
Albumin/Globulin Ratio: 1.4 (ref 1.1–2.5)
Albumin: 4 g/dL (ref 3.6–4.8)
Alkaline Phosphatase: 47 IU/L (ref 39–117)
BILIRUBIN TOTAL: 0.3 mg/dL (ref 0.0–1.2)
BUN/Creatinine Ratio: 25 (ref 11–26)
BUN: 26 mg/dL (ref 8–27)
CALCIUM: 9.6 mg/dL (ref 8.7–10.3)
CHLORIDE: 100 mmol/L (ref 97–108)
CO2: 26 mmol/L (ref 18–29)
Creatinine, Ser: 1.03 mg/dL — ABNORMAL HIGH (ref 0.57–1.00)
GFR calc non Af Amer: 56 mL/min/{1.73_m2} — ABNORMAL LOW (ref >59)
GFR, EST AFRICAN AMERICAN: 65 mL/min/{1.73_m2} (ref >59)
GLUCOSE: 209 mg/dL — AB (ref 65–99)
Globulin, Total: 2.9 g/dL (ref 1.5–4.5)
Potassium: 4.5 mmol/L (ref 3.5–5.2)
Sodium: 141 mmol/L (ref 134–144)
Total Protein: 6.9 g/dL (ref 6.0–8.5)

## 2015-02-10 LAB — LIPID PANEL
Chol/HDL Ratio: 2.6 ratio units (ref 0.0–4.4)
Cholesterol, Total: 156 mg/dL (ref 100–199)
HDL: 61 mg/dL (ref >39)
LDL Calculated: 69 mg/dL (ref 0–99)
TRIGLYCERIDES: 130 mg/dL (ref 0–149)
VLDL CHOLESTEROL CAL: 26 mg/dL (ref 5–40)

## 2015-02-19 ENCOUNTER — Emergency Department (HOSPITAL_COMMUNITY)
Admission: EM | Admit: 2015-02-19 | Discharge: 2015-02-19 | Disposition: A | Discharge status: Home / Self Care | Payer: Medicare Other | Attending: Emergency Medicine | Admitting: Emergency Medicine

## 2015-02-19 ENCOUNTER — Encounter (HOSPITAL_COMMUNITY): Payer: Self-pay | Admitting: *Deleted

## 2015-02-19 DIAGNOSIS — I129 Hypertensive chronic kidney disease with stage 1 through stage 4 chronic kidney disease, or unspecified chronic kidney disease: Secondary | ICD-10-CM | POA: Diagnosis not present

## 2015-02-19 DIAGNOSIS — K219 Gastro-esophageal reflux disease without esophagitis: Secondary | ICD-10-CM | POA: Diagnosis not present

## 2015-02-19 DIAGNOSIS — Z862 Personal history of diseases of the blood and blood-forming organs and certain disorders involving the immune mechanism: Secondary | ICD-10-CM | POA: Insufficient documentation

## 2015-02-19 DIAGNOSIS — E039 Hypothyroidism, unspecified: Secondary | ICD-10-CM | POA: Diagnosis not present

## 2015-02-19 DIAGNOSIS — Z79899 Other long term (current) drug therapy: Secondary | ICD-10-CM | POA: Diagnosis not present

## 2015-02-19 DIAGNOSIS — K920 Hematemesis: Secondary | ICD-10-CM | POA: Insufficient documentation

## 2015-02-19 DIAGNOSIS — Z87891 Personal history of nicotine dependence: Secondary | ICD-10-CM | POA: Insufficient documentation

## 2015-02-19 DIAGNOSIS — Z91040 Latex allergy status: Secondary | ICD-10-CM | POA: Diagnosis not present

## 2015-02-19 DIAGNOSIS — R110 Nausea: Secondary | ICD-10-CM | POA: Diagnosis not present

## 2015-02-19 DIAGNOSIS — E119 Type 2 diabetes mellitus without complications: Secondary | ICD-10-CM | POA: Diagnosis not present

## 2015-02-19 DIAGNOSIS — G40909 Epilepsy, unspecified, not intractable, without status epilepticus: Secondary | ICD-10-CM | POA: Diagnosis not present

## 2015-02-19 DIAGNOSIS — F419 Anxiety disorder, unspecified: Secondary | ICD-10-CM | POA: Insufficient documentation

## 2015-02-19 DIAGNOSIS — R011 Cardiac murmur, unspecified: Secondary | ICD-10-CM | POA: Diagnosis not present

## 2015-02-19 DIAGNOSIS — Z880 Allergy status to penicillin: Secondary | ICD-10-CM | POA: Insufficient documentation

## 2015-02-19 DIAGNOSIS — J449 Chronic obstructive pulmonary disease, unspecified: Secondary | ICD-10-CM | POA: Insufficient documentation

## 2015-02-19 DIAGNOSIS — Z9889 Other specified postprocedural states: Secondary | ICD-10-CM | POA: Diagnosis not present

## 2015-02-19 DIAGNOSIS — I509 Heart failure, unspecified: Secondary | ICD-10-CM | POA: Insufficient documentation

## 2015-02-19 DIAGNOSIS — Z86711 Personal history of pulmonary embolism: Secondary | ICD-10-CM | POA: Diagnosis not present

## 2015-02-19 DIAGNOSIS — Z87448 Personal history of other diseases of urinary system: Secondary | ICD-10-CM | POA: Insufficient documentation

## 2015-02-19 DIAGNOSIS — N189 Chronic kidney disease, unspecified: Secondary | ICD-10-CM | POA: Insufficient documentation

## 2015-02-19 DIAGNOSIS — Z7901 Long term (current) use of anticoagulants: Secondary | ICD-10-CM | POA: Insufficient documentation

## 2015-02-19 DIAGNOSIS — E785 Hyperlipidemia, unspecified: Secondary | ICD-10-CM | POA: Diagnosis not present

## 2015-02-19 DIAGNOSIS — F329 Major depressive disorder, single episode, unspecified: Secondary | ICD-10-CM | POA: Insufficient documentation

## 2015-02-19 DIAGNOSIS — Z8739 Personal history of other diseases of the musculoskeletal system and connective tissue: Secondary | ICD-10-CM | POA: Insufficient documentation

## 2015-02-19 LAB — CBC WITH DIFFERENTIAL/PLATELET
BASOS ABS: 0.1 10*3/uL (ref 0.0–0.1)
BASOS PCT: 1 % (ref 0–1)
Eosinophils Absolute: 0.1 10*3/uL (ref 0.0–0.7)
Eosinophils Relative: 2 % (ref 0–5)
HEMATOCRIT: 37 % (ref 36.0–46.0)
HEMOGLOBIN: 12.2 g/dL (ref 12.0–15.0)
Lymphocytes Relative: 35 % (ref 12–46)
Lymphs Abs: 2.3 10*3/uL (ref 0.7–4.0)
MCH: 30.6 pg (ref 26.0–34.0)
MCHC: 33 g/dL (ref 30.0–36.0)
MCV: 92.7 fL (ref 78.0–100.0)
MONOS PCT: 8 % (ref 3–12)
Monocytes Absolute: 0.5 10*3/uL (ref 0.1–1.0)
NEUTROS ABS: 3.4 10*3/uL (ref 1.7–7.7)
NEUTROS PCT: 54 % (ref 43–77)
Platelets: 212 10*3/uL (ref 150–400)
RBC: 3.99 MIL/uL (ref 3.87–5.11)
RDW: 13.6 % (ref 11.5–15.5)
WBC: 6.4 10*3/uL (ref 4.0–10.5)

## 2015-02-19 LAB — COMPREHENSIVE METABOLIC PANEL
ALT: 33 U/L (ref 14–54)
AST: 32 U/L (ref 15–41)
Albumin: 3.6 g/dL (ref 3.5–5.0)
Alkaline Phosphatase: 44 U/L (ref 38–126)
Anion gap: 7 (ref 5–15)
BILIRUBIN TOTAL: 0.3 mg/dL (ref 0.3–1.2)
BUN: 24 mg/dL — AB (ref 6–20)
CO2: 34 mmol/L — ABNORMAL HIGH (ref 22–32)
CREATININE: 1.19 mg/dL — AB (ref 0.44–1.00)
Calcium: 8.7 mg/dL — ABNORMAL LOW (ref 8.9–10.3)
Chloride: 100 mmol/L — ABNORMAL LOW (ref 101–111)
GFR calc Af Amer: 54 mL/min — ABNORMAL LOW (ref >60)
GFR, EST NON AFRICAN AMERICAN: 46 mL/min — AB (ref >60)
Glucose, Bld: 267 mg/dL — ABNORMAL HIGH (ref 65–99)
Potassium: 3.6 mmol/L (ref 3.5–5.1)
Sodium: 141 mmol/L (ref 135–145)
TOTAL PROTEIN: 6.9 g/dL (ref 6.5–8.1)

## 2015-02-19 LAB — URINALYSIS, ROUTINE W REFLEX MICROSCOPIC
BILIRUBIN URINE: NEGATIVE
HGB URINE DIPSTICK: NEGATIVE
Ketones, ur: NEGATIVE mg/dL
Leukocytes, UA: NEGATIVE
Nitrite: NEGATIVE
Protein, ur: NEGATIVE mg/dL
SPECIFIC GRAVITY, URINE: 1.02 (ref 1.005–1.030)
UROBILINOGEN UA: 1 mg/dL (ref 0.0–1.0)
pH: 6 (ref 5.0–8.0)

## 2015-02-19 LAB — URINE MICROSCOPIC-ADD ON

## 2015-02-19 LAB — PROTIME-INR
INR: 1.33 (ref 0.00–1.49)
PROTHROMBIN TIME: 16.6 s — AB (ref 11.6–15.2)

## 2015-02-19 MED ORDER — PANTOPRAZOLE SODIUM 20 MG PO TBEC: 20.0000 mg | DELAYED_RELEASE_TABLET | Freq: Every day | ORAL | Status: DC

## 2015-02-19 MED ORDER — PANTOPRAZOLE SODIUM 40 MG IV SOLR
40.0000 mg | Freq: Once | INTRAVENOUS | Status: AC
  Administered 2015-02-19: 40 mg via INTRAVENOUS
  Filled 2015-02-19: qty 40

## 2015-02-19 MED ORDER — SODIUM CHLORIDE 0.9 % IV BOLUS (SEPSIS)
1000.0000 mL | Freq: Once | INTRAVENOUS | Status: AC
  Administered 2015-02-19: 1000 mL via INTRAVENOUS

## 2015-02-19 NOTE — Discharge Instructions (Signed)
Do not take your coumadin tonight.  Follow up with your md this week.  Return if bleed starts again

## 2015-02-19 NOTE — ED Notes (Signed)
Pt reports one episode of bright red vomit this morning. Pt also c/o lower abdominal and right side pain and she thinks she may have "ripped" something holding her "chunky" grandson.

## 2015-02-19 NOTE — ED Notes (Signed)
Patient verbalizes understanding of discharge instructions, prescriptions, and home care and follow up care. Patient ambulatory out of department at this time with family.

## 2015-02-19 NOTE — ED Provider Notes (Signed)
CSN: [id number]     Arrival date & time 02/19/15  1837 History   First MD Initiated Contact with Patient 02/19/15 1947     Chief Complaint  Patient presents with  . Hematemesis     (Consider location/radiation/quality/duration/timing/severity/associated sxs/prior Treatment) Patient is a 69 y.o. female presenting with vomiting. The history is provided by the patient (Patient states that she vomited blood earlier this morning one time. Prior crit blood. She has felt fine the rest of the day.).  Emesis Severity:  Moderate Timing:  Rare Quality:  Bright red blood Progression:  Unchanged Chronicity:  New Recent urination:  Normal Context: not post-tussive   Associated symptoms: no abdominal pain, no diarrhea and no headaches     Past Medical History  Diagnosis Date  . Hypertension   . Diabetes mellitus   . Arthritis   . Blood transfusion 2007    History of  . PE (pulmonary embolism) February 2007; March 2013    Bilateral PEs in 07; massive PE March 2013 with moderate elevation in PA pressures.  . Anemia   . COPD (chronic obstructive pulmonary disease) with emphysema   . Depression   . Anxiety   . Gastroesophageal reflux disease   . Osteoporosis   . DDD (degenerative disc disease)   . Uterine prolapse   . Asthma   . History of CHF (congestive heart failure) 2007    2013: Normal EF by echo x2 with no significant diastolic dysfunction noted either  . Heart murmur     No significant valvular lesions on echo  . Hyperlipidemia   . Hypothyroidism (acquired)     On Levothyroxine  . Seizures     once  . Headache(784.0)   . Heart palpitations   . Chronic kidney disease     lt kidney limited fxn   Past Surgical History  Procedure Laterality Date  . Hiatel hernia repair    . Tubal ligation    . Cardiac catheterization  March 2013    No evidence of coronary disease; also no significant aortic or iliac or renal artery disease.  . Left heart catheterization with coronary  angiogram N/A 08/16/2011    Procedure: LEFT HEART CATHETERIZATION WITH CORONARY ANGIOGRAM;  Surgeon: Marykay Lex, MD;  Location: [facility] CATH LAB;  Service: Cardiovascular;  Laterality: N/A;   Family History  Problem Relation Age of Onset  . Heart disease Mother   . Stroke Mother   . Prostate cancer Father   . Cancer Father     bone  . Liver cancer Brother   . Alzheimer's disease      family history  . Cancer Daughter     breast  . Cancer Paternal Uncle     unsure of what kind  . COPD Sister   . Heart disease Sister   . Goiter Sister   . Early death Brother 8  . Cancer Brother     leukemia   Social History  Substance Use Topics  . Smoking status: Former Smoker -- 2.00 packs/day    Types: Cigarettes    Quit date: 03/23/2001  . Smokeless tobacco: Never Used  . Alcohol Use: No   OB History    No data available     Review of Systems  Constitutional: Negative for appetite change and fatigue.  HENT: Negative for congestion, ear discharge and sinus pressure.   Eyes: Negative for discharge.  Respiratory: Negative for cough.   Cardiovascular: Negative for chest pain.  Gastrointestinal: Positive for vomiting.  Negative for abdominal pain and diarrhea.       Vomiting blood  Genitourinary: Negative for frequency and hematuria.  Musculoskeletal: Negative for back pain.  Skin: Negative for rash.  Neurological: Negative for seizures and headaches.  Psychiatric/Behavioral: Negative for hallucinations.      Allergies  Procaine hcl; Codeine; Latex; and Penicillins  Home Medications   Prior to Admission medications   Medication Sig Start Date End Date Taking? Authorizing Provider  amitriptyline (ELAVIL) 25 MG tablet Take 1 tablet (25 mg total) by mouth at bedtime. 11/09/14  Yes Mary-Margaret Daphine Deutscher, FNP  Cholecalciferol (VITAMIN D3) 5000 UNITS CAPS Take 5,000 Units by mouth daily.   Yes Historical Provider, MD  Choline Fenofibrate (FENOFIBRIC ACID) 135 MG CPDR Take 1 capsule by  mouth daily. 11/09/14  Yes Mary-Margaret Daphine Deutscher, FNP  diazepam (VALIUM) 10 MG tablet TAKE 1 TABLET BY MOUTH DAILY AS NEEDED. 02/09/15  Yes Mary-Margaret Daphine Deutscher, FNP  fish oil-omega-3 fatty acids 1000 MG capsule Take 1 g by mouth every morning.    Yes Historical Provider, MD  furosemide (LASIX) 40 MG tablet Take 0.5 tablets (20 mg total) by mouth daily. 11/09/14  Yes Mary-Margaret Daphine Deutscher, FNP  gabapentin (NEURONTIN) 300 MG capsule 1 po BID Patient taking differently: Take 300 mg by mouth 2 (two) times daily.  11/09/14  Yes Mary-Margaret Daphine Deutscher, FNP  glimepiride (AMARYL) 2 MG tablet Take 1 tablet (2 mg total) by mouth daily before breakfast. 02/09/15  Yes Mary-Margaret Daphine Deutscher, FNP  ipratropium-albuterol (DUONEB) 0.5-2.5 (3) MG/3ML SOLN Take 3 mLs by nebulization every 4 (four) hours as needed. For shortness of breath 06/16/14  Yes Mary-Margaret Daphine Deutscher, FNP  levothyroxine (SYNTHROID, LEVOTHROID) 88 MCG tablet TAKE (1) TABLET ONCE DAILY IN THE MORNING BEFORE BREAKFAST. 11/09/14  Yes Mary-Margaret Daphine Deutscher, FNP  losartan (COZAAR) 25 MG tablet Take 1 tablet (25 mg total) by mouth daily. 11/09/14  Yes Mary-Margaret Daphine Deutscher, FNP  meclizine (ANTIVERT) 50 MG tablet Take 50 mg by mouth daily.    Yes Historical Provider, MD  metFORMIN (GLUCOPHAGE) 500 MG tablet Take 1 tablet (500 mg total) by mouth 2 (two) times daily with a meal. 02/09/15  Yes Mary-Margaret Daphine Deutscher, FNP  metoprolol succinate (TOPROL-XL) 25 MG 24 hr tablet TAKE (1) TABLET BY MOUTH ONCE DAILY. 11/09/14  Yes Mary-Margaret Daphine Deutscher, FNP  nystatin cream (MYCOSTATIN) Apply 1 application topically 2 (two) times daily. 02/09/15  Yes Mary-Margaret Daphine Deutscher, FNP  OVER THE COUNTER MEDICATION Take 1-3 tablets by mouth daily. Weight Loss Formula with Apple Cider Vinegar, Kelp, Lecithin, and B-6   Yes Historical Provider, MD  PATADAY 0.2 % SOLN Place 1 drop into both eyes daily.  12/16/14  Yes Historical Provider, MD  permethrin (ELIMITE) 5 % cream Apply 1 application topically once.  01/25/15  Yes Mary-Margaret Daphine Deutscher, FNP  potassium chloride SA (K-DUR,KLOR-CON) 20 MEQ tablet Take 1 tablet (20 mEq total) by mouth every morning. 11/09/14  Yes Mary-Margaret Daphine Deutscher, FNP  PROAIR HFA 108 (90 BASE) MCG/ACT inhaler INHALE 2 PUFFS INTO THE LUNGS EVERY 6 HOURS AS NEEDED FOR SHORTNESS OF BREATH. 06/06/14  Yes Mary-Margaret Daphine Deutscher, FNP  simvastatin (ZOCOR) 40 MG tablet Take 1 tablet (40 mg total) by mouth at bedtime. 11/09/14  Yes Mary-Margaret Daphine Deutscher, FNP  tiZANidine (ZANAFLEX) 4 MG tablet Take 1 tablet (4 mg total) by mouth every 6 (six) hours as needed for muscle spasms. 09/19/14  Yes Mary-Margaret Daphine Deutscher, FNP  traMADol (ULTRAM) 50 MG tablet TAKE 1 TABLET BY MOUTH EVERY 6 HOURS AS NEEDED 11/23/14  Yes Neysa Bonito  A Hawks, FNP  warfarin (COUMADIN) 5 MG tablet Take 0.5 tablets (2.5 mg total) by mouth every evening. 11/09/14  Yes Mary-Margaret Daphine Deutscher, FNP  glucose blood (ACCU-CHEK AVIVA) test strip Use to check blood glucose up to bid. Dx: E11.9 type 2 diabetes 11/03/14   Ernestina Penna, MD  Lancets (ACCU-CHEK SOFT [facility]) lancets Use to check BG up to bid. Dx: E11.9 - type 2 DM 11/03/14   Ernestina Penna, MD  pantoprazole (PROTONIX) 20 MG tablet Take 1 tablet (20 mg total) by mouth daily. 02/19/15   Bethann Berkshire, MD   BP 155/71 mmHg  Pulse 73  Temp(Src) 98.1 F (36.7 C) (Oral)  Resp 16  Ht 5\' 2"  (1.575 m)  Wt 194 lb 1 oz (88.026 kg)  BMI 35.49 kg/m2  SpO2 98% Physical Exam  Constitutional: She is oriented to person, place, and time. She appears well-developed.  HENT:  Head: Normocephalic.  Eyes: Conjunctivae and EOM are normal. No scleral icterus.  Neck: Neck supple. No thyromegaly present.  Cardiovascular: Normal rate and regular rhythm.  Exam reveals no gallop and no friction rub.   No murmur heard. Pulmonary/Chest: No stridor. She has no wheezes. She has no rales. She exhibits no tenderness.  Abdominal: She exhibits no distension. There is no tenderness. There is no rebound.   Musculoskeletal: Normal range of motion. She exhibits no edema.  Lymphadenopathy:    She has no cervical adenopathy.  Neurological: She is oriented to person, place, and time. She exhibits normal muscle tone. Coordination normal.  Skin: No rash noted. No erythema.  Psychiatric: She has a normal mood and affect. Her behavior is normal.    ED Course  Procedures (including critical care time) Labs Review Labs Reviewed  URINALYSIS, ROUTINE W REFLEX MICROSCOPIC (NOT AT [facility]) - Abnormal; Notable for the following:    Glucose, UA >1000 (*)    All other components within normal limits  COMPREHENSIVE METABOLIC PANEL - Abnormal; Notable for the following:    Chloride 100 (*)    CO2 34 (*)    Glucose, Bld 267 (*)    BUN 24 (*)    Creatinine, Ser 1.19 (*)    Calcium 8.7 (*)    GFR calc non Af Amer 46 (*)    GFR calc Af Amer 54 (*)    All other components within normal limits  PROTIME-INR - Abnormal; Notable for the following:    Prothrombin Time 16.6 (*)    All other components within normal limits  URINE MICROSCOPIC-ADD ON - Abnormal; Notable for the following:    Squamous Epithelial / LPF FEW (*)    Bacteria, UA FEW (*)    All other components within normal limits  CBC WITH DIFFERENTIAL/PLATELET    Imaging Review No results found. I have personally reviewed and evaluated these images and lab results as part of my medical decision-making.   EKG Interpretation None      MDM   Final diagnoses:  Hematemesis with nausea    Patient vomited blood once today. Labs unremarkable. Patient not orthostatic. Will hold Coumadin tonight. Start PPI. Have patient follow-up with primary care doctor this week.  Bethann Berkshire, MD 02/19/15 2201

## 2015-02-21 ENCOUNTER — Telehealth: Payer: Self-pay | Admitting: Nurse Practitioner

## 2015-02-21 DIAGNOSIS — E1165 Type 2 diabetes mellitus with hyperglycemia: Principal | ICD-10-CM

## 2015-02-21 DIAGNOSIS — IMO0002 Reserved for concepts with insufficient information to code with codable children: Secondary | ICD-10-CM

## 2015-02-21 DIAGNOSIS — E1140 Type 2 diabetes mellitus with diabetic neuropathy, unspecified: Secondary | ICD-10-CM

## 2015-02-21 MED ORDER — METFORMIN HCL 500 MG PO TABS: 500.0000 mg | ORAL_TABLET | Freq: Two times a day (BID) | ORAL | Status: DC

## 2015-02-21 NOTE — Telephone Encounter (Signed)
done

## 2015-02-28 ENCOUNTER — Encounter: Payer: Self-pay | Admitting: Family Medicine

## 2015-02-28 ENCOUNTER — Ambulatory Visit (INDEPENDENT_AMBULATORY_CARE_PROVIDER_SITE_OTHER): Payer: Medicare Other | Admitting: Family Medicine

## 2015-02-28 VITALS — BP 132/78 | HR 66 | Temp 97.8°F | Ht 62.0 in | Wt 187.0 lb

## 2015-02-28 DIAGNOSIS — K920 Hematemesis: Secondary | ICD-10-CM | POA: Diagnosis not present

## 2015-02-28 DIAGNOSIS — R110 Nausea: Secondary | ICD-10-CM

## 2015-02-28 DIAGNOSIS — I2510 Atherosclerotic heart disease of native coronary artery without angina pectoris: Secondary | ICD-10-CM

## 2015-02-28 LAB — POCT INR: INR: 1.3

## 2015-02-28 MED ORDER — PANTOPRAZOLE SODIUM 20 MG PO TBEC: 20.0000 mg | DELAYED_RELEASE_TABLET | Freq: Two times a day (BID) | ORAL | Status: DC

## 2015-02-28 NOTE — Progress Notes (Signed)
   HPI  Patient presents today for ER follow-up  She was seen 9 days ago after a single episode of hematemesis.  She states that she threw up one large volume vomit and then stopped. She had some slight dizziness that day. Her workup was negative except for a subtherapeutic INR.  She was started on Protonix, which she did not start taking.  She is taking Coumadin for repeat pulmonary embolism.  Today she states that she is not nauseous and she has not had any additional episodes of hematemesis. She is slightly dizzy but states that this is normal for her.  PMH: Smoking status noted ROS: Per HPI  Objective: BP 132/78 mmHg  Pulse 66  Temp(Src) 97.8 F (36.6 C) (Oral)  Ht  (1.575 m)  Wt 187 lb (84.823 kg)  BMI 34.19 kg/m2 Gen: NAD, alert, cooperative with exam HEENT: NCAT CV: RRR, good S1/S2, no murmur Resp: CTABL, no wheezes, non-labored Abd: SNTND, BS present, no guarding or organomegaly Ext: No edema, warm Neuro: Alert and oriented, strength 5/5 and sensation intact in all 4 extremities, EOMI  Assessment and plan:  # Hematemesis Single episode, subtherapeutic INR at the time Check INR today, adjust Coumadin with goal 2-3 CBC, urgent referral to GI Twice a day PPI to treat for presumed ulcer Not believe that this is an emergent situation due to no repeat episodes, however I do believe it's in her best interest to get an urgent referral and evaluation with GI.  Coumadin management INR today is 1.3 Increase Coumadin to 2.5 mg daily except for 5 mg on Monday, Wednesday, and Friday. This is an increase of almost 10% Follow-up with clinical pharmacist in 2 weeks for repeat INR and further Coumadin management Provided her with a handwritten chart similar to the clinical pharmacist showing an increase to 5 mg daily on Monday, Wednesday, and Friday, she expresses understanding.   Orders Placed This Encounter  Procedures  . CBC  . Ambulatory referral to  Gastroenterology    Referral Priority:  Urgent    Referral Type:  Consultation    Referral Reason:  Specialty Services Required    Number of Visits Requested:  1    Meds ordered this encounter  Medications  . pantoprazole (PROTONIX) 40 MG tablet    Sig:   . pantoprazole (PROTONIX) 20 MG tablet    Sig: Take 1 tablet (20 mg total) by mouth 2 (two) times daily.    Dispense:  60 tablet    Refill:  2    Murtis Sink, MD [facility] 02/28/2015, 3:31 PM

## 2015-02-28 NOTE — Patient Instructions (Addendum)
Great to meet you!  Start the pantoprazole twice daily, I sent a new prescription We will check your blood counts We will arrange a GI appointment  Come back in 2 weeks to see Tammy for checking your coumadin  Come back to see me in 4-6 weeks to see me

## 2015-03-01 ENCOUNTER — Other Ambulatory Visit: Payer: Self-pay | Admitting: Pharmacist

## 2015-03-01 LAB — CBC
HEMATOCRIT: 41 % (ref 34.0–46.6)
Hemoglobin: 13.6 g/dL (ref 11.1–15.9)
MCH: 30 pg (ref 26.6–33.0)
MCHC: 33.2 g/dL (ref 31.5–35.7)
MCV: 90 fL (ref 79–97)
Platelets: 278 10*3/uL (ref 150–379)
RBC: 4.54 x10E6/uL (ref 3.77–5.28)
RDW: 13.8 % (ref 12.3–15.4)
WBC: 7 10*3/uL (ref 3.4–10.8)

## 2015-03-02 ENCOUNTER — Telehealth: Payer: Self-pay | Admitting: Nurse Practitioner

## 2015-03-15 ENCOUNTER — Ambulatory Visit (INDEPENDENT_AMBULATORY_CARE_PROVIDER_SITE_OTHER): Payer: Medicare Other | Admitting: Pharmacist

## 2015-03-15 DIAGNOSIS — Z86711 Personal history of pulmonary embolism: Secondary | ICD-10-CM | POA: Diagnosis not present

## 2015-03-15 DIAGNOSIS — Z23 Encounter for immunization: Secondary | ICD-10-CM

## 2015-03-15 LAB — POCT INR: INR: 1.4

## 2015-03-15 NOTE — Progress Notes (Deleted)
Subjective:     Indication: {indication for anticoagulation:17259} Bleeding signs/symptoms: {none/mild/yes:12801} Thromboembolic signs/symptoms: {none/mild/yes:12801}  Missed Coumadin doses: {none/this week/over ZOXW:96045} Medication changes: {yes***/no:17258} Dietary changes: {yes***/no:17258} Bacterial/viral infection: {yes***/no:17258} Other concerns: {yes***/no:17258}  {Common ambulatory SmartLinks:19316}  Review of Systems {ros; complete:30496}   Objective:    INR Today: *** Current dose: ***   Assessment:    {therapeutic/sub/supra:12803} INR for goal of {2-3/2.5/3.5/3-4:12804}   Plan:    1. New dose: {no change:13088}   2. Next INR: {time frame/weeks 1-4:12805}

## 2015-03-15 NOTE — Patient Instructions (Signed)
Anticoagulation Dose Instructions as of 03/15/2015      [name] Tue Wed Thu Fri Sat   New Dose 2.5 mg 5 mg 2.5 mg 10 mg 5 mg 5 mg 2.5 mg                       Alt Week 2.5 mg 5 mg 2.5 mg 5 mg 5 mg 5 mg 2.5 mg        Description        Take 2 tablets today - Wednesday, October 5th,  then increase dose to 1 tablet on Mondays, Wednesdays, Thursdays and Fridays and 1/2 tablet all other days.     INR was 1.4 today (too low / thick) goal is 2.3 to 3.0

## 2015-03-24 ENCOUNTER — Ambulatory Visit (INDEPENDENT_AMBULATORY_CARE_PROVIDER_SITE_OTHER): Payer: Medicare Other | Admitting: Pharmacist

## 2015-03-24 DIAGNOSIS — Z86711 Personal history of pulmonary embolism: Secondary | ICD-10-CM | POA: Diagnosis not present

## 2015-03-24 DIAGNOSIS — Z7901 Long term (current) use of anticoagulants: Secondary | ICD-10-CM | POA: Diagnosis not present

## 2015-03-24 DIAGNOSIS — H9193 Unspecified hearing loss, bilateral: Secondary | ICD-10-CM

## 2015-03-24 LAB — POCT INR: INR: 3.6

## 2015-03-24 NOTE — Patient Instructions (Signed)
Anticoagulation Dose Instructions as of 03/24/2015      Monica SmilesSun Mon Tue Wed Thu Fri Sat   New Dose 2.5 mg 5 mg 2.5 mg 2.5 mg 2.5 mg 5 mg 2.5 mg    Description        No warfarin today - Then decrease warfarin dose to 5mg  - take 1 tablet on Mondays and Fridays and take 1/2 tablet all other days.     INR was 3.6 today

## 2015-03-27 IMAGING — CR DG FOREARM 2V*L*
2 series · 2 of 2 positions shown · non-contrast
Comparison: None.

CLINICAL DATA: Arm injury, left forearm bruising

LEFT FOREARM - 2 VIEW

[view not recorded (1 of 2)]
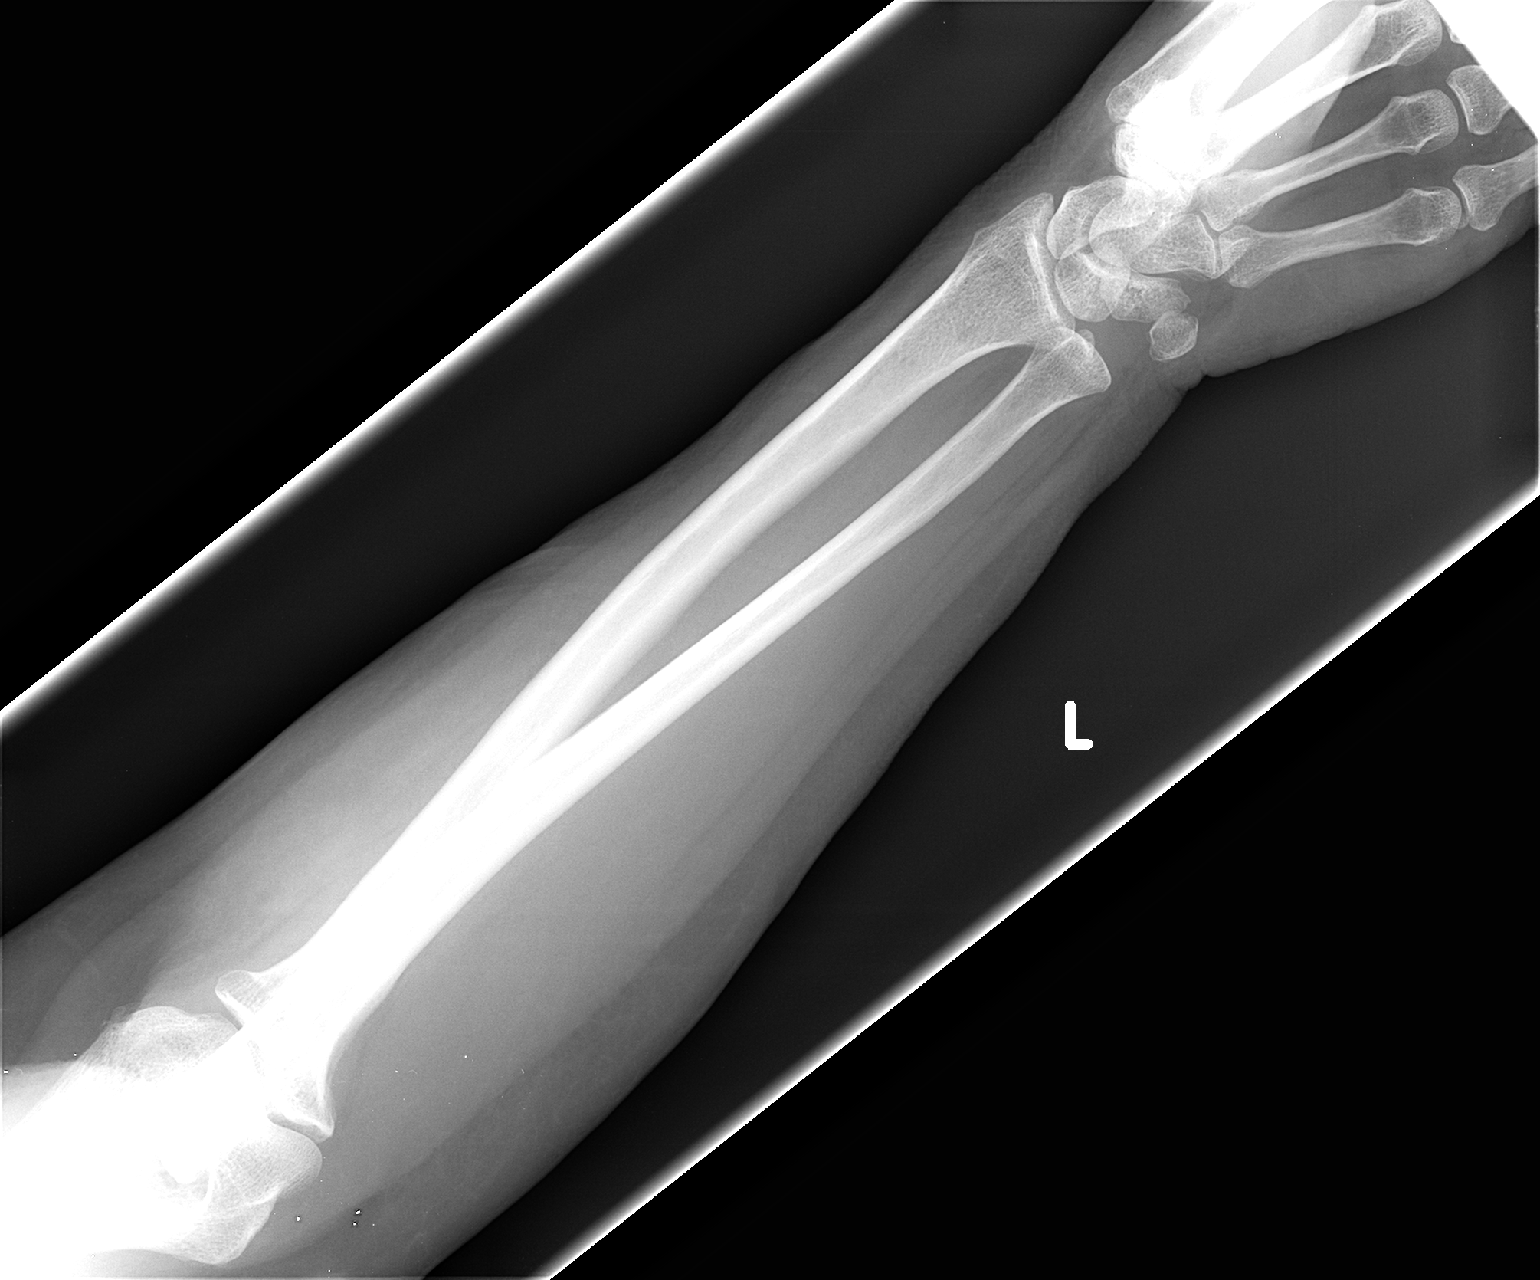

[view not recorded (2 of 2)]
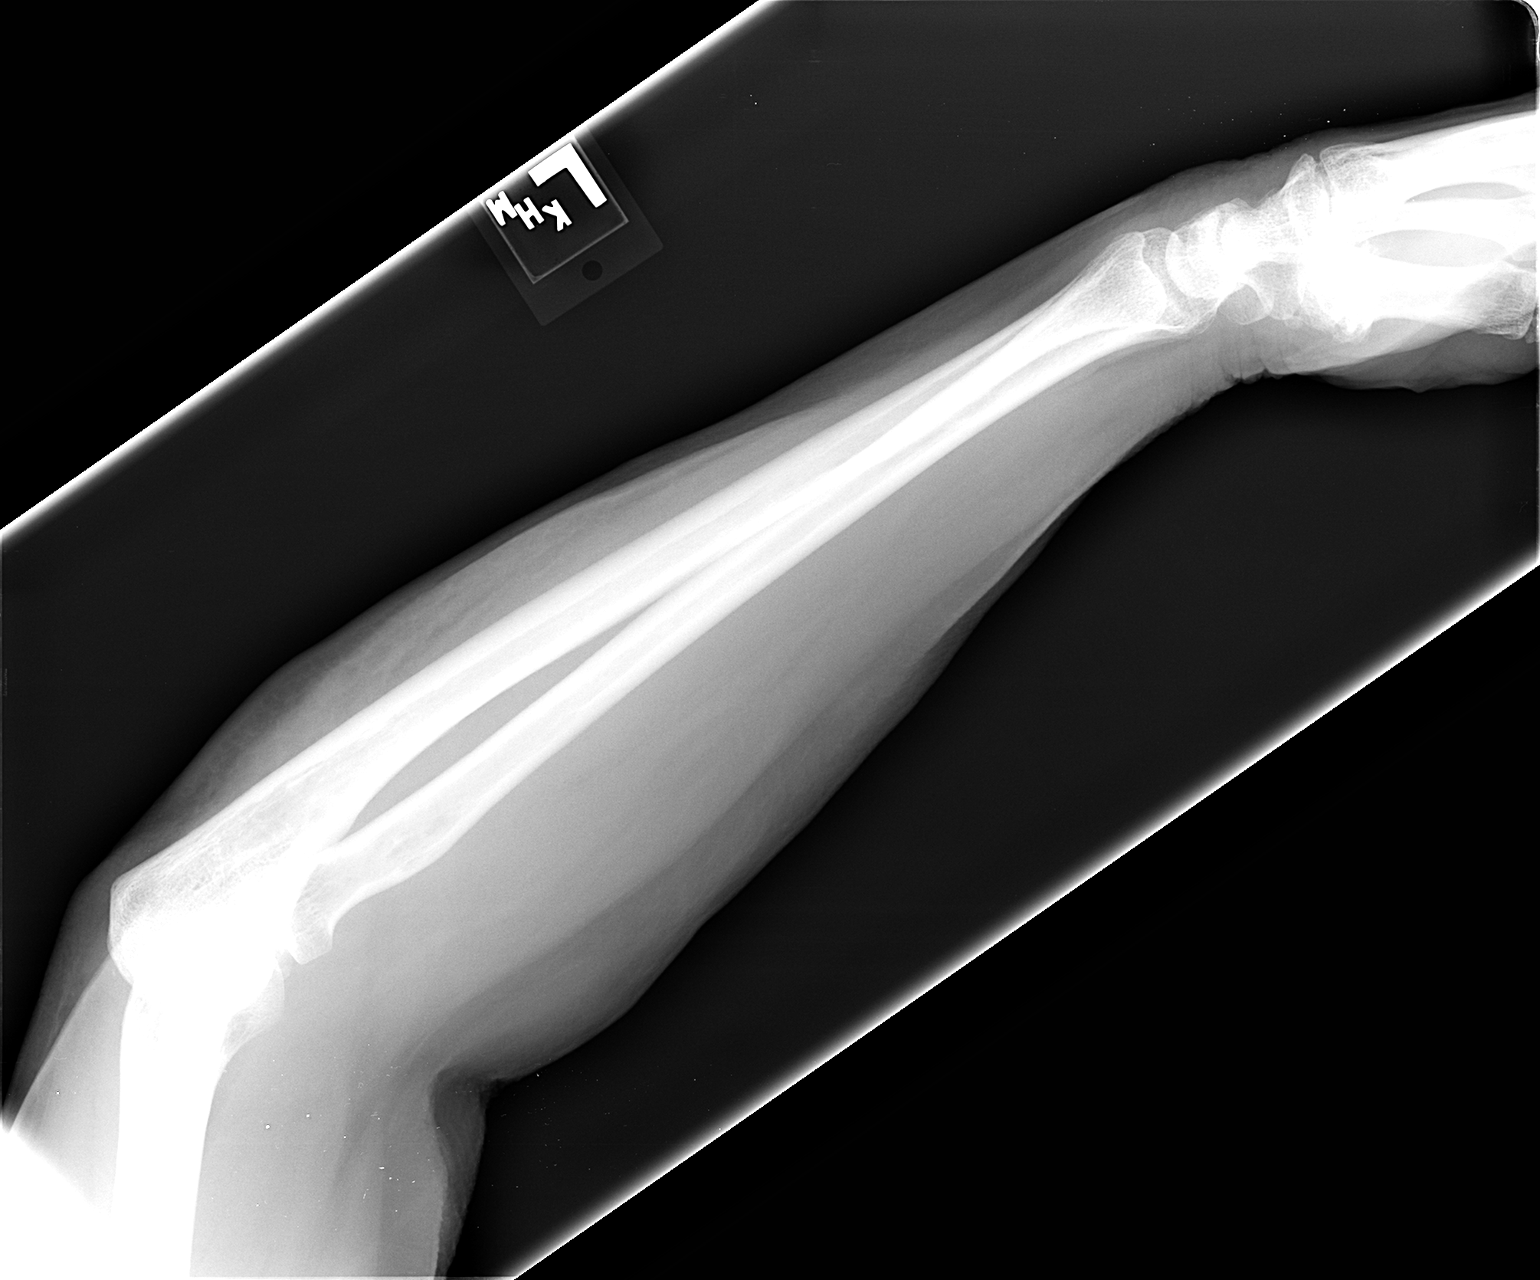

[2 of 2 positions shown; findings below may reference images not displayed]

FINDINGS: No fracture or dislocation is seen.

The joint spaces are preserved.

Mild posterolateral soft tissue swelling.
IMPRESSION: No fracture or dislocation is seen.

## 2015-04-04 ENCOUNTER — Encounter: Payer: Self-pay | Admitting: Family Medicine

## 2015-04-04 ENCOUNTER — Ambulatory Visit (INDEPENDENT_AMBULATORY_CARE_PROVIDER_SITE_OTHER): Payer: Medicare Other | Admitting: Family Medicine

## 2015-04-04 VITALS — BP 127/73 | HR 63 | Temp 97.4°F | Ht 62.0 in | Wt 188.4 lb

## 2015-04-04 DIAGNOSIS — I2510 Atherosclerotic heart disease of native coronary artery without angina pectoris: Secondary | ICD-10-CM | POA: Diagnosis not present

## 2015-04-04 DIAGNOSIS — Z7901 Long term (current) use of anticoagulants: Secondary | ICD-10-CM | POA: Diagnosis not present

## 2015-04-04 DIAGNOSIS — N3281 Overactive bladder: Secondary | ICD-10-CM | POA: Diagnosis not present

## 2015-04-04 DIAGNOSIS — Z86711 Personal history of pulmonary embolism: Secondary | ICD-10-CM | POA: Diagnosis not present

## 2015-04-04 DIAGNOSIS — R8299 Other abnormal findings in urine: Secondary | ICD-10-CM | POA: Diagnosis not present

## 2015-04-04 DIAGNOSIS — R82998 Other abnormal findings in urine: Secondary | ICD-10-CM

## 2015-04-04 LAB — POCT UA - MICROSCOPIC ONLY
CASTS, UR, LPF, POC: NEGATIVE
Crystals, Ur, HPF, POC: NEGATIVE
Mucus, UA: NEGATIVE
RBC, urine, microscopic: NEGATIVE
YEAST UA: NEGATIVE

## 2015-04-04 LAB — POCT INR: INR: 4.5

## 2015-04-04 LAB — POCT URINALYSIS DIPSTICK
GLUCOSE UA: NEGATIVE
Ketones, UA: NEGATIVE
Leukocytes, UA: NEGATIVE
NITRITE UA: NEGATIVE
PH UA: 6
PROTEIN UA: NEGATIVE
RBC UA: NEGATIVE
Spec Grav, UA: 1.015
UROBILINOGEN UA: 2

## 2015-04-04 MED ORDER — OXYBUTYNIN CHLORIDE 5 MG PO TABS: 5.0000 mg | ORAL_TABLET | Freq: Three times a day (TID) | ORAL | Status: DC

## 2015-04-04 NOTE — Progress Notes (Signed)
   HPI  Patient presents today in follow-up for INR and hematemesis  A metastasis has not recurred again, she states that she has not gotten a GI appointment arranged yet. [place]  She is taking warfarin as prescribed, no new medications As any missed doses or additional doses.  She states that she's developed urinary urgency, stress incontinence, and a feeling of needing to go the restroom and she really doesn't have to over the last several weeks. She also notes urinary frequency and dark urine over the last 2-3 weeks.  She denies any blood in her stool  PMH: Smoking status noted ROS: Per HPI  Objective: BP 127/73 mmHg  Pulse 63  Temp(Src) 97.4 F (36.3 C) (Oral)  Ht 5\' 2"  (1.575 m)  Wt 188 lb 6.4 oz (85.458 kg)  BMI 34.45 kg/m2 Gen: NAD, alert, cooperative with exam HEENT: NCAT CV: RRR, good S1/S2, no murmur Resp: CTABL, no wheezes, non-labored Ext: No edema, warm Neuro: Alert and oriented, No gross deficits MSK: no lumbar bony or paraspinal tenderness to palpation  Assessment and plan:  # Supratherapeutic INR, chronic anticoagulation INR 4.5 today, no signs of bleeding Advised her to stop Coumadin 2 days and then reduce to 2.5 mg daily Currently 2.5 mg 5 days per week and 5 mg on Monday and Wednesday, 22.5 mg now down to 17.5 mg weekly almost a 23% reduction Follow-up INR in one week with our clinical pharmacist  # Back pain, overactive bladder Unclear overactive bladder is causing pain She is concerned about you TIA, however urine sample not consistent with this. Try oxybutynin for bladder spasms   Orders Placed This Encounter  Procedures  . POCT UA - Microscopic Only  . POCT urinalysis dipstick    Meds ordered this encounter  Medications  . oxybutynin (DITROPAN) 5 MG tablet    Sig: Take 1 tablet (5 mg total) by mouth 3 (three) times daily.    Dispense:  90 tablet    Refill:  2    Murtis SinkSam Garreth Burnsworth, MD [facility] 04/04/2015, 3:24 PM

## 2015-04-04 NOTE — Patient Instructions (Signed)
Great to see you!  Try the oxybutinin for your bladder spasms  Hold coumadin for 2 days Decrease your coumadin dose to 1/2 pill (2.5 mg) every day  Come back tio see our pharmacist next Monday for an INR check.

## 2015-04-05 ENCOUNTER — Telehealth: Payer: Self-pay | Admitting: Family Medicine

## 2015-04-06 NOTE — Telephone Encounter (Signed)
Patient called.

## 2015-04-11 ENCOUNTER — Ambulatory Visit (INDEPENDENT_AMBULATORY_CARE_PROVIDER_SITE_OTHER): Payer: Medicare Other | Admitting: Pharmacist Clinician (PhC)/ Clinical Pharmacy Specialist

## 2015-04-11 DIAGNOSIS — Z86711 Personal history of pulmonary embolism: Secondary | ICD-10-CM | POA: Diagnosis not present

## 2015-04-11 DIAGNOSIS — Z7901 Long term (current) use of anticoagulants: Secondary | ICD-10-CM | POA: Diagnosis not present

## 2015-04-11 LAB — POCT INR: INR: 2.3

## 2015-04-11 NOTE — Patient Instructions (Signed)
Anticoagulation Dose Instructions as of 04/11/2015      Monica SmilesSun Mon Tue Wed Thu Fri Sat   New Dose 2.5 mg 5 mg 2.5 mg 2.5 mg 2.5 mg 5 mg 2.5 mg

## 2015-04-12 ENCOUNTER — Other Ambulatory Visit: Payer: Self-pay

## 2015-04-12 MED ORDER — TIZANIDINE HCL 4 MG PO TABS: 4.0000 mg | ORAL_TABLET | Freq: Four times a day (QID) | ORAL | Status: DC | PRN

## 2015-04-19 ENCOUNTER — Other Ambulatory Visit: Payer: Self-pay | Admitting: Nurse Practitioner

## 2015-04-20 NOTE — Telephone Encounter (Signed)
rx called into pharmacy

## 2015-04-20 NOTE — Telephone Encounter (Signed)
Please advise on refill- last seen 04/04/15, has follow up with Dr. Ermalinda MemosBradshaw on 05/11/15.

## 2015-04-20 NOTE — Telephone Encounter (Signed)
Please call in valium 10mg  1 po qhs #31 with 1 refills

## 2015-04-21 ENCOUNTER — Encounter: Payer: Self-pay | Admitting: Pharmacist

## 2015-04-25 ENCOUNTER — Ambulatory Visit (INDEPENDENT_AMBULATORY_CARE_PROVIDER_SITE_OTHER): Payer: Medicare Other | Admitting: Pharmacist Clinician (PhC)/ Clinical Pharmacy Specialist

## 2015-04-25 DIAGNOSIS — Z86711 Personal history of pulmonary embolism: Secondary | ICD-10-CM | POA: Diagnosis not present

## 2015-04-25 DIAGNOSIS — Z7901 Long term (current) use of anticoagulants: Secondary | ICD-10-CM | POA: Diagnosis not present

## 2015-04-25 LAB — POCT INR: INR: 2.5

## 2015-04-25 NOTE — Patient Instructions (Signed)
Anticoagulation Dose Instructions as of 04/25/2015      Monica SmilesSun Mon Tue Wed Thu Fri Sat   New Dose 2.5 mg 5 mg 2.5 mg 2.5 mg 2.5 mg 5 mg 2.5 mg    Description        Continue to take 1 tablet (5mg ) on Monday and Friday and 1/2 tablet (2.5mg ) every other day.

## 2015-05-01 ENCOUNTER — Other Ambulatory Visit: Payer: Self-pay | Admitting: *Deleted

## 2015-05-01 DIAGNOSIS — Z86711 Personal history of pulmonary embolism: Secondary | ICD-10-CM

## 2015-05-01 MED ORDER — WARFARIN SODIUM 5 MG PO TABS: ORAL_TABLET | ORAL | Status: DC

## 2015-05-01 NOTE — Telephone Encounter (Signed)
Patient aware that rx is ready to be picked up.  

## 2015-05-05 ENCOUNTER — Other Ambulatory Visit: Payer: Self-pay

## 2015-05-05 DIAGNOSIS — K920 Hematemesis: Secondary | ICD-10-CM

## 2015-05-05 MED ORDER — PANTOPRAZOLE SODIUM 20 MG PO TBEC: 20.0000 mg | DELAYED_RELEASE_TABLET | Freq: Two times a day (BID) | ORAL | Status: DC

## 2015-05-11 ENCOUNTER — Ambulatory Visit: Payer: Medicare Other | Admitting: Family Medicine

## 2015-05-14 ENCOUNTER — Other Ambulatory Visit: Payer: Self-pay | Admitting: Family

## 2015-05-15 ENCOUNTER — Encounter: Payer: Self-pay | Admitting: Family Medicine

## 2015-05-15 ENCOUNTER — Ambulatory Visit (INDEPENDENT_AMBULATORY_CARE_PROVIDER_SITE_OTHER): Payer: Medicare Other | Admitting: Family Medicine

## 2015-05-15 VITALS — BP 113/55 | HR 57 | Temp 97.4°F | Ht 62.0 in | Wt 190.8 lb

## 2015-05-15 DIAGNOSIS — Z7901 Long term (current) use of anticoagulants: Secondary | ICD-10-CM | POA: Diagnosis not present

## 2015-05-15 DIAGNOSIS — I2510 Atherosclerotic heart disease of native coronary artery without angina pectoris: Secondary | ICD-10-CM | POA: Diagnosis not present

## 2015-05-15 DIAGNOSIS — F419 Anxiety disorder, unspecified: Secondary | ICD-10-CM

## 2015-05-15 DIAGNOSIS — Z86711 Personal history of pulmonary embolism: Secondary | ICD-10-CM

## 2015-05-15 DIAGNOSIS — F418 Other specified anxiety disorders: Secondary | ICD-10-CM

## 2015-05-15 DIAGNOSIS — F329 Major depressive disorder, single episode, unspecified: Secondary | ICD-10-CM

## 2015-05-15 DIAGNOSIS — E1142 Type 2 diabetes mellitus with diabetic polyneuropathy: Secondary | ICD-10-CM

## 2015-05-15 LAB — POCT GLYCOSYLATED HEMOGLOBIN (HGB A1C): Hemoglobin A1C: 7

## 2015-05-15 LAB — POCT INR: INR: 3.6

## 2015-05-15 MED ORDER — DULOXETINE HCL 30 MG PO CPEP: 30.0000 mg | ORAL_CAPSULE | Freq: Every day | ORAL | Status: DC

## 2015-05-15 MED ORDER — LORAZEPAM 1 MG PO TABS: 1.0000 mg | ORAL_TABLET | Freq: Every day | ORAL | Status: DC | PRN

## 2015-05-15 MED ORDER — TRAMADOL HCL 50 MG PO TABS: 50.0000 mg | ORAL_TABLET | Freq: Four times a day (QID) | ORAL | Status: DC | PRN

## 2015-05-15 NOTE — Patient Instructions (Signed)
   Great to see you!  Follow up in 2 weeks, discuss mood and recheck INR  Stop valium, replace it with lorazepam  Stop amitriptyline, replace it with cymbalta  Use tramadol only as needed, you do not need it daily   Decrease your warfarin dose.  Take only 1/2 pill daily

## 2015-05-15 NOTE — Telephone Encounter (Signed)
Patient last seen in office on 04-04-15. Rx last filled on 11-23-14 for #45. Please advise

## 2015-05-15 NOTE — Progress Notes (Signed)
   HPI  Patient presents today discussed diabetes, anxiety, chronic pain.  Diabetes Good medication compliance She has no idea what her blood sugars are currently her glucose meter which shows an average blood sugar of 154, she was checking very sporadically. [place] blood sugars Not really watching her diet.  Depression, anxiety She's been on Valium for several years. She takes approximately 1 Valium daily. She has been on Lexapro previously with good improvement. She denies suicidal ideation.  Chronic pain Vague pain described as right-sided back pain that happens occasionally, it's usually helped by Valium   PMH: Smoking status noted ROS: Per HPI  Objective: BP 113/55 mmHg  Pulse 57  Temp(Src) 97.4 F (36.3 C) (Oral)  Ht 5\' 2"  (1.575 m)  Wt 190 lb 12.8 oz (86.546 kg)  BMI 34.89 kg/m2 Gen: NAD, alert, cooperative with exam HEENT: NCAT CV: RRR, good S1/S2, no murmur Resp: CTABL, no wheezes, non-labored Ext: No edema, warm Neuro: Alert and oriented, No gross deficits  Assessment and plan:  # diabetes Control improving A1c now7.0 Continue current medications including Amaryl,metformin Monitor GFR closely withmetformin, recent GFR 54 Repeat every 6 months  # anxiety Considering her age I'm changing her from Valium to Ativan, as Valium as fat-soluble and more likely to have decreased clearance in the elderly. Adding on SNRI Cymbalta hopefully this will help with pain anxiety, and overactive bladder Topping amitriptyline as she is complaining of dry mouth.  # pain Declined tramadol for now, recommended Tylenol Unclear etiology hopefully Cymbalta will also help  supratherapeutic INR,history of pulmonary embolism Decrease Coumadin dose to one half pill, 2.5 mg, daily t is very unclear  Her actual doses, I had her confirm her dose with the teacher back method   Orders Placed This Encounter  Procedures  . POCT glycosylated hemoglobin (Hb A1C)    Meds ordered  this encounter  Medications  . DULoxetine (CYMBALTA) 30 MG capsule    Sig: Take 1 capsule (30 mg total) by mouth daily.    Dispense:  30 capsule    Refill:  3  . LORazepam (ATIVAN) 1 MG tablet    Sig: Take 1 tablet (1 mg total) by mouth daily as needed for anxiety.    Dispense:  30 tablet    Refill:  1    Please discontinue valium, please do not refill any sooner than 30 days.  . traMADol (ULTRAM) 50 MG tablet    Sig: Take 1 tablet (50 mg total) by mouth every 6 (six) hours as needed.    Dispense:  45 tablet    Refill:  1    Please do not refill any sooner than 30 days    Murtis SinkSam Reyna Lorenzi, MD [facility] 05/15/2015, 2:29 PM

## 2015-05-17 ENCOUNTER — Telehealth: Payer: Self-pay | Admitting: Family Medicine

## 2015-05-17 NOTE — Telephone Encounter (Signed)
Spoke to pharmacist and also to pt and reviewed all current medications. Pt voiced understanding.

## 2015-05-23 ENCOUNTER — Emergency Department (HOSPITAL_COMMUNITY): Payer: Medicare Other

## 2015-05-23 ENCOUNTER — Observation Stay (HOSPITAL_COMMUNITY)
Admission: EM | Admit: 2015-05-23 | Discharge: 2015-05-24 | Disposition: A | Discharge status: Home / Self Care | Payer: Medicare Other | Attending: Internal Medicine | Admitting: Internal Medicine

## 2015-05-23 ENCOUNTER — Encounter (HOSPITAL_COMMUNITY): Payer: Self-pay | Admitting: Emergency Medicine

## 2015-05-23 DIAGNOSIS — Z880 Allergy status to penicillin: Secondary | ICD-10-CM | POA: Insufficient documentation

## 2015-05-23 DIAGNOSIS — E785 Hyperlipidemia, unspecified: Secondary | ICD-10-CM | POA: Diagnosis not present

## 2015-05-23 DIAGNOSIS — Z91040 Latex allergy status: Secondary | ICD-10-CM | POA: Insufficient documentation

## 2015-05-23 DIAGNOSIS — F329 Major depressive disorder, single episode, unspecified: Secondary | ICD-10-CM | POA: Diagnosis not present

## 2015-05-23 DIAGNOSIS — F419 Anxiety disorder, unspecified: Secondary | ICD-10-CM

## 2015-05-23 DIAGNOSIS — Z86711 Personal history of pulmonary embolism: Secondary | ICD-10-CM | POA: Diagnosis not present

## 2015-05-23 DIAGNOSIS — G8929 Other chronic pain: Secondary | ICD-10-CM | POA: Diagnosis not present

## 2015-05-23 DIAGNOSIS — R410 Disorientation, unspecified: Secondary | ICD-10-CM | POA: Diagnosis not present

## 2015-05-23 DIAGNOSIS — R011 Cardiac murmur, unspecified: Secondary | ICD-10-CM | POA: Insufficient documentation

## 2015-05-23 DIAGNOSIS — E039 Hypothyroidism, unspecified: Secondary | ICD-10-CM | POA: Diagnosis present

## 2015-05-23 DIAGNOSIS — Z7901 Long term (current) use of anticoagulants: Secondary | ICD-10-CM | POA: Insufficient documentation

## 2015-05-23 DIAGNOSIS — J439 Emphysema, unspecified: Secondary | ICD-10-CM | POA: Insufficient documentation

## 2015-05-23 DIAGNOSIS — I10 Essential (primary) hypertension: Secondary | ICD-10-CM | POA: Diagnosis present

## 2015-05-23 DIAGNOSIS — M810 Age-related osteoporosis without current pathological fracture: Secondary | ICD-10-CM | POA: Insufficient documentation

## 2015-05-23 DIAGNOSIS — Z79899 Other long term (current) drug therapy: Secondary | ICD-10-CM | POA: Insufficient documentation

## 2015-05-23 DIAGNOSIS — N179 Acute kidney failure, unspecified: Secondary | ICD-10-CM | POA: Diagnosis not present

## 2015-05-23 DIAGNOSIS — E119 Type 2 diabetes mellitus without complications: Secondary | ICD-10-CM | POA: Insufficient documentation

## 2015-05-23 DIAGNOSIS — K219 Gastro-esophageal reflux disease without esophagitis: Secondary | ICD-10-CM | POA: Insufficient documentation

## 2015-05-23 DIAGNOSIS — R4182 Altered mental status, unspecified: Principal | ICD-10-CM | POA: Insufficient documentation

## 2015-05-23 DIAGNOSIS — J449 Chronic obstructive pulmonary disease, unspecified: Secondary | ICD-10-CM | POA: Diagnosis present

## 2015-05-23 DIAGNOSIS — Z9889 Other specified postprocedural states: Secondary | ICD-10-CM | POA: Insufficient documentation

## 2015-05-23 DIAGNOSIS — D649 Anemia, unspecified: Secondary | ICD-10-CM | POA: Diagnosis not present

## 2015-05-23 DIAGNOSIS — F418 Other specified anxiety disorders: Secondary | ICD-10-CM | POA: Diagnosis not present

## 2015-05-23 DIAGNOSIS — E669 Obesity, unspecified: Secondary | ICD-10-CM

## 2015-05-23 DIAGNOSIS — Z87891 Personal history of nicotine dependence: Secondary | ICD-10-CM | POA: Insufficient documentation

## 2015-05-23 DIAGNOSIS — N189 Chronic kidney disease, unspecified: Secondary | ICD-10-CM | POA: Diagnosis not present

## 2015-05-23 DIAGNOSIS — G9340 Encephalopathy, unspecified: Secondary | ICD-10-CM | POA: Diagnosis not present

## 2015-05-23 DIAGNOSIS — F32A Depression, unspecified: Secondary | ICD-10-CM | POA: Diagnosis present

## 2015-05-23 DIAGNOSIS — I129 Hypertensive chronic kidney disease with stage 1 through stage 4 chronic kidney disease, or unspecified chronic kidney disease: Secondary | ICD-10-CM | POA: Insufficient documentation

## 2015-05-23 DIAGNOSIS — E1121 Type 2 diabetes mellitus with diabetic nephropathy: Secondary | ICD-10-CM | POA: Diagnosis present

## 2015-05-23 HISTORY — DX: Major depressive disorder, single episode, unspecified: F32.9

## 2015-05-23 HISTORY — DX: Anxiety disorder, unspecified: F41.9

## 2015-05-23 HISTORY — DX: Other chronic pain: G89.29

## 2015-05-23 LAB — COMPREHENSIVE METABOLIC PANEL
ALBUMIN: 3.9 g/dL (ref 3.5–5.0)
ALK PHOS: 38 U/L (ref 38–126)
ALT: 22 U/L (ref 14–54)
ANION GAP: 10 (ref 5–15)
AST: 28 U/L (ref 15–41)
BILIRUBIN TOTAL: 0.4 mg/dL (ref 0.3–1.2)
BUN: 32 mg/dL — ABNORMAL HIGH (ref 6–20)
CO2: 30 mmol/L (ref 22–32)
Calcium: 9.7 mg/dL (ref 8.9–10.3)
Chloride: 98 mmol/L — ABNORMAL LOW (ref 101–111)
Creatinine, Ser: 1.35 mg/dL — ABNORMAL HIGH (ref 0.44–1.00)
GFR calc non Af Amer: 40 mL/min — ABNORMAL LOW (ref >60)
GFR, EST AFRICAN AMERICAN: 46 mL/min — AB (ref >60)
GLUCOSE: 136 mg/dL — AB (ref 65–99)
POTASSIUM: 3.8 mmol/L (ref 3.5–5.1)
Sodium: 138 mmol/L (ref 135–145)
TOTAL PROTEIN: 7.5 g/dL (ref 6.5–8.1)

## 2015-05-23 LAB — LACTIC ACID, PLASMA
LACTIC ACID, VENOUS: 1.3 mmol/L (ref 0.5–2.0)
LACTIC ACID, VENOUS: 0.8 mmol/L (ref 0.5–2.0)

## 2015-05-23 LAB — GLUCOSE, CAPILLARY
GLUCOSE-CAPILLARY: 82 mg/dL (ref 65–99)
GLUCOSE-CAPILLARY: 207 mg/dL — AB (ref 65–99)
Glucose-Capillary: 78 mg/dL (ref 65–99)

## 2015-05-23 LAB — RAPID URINE DRUG SCREEN, HOSP PERFORMED
Amphetamines: NOT DETECTED
BARBITURATES: NOT DETECTED
BENZODIAZEPINES: POSITIVE — AB
Cocaine: NOT DETECTED
Opiates: NOT DETECTED
TETRAHYDROCANNABINOL: NOT DETECTED

## 2015-05-23 LAB — URINALYSIS, ROUTINE W REFLEX MICROSCOPIC
BILIRUBIN URINE: NEGATIVE
GLUCOSE, UA: NEGATIVE mg/dL
Hgb urine dipstick: NEGATIVE
Ketones, ur: NEGATIVE mg/dL
Leukocytes, UA: NEGATIVE
Nitrite: NEGATIVE
PH: 5.5 (ref 5.0–8.0)
Protein, ur: NEGATIVE mg/dL
SPECIFIC GRAVITY, URINE: 1.015 (ref 1.005–1.030)

## 2015-05-23 LAB — PROTIME-INR
INR: 3.42 — ABNORMAL HIGH (ref 0.00–1.49)
PROTHROMBIN TIME: 33.7 s — AB (ref 11.6–15.2)

## 2015-05-23 LAB — CBC WITH DIFFERENTIAL/PLATELET
Basophils Absolute: 0 10*3/uL (ref 0.0–0.1)
Basophils Relative: 1 %
EOS ABS: 0.1 10*3/uL (ref 0.0–0.7)
EOS PCT: 2 %
HCT: 37.9 % (ref 36.0–46.0)
Hemoglobin: 12.4 g/dL (ref 12.0–15.0)
LYMPHS ABS: 1.6 10*3/uL (ref 0.7–4.0)
Lymphocytes Relative: 21 %
MCH: 30 pg (ref 26.0–34.0)
MCHC: 32.7 g/dL (ref 30.0–36.0)
MCV: 91.5 fL (ref 78.0–100.0)
MONO ABS: 0.8 10*3/uL (ref 0.1–1.0)
MONOS PCT: 10 %
Neutro Abs: 5.1 10*3/uL (ref 1.7–7.7)
Neutrophils Relative %: 66 %
PLATELETS: 230 10*3/uL (ref 150–400)
RBC: 4.14 MIL/uL (ref 3.87–5.11)
RDW: 13.5 % (ref 11.5–15.5)
WBC: 7.6 10*3/uL (ref 4.0–10.5)

## 2015-05-23 LAB — T4, FREE: FREE T4: 0.97 ng/dL (ref 0.61–1.12)

## 2015-05-23 LAB — SALICYLATE LEVEL: Salicylate Lvl: <4 mg/dL (ref 2.8–30.0)

## 2015-05-23 LAB — ACETAMINOPHEN LEVEL

## 2015-05-23 LAB — ETHANOL

## 2015-05-23 LAB — TROPONIN I: Troponin I: <0.03 ng/mL (ref <0.031)

## 2015-05-23 LAB — VITAMIN B12: VITAMIN B 12: 607 pg/mL (ref 180–914)

## 2015-05-23 LAB — TSH: TSH: 9.273 u[IU]/mL — ABNORMAL HIGH (ref 0.350–4.500)

## 2015-05-23 LAB — CBG MONITORING, ED: GLUCOSE-CAPILLARY: 126 mg/dL — AB (ref 65–99)

## 2015-05-23 MED ORDER — LORAZEPAM 0.5 MG PO TABS: 0.5000 mg | ORAL_TABLET | Freq: Every day | ORAL | Status: DC | PRN

## 2015-05-23 MED ORDER — INSULIN ASPART 100 UNIT/ML ~~LOC~~ SOLN: 0.0000 [IU] | Freq: Every day | SUBCUTANEOUS | Status: DC

## 2015-05-23 MED ORDER — SIMVASTATIN 20 MG PO TABS
40.0000 mg | ORAL_TABLET | Freq: Every day | ORAL | Status: DC
  Administered 2015-05-23: 40 mg via ORAL
  Filled 2015-05-23: qty 2

## 2015-05-23 MED ORDER — METOPROLOL SUCCINATE ER 25 MG PO TB24
25.0000 mg | ORAL_TABLET | Freq: Every day | ORAL | Status: DC
  Administered 2015-05-24: 25 mg via ORAL

## 2015-05-23 MED ORDER — HYDRALAZINE HCL 20 MG/ML IJ SOLN: 5.0000 mg | Freq: Four times a day (QID) | INTRAMUSCULAR | Status: DC | PRN

## 2015-05-23 MED ORDER — OMEGA-3-ACID ETHYL ESTERS 1 G PO CAPS
1.0000 g | ORAL_CAPSULE | Freq: Every morning | ORAL | Status: DC
  Administered 2015-05-24: 1 g via ORAL

## 2015-05-23 MED ORDER — WARFARIN - PHARMACIST DOSING INPATIENT: Status: DC

## 2015-05-23 MED ORDER — SODIUM CHLORIDE 0.9 % IV SOLN
INTRAVENOUS | Status: DC
  Administered 2015-05-23: 09:00:00 via INTRAVENOUS

## 2015-05-23 MED ORDER — POTASSIUM CHLORIDE CRYS ER 20 MEQ PO TBCR
20.0000 meq | EXTENDED_RELEASE_TABLET | Freq: Every morning | ORAL | Status: DC
  Administered 2015-05-24: 20 meq via ORAL

## 2015-05-23 MED ORDER — IPRATROPIUM-ALBUTEROL 0.5-2.5 (3) MG/3ML IN SOLN: 3.0000 mL | RESPIRATORY_TRACT | Status: DC | PRN

## 2015-05-23 MED ORDER — FENOFIBRATE 160 MG PO TABS
160.0000 mg | ORAL_TABLET | Freq: Every day | ORAL | Status: DC
  Administered 2015-05-24: 160 mg via ORAL

## 2015-05-23 MED ORDER — ALUM & MAG HYDROXIDE-SIMETH 200-200-20 MG/5ML PO SUSP: 30.0000 mL | Freq: Four times a day (QID) | ORAL | Status: DC | PRN

## 2015-05-23 MED ORDER — ONDANSETRON HCL 4 MG PO TABS: 4.0000 mg | ORAL_TABLET | Freq: Four times a day (QID) | ORAL | Status: DC | PRN

## 2015-05-23 MED ORDER — OXYBUTYNIN CHLORIDE 5 MG PO TABS
5.0000 mg | ORAL_TABLET | Freq: Three times a day (TID) | ORAL | Status: DC
  Administered 2015-05-23 – 2015-05-24 (×3): 5 mg via ORAL
  Filled 2015-05-23 (×2): qty 1

## 2015-05-23 MED ORDER — ONDANSETRON HCL 4 MG/2ML IJ SOLN: 4.0000 mg | Freq: Four times a day (QID) | INTRAMUSCULAR | Status: DC | PRN

## 2015-05-23 MED ORDER — LEVOTHYROXINE SODIUM 88 MCG PO TABS
88.0000 ug | ORAL_TABLET | Freq: Every day | ORAL | Status: DC
  Administered 2015-05-24: 88 ug via ORAL
  Filled 2015-05-23: qty 1

## 2015-05-23 MED ORDER — SENNA 8.6 MG PO TABS
1.0000 | ORAL_TABLET | Freq: Two times a day (BID) | ORAL | Status: DC
  Administered 2015-05-23 – 2015-05-24 (×2): 8.6 mg via ORAL
  Filled 2015-05-23: qty 1

## 2015-05-23 MED ORDER — POTASSIUM CHLORIDE IN NACL 20-0.9 MEQ/L-% IV SOLN
INTRAVENOUS | Status: AC
  Administered 2015-05-23 – 2015-05-24 (×2): via INTRAVENOUS

## 2015-05-23 MED ORDER — INSULIN ASPART 100 UNIT/ML ~~LOC~~ SOLN
0.0000 [IU] | Freq: Three times a day (TID) | SUBCUTANEOUS | Status: DC
  Administered 2015-05-23: 3 [IU] via SUBCUTANEOUS
  Administered 2015-05-24: 1 [IU] via SUBCUTANEOUS
  Administered 2015-05-24: 2 [IU] via SUBCUTANEOUS

## 2015-05-23 MED ORDER — GABAPENTIN 300 MG PO CAPS
300.0000 mg | ORAL_CAPSULE | Freq: Two times a day (BID) | ORAL | Status: DC
  Administered 2015-05-23 – 2015-05-24 (×2): 300 mg via ORAL
  Filled 2015-05-23: qty 1

## 2015-05-23 MED ORDER — ENOXAPARIN SODIUM 40 MG/0.4ML ~~LOC~~ SOLN: 40.0000 mg | SUBCUTANEOUS | Status: DC

## 2015-05-23 MED ORDER — LOSARTAN POTASSIUM 50 MG PO TABS
25.0000 mg | ORAL_TABLET | Freq: Every day | ORAL | Status: DC
  Administered 2015-05-24: 25 mg via ORAL

## 2015-05-23 MED ORDER — VITAMIN D 1000 UNITS PO TABS
5000.0000 [IU] | ORAL_TABLET | Freq: Every day | ORAL | Status: DC
Start: 2015-05-23 — End: 2015-05-24
  Administered 2015-05-24: 5000 [IU] via ORAL

## 2015-05-23 MED ORDER — ACETAMINOPHEN 325 MG PO TABS: 650.0000 mg | ORAL_TABLET | Freq: Four times a day (QID) | ORAL | Status: DC | PRN

## 2015-05-23 MED ORDER — FUROSEMIDE 20 MG PO TABS
20.0000 mg | ORAL_TABLET | Freq: Every day | ORAL | Status: DC
  Administered 2015-05-24: 20 mg via ORAL

## 2015-05-23 MED ORDER — PANTOPRAZOLE SODIUM 40 MG PO TBEC
40.0000 mg | DELAYED_RELEASE_TABLET | Freq: Every day | ORAL | Status: DC
  Administered 2015-05-24: 40 mg via ORAL

## 2015-05-23 MED ORDER — GUAIFENESIN-DM 100-10 MG/5ML PO SYRP: 5.0000 mL | ORAL_SOLUTION | ORAL | Status: DC | PRN

## 2015-05-23 MED ORDER — ACETAMINOPHEN 650 MG RE SUPP: 650.0000 mg | Freq: Four times a day (QID) | RECTAL | Status: DC | PRN

## 2015-05-23 NOTE — ED Notes (Signed)
Pt had Ativan bottle in purse which husband removed.  Filled on 12/5 and 19 pills missing.  Tizanidine bottle also present with 17/30 pills present.  Filled on 11/2.  No Tramadol bottle present.

## 2015-05-23 NOTE — Progress Notes (Signed)
ANTICOAGULATION CONSULT NOTE - Initial Consult  Pharmacy Consult for Coumadin (chronic Rx PTA) Indication: pulmonary embolus  Allergies  Allergen Reactions  . Procaine Hcl Anaphylaxis  . Codeine     "knocks me out" - states it is too strong  . Latex Hives and Rash  . Penicillins Diarrhea    Has patient had a PCN reaction causing immediate rash, facial/tongue/throat swelling, SOB or lightheadedness with hypotension: No Has patient had a PCN reaction causing severe rash involving mucus membranes or skin necrosis: No Has patient had a PCN reaction that required hospitalization No Has patient had a PCN reaction occurring within the last 10 years: No If all of the above answers are "NO", then may proceed with Cephalosporin use.     Patient Measurements: Height:  (157.5 cm) Weight: 187 lb 3.2 oz (84.913 kg) IBW/kg (Calculated) : 50.1  Vital Signs: Temp: 97.7 F (36.5 C) (12/13 1034) Temp Source: Oral (12/13 1034) BP: 147/58 mmHg (12/13 1034) Pulse Rate: 51 (12/13 1034)  Labs:  Recent Labs  05/23/15 0757  HGB 12.4  HCT 37.9  PLT 230  LABPROT 33.7*  INR 3.42*  CREATININE 1.35*  TROPONINI <0.03    Estimated Creatinine Clearance: 40.9 mL/min (by C-G formula based on Cr of 1.35).   Medical History: Past Medical History  Diagnosis Date  . Hypertension   . Diabetes mellitus   . Arthritis   . Blood transfusion 2007    History of  . PE (pulmonary embolism) February 2007; March 2013    Bilateral PEs in 07; massive PE March 2013 with moderate elevation in PA pressures.  . Anemia   . COPD (chronic obstructive pulmonary disease) with emphysema (HCC)   . Depression   . Anxiety   . Gastroesophageal reflux disease   . Osteoporosis   . DDD (degenerative disc disease)   . Uterine prolapse   . Asthma   . History of CHF (congestive heart failure) 2007    2013: Normal EF by echo x2 with no significant diastolic dysfunction noted either  . Heart murmur     No  significant valvular lesions on echo  . Hyperlipidemia   . Hypothyroidism (acquired)     On Levothyroxine  . Seizures (HCC)     once  . Headache(784.0)   . Heart palpitations   . Chronic kidney disease     lt kidney limited fxn  . Myocardial infarction (HCC)   . Chronic pain   . Anxiety and depression, ( ? early dementia) 08/14/2011    Medications:  Prescriptions prior to admission  Medication Sig Dispense Refill Last Dose  . amitriptyline (ELAVIL) 25 MG tablet Take 1 tablet by mouth daily.   unknown  . Cholecalciferol (VITAMIN D3) 5000 UNITS CAPS Take 5,000 Units by mouth daily.   05/22/2015 at Unknown time  . Choline Fenofibrate (FENOFIBRIC ACID) 135 MG CPDR Take 1 capsule by mouth daily. 30 capsule 5 05/22/2015 at Unknown time  . DULoxetine (CYMBALTA) 30 MG capsule Take 1 capsule (30 mg total) by mouth daily. 30 capsule 3 05/22/2015 at Unknown time  . fish oil-omega-3 fatty acids 1000 MG capsule Take 1 g by mouth every morning.    05/22/2015 at Unknown time  . furosemide (LASIX) 40 MG tablet Take 0.5 tablets (20 mg total) by mouth daily. 15 tablet 5 05/22/2015 at Unknown time  . gabapentin (NEURONTIN) 300 MG capsule 1 po BID (Patient taking differently: Take 300 mg by mouth 2 (two) times daily. ) 60 capsule  5 05/22/2015 at Unknown time  . glimepiride (AMARYL) 2 MG tablet Take 1 tablet (2 mg total) by mouth daily before breakfast. 30 tablet 5 05/22/2015 at Unknown time  . ipratropium-albuterol (DUONEB) 0.5-2.5 (3) MG/3ML SOLN Take 3 mLs by nebulization every 4 (four) hours as needed. For shortness of breath 360 mL 5 unknown  . levothyroxine (SYNTHROID, LEVOTHROID) 88 MCG tablet TAKE (1) TABLET ONCE DAILY IN THE MORNING BEFORE BREAKFAST. 30 tablet 5 05/22/2015 at Unknown time  . LORazepam (ATIVAN) 1 MG tablet Take 1 tablet (1 mg total) by mouth daily as needed for anxiety. 30 tablet 1 05/23/2015 at Unknown time  . losartan (COZAAR) 25 MG tablet Take 1 tablet (25 mg total) by mouth daily.  30 tablet 5 05/22/2015 at Unknown time  . meclizine (ANTIVERT) 50 MG tablet Take 50 mg by mouth daily as needed for dizziness.    unknown  . metFORMIN (GLUCOPHAGE) 500 MG tablet Take 1 tablet (500 mg total) by mouth 2 (two) times daily with a meal. 60 tablet 5 05/22/2015 at Unknown time  . metoprolol succinate (TOPROL-XL) 25 MG 24 hr tablet TAKE (1) TABLET BY MOUTH ONCE DAILY. 30 tablet 5 05/22/2015 at Unknown time  . oxybutynin (DITROPAN) 5 MG tablet Take 1 tablet (5 mg total) by mouth 3 (three) times daily. 90 tablet 2 05/22/2015 at Unknown time  . pantoprazole (PROTONIX) 20 MG tablet Take 1 tablet (20 mg total) by mouth 2 (two) times daily. 60 tablet 4 05/22/2015 at Unknown time  . potassium chloride SA (K-DUR,KLOR-CON) 20 MEQ tablet Take 1 tablet (20 mEq total) by mouth every morning. 30 tablet 5 05/22/2015 at Unknown time  . PROAIR HFA 108 (90 BASE) MCG/ACT inhaler INHALE 2 PUFFS INTO THE LUNGS EVERY 6 HOURS AS NEEDED FOR SHORTNESS OF BREATH. 8.5 g 1 unknown  . simvastatin (ZOCOR) 40 MG tablet Take 1 tablet (40 mg total) by mouth at bedtime. 30 tablet 5 05/22/2015 at Unknown time  . traMADol (ULTRAM) 50 MG tablet Take 50 mg by mouth every 6 (six) hours as needed for moderate pain.   05/23/2015 at Unknown time  . warfarin (COUMADIN) 5 MG tablet Take 1/2 pill daily, take 1 whole pill on Monday and friday 30 tablet 5 05/22/2015 at 1800  . diazepam (VALIUM) 10 MG tablet Take 1 tablet by mouth.     Marland Kitchen. glucose blood (ACCU-CHEK AVIVA) test strip Use to check blood glucose up to bid. Dx: E11.9 type 2 diabetes 100 each 2 Taking  . Lancets (ACCU-CHEK SOFT TOUCH) lancets Use to check BG up to bid. Dx: E11.9 - type 2 DM 100 each 2 Taking  . tiZANidine (ZANAFLEX) 4 MG tablet Take 1 tablet (4 mg total) by mouth every 6 (six) hours as needed for muscle spasms. 30 tablet 2 Taking   Assessment: 69yo female with h/o PE on chronic Coumadin.  INR is SUPRAtherapeutic on admission.  Goal of Therapy:  INR  2-3 Monitor platelets by anticoagulation protocol: Yes   Plan:  HOLD coumadin today due to elevated INR INR daily  Valrie HartHall, Selam Pietsch A 05/23/2015,12:58 PM

## 2015-05-23 NOTE — H&P (Signed)
[facility] History and Physical  DEDRIA ENDRES [name]:[id number] DOB: Jul 01, 1945 DOA: 05/23/2015  Referring physician: ED physician, Dr. Clarene Duke PCP: Kevin Fenton, MD   Chief Complaint: Confusion at home.  HPI: [name] is a 69 y.o. female with a history of depression with anxiety, hypertension, chronic kidney disease, diabetes mellitus, and history of pulmonary embolism-on warfarin, who presents to the ED with a report of confusion at home. Family is currently not available. The patient does appear confused and is unable to provide a reliable history. The history is being gathered from the EDP and the ED notes and review of recent PCPs note. The family brought the patient to the ED because of confusion this morning. According to her husband, she got up this morning "fussing around" and told him that she was going to make him breakfast, but she pulled out the trash can instead. She told him that she "took her meds" this morning which was not her usual routine. The patient is unsure if she took more doses of her medications than intended. Her husband stated that there were recent medication changes by her PCP including Valium discontinued and Ativan started; Cymbalta started; and prescription given for Ultram. There has been no reported fever, chills, focal weakness, falls, abdominal pain, nausea, vomiting, diarrhea, or pain with urination. Currently, the patient has no complaints. She denies suicidal ideation or severe depression at this time.  In the ED, the patient is afebrile and hemodynamically stable, though bradycardic with a heart rate of 47-51. She is oxygenating 98% on room air. Her chest x-ray reveals no acute disease. Her head CT reveals no acute abnormalities, but with stable chronic white matter disease. Her EKG reveals sinus bradycardia with PACs. Her lab data are significant for a BUN of 32, creatinine of 1.35, normal troponin I, INR of 3.42, normal urinalysis, and urine  drug screen positive for benzodiazepines. She is being admitted for further evaluation and management.   Review of Systems:  As above in history present illness. Patient does report occasional abdominal pain, occasional shortness of breath, occasional chest pain, chronic pain in her legs and her back, confusion; she denies trying to hurt herself; otherwise review of systems is positive.   Past Medical History  Diagnosis Date  . Hypertension   . Diabetes mellitus   . Arthritis   . Blood transfusion 2007    History of  . PE (pulmonary embolism) February 2007; March 2013    Bilateral PEs in 07; massive PE March 2013 with moderate elevation in PA pressures.  . Anemia   . COPD (chronic obstructive pulmonary disease) with emphysema (HCC)   . Depression   . Anxiety   . Gastroesophageal reflux disease   . Osteoporosis   . DDD (degenerative disc disease)   . Uterine prolapse   . Asthma   . History of CHF (congestive heart failure) 2007    2013: Normal EF by echo x2 with no significant diastolic dysfunction noted either  . Heart murmur     No significant valvular lesions on echo  . Hyperlipidemia   . Hypothyroidism (acquired)     On Levothyroxine  . Seizures (HCC)     once  . Headache(784.0)   . Heart palpitations   . Chronic kidney disease     lt kidney limited fxn  . Myocardial infarction (HCC)   . Chronic pain   . Anxiety and depression, ( ? early dementia) 08/14/2011   Past Surgical History  Procedure Laterality Date  .  Hiatel hernia repair    . Tubal ligation    . Cardiac catheterization  March 2013    No evidence of coronary disease; also no significant aortic or iliac or renal artery disease.  . Left heart catheterization with coronary angiogram N/A 08/16/2011    Procedure: LEFT HEART CATHETERIZATION WITH CORONARY ANGIOGRAM;  Surgeon: Marykay Lexavid W Harding, MD;  [facility];  Service: Cardiovascular;  Laterality: N/A;   Social History: she is married. She has 6  children. She quit smoking about 14 years ago. Her smoking use included Cigarettes. She smoked 2.00 packs per day. She has never used smokeless tobacco. She reports that she does not drink alcohol or use illicit drugs.  Allergies  Allergen Reactions  . Procaine Hcl Anaphylaxis  . Codeine     "knocks me out" - states it is too strong  . Latex Hives and Rash  . Penicillins Diarrhea    Has patient had a PCN reaction causing immediate rash, facial/tongue/throat swelling, SOB or lightheadedness with hypotension: No Has patient had a PCN reaction causing severe rash involving mucus membranes or skin necrosis: No Has patient had a PCN reaction that required hospitalization No Has patient had a PCN reaction occurring within the last 10 years: No If all of the above answers are "NO", then may proceed with Cephalosporin use.     Family History  Problem Relation Age of Onset  . Heart disease Mother   . Stroke Mother   . Prostate cancer Father   . Cancer Father     bone  . Liver cancer Brother   . Alzheimer's disease      family history  . Cancer Daughter     breast  . Cancer Paternal Uncle     unsure of what kind  . COPD Sister   . Heart disease Sister   . Goiter Sister   . Early death Brother 8  . Cancer Brother     leukemia    Prior to Admission medications   Medication Sig Start Date End Date Taking? Authorizing Provider  amitriptyline (ELAVIL) 25 MG tablet Take 1 tablet by mouth daily. 05/10/15  Yes Historical Provider, MD  Cholecalciferol (VITAMIN D3) 5000 UNITS CAPS Take 5,000 Units by mouth daily.   Yes Historical Provider, MD  Choline Fenofibrate (FENOFIBRIC ACID) 135 MG CPDR Take 1 capsule by mouth daily. 11/09/14  Yes Mary-Margaret Daphine DeutscherMartin, FNP  DULoxetine (CYMBALTA) 30 MG capsule Take 1 capsule (30 mg total) by mouth daily. 05/15/15  Yes Elenora GammaSamuel L Bradshaw, MD  fish oil-omega-3 fatty acids 1000 MG capsule Take 1 g by mouth every morning.    Yes Historical Provider, MD    furosemide (LASIX) 40 MG tablet Take 0.5 tablets (20 mg total) by mouth daily. 11/09/14  Yes Mary-Margaret Daphine DeutscherMartin, FNP  gabapentin (NEURONTIN) 300 MG capsule 1 po BID Patient taking differently: Take 300 mg by mouth 2 (two) times daily.  11/09/14  Yes Mary-Margaret Daphine DeutscherMartin, FNP  glimepiride (AMARYL) 2 MG tablet Take 1 tablet (2 mg total) by mouth daily before breakfast. 02/09/15  Yes Mary-Margaret Daphine DeutscherMartin, FNP  ipratropium-albuterol (DUONEB) 0.5-2.5 (3) MG/3ML SOLN Take 3 mLs by nebulization every 4 (four) hours as needed. For shortness of breath 06/16/14  Yes Mary-Margaret Daphine DeutscherMartin, FNP  levothyroxine (SYNTHROID, LEVOTHROID) 88 MCG tablet TAKE (1) TABLET ONCE DAILY IN THE MORNING BEFORE BREAKFAST. 11/09/14  Yes Mary-Margaret Daphine DeutscherMartin, FNP  LORazepam (ATIVAN) 1 MG tablet Take 1 tablet (1 mg total) by mouth daily as needed for  anxiety. 05/15/15  Yes Elenora Gamma, MD  losartan (COZAAR) 25 MG tablet Take 1 tablet (25 mg total) by mouth daily. 11/09/14  Yes Mary-Margaret Daphine Deutscher, FNP  meclizine (ANTIVERT) 50 MG tablet Take 50 mg by mouth daily as needed for dizziness.    Yes Historical Provider, MD  metFORMIN (GLUCOPHAGE) 500 MG tablet Take 1 tablet (500 mg total) by mouth 2 (two) times daily with a meal. 02/21/15  Yes Mary-Margaret Daphine Deutscher, FNP  metoprolol succinate (TOPROL-XL) 25 MG 24 hr tablet TAKE (1) TABLET BY MOUTH ONCE DAILY. 11/09/14  Yes Mary-Margaret Daphine Deutscher, FNP  oxybutynin (DITROPAN) 5 MG tablet Take 1 tablet (5 mg total) by mouth 3 (three) times daily. 04/04/15  Yes Elenora Gamma, MD  pantoprazole (PROTONIX) 20 MG tablet Take 1 tablet (20 mg total) by mouth 2 (two) times daily. 05/05/15  Yes Ernestina Penna, MD  potassium chloride SA (K-DUR,KLOR-CON) 20 MEQ tablet Take 1 tablet (20 mEq total) by mouth every morning. 11/09/14  Yes Mary-Margaret Daphine Deutscher, FNP  PROAIR HFA 108 (90 BASE) MCG/ACT inhaler INHALE 2 PUFFS INTO THE LUNGS EVERY 6 HOURS AS NEEDED FOR SHORTNESS OF BREATH. 06/06/14  Yes Mary-Margaret Daphine Deutscher,  FNP  simvastatin (ZOCOR) 40 MG tablet Take 1 tablet (40 mg total) by mouth at bedtime. 11/09/14  Yes Mary-Margaret Daphine Deutscher, FNP  traMADol (ULTRAM) 50 MG tablet Take 50 mg by mouth every 6 (six) hours as needed for moderate pain.   Yes Historical Provider, MD  warfarin (COUMADIN) 5 MG tablet Take 1/2 pill daily, take 1 whole pill on Monday and friday 05/01/15  Yes Elenora Gamma, MD  diazepam (VALIUM) 10 MG tablet Take 1 tablet by mouth. 04/20/15   Historical Provider, MD  glucose blood (ACCU-CHEK AVIVA) test strip Use to check blood glucose up to bid. Dx: E11.9 type 2 diabetes 11/03/14   Ernestina Penna, MD  Lancets (ACCU-CHEK SOFT [facility]) lancets Use to check BG up to bid. Dx: E11.9 - type 2 DM 11/03/14   Ernestina Penna, MD  tiZANidine (ZANAFLEX) 4 MG tablet Take 1 tablet (4 mg total) by mouth every 6 (six) hours as needed for muscle spasms. 04/12/15   Elenora Gamma, MD   Physical Exam: Filed Vitals:   05/23/15 0800 05/23/15 0930 05/23/15 1000 05/23/15 1034  BP: 136/58 142/75 144/64 147/58  Pulse: 47 50 49 51  Temp:    97.7 F (36.5 C)  TempSrc:    Oral  Resp: 17 20 21 20   Height:    5\' 2"  (1.575 m)  Weight:    84.913 kg (187 lb 3.2 oz)  SpO2: 92% 92% 95% 98%    Wt Readings from Last 3 Encounters:  05/23/15 84.913 kg (187 lb 3.2 oz)  05/15/15 86.546 kg (190 lb 12.8 oz)  04/04/15 85.458 kg (188 lb 6.4 oz)    General:  Appears calm and comfortable; mildly obese 69 year old who is alert and in no acute distress. Eyes: PERRL, normal lids, irises & conjunctiva; conjunctivae are clear and sclerae are white. ENT: grossly normal hearing; oropharynx reveals no teeth and dry oropharyngeal mucous membranes. Neck: no LAD, masses or thyromegaly. Cardiovascular: S1, S2, with occasional ectopy and a soft systolic murmur. No LE edema. Telemetry: SR, no arrhythmias  Respiratory: Breathing nonlabored. Several scattered crackles. Abdomen: Obese, positive bowel sounds, soft, nontender,  nondistended. Skin: no rash or induration seen on limited exam Musculoskeletal: grossly normal tone BUE/BLE; no acute hot red joints. Neuropsychiatric She is alert and oriented to herself  and "[facility]". She believes the president is "Sports administrator". She does not know what holiday occurs this month. Cranial nerves II through XII are grossly intact. Bilateral handgrip is 5 over 5. She is able to raise each leg against gravity and at least 45.           Labs on Admission:  Basic Metabolic Panel:  Recent Labs Lab 05/23/15 0757  NA 138  K 3.8  CL 98*  CO2 30  GLUCOSE 136*  BUN 32*  CREATININE 1.35*  CALCIUM 9.7   Liver Function Tests:  Recent Labs Lab 05/23/15 0757  AST 28  ALT 22  ALKPHOS 38  BILITOT 0.4  PROT 7.5  ALBUMIN 3.9   No results for input(s): LIPASE, AMYLASE in the last 168 hours. No results for input(s): AMMONIA in the last 168 hours. CBC:  Recent Labs Lab 05/23/15 0757  WBC 7.6  NEUTROABS 5.1  HGB 12.4  HCT 37.9  MCV 91.5  PLT 230   Cardiac Enzymes:  Recent Labs Lab 05/23/15 0757  TROPONINI <0.03    BNP (last 3 results) No results for input(s): BNP in the last 8760 hours.  ProBNP (last 3 results) No results for input(s): PROBNP in the last 8760 hours.  CBG:  Recent Labs Lab 05/23/15 0802  GLUCAP 126*    Radiological Exams on Admission: Dg Chest 2 View  05/23/2015  CLINICAL DATA:  Confusion EXAM: CHEST  2 VIEW COMPARISON:  02/19/2013 FINDINGS: Cardiomediastinal silhouette is stable. Stable atelectasis or scarring left base. Calcified granuloma in right midlung is stable. No acute infiltrate or pleural effusion. No pulmonary edema. Mild degenerative changes and osteopenia thoracic spine. IMPRESSION: No active cardiopulmonary disease. Mild degenerative changes and osteopenia thoracic spine. Electronically Signed   By: Natasha Mead M.D.   On: 05/23/2015 08:49   Ct Head Wo Contrast  05/23/2015  CLINICAL DATA:  Confusion EXAM: CT  HEAD WITHOUT CONTRAST TECHNIQUE: Contiguous axial images were obtained from the base of the skull through the vertex without intravenous contrast. COMPARISON:  09/18/2011. FINDINGS: No skull fracture is noted. Paranasal sinuses and mastoid air cells are unremarkable. No intracranial hemorrhage, mass effect or midline shift. No acute cortical infarction. No mass lesion is noted on this unenhanced scan. Mild periventricular white matter decreased attenuation probable from chronic small vessel ischemic changes. Mild cerebral atrophy is stable. Small bilateral basal ganglia calcifications are again noted. IMPRESSION: No acute intracranial abnormality. Stable atrophy and mild periventricular chronic white matter disease. Electronically Signed   By: Natasha Mead M.D.   On: 05/23/2015 08:36    EKG: Independently reviewed.   Assessment/Plan Principal Problem:   Acute encephalopathy Active Problems:   Altered mental status   Acute kidney injury (HCC)   Essential hypertension, benign   Hx pulmonary embolism   COPD mixed type (HCC)   Anxiety and depression, ( ? early dementia)   Chronic anticoagulation, pt will need lifelong anticoagulation   Obesity (BMI 30-39.9)   Type 2 diabetes with nephropathy (HCC)   1. Acute encephalopathy. The etiology is unclear, but could be secondary to medication side effects, mild volume depletion, depression with anxiety, and possible progressing dementia. She does not appear to have any cranial nerve deficits and does not appear to have had an acute CVA. One of her diagnosis a few years ago was possible dementia and I believe this is a real possibility. She is treated with several psychotropic medications which could certainly cause confusion. Recently, her PCP Dr. Ermalinda Memos stopped  Valium and started Ativan; stopped amitriptyline and added Cymbalta; stopped tramadol and recommended Tylenol for pain. It is unclear if the patient had taken both Ativan and Valium or she was still  taking amitriptyline. Also noted on her medication list is Zanaflex which can also cause confusion. Per the registered nurse in the ED, Ativan was filled on 12/5 and there are 19 pills missing; Zanaflex bottle had 17 of 30 tablets which was filled on 04/12/15. There was no tramadol bottle present. For evaluation and management, will start gentle IV fluids as she appears volume depleted and hold Lasix until tomorrow; discontinue Valium and decrease the dose of lorazepam to 0.5 mg; discontinue amitriptyline; discontinue Zanaflex; order TSH, RPR, vitamin B12 level. Monitor on telemetry. 2. Mild acute kidney injury superimposed on stage I to stage II chronic kidney disease. Her baseline creatinine ranges from 1.03-1.19. It was 1.35 on admission. She is treated chronically with Lasix and losartan. Both will be held until tomorrow and she will be started on gentle IV fluids. Will follow her renal function. 3. History of pulmonary embolism. The patient is treated chronically with warfarin. Her INR is slightly supratherapeutic at 3.42. Will ask pharmacy to dose the Coumadin and follow her INR. 4. Hypertension. The patient is treated with Toprol-XL and losartan. Her blood pressure modestly elevated. We'll continue Toprol with parameters to hold it for heart rate of less than 55. Will hold losartan until tomorrow morning. We'll at when necessary hydralazine for accelerated blood pressures. 5. Bradycardia. Patient's heart rate has ranged from 47-51. This is likely from Toprol-XL. Parameters were ordered. She also has hypothyroidism, so will order a TSH and free T4. 6. Diabetes mellitus with mild nephropathy. The patient is treated chronically with metformin and glipizide. This will be held in the hospital in favor of treatment with sliding scale NovoLog. Will order hemoglobin A1c.    Code Status: Full code DVT Prophylaxis: DVT prophylaxis Family Communication: Family not available; I called her husband, but there was  no answer. Disposition Plan: Anticipate discharge in the next 24-48 hours.  Time spent: One hour.  [facility] Pager [phone]

## 2015-05-23 NOTE — ED Provider Notes (Signed)
CSN: [id number]     Arrival date & time 05/23/15  [id number] History   First MD Initiated Contact with Patient 05/23/15 [phone]     Chief Complaint  Patient presents with  . Altered Mental Status      Patient is a 69 y.o. female presenting with altered mental status. The history is provided by the patient, a relative and the spouse. The history is limited by the condition of the patient (confusion).  Altered Mental Status Pt was seen at 0740. Per pt's spouse: States he found pt this morning "confused."  States pt was "up before me" and "fussing around" which is not her norm.  States pt told him she was "going to make him breakfast" and pulled out the trash can. Pt also told spouse she "took her meds" this morning, which was not her usual time of day to take them. Pt denies taking meds to excess/extra doses. Pt's husband states pt had med changes during her last PMD visit on 05/15/15 (valium changed to ativan, started cymbalta, rx given for ultram). No reported fever, no fall, no focal motor weakness, no vomiting/diarrhea, no CP/SOB.   Past Medical History  Diagnosis Date  . Hypertension   . Diabetes mellitus   . Arthritis   . Blood transfusion 2007    History of  . PE (pulmonary embolism) February 2007; March 2013    Bilateral PEs in 07; massive PE March 2013 with moderate elevation in PA pressures.  . Anemia   . COPD (chronic obstructive pulmonary disease) with emphysema (HCC)   . Depression   . Anxiety   . Gastroesophageal reflux disease   . Osteoporosis   . DDD (degenerative disc disease)   . Uterine prolapse   . Asthma   . History of CHF (congestive heart failure) 2007    2013: Normal EF by echo x2 with no significant diastolic dysfunction noted either  . Heart murmur     No significant valvular lesions on echo  . Hyperlipidemia   . Hypothyroidism (acquired)     On Levothyroxine  . Seizures (HCC)     once  . Headache(784.0)   . Heart palpitations   . Chronic kidney disease      lt kidney limited fxn  . Myocardial infarction (HCC)   . Chronic pain    Past Surgical History  Procedure Laterality Date  . Hiatel hernia repair    . Tubal ligation    . Cardiac catheterization  March 2013    No evidence of coronary disease; also no significant aortic or iliac or renal artery disease.  . Left heart catheterization with coronary angiogram N/A 08/16/2011    Procedure: LEFT HEART CATHETERIZATION WITH CORONARY ANGIOGRAM;  Surgeon: Marykay Lexavid W Harding, MD;  [facility];  Service: Cardiovascular;  Laterality: N/A;   Family History  Problem Relation Age of Onset  . Heart disease Mother   . Stroke Mother   . Prostate cancer Father   . Cancer Father     bone  . Liver cancer Brother   . Alzheimer's disease      family history  . Cancer Daughter     breast  . Cancer Paternal Uncle     unsure of what kind  . COPD Sister   . Heart disease Sister   . Goiter Sister   . Early death Brother 8  . Cancer Brother     leukemia   Social History  Substance Use Topics  . Smoking status: Former  Smoker -- 2.00 packs/day    Types: Cigarettes    Quit date: 03/23/2001  . Smokeless tobacco: Never Used  . Alcohol Use: No    Review of Systems  Unable to perform ROS: Mental status change      Allergies  Procaine hcl; Codeine; Latex; and Penicillins  Home Medications   Prior to Admission medications   Medication Sig Start Date End Date Taking? Authorizing Provider  Cholecalciferol (VITAMIN D3) 5000 UNITS CAPS Take 5,000 Units by mouth daily.    Historical Provider, MD  Choline Fenofibrate (FENOFIBRIC ACID) 135 MG CPDR Take 1 capsule by mouth daily. 11/09/14   Mary-Margaret Daphine Deutscher, FNP  DULoxetine (CYMBALTA) 30 MG capsule Take 1 capsule (30 mg total) by mouth daily. 05/15/15   Elenora Gamma, MD  fish oil-omega-3 fatty acids 1000 MG capsule Take 1 g by mouth every morning.     Historical Provider, MD  furosemide (LASIX) 40 MG tablet Take 0.5 tablets (20 mg total) by  mouth daily. 11/09/14   Mary-Margaret Daphine Deutscher, FNP  gabapentin (NEURONTIN) 300 MG capsule 1 po BID Patient taking differently: Take 300 mg by mouth 2 (two) times daily.  11/09/14   Mary-Margaret Daphine Deutscher, FNP  glimepiride (AMARYL) 2 MG tablet Take 1 tablet (2 mg total) by mouth daily before breakfast. 02/09/15   Mary-Margaret Daphine Deutscher, FNP  glucose blood (ACCU-CHEK AVIVA) test strip Use to check blood glucose up to bid. Dx: E11.9 type 2 diabetes 11/03/14   Ernestina Penna, MD  ipratropium-albuterol (DUONEB) 0.5-2.5 (3) MG/3ML SOLN Take 3 mLs by nebulization every 4 (four) hours as needed. For shortness of breath 06/16/14   Mary-Margaret Daphine Deutscher, FNP  Lancets (ACCU-CHEK SOFT TOUCH) lancets Use to check BG up to bid. Dx: E11.9 - type 2 DM 11/03/14   Ernestina Penna, MD  levothyroxine (SYNTHROID, LEVOTHROID) 88 MCG tablet TAKE (1) TABLET ONCE DAILY IN THE MORNING BEFORE BREAKFAST. 11/09/14   Mary-Margaret Daphine Deutscher, FNP  LORazepam (ATIVAN) 1 MG tablet Take 1 tablet (1 mg total) by mouth daily as needed for anxiety. 05/15/15   Elenora Gamma, MD  losartan (COZAAR) 25 MG tablet Take 1 tablet (25 mg total) by mouth daily. 11/09/14   Mary-Margaret Daphine Deutscher, FNP  meclizine (ANTIVERT) 50 MG tablet Take 50 mg by mouth daily.     Historical Provider, MD  metFORMIN (GLUCOPHAGE) 500 MG tablet Take 1 tablet (500 mg total) by mouth 2 (two) times daily with a meal. 02/21/15   Mary-Margaret Daphine Deutscher, FNP  metoprolol succinate (TOPROL-XL) 25 MG 24 hr tablet TAKE (1) TABLET BY MOUTH ONCE DAILY. 11/09/14   Mary-Margaret Daphine Deutscher, FNP  nystatin cream (MYCOSTATIN) Apply 1 application topically 2 (two) times daily. Patient not taking: Reported on 05/15/2015 02/09/15   Mary-Margaret Daphine Deutscher, FNP  OVER THE COUNTER MEDICATION Take 1-3 tablets by mouth daily. Weight Loss Formula with Apple Cider Vinegar, Kelp, Lecithin, and B-6    Historical Provider, MD  oxybutynin (DITROPAN) 5 MG tablet Take 1 tablet (5 mg total) by mouth 3 (three) times daily. 04/04/15    Elenora Gamma, MD  pantoprazole (PROTONIX) 20 MG tablet Take 1 tablet (20 mg total) by mouth 2 (two) times daily. 05/05/15   Ernestina Penna, MD  PATADAY 0.2 % SOLN Place 1 drop into both eyes daily.  12/16/14   Historical Provider, MD  potassium chloride SA (K-DUR,KLOR-CON) 20 MEQ tablet Take 1 tablet (20 mEq total) by mouth every morning. 11/09/14   Mary-Margaret Daphine Deutscher, FNP  PROAIR HFA 108 (90 BASE)  MCG/ACT inhaler INHALE 2 PUFFS INTO THE LUNGS EVERY 6 HOURS AS NEEDED FOR SHORTNESS OF BREATH. 06/06/14   Mary-Margaret Daphine Deutscher, FNP  simvastatin (ZOCOR) 40 MG tablet Take 1 tablet (40 mg total) by mouth at bedtime. 11/09/14   Mary-Margaret Daphine Deutscher, FNP  tiZANidine (ZANAFLEX) 4 MG tablet Take 1 tablet (4 mg total) by mouth every 6 (six) hours as needed for muscle spasms. 04/12/15   Elenora Gamma, MD  warfarin (COUMADIN) 5 MG tablet Take 1/2 pill daily, take 1 whole pill on Monday and friday 05/01/15   Elenora Gamma, MD   BP 135/77 mmHg  Pulse 50  Temp(Src) 98 F (36.7 C) (Oral)  Resp 18  SpO2 96% Physical Exam  0745: Physical examination:  Nursing notes reviewed; Vital signs and O2 SAT reviewed;  Constitutional: Well developed, Well nourished, Well hydrated, In no acute distress; Head:  Normocephalic, atraumatic; Eyes: EOMI, PERRL, No scleral icterus; ENMT: Mouth and pharynx normal, Mucous membranes moist; Neck: Supple, Full range of motion, No lymphadenopathy; Cardiovascular: Regular rate and rhythm, No gallop; Respiratory: Breath sounds clear & equal bilaterally, No wheezes.  Speaking full sentences with ease, Normal respiratory effort/excursion; Chest: Nontender, Movement normal; Abdomen: Soft, Nontender, Nondistended, Normal bowel sounds; Genitourinary: No CVA tenderness; Extremities: Pulses normal, No tenderness, No edema, No calf edema or asymmetry.; Neuro: Awake, alert, confused re: events. Major CN grossly intact. No facial droop. Speech clear. No gross focal motor or sensory deficits in  extremities. Climbs on and off stretcher easily by herself. Gait steady.; Skin: Color normal, Warm, Dry.   ED Course  Procedures (including critical care time) Labs Review   Imaging Review  I have personally reviewed and evaluated these images and lab results as part of my medical decision-making.   EKG Interpretation   Date/Time:  Tuesday May 23 2015 07:52:10 EST Ventricular Rate:  53 PR Interval:  149 QRS Duration: 116 QT Interval:  455 QTC Calculation: 427 R Axis:   34 Text Interpretation:  Sinus bradycardia Atrial premature complex  Nonspecific intraventricular conduction delay Low voltage, extremity and  precordial leads Baseline wander When compared with ECG of 01/19/2013 Rate  slower Confirmed by [facility]  MD, Nicholos Johns [phone]) on 05/23/2015 8:01:17 AM      MDM  MDM Reviewed: previous chart, nursing note and vitals Reviewed previous: labs and ECG Interpretation: labs, ECG, x-ray and CT scan     Results for orders placed or performed during the hospital encounter of 05/23/15  Comprehensive metabolic panel  Result Value Ref Range   Sodium 138 135 - 145 mmol/L   Potassium 3.8 3.5 - 5.1 mmol/L   Chloride 98 (L) 101 - 111 mmol/L   CO2 30 22 - 32 mmol/L   Glucose, Bld 136 (H) 65 - 99 mg/dL   BUN 32 (H) 6 - 20 mg/dL   Creatinine, Ser 1.19 (H) 0.44 - 1.00 mg/dL   Calcium 9.7 8.9 - 14.7 mg/dL   Total Protein 7.5 6.5 - 8.1 g/dL   Albumin 3.9 3.5 - 5.0 g/dL   AST 28 15 - 41 U/L   ALT 22 14 - 54 U/L   Alkaline Phosphatase 38 38 - 126 U/L   Total Bilirubin 0.4 0.3 - 1.2 mg/dL   GFR calc non Af Amer 40 (L) >60 mL/min   GFR calc Af Amer 46 (L) >60 mL/min   Anion gap 10 5 - 15  Ethanol  Result Value Ref Range   Alcohol, Ethyl (B) <5 <5 mg/dL  Acetaminophen level  Result Value Ref Range   Acetaminophen (Tylenol), Serum <10 (L) 10 - 30 ug/mL  Lactic acid, plasma  Result Value Ref Range   Lactic Acid, Venous 1.3 0.5 - 2.0 mmol/L  Troponin I  Result Value Ref  Range   Troponin I <0.03 <0.031 ng/mL  Salicylate level  Result Value Ref Range   Salicylate Lvl <4.0 2.8 - 30.0 mg/dL  CBC with Differential  Result Value Ref Range   WBC 7.6 4.0 - 10.5 K/uL   RBC 4.14 3.87 - 5.11 MIL/uL   Hemoglobin 12.4 12.0 - 15.0 g/dL   HCT 81.1 91.4 - 78.2 %   MCV 91.5 78.0 - 100.0 fL   MCH 30.0 26.0 - 34.0 pg   MCHC 32.7 30.0 - 36.0 g/dL   RDW 95.6 21.3 - 08.6 %   Platelets 230 150 - 400 K/uL   Neutrophils Relative % 66 %   Neutro Abs 5.1 1.7 - 7.7 K/uL   Lymphocytes Relative 21 %   Lymphs Abs 1.6 0.7 - 4.0 K/uL   Monocytes Relative 10 %   Monocytes Absolute 0.8 0.1 - 1.0 K/uL   Eosinophils Relative 2 %   Eosinophils Absolute 0.1 0.0 - 0.7 K/uL   Basophils Relative 1 %   Basophils Absolute 0.0 0.0 - 0.1 K/uL  Protime-INR  Result Value Ref Range   Prothrombin Time 33.7 (H) 11.6 - 15.2 seconds   INR 3.42 (H) 0.00 - 1.49  Urinalysis, Routine w reflex microscopic  Result Value Ref Range   Color, Urine YELLOW YELLOW   APPearance CLEAR CLEAR   Specific Gravity, Urine 1.015 1.005 - 1.030   pH 5.5 5.0 - 8.0   Glucose, UA NEGATIVE NEGATIVE mg/dL   Hgb urine dipstick NEGATIVE NEGATIVE   Bilirubin Urine NEGATIVE NEGATIVE   Ketones, ur NEGATIVE NEGATIVE mg/dL   Protein, ur NEGATIVE NEGATIVE mg/dL   Nitrite NEGATIVE NEGATIVE   Leukocytes, UA NEGATIVE NEGATIVE  Urine rapid drug screen (hosp performed)  Result Value Ref Range   Opiates NONE DETECTED NONE DETECTED   Cocaine NONE DETECTED NONE DETECTED   Benzodiazepines POSITIVE (A) NONE DETECTED   Amphetamines NONE DETECTED NONE DETECTED   Tetrahydrocannabinol NONE DETECTED NONE DETECTED   Barbiturates NONE DETECTED NONE DETECTED  CBG monitoring, ED  Result Value Ref Range   Glucose-Capillary 126 (H) 65 - 99 mg/dL   Dg Chest 2 View [id number]  CLINICAL DATA:  Confusion EXAM: CHEST  2 VIEW COMPARISON:  02/19/2013 FINDINGS: Cardiomediastinal silhouette is stable. Stable atelectasis or scarring left base.  Calcified granuloma in right midlung is stable. No acute infiltrate or pleural effusion. No pulmonary edema. Mild degenerative changes and osteopenia thoracic spine. IMPRESSION: No active cardiopulmonary disease. Mild degenerative changes and osteopenia thoracic spine. Electronically Signed   By: Natasha Mead M.D.   On: 05/23/2015 08:49   Ct Head Wo Contrast 05/23/2015  CLINICAL DATA:  Confusion EXAM: CT HEAD WITHOUT CONTRAST TECHNIQUE: Contiguous axial images were obtained from the base of the skull through the vertex without intravenous contrast. COMPARISON:  09/18/2011. FINDINGS: No skull fracture is noted. Paranasal sinuses and mastoid air cells are unremarkable. No intracranial hemorrhage, mass effect or midline shift. No acute cortical infarction. No mass lesion is noted on this unenhanced scan. Mild periventricular white matter decreased attenuation probable from chronic small vessel ischemic changes. Mild cerebral atrophy is stable. Small bilateral basal ganglia calcifications are again noted. IMPRESSION: No acute intracranial abnormality. Stable atrophy and mild periventricular chronic white matter disease. Electronically Signed  By: Natasha Mead M.D.   On: 05/23/2015 08:36    0900:  Pt's husband brought all meds with him: no tramadol rx, no valium bottle, ativan bottle with 19 tabs missing. Pt not lethargic, no focal deficit.  BUN/Cr elevated from baseline; judicious IVF given. Dx and testing d/w pt and family.  Questions answered.  Verb understanding, agreeable to admit.  T/C to [facility] Dr. Sherrie Mustache, case discussed, including:  HPI, pertinent PM/SHx, VS/PE, dx testing, ED course and treatment:  Agreeable to admit, requests to write temporary orders, obtain observation tele bed to team APAdmits.   Samuel Jester, DO 05/26/15 2348

## 2015-05-23 NOTE — ED Notes (Signed)
Pt's husband states that pt has had recent medication changes and this morning was very confused.  States that this has been occuring "several times lately" but not as bad as this morning.

## 2015-05-24 DIAGNOSIS — J449 Chronic obstructive pulmonary disease, unspecified: Secondary | ICD-10-CM

## 2015-05-24 DIAGNOSIS — I10 Essential (primary) hypertension: Secondary | ICD-10-CM

## 2015-05-24 DIAGNOSIS — F418 Other specified anxiety disorders: Secondary | ICD-10-CM | POA: Diagnosis not present

## 2015-05-24 DIAGNOSIS — E1121 Type 2 diabetes mellitus with diabetic nephropathy: Secondary | ICD-10-CM

## 2015-05-24 DIAGNOSIS — G9340 Encephalopathy, unspecified: Secondary | ICD-10-CM

## 2015-05-24 DIAGNOSIS — E039 Hypothyroidism, unspecified: Secondary | ICD-10-CM

## 2015-05-24 DIAGNOSIS — Z7901 Long term (current) use of anticoagulants: Secondary | ICD-10-CM

## 2015-05-24 LAB — CBC
HCT: 35.9 % — ABNORMAL LOW (ref 36.0–46.0)
HEMOGLOBIN: 11.3 g/dL — AB (ref 12.0–15.0)
MCH: 29.2 pg (ref 26.0–34.0)
MCHC: 31.5 g/dL (ref 30.0–36.0)
MCV: 92.8 fL (ref 78.0–100.0)
Platelets: 218 10*3/uL (ref 150–400)
RBC: 3.87 MIL/uL (ref 3.87–5.11)
RDW: 13.7 % (ref 11.5–15.5)
WBC: 5.3 10*3/uL (ref 4.0–10.5)

## 2015-05-24 LAB — BASIC METABOLIC PANEL
ANION GAP: 5 (ref 5–15)
BUN: 27 mg/dL — ABNORMAL HIGH (ref 6–20)
CHLORIDE: 104 mmol/L (ref 101–111)
CO2: 31 mmol/L (ref 22–32)
Calcium: 8.9 mg/dL (ref 8.9–10.3)
Creatinine, Ser: 1.11 mg/dL — ABNORMAL HIGH (ref 0.44–1.00)
GFR calc non Af Amer: 50 mL/min — ABNORMAL LOW (ref >60)
GFR, EST AFRICAN AMERICAN: 58 mL/min — AB (ref >60)
Glucose, Bld: 165 mg/dL — ABNORMAL HIGH (ref 65–99)
Potassium: 3.9 mmol/L (ref 3.5–5.1)
Sodium: 140 mmol/L (ref 135–145)

## 2015-05-24 LAB — RPR: RPR Ser Ql: NONREACTIVE

## 2015-05-24 LAB — PROTIME-INR
INR: 3.77 — ABNORMAL HIGH (ref 0.00–1.49)
Prothrombin Time: 36.3 seconds — ABNORMAL HIGH (ref 11.6–15.2)

## 2015-05-24 LAB — GLUCOSE, CAPILLARY
GLUCOSE-CAPILLARY: 165 mg/dL — AB (ref 65–99)
Glucose-Capillary: 127 mg/dL — ABNORMAL HIGH (ref 65–99)

## 2015-05-24 LAB — HEMOGLOBIN A1C
HEMOGLOBIN A1C: 7.4 % — AB (ref 4.8–5.6)
Mean Plasma Glucose: 166 mg/dL

## 2015-05-24 MED ORDER — LORAZEPAM 1 MG PO TABS: 0.5000 mg | ORAL_TABLET | Freq: Every day | ORAL | Status: DC | PRN

## 2015-05-24 NOTE — Care Management Note (Signed)
Case Management Note  Patient Details  Name: [name] MRN: [id number] Date of Birth: 1946/02/02  Subjective/Objective:                  Pt admitted from home with acute encephalopathy. Pt lives with her husband and will return home at discharge. Pt is normally independent with ADL's. Pt has a cane, walker, and wheelchair.  Action/Plan: Pt would benefit from home health RN for medication management. Pt would like [facility] for hh services. [facility] who will [facility] the pts information from the chart. Home health services to start within 48 hours of discharge. Anticipate discharge within 24 hours.  Expected Discharge Date:                  Expected Discharge Plan:  Home w Home Health Services  In-House Referral:  NA  Discharge planning Services  CM Consult  Post Acute Care Choice:  Home Health Choice offered to:  Patient  DME Arranged:    DME Agency:     HH Arranged:  RN HH Agency:  [facility]  Status of Service:  Completed, signed off  Medicare Important Message Given:    Date Medicare IM Given:    Medicare IM give by:    Date Additional Medicare IM Given:    Additional Medicare Important Message give by:     If discussed at Long Length of Stay Meetings, dates discussed:    Additional Comments:  Cheryl FlashBlackwell, Marget Outten Crowder, RN 05/24/2015, 11:23 AM

## 2015-05-24 NOTE — Progress Notes (Signed)
IV access removed.  Assisted patient to dress.  Discharge instructions reviewed with patient and her husband, questions answered.   Discharged via wheelchair accompanied by staff for discharge home in care of husband.  Stable at discharge.

## 2015-05-24 NOTE — Care Management Note (Signed)
Case Management Note  Patient Details  Name: [name] MRN: [id number] Date of Birth: Jul 02, 1945  Subjective/Objective:                    Action/Plan:   Expected Discharge Date:                  Expected Discharge Plan:  Home w Home Health Services  In-House Referral:  NA  Discharge planning Services  CM Consult  Post Acute Care Choice:  Home Health Choice offered to:  Patient  DME Arranged:    DME Agency:     HH Arranged:  RN HH Agency:  [facility]  Status of Service:  Completed, signed off  Medicare Important Message Given:    Date Medicare IM Given:    Medicare IM give by:    Date Additional Medicare IM Given:    Additional Medicare Important Message give by:     If discussed at Long Length of Stay Meetings, dates discussed:    Additional Comments: [name] and will collect the pts information from the chart. HH services to start within 48 hours of discharge. Pt and pts nurse aware of discharge arrangements. [name], Monica Termine [place]ie Villagerowder, RN 05/24/2015, 1:08 PM

## 2015-05-24 NOTE — Progress Notes (Signed)
ANTICOAGULATION CONSULT NOTE -  Pharmacy Consult for Coumadin (chronic Rx PTA) Indication: pulmonary embolus  Allergies  Allergen Reactions  . Procaine Hcl Anaphylaxis  . Codeine     "knocks me out" - states it is too strong  . Latex Hives and Rash  . Penicillins Diarrhea    Has patient had a PCN reaction causing immediate rash, facial/tongue/throat swelling, SOB or lightheadedness with hypotension: No Has patient had a PCN reaction causing severe rash involving mucus membranes or skin necrosis: No Has patient had a PCN reaction that required hospitalization No Has patient had a PCN reaction occurring within the last 10 years: No If all of the above answers are "NO", then may proceed with Cephalosporin use.    Patient Measurements: Height:  (157.5 cm) Weight: 187 lb 9.6 oz (85.095 kg) IBW/kg (Calculated) : 50.1  Vital Signs: Temp: 98.4 F (36.9 C) (12/14 0507) Temp Source: Oral (12/14 0507) BP: 133/79 mmHg (12/14 0507) Pulse Rate: 62 (12/14 0507)  Labs:  Recent Labs  05/23/15 0757 05/24/15 0544  HGB 12.4 11.3*  HCT 37.9 35.9*  PLT 230 218  LABPROT 33.7* 36.3*  INR 3.42* 3.77*  CREATININE 1.35* 1.11*  TROPONINI <0.03  --     Estimated Creatinine Clearance: 49.8 mL/min (by C-G formula based on Cr of 1.11).   Medical History: Past Medical History  Diagnosis Date  . Hypertension   . Diabetes mellitus   . Arthritis   . Blood transfusion 2007    History of  . PE (pulmonary embolism) February 2007; March 2013    Bilateral PEs in 07; massive PE March 2013 with moderate elevation in PA pressures.  . Anemia   . COPD (chronic obstructive pulmonary disease) with emphysema (HCC)   . Depression   . Anxiety   . Gastroesophageal reflux disease   . Osteoporosis   . DDD (degenerative disc disease)   . Uterine prolapse   . Asthma   . History of CHF (congestive heart failure) 2007    2013: Normal EF by echo x2 with no significant diastolic dysfunction noted  either  . Heart murmur     No significant valvular lesions on echo  . Hyperlipidemia   . Hypothyroidism (acquired)     On Levothyroxine  . Seizures (HCC)     once  . Headache(784.0)   . Heart palpitations   . Chronic kidney disease     lt kidney limited fxn  . Myocardial infarction (HCC)   . Chronic pain   . Anxiety and depression, ( ? early dementia) 08/14/2011    Medications:  Prescriptions prior to admission  Medication Sig Dispense Refill Last Dose  . amitriptyline (ELAVIL) 25 MG tablet Take 1 tablet by mouth daily.   unknown  . Cholecalciferol (VITAMIN D3) 5000 UNITS CAPS Take 5,000 Units by mouth daily.   05/22/2015 at Unknown time  . Choline Fenofibrate (FENOFIBRIC ACID) 135 MG CPDR Take 1 capsule by mouth daily. 30 capsule 5 05/22/2015 at Unknown time  . DULoxetine (CYMBALTA) 30 MG capsule Take 1 capsule (30 mg total) by mouth daily. 30 capsule 3 05/22/2015 at Unknown time  . fish oil-omega-3 fatty acids 1000 MG capsule Take 1 g by mouth every morning.    05/22/2015 at Unknown time  . furosemide (LASIX) 40 MG tablet Take 0.5 tablets (20 mg total) by mouth daily. 15 tablet 5 05/22/2015 at Unknown time  . gabapentin (NEURONTIN) 300 MG capsule 1 po BID (Patient taking differently: Take 300 mg by  mouth 2 (two) times daily. ) 60 capsule 5 05/22/2015 at Unknown time  . glimepiride (AMARYL) 2 MG tablet Take 1 tablet (2 mg total) by mouth daily before breakfast. 30 tablet 5 05/22/2015 at Unknown time  . ipratropium-albuterol (DUONEB) 0.5-2.5 (3) MG/3ML SOLN Take 3 mLs by nebulization every 4 (four) hours as needed. For shortness of breath 360 mL 5 unknown  . levothyroxine (SYNTHROID, LEVOTHROID) 88 MCG tablet TAKE (1) TABLET ONCE DAILY IN THE MORNING BEFORE BREAKFAST. 30 tablet 5 05/22/2015 at Unknown time  . LORazepam (ATIVAN) 1 MG tablet Take 1 tablet (1 mg total) by mouth daily as needed for anxiety. 30 tablet 1 05/23/2015 at Unknown time  . losartan (COZAAR) 25 MG tablet Take 1  tablet (25 mg total) by mouth daily. 30 tablet 5 05/22/2015 at Unknown time  . meclizine (ANTIVERT) 50 MG tablet Take 50 mg by mouth daily as needed for dizziness.    unknown  . metFORMIN (GLUCOPHAGE) 500 MG tablet Take 1 tablet (500 mg total) by mouth 2 (two) times daily with a meal. 60 tablet 5 05/22/2015 at Unknown time  . metoprolol succinate (TOPROL-XL) 25 MG 24 hr tablet TAKE (1) TABLET BY MOUTH ONCE DAILY. 30 tablet 5 05/22/2015 at Unknown time  . oxybutynin (DITROPAN) 5 MG tablet Take 1 tablet (5 mg total) by mouth 3 (three) times daily. 90 tablet 2 05/22/2015 at Unknown time  . pantoprazole (PROTONIX) 20 MG tablet Take 1 tablet (20 mg total) by mouth 2 (two) times daily. 60 tablet 4 05/22/2015 at Unknown time  . potassium chloride SA (K-DUR,KLOR-CON) 20 MEQ tablet Take 1 tablet (20 mEq total) by mouth every morning. 30 tablet 5 05/22/2015 at Unknown time  . PROAIR HFA 108 (90 BASE) MCG/ACT inhaler INHALE 2 PUFFS INTO THE LUNGS EVERY 6 HOURS AS NEEDED FOR SHORTNESS OF BREATH. 8.5 g 1 unknown  . simvastatin (ZOCOR) 40 MG tablet Take 1 tablet (40 mg total) by mouth at bedtime. 30 tablet 5 05/22/2015 at Unknown time  . traMADol (ULTRAM) 50 MG tablet Take 50 mg by mouth every 6 (six) hours as needed for moderate pain.   05/23/2015 at Unknown time  . warfarin (COUMADIN) 5 MG tablet Take 1/2 pill daily, take 1 whole pill on Monday and friday 30 tablet 5 05/22/2015 at 1800  . diazepam (VALIUM) 10 MG tablet Take 1 tablet by mouth.     Marland Kitchen. glucose blood (ACCU-CHEK AVIVA) test strip Use to check blood glucose up to bid. Dx: E11.9 type 2 diabetes 100 each 2 Taking  . Lancets (ACCU-CHEK SOFT TOUCH) lancets Use to check BG up to bid. Dx: E11.9 - type 2 DM 100 each 2 Taking  . tiZANidine (ZANAFLEX) 4 MG tablet Take 1 tablet (4 mg total) by mouth every 6 (six) hours as needed for muscle spasms. 30 tablet 2 Taking   Assessment: 69yo female with h/o PE on chronic Coumadin.  INR is SUPRAtherapeutic   Goal of  Therapy:  INR 2-3 Monitor platelets by anticoagulation protocol: Yes   Plan:  HOLD coumadin today due to elevated INR INR daily  [name], Taci Sterling A 05/24/2015,8:45 AM

## 2015-05-24 NOTE — Discharge Summary (Signed)
Physician Discharge Summary  [name] ZOX:[id number] DOB: 01-02-1946 DOA: 05/23/2015  PCP: Kevin Fenton, MD  Admit date: 05/23/2015 Discharge date: 05/24/2015  Time spent: 25 minutes  Recommendations for Outpatient Follow-up:  1. Follow up with PCP on 12/19 as previously scheduled. 2. Consider repeat TSH on follow up with PCP.  Discharge Diagnoses:  Principal Problem:   Acute encephalopathy Active Problems:   Essential hypertension, benign   Hx pulmonary embolism   COPD mixed type (HCC)   Anxiety and depression, ( ? early dementia)   Chronic anticoagulation, pt will need lifelong anticoagulation   Obesity (BMI 30-39.9)   Type 2 diabetes with nephropathy (HCC)   Altered mental status   Acute kidney injury [facility])   Hypothyroidism   Discharge Condition: Improved  Diet recommendation: Heart healthy  Filed Weights   05/23/15 1034 05/24/15 0507  Weight: 84.913 kg (187 lb 3.2 oz) 85.095 kg (187 lb 9.6 oz)    History of present illness:  69 y.o female with a history of depression with anxiety, hypertension, chronic kidney disease, diabetes mellitus, and history of pulmonary embolism-on warfarin, who presented to the ED with a report of confusion at home. Per husband, she got up  "fussing around" and told him that she was going to make him breakfast, but she pulled out the trash can instead. She told him that she "took her meds" the morning of 12/13 which was not her usual routine.  Her husband stated that there were recent medication changes by her PCP including Valium discontinued and Ativan started; Cymbalta started; and prescription given for Ultram. While in the ED, the patient was bradycardic with a heart rate of 47-51. Head CT revealed no acute abnormalities, but with stable chronic white matter disease. She was admitted for further evaluation and management.  Hospital Course:  Patient presented with acute encephalopathy, possibly related to medication overuse.  Patient was prescribed Ativan  daily PRN on 12/5. Within 8 days, she had 19 pills missing from her bottle.  Patient denies any intent to harm herself but is unsure if she took any extra medications. Her head CT revealed no acute abnormality but showed stable chronic white matter disease. TSH 9.7, B12 wnl. She was given IVF with her medications on hold with significant improvement. She is now alert and oriented. She will be discharged with a decreased dose of Ativan and plan to follow up with her PCP on 12/19 as scheduled.    1. Mild AKI superimposed on CKD stage I-II. Appears back to baseline with gentle IVF.  2. Hypothyroidism, continue Synthroid. TSH 9.273, Free T4 wnl. She will need to follow up with her PCP for further adjustment of Synthroid if needed.  3. Hx of PE, on Coumadin. INR 3.77.  Coumadin dosing per PCP. 4. Essential HTN, stable. Continue home meds. 5. Bradycardia, stable. Likely from Toprol-XL.  6. DM type 2, stable.  HgbA1C 7.4. Continue home regimen.  7. COPD without exacerbation, continue home management.  8. Depression and Anxiety, medications held during admission. Will resume with decreased dose of Ativan upon discharge. Further medication adjustments deferred to PCP.   Procedures:  none  Consultations:  none  Discharge Exam: Filed Vitals:   05/23/15 2100 05/24/15 0507  BP: 142/61 133/79  Pulse: 58 62  Temp: 97.8 F (36.6 C) 98.4 F (36.9 C)  Resp: 20 20     General: NAD, looks comfortable  Cardiovascular: RRR, S1, S2   Respiratory: clear bilaterally, No wheezing, rales or rhonchi  Abdomen:  soft, non tender, no distention , bowel sounds normal  Musculoskeletal: No edema b/l   Discharge Instructions   Discharge Instructions    Diet - low sodium heart healthy    Complete by:  As directed      Increase activity slowly    Complete by:  As directed           Current Discharge Medication List    CONTINUE these medications which have CHANGED    Details  LORazepam (ATIVAN) 1 MG tablet Take 0.5 tablets (0.5 mg total) by mouth daily as needed for anxiety. Qty: 30 tablet, Refills: 1      CONTINUE these medications which have NOT CHANGED   Details  Cholecalciferol (VITAMIN D3) 5000 UNITS CAPS Take 5,000 Units by mouth daily.    Choline Fenofibrate (FENOFIBRIC ACID) 135 MG CPDR Take 1 capsule by mouth daily. Qty: 30 capsule, Refills: 5   Associated Diagnoses: Hyperlipidemia with target LDL less than 100    DULoxetine (CYMBALTA) 30 MG capsule Take 1 capsule (30 mg total) by mouth daily. Qty: 30 capsule, Refills: 3    fish oil-omega-3 fatty acids 1000 MG capsule Take 1 g by mouth every morning.     furosemide (LASIX) 40 MG tablet Take 0.5 tablets (20 mg total) by mouth daily. Qty: 15 tablet, Refills: 5    gabapentin (NEURONTIN) 300 MG capsule 1 po BID Qty: 60 capsule, Refills: 5   Associated Diagnoses: Diabetic polyneuropathy associated with type 2 diabetes mellitus (HCC)    glimepiride (AMARYL) 2 MG tablet Take 1 tablet (2 mg total) by mouth daily before breakfast. Qty: 30 tablet, Refills: 5   Associated Diagnoses: DM type 2, uncontrolled, with neuropathy (HCC)    ipratropium-albuterol (DUONEB) 0.5-2.5 (3) MG/3ML SOLN Take 3 mLs by nebulization every 4 (four) hours as needed. For shortness of breath Qty: 360 mL, Refills: 5    levothyroxine (SYNTHROID, LEVOTHROID) 88 MCG tablet TAKE (1) TABLET ONCE DAILY IN THE MORNING BEFORE BREAKFAST. Qty: 30 tablet, Refills: 5   Associated Diagnoses: Hypothyroidism, unspecified hypothyroidism type    losartan (COZAAR) 25 MG tablet Take 1 tablet (25 mg total) by mouth daily. Qty: 30 tablet, Refills: 5   Associated Diagnoses: Essential hypertension, benign    meclizine (ANTIVERT) 50 MG tablet Take 50 mg by mouth daily as needed for dizziness.     metFORMIN (GLUCOPHAGE) 500 MG tablet Take 1 tablet (500 mg total) by mouth 2 (two) times daily with a meal. Qty: 60 tablet, Refills: 5    Associated Diagnoses: DM type 2, uncontrolled, with neuropathy (HCC)    metoprolol succinate (TOPROL-XL) 25 MG 24 hr tablet TAKE (1) TABLET BY MOUTH ONCE DAILY. Qty: 30 tablet, Refills: 5   Associated Diagnoses: Coronary artery disease involving native coronary artery of native heart without angina pectoris    oxybutynin (DITROPAN) 5 MG tablet Take 1 tablet (5 mg total) by mouth 3 (three) times daily. Qty: 90 tablet, Refills: 2   Associated Diagnoses: OAB (overactive bladder)    pantoprazole (PROTONIX) 20 MG tablet Take 1 tablet (20 mg total) by mouth 2 (two) times daily. Qty: 60 tablet, Refills: 4   Associated Diagnoses: Hematemesis with nausea    potassium chloride SA (K-DUR,KLOR-CON) 20 MEQ tablet Take 1 tablet (20 mEq total) by mouth every morning. Qty: 30 tablet, Refills: 5   Associated Diagnoses: Hypokalemia; Essential hypertension, benign    PROAIR HFA 108 (90 BASE) MCG/ACT inhaler INHALE 2 PUFFS INTO THE LUNGS EVERY 6 HOURS AS NEEDED FOR  SHORTNESS OF BREATH. Qty: 8.5 g, Refills: 1    simvastatin (ZOCOR) 40 MG tablet Take 1 tablet (40 mg total) by mouth at bedtime. Qty: 30 tablet, Refills: 5   Associated Diagnoses: Hyperlipidemia with target LDL less than 100    traMADol (ULTRAM) 50 MG tablet Take 50 mg by mouth every 6 (six) hours as needed for moderate pain.    warfarin (COUMADIN) 5 MG tablet Take 1/2 pill daily, take 1 whole pill on Monday and friday Qty: 30 tablet, Refills: 5   Associated Diagnoses: Hx pulmonary embolism    glucose blood (ACCU-CHEK AVIVA) test strip Use to check blood glucose up to bid. Dx: E11.9 type 2 diabetes Qty: 100 each, Refills: 2    Lancets (ACCU-CHEK SOFT TOUCH) lancets Use to check BG up to bid. Dx: E11.9 - type 2 DM Qty: 100 each, Refills: 2    tiZANidine (ZANAFLEX) 4 MG tablet Take 1 tablet (4 mg total) by mouth every 6 (six) hours as needed for muscle spasms. Qty: 30 tablet, Refills: 2      STOP taking these medications      amitriptyline (ELAVIL) 25 MG tablet      diazepam (VALIUM) 10 MG tablet        Allergies  Allergen Reactions  . Procaine Hcl Anaphylaxis  . Codeine     "knocks me out" - states it is too strong  . Latex Hives and Rash  . Penicillins Diarrhea    Has patient had a PCN reaction causing immediate rash, facial/tongue/throat swelling, SOB or lightheadedness with hypotension: No Has patient had a PCN reaction causing severe rash involving mucus membranes or skin necrosis: No Has patient had a PCN reaction that required hospitalization No Has patient had a PCN reaction occurring within the last 10 years: No If all of the above answers are "NO", then may proceed with Cephalosporin use.    Follow-up Information    Follow up with [facility].   Contact information:   [address] [place] [address] [phone]        The results of significant diagnostics from this hospitalization (including imaging, microbiology, ancillary and laboratory) are listed below for reference.    Significant Diagnostic Studies: Dg Chest 2 View  05/23/2015  CLINICAL DATA:  Confusion EXAM: CHEST  2 VIEW COMPARISON:  02/19/2013 FINDINGS: Cardiomediastinal silhouette is stable. Stable atelectasis or scarring left base. Calcified granuloma in right midlung is stable. No acute infiltrate or pleural effusion. No pulmonary edema. Mild degenerative changes and osteopenia thoracic spine. IMPRESSION: No active cardiopulmonary disease. Mild degenerative changes and osteopenia thoracic spine. Electronically Signed   By: Natasha Mead M.D.   On: 05/23/2015 08:49   Ct Head Wo Contrast  05/23/2015  CLINICAL DATA:  Confusion EXAM: CT HEAD WITHOUT CONTRAST TECHNIQUE: Contiguous axial images were obtained from the base of the skull through the vertex without intravenous contrast. COMPARISON:  09/18/2011. FINDINGS: No skull fracture is noted. Paranasal sinuses and mastoid air cells are unremarkable. No  intracranial hemorrhage, mass effect or midline shift. No acute cortical infarction. No mass lesion is noted on this unenhanced scan. Mild periventricular white matter decreased attenuation probable from chronic small vessel ischemic changes. Mild cerebral atrophy is stable. Small bilateral basal ganglia calcifications are again noted. IMPRESSION: No acute intracranial abnormality. Stable atrophy and mild periventricular chronic white matter disease. Electronically Signed   By: Natasha Mead M.D.   On: 05/23/2015 08:36     Labs: Basic Metabolic Panel:  Recent Labs Lab 05/23/15 0757 05/24/15 0544  NA 138 140  K 3.8 3.9  CL 98* 104  CO2 30 31  GLUCOSE 136* 165*  BUN 32* 27*  CREATININE 1.35* 1.11*  CALCIUM 9.7 8.9   Liver Function Tests:  Recent Labs Lab 05/23/15 0757  AST 28  ALT 22  ALKPHOS 38  BILITOT 0.4  PROT 7.5  ALBUMIN 3.9   CBC:  Recent Labs Lab 05/23/15 0757 05/24/15 0544  WBC 7.6 5.3  NEUTROABS 5.1  --   HGB 12.4 11.3*  HCT 37.9 35.9*  MCV 91.5 92.8  PLT 230 218   Cardiac Enzymes:  Recent Labs Lab 05/23/15 0757  TROPONINI <0.03     CBG:  Recent Labs Lab 05/23/15 1221 05/23/15 1641 05/23/15 2039 05/24/15 0757 05/24/15 1125  GLUCAP 82 207* 78 127* 165*      Signed: Lucianne Smestad. MD [facility] 05/24/2015, 11:59 AM   By signing my name below, I, Burnett HarryJennifer Gregorio, attest that this documentation has been prepared under the direction and in the presence of [facility] CenterJehanzeb Anaja Monts. MD Electronically Signed: Burnett HarryJennifer Gregorio, Scribe. 05/24/2015  I, Dr. Erick BlinksJehanzeb Lorann Tani, personally performed the services described in this documentaiton. All medical record entries made by the scribe were at my direction and in my presence. I have reviewed the chart and agree that the record reflects my personal performance and is accurate and complete  Erick BlinksJehanzeb Elmer Boutelle, MD, 05/24/2015 11:59 AM

## 2015-05-24 NOTE — Care Management Obs Status (Signed)
MEDICARE OBSERVATION STATUS NOTIFICATION   Patient Details  Name: [name] MRN: [id number] Date of Birth: April 16, 1946   Medicare Observation Status Notification Given:  Yes    Cheryl FlashBlackwell, Donya Tomaro Crowder, RN 05/24/2015, 11:22 AM

## 2015-05-25 ENCOUNTER — Telehealth: Payer: Self-pay | Admitting: Family Medicine

## 2015-05-25 LAB — URINE CULTURE: Culture: >=100000

## 2015-05-25 NOTE — Telephone Encounter (Signed)
Call Completed and Appointment Scheduled: Yes, Date: 05/28/18 with Ermalinda MemosBradshaw   DISCHARGE INFORMATION Date of Discharge:05/24/15  [facility]  Principal Discharge Diagnosis: Altered Mental Status  Patient and/or caregiver is knowledgeable of his/her condition(s) and treatment: Yes   MEDICATION RECONCILIATION Medication list reviewed with patient: Yes  Patient is able to obtain needed medications: Yes   ACTIVITIES OF DAILY LIVING  Is the patient able to perform his/her own ADLs: Yes  Patient is receiving home health services: no   PATIENT EDUCATION Questions/Concerns Discussed: Patient hasn't eaten or taken any medications. She wanted to talk with our office before she did either. Advised that she could eat. Thoroughly discussed which mediations she should be taking and how. Patient's husband assisted with this as well. I suggested she have her pharmacy organize a weekly pill box. Patient stated that the pharmacy recently did that for her and stated that the pill box was the reason for her confusion. I didn't really understand why putting them in a pill box made it more difficult but she said that it did. Stressed that she is to only take 1/2 of the Ativan once a day IF NEEDED. She doesn't need this daily. Her husband marked 1/2 tablet on the bottle and they both stated understanding. Patient was able to describe her visit and hospital stay and seemed to be oriented today. If I remember correctly from previous encounters, she has some mild confusion anyway and I believe she was close to baseline today. Her husband seems knowledgeable about her medications and her upcoming appt. They will contact our office if they have anymore questions or concerns.

## 2015-05-29 ENCOUNTER — Ambulatory Visit (INDEPENDENT_AMBULATORY_CARE_PROVIDER_SITE_OTHER): Payer: Medicare Other | Admitting: Family Medicine

## 2015-05-29 ENCOUNTER — Encounter: Payer: Self-pay | Admitting: Family Medicine

## 2015-05-29 ENCOUNTER — Ambulatory Visit: Payer: Medicare Other | Admitting: Family Medicine

## 2015-05-29 VITALS — BP 111/51 | HR 56 | Temp 97.8°F | Ht 62.0 in | Wt 187.0 lb

## 2015-05-29 DIAGNOSIS — N3000 Acute cystitis without hematuria: Secondary | ICD-10-CM

## 2015-05-29 DIAGNOSIS — R8271 Bacteriuria: Secondary | ICD-10-CM

## 2015-05-29 DIAGNOSIS — I2510 Atherosclerotic heart disease of native coronary artery without angina pectoris: Secondary | ICD-10-CM | POA: Diagnosis not present

## 2015-05-29 DIAGNOSIS — N390 Urinary tract infection, site not specified: Secondary | ICD-10-CM | POA: Insufficient documentation

## 2015-05-29 MED ORDER — METOPROLOL SUCCINATE ER 25 MG PO TB24: 12.5000 mg | ORAL_TABLET | Freq: Every day | ORAL | Status: DC

## 2015-05-29 MED ORDER — NITROFURANTOIN MONOHYD MACRO 100 MG PO CAPS: 100.0000 mg | ORAL_CAPSULE | Freq: Two times a day (BID) | ORAL | Status: DC

## 2015-05-29 NOTE — Progress Notes (Signed)
   HPI  Patient presents today for transitional care, hospital follow-up.  Patient was admitted to [facility] for altered mental status and discharged on December 14.  Her medications were reviewed in detail and she has no questions about them today  She is feeling "100% better". She states that she had difficulty with a new medication box that her pharmacist provided. She feels this is the source of all the problems.  She was found to have enterococcus in her urine, however she's had no symptoms or signs of infection. She denies dysuria, abdominal pain, back pain, and urinary frequency today. She does have stable stress incontinence for about 6 months.  She is okay with the decrease in Ativan at the hospital has provided. She has returned her medications to the original bottles, with the pharmacist help, and feels that everything should be easy to understand.  Her pharmacist has recommended considering discontinuing fenofibrate  PMH: Smoking status noted ROS: Per HPI  Objective: BP 111/51 mmHg  Pulse 56  Temp(Src) 97.8 F (36.6 C) (Oral)  Ht 5\' 2"  (1.575 m)  Wt 187 lb (84.823 kg)  BMI 34.19 kg/m2 Gen: NAD, alert, cooperative with exam HEENT: NCAT CV: RRR, good S1/S2, no murmur Resp: CTABL, no wheezes, non-labored Abd: SNTND, BS present, no guarding or organomegaly, no CVA tenderness Ext: No edema, warm Neuro: Alert and conversational, no gross deficits  Assessment and plan:  # Altered mental status Appears to be primarily due to medication overuse from confusion I agree with her decrease in benzodiazepine dose, she's gone from 1 mg Ativan to 0.5 mg, up to once daily. Continue Cymbalta, and tramadol but clearly we should proceed with caution. I was initially concerned about UTI, however she does not have symptoms of a UTI and she is returned to baseline mental status currently and feels "100% better. So this is more consistent with asymptomatic bacteriuria.  #  Asymptomatic bacteriuria Urine culture repeated Discussed with her maintaining low threshold for checking for UTI Enterococcus sensitive to Levaquin, and Macrobid  Bradycardia Decrease metoprolol dose to 12.5 mg Asymptomatic  I did initially prescribed Macrobid, however considering that she is returned to baseline status and she has no symptoms of a UTI and I agree with treating this as asymptomatic bacteria. My nurse has called the pharmacy and canceled the prescription  Orders Placed This Encounter  Procedures  . Urine culture    Meds ordered this encounter  Medications  . DISCONTD: nitrofurantoin, macrocrystal-monohydrate, (MACROBID) 100 MG capsule    Sig: Take 1 capsule (100 mg total) by mouth 2 (two) times daily.    Dispense:  14 capsule    Refill:  0    Murtis SinkSam Bradshaw, MD [facility] 05/29/2015, 3:38 PM

## 2015-05-29 NOTE — Patient Instructions (Addendum)
Great to see you!  Please let us know if you need anything, please come back in 1 month  Stop fenofibrate, we will re-check your labs in 3 months     Asymptomatic Bacteriuria, Female Asymptomatic bacteriuria is the presence of a large number of bacteria in your urine without the usual symptoms of burning or frequent urination. The following conditions increase the risk of asymptomatic bacteriuria:  Diabetes mellitus.  Advanced age.  Pregnancy in the first trimester.  Kidney stones.  Kidney transplants.  Leaky kidney tube valve in young children (reflux). Treatment for this condition is not needed in most people and can lead to other problems such as too much yeast and growth of resistant bacteria. However, some people, such as pregnant women, do need treatment to prevent kidney infection. Asymptomatic bacteriuria in pregnancy is also associated with fetal growth restriction, premature labor, and newborn death. HOME CARE INSTRUCTIONS Monitor your condition for any changes. The following actions may help to relieve any discomfort you are feeling:  Drink enough water and fluids to keep your urine clear or pale yellow. Go to the bathroom more often to keep your bladder empty.  Keep the area around your vagina and rectum clean. Wipe yourself from front to back after urinating. SEEK IMMEDIATE MEDICAL CARE IF:  You develop signs of an infection such as:  Burning with urination.  Frequency of voiding.  Back pain.  Fever.  You have blood in the urine.  You develop a fever. MAKE SURE YOU:  Understand these instructions.  Will watch your condition.  Will get help right away if you are not doing well or get worse.   This information is not intended to replace advice given to you by your health care provider. Make sure you discuss any questions you have with your health care provider.   Document Released: 05/27/2005 Document Revised: 06/17/2014 Document Reviewed:  11/16/2012 Elsevier Interactive Patient Education [employer]2016 Elsevier Inc.

## 2015-06-30 ENCOUNTER — Ambulatory Visit (INDEPENDENT_AMBULATORY_CARE_PROVIDER_SITE_OTHER): Payer: Medicare Other | Admitting: Family Medicine

## 2015-06-30 ENCOUNTER — Encounter: Payer: Self-pay | Admitting: Family Medicine

## 2015-06-30 VITALS — BP 139/60 | HR 52 | Temp 97.9°F | Ht 62.0 in | Wt 182.8 lb

## 2015-06-30 DIAGNOSIS — H9191 Unspecified hearing loss, right ear: Secondary | ICD-10-CM | POA: Diagnosis not present

## 2015-06-30 DIAGNOSIS — H6121 Impacted cerumen, right ear: Secondary | ICD-10-CM | POA: Diagnosis not present

## 2015-06-30 DIAGNOSIS — I10 Essential (primary) hypertension: Secondary | ICD-10-CM

## 2015-06-30 DIAGNOSIS — Z1239 Encounter for other screening for malignant neoplasm of breast: Secondary | ICD-10-CM | POA: Diagnosis not present

## 2015-06-30 DIAGNOSIS — H9190 Unspecified hearing loss, unspecified ear: Secondary | ICD-10-CM | POA: Insufficient documentation

## 2015-06-30 DIAGNOSIS — Z0000 Encounter for general adult medical examination without abnormal findings: Secondary | ICD-10-CM

## 2015-06-30 NOTE — Patient Instructions (Addendum)
Great to see you!  We will call about a mammo gram We will also work on an audiology appointment  Lets see you back in March or early April to discuss diabetes   Please let us know if you have any concerns  Make an appointment for a mammogram on the way out today

## 2015-06-30 NOTE — Progress Notes (Signed)
   HPI  Patient presents today for follow-up.  Patient was hospitalized December for altered mental status.  She states that after thinking about it for a while she realizes that she was under acute stress as her son had just wrecked his car and nearly died. Previously she felt like it was a medication problem. She has no problems with her medications currently.  She feels well.  She has the feeling of some object in her right ear, she states that she's needed in ear wash out before. She also has decreased hearing in her right ear, previously she missed an appointment to the audiologist. She would like to go.  Hypertension Doing well medications No chest pain, dyspnea, palpitations, leg edema.  PMH: Smoking status noted ROS: Per HPI  Objective: BP 139/60 mmHg  Pulse 52  Temp(Src) 97.9 F (36.6 C) (Oral)  Ht  (1.575 m)  Wt 182 lb 12.8 oz (82.918 kg)  BMI 33.43 kg/m2 Gen: NAD, alert, cooperative with exam HEENT: NCAT, cerumen possibly touching the right TM, left TM normal CV: RRR, good S1/S2, no murmur Resp: CTABL, no wheezes, non-labored Ext: No edema, warm Neuro: Alert and oriented, No gross deficits  Assessment and plan:  # Hypertension Abdomen No changes to medications Follow-up 2 months for diabetes  # Decreased hearing, foreign body sensation in right ear Refer to audiology Ear washed out in clinic today  # Healthcare maintenance, breast cancer screening Schedule mammogram     Orders Placed This Encounter  Procedures  . Ambulatory referral to Audiology    Referral Priority:  Routine    Referral Type:  Audiology Exam    Referral Reason:  Specialty Services Required    Number of Visits Requested:  1     Murtis Sink, MD [facility] 06/30/2015, 2:44 PM

## 2015-07-04 ENCOUNTER — Other Ambulatory Visit: Payer: Self-pay | Admitting: Nurse Practitioner

## 2015-07-05 ENCOUNTER — Other Ambulatory Visit: Payer: Self-pay | Admitting: *Deleted

## 2015-07-05 DIAGNOSIS — N3281 Overactive bladder: Secondary | ICD-10-CM

## 2015-07-05 MED ORDER — OXYBUTYNIN CHLORIDE 5 MG PO TABS: 5.0000 mg | ORAL_TABLET | Freq: Three times a day (TID) | ORAL | Status: DC

## 2015-07-06 ENCOUNTER — Telehealth: Payer: Self-pay | Admitting: Family Medicine

## 2015-07-06 ENCOUNTER — Other Ambulatory Visit: Payer: Self-pay

## 2015-07-06 MED ORDER — LORAZEPAM 1 MG PO TABS: 0.5000 mg | ORAL_TABLET | Freq: Every day | ORAL | Status: DC | PRN

## 2015-07-06 NOTE — Telephone Encounter (Signed)
rx called into pharmacy and pt aware and advised to stop Valium.

## 2015-07-06 NOTE — Telephone Encounter (Signed)
Stp and clarified lorazepam rx. Pt voiced understanding.

## 2015-07-06 NOTE — Telephone Encounter (Signed)
Last seen 06/30/15 Dr Ermalinda Memos   If approved route to nurse to call into [facility]   [phone]

## 2015-07-18 ENCOUNTER — Telehealth: Payer: Self-pay | Admitting: Family Medicine

## 2015-07-27 ENCOUNTER — Ambulatory Visit (INDEPENDENT_AMBULATORY_CARE_PROVIDER_SITE_OTHER): Payer: Medicare Other | Admitting: Family Medicine

## 2015-07-27 ENCOUNTER — Encounter: Payer: Self-pay | Admitting: Family Medicine

## 2015-07-27 VITALS — BP 141/62 | HR 60 | Temp 97.2°F | Ht 62.0 in | Wt 182.8 lb

## 2015-07-27 DIAGNOSIS — N183 Chronic kidney disease, stage 3 unspecified: Secondary | ICD-10-CM

## 2015-07-27 DIAGNOSIS — Z7901 Long term (current) use of anticoagulants: Secondary | ICD-10-CM | POA: Diagnosis not present

## 2015-07-27 DIAGNOSIS — Z86711 Personal history of pulmonary embolism: Secondary | ICD-10-CM

## 2015-07-27 DIAGNOSIS — E1122 Type 2 diabetes mellitus with diabetic chronic kidney disease: Secondary | ICD-10-CM

## 2015-07-27 DIAGNOSIS — E1121 Type 2 diabetes mellitus with diabetic nephropathy: Secondary | ICD-10-CM | POA: Diagnosis not present

## 2015-07-27 LAB — POCT INR: INR: 3.4

## 2015-07-27 NOTE — Patient Instructions (Signed)
Great to see you!  Come back in 1 month to see Tammy our pharmacist for diabetes and INR  We will call with results within 1 week

## 2015-07-27 NOTE — Progress Notes (Signed)
   HPI  Patient presents today in routine follow-up.  Chronic anticoagulation due to pulmonary embolism, she has not had an INR in 2 months. She is taking her Coumadin regularly, one half pill daily and one whole pill on Monday. Has nosebleeds 2-3 times per week last month that has now gotten better. No other bleeding   Diabetes Good medication compliance  Healthcare maintenance She missed her mammogram appointment.  Pain Doing well only Tylenol, would like to quit tramadol. She disclosed with my CMA privately that she believes her children have been taking the tramadol from the pharmacy. She like to take only Tylenol, she has also stopped Cymbalta.   PMH: Smoking status noted ROS: Per HPI  Objective: BP 141/62 mmHg  Pulse 60  Temp(Src) 97.2 F (36.2 C) (Oral)  Ht _0  (1.575 m)  Wt 182 lb 12.8 oz (82.918 kg)  BMI 33.43 kg/m2 Gen: NAD, alert, cooperative with exam HEENT: NCAT CV: RRR, good S1/S2, no murmur Resp: CTABL, no wheezes, non-labored Ext: No edema, warm Neuro: Alert and oriented, No gross deficits  Assessment and plan:  # chronic anticoag Pt left without lab, Nursing will call for check today Nasal saline for infrequent nosebleed  # DM2 CMP for renal function, consider stopping metformin  # Chronic pain HAs stopped cymbalata and doing well Also not taking tramadol, dc Tylenol for pain.  Avoid NSAIDs with CKD 3  CKD3 Labs, discussed no NSAIDs  Orders Placed This Encounter  Procedures  . CMP14+EGFR  . POCT INR     Laroy Apple, MD [facility] 07/27/2015, 9:28 AM

## 2015-07-28 LAB — CMP14+EGFR
ALK PHOS: 37 IU/L — AB (ref 39–117)
ALT: 16 IU/L (ref 0–32)
AST: 17 IU/L (ref 0–40)
Albumin/Globulin Ratio: 1.5 (ref 1.1–2.5)
Albumin: 4 g/dL (ref 3.6–4.8)
BILIRUBIN TOTAL: 0.3 mg/dL (ref 0.0–1.2)
BUN/Creatinine Ratio: 20 (ref 11–26)
BUN: 22 mg/dL (ref 8–27)
CHLORIDE: 105 mmol/L (ref 96–106)
CO2: 23 mmol/L (ref 18–29)
CREATININE: 1.09 mg/dL — AB (ref 0.57–1.00)
Calcium: 9.5 mg/dL (ref 8.7–10.3)
GFR calc Af Amer: 61 mL/min/{1.73_m2} (ref >59)
GFR calc non Af Amer: 53 mL/min/{1.73_m2} — ABNORMAL LOW (ref >59)
GLOBULIN, TOTAL: 2.6 g/dL (ref 1.5–4.5)
GLUCOSE: 204 mg/dL — AB (ref 65–99)
POTASSIUM: 5 mmol/L (ref 3.5–5.2)
SODIUM: 144 mmol/L (ref 134–144)
Total Protein: 6.6 g/dL (ref 6.0–8.5)

## 2015-07-31 ENCOUNTER — Telehealth: Payer: Self-pay | Admitting: Family Medicine

## 2015-07-31 NOTE — Telephone Encounter (Signed)
Stp and she states she woke up this morning with a nosebleed but it is better now. Pt didn't get the nasal saline that Dr.Bradshaw had advised to try for the nosebleeds, so pt will get that today and continue to monitor the nosebleeds.

## 2015-08-03 ENCOUNTER — Other Ambulatory Visit: Payer: Self-pay | Admitting: Nurse Practitioner

## 2015-08-08 ENCOUNTER — Encounter: Payer: Medicare Other | Admitting: *Deleted

## 2015-08-13 DIAGNOSIS — Z7984 Long term (current) use of oral hypoglycemic drugs: Secondary | ICD-10-CM | POA: Diagnosis not present

## 2015-08-13 DIAGNOSIS — E119 Type 2 diabetes mellitus without complications: Secondary | ICD-10-CM | POA: Diagnosis not present

## 2015-08-13 DIAGNOSIS — S82831A Other fracture of upper and lower end of right fibula, initial encounter for closed fracture: Secondary | ICD-10-CM | POA: Diagnosis not present

## 2015-08-13 DIAGNOSIS — I10 Essential (primary) hypertension: Secondary | ICD-10-CM | POA: Diagnosis not present

## 2015-08-13 DIAGNOSIS — W1839XA Other fall on same level, initial encounter: Secondary | ICD-10-CM | POA: Diagnosis not present

## 2015-08-13 DIAGNOSIS — S82434A Nondisplaced oblique fracture of shaft of right fibula, initial encounter for closed fracture: Secondary | ICD-10-CM | POA: Diagnosis not present

## 2015-08-13 DIAGNOSIS — Z79899 Other long term (current) drug therapy: Secondary | ICD-10-CM | POA: Diagnosis not present

## 2015-08-13 DIAGNOSIS — Z7982 Long term (current) use of aspirin: Secondary | ICD-10-CM | POA: Diagnosis not present

## 2015-08-15 ENCOUNTER — Ambulatory Visit (INDEPENDENT_AMBULATORY_CARE_PROVIDER_SITE_OTHER): Payer: Medicare Other | Admitting: Family Medicine

## 2015-08-15 ENCOUNTER — Encounter: Payer: Self-pay | Admitting: Family Medicine

## 2015-08-15 VITALS — BP 114/62 | HR 55 | Temp 97.4°F | Ht 62.0 in | Wt 180.5 lb

## 2015-08-15 DIAGNOSIS — S82401A Unspecified fracture of shaft of right fibula, initial encounter for closed fracture: Secondary | ICD-10-CM

## 2015-08-15 MED ORDER — OXYCODONE-ACETAMINOPHEN 5-325 MG PO TABS: 1.0000 | ORAL_TABLET | ORAL | Status: DC | PRN

## 2015-08-15 NOTE — Progress Notes (Signed)
Subjective:  Patient ID: [name], female    DOB: Mar 23, 1946  Age: 70 y.o. MRN: [id number]  CC: Hospitalization Follow-up   HPI [name] presents for seen in ER at [facility] 2 days ago for falling off of her bed. She rolled off when she was reaching over the side to pick up something. She felt a crack and could not bear weight and went to the emergency room at [facility]. She was found to have a nondisplaced fibular fracture of the distal fibula above the talotibial joint. She brings in a photocopy of the x-ray. She was given in the ER a prescription for 10 Percocets for pain and in 2 days as taken 9 of those. She states pain is 8/10. Moderate relief with the medication. She is looking for definitive care of the fracture.  History [name] has a past medical history of Hypertension; Diabetes mellitus; Arthritis; Blood transfusion (2007); PE (pulmonary embolism) (February 2007; March 2013); Anemia; COPD (chronic obstructive pulmonary disease) with emphysema (HCC); Depression; Anxiety; Gastroesophageal reflux disease; Osteoporosis; DDD (degenerative disc disease); Uterine prolapse; Asthma; History of CHF (congestive heart failure) (2007); Heart murmur; Hyperlipidemia; Hypothyroidism (acquired); Seizures (HCC); Headache(784.0); Heart palpitations; Chronic kidney disease; Myocardial infarction (HCC); Chronic pain; and Anxiety and depression, ( ? early dementia) (08/14/2011).   She has past surgical history that includes Hiatel Hernia Repair; Tubal ligation; Cardiac catheterization (March 2013); and left heart catheterization with coronary angiogram (N/A, 08/16/2011).   Her family history includes COPD in her sister; Cancer in her brother, daughter, father, and paternal uncle; Early death (age of onset: 40) in her brother; Goiter in her sister; Heart disease in her mother and sister; Liver cancer in her brother; Prostate cancer in her father; Stroke in her mother.She reports that she quit  smoking about 14 years ago. Her smoking use included Cigarettes. She smoked 2.00 packs per day. She has never used smokeless tobacco. She reports that she does not drink alcohol or use illicit drugs.  Current Outpatient Prescriptions on File Prior to Visit  Medication Sig Dispense Refill  . Cholecalciferol (VITAMIN D3) 5000 UNITS CAPS Take 5,000 Units by mouth daily.    . Choline Fenofibrate (FENOFIBRIC ACID) 135 MG CPDR TAKE 1 CAPSULE BY MOUTH DAILY. 30 capsule 3  . fish oil-omega-3 fatty acids 1000 MG capsule Take 1 g by mouth every morning.     . furosemide (LASIX) 40 MG tablet TAKE 1/2 TABLET BY MOUTH DAILY 15 tablet 3  . gabapentin (NEURONTIN) 300 MG capsule 1 po BID (Patient taking differently: Take 300 mg by mouth 2 (two) times daily. ) 60 capsule 5  . glimepiride (AMARYL) 2 MG tablet Take 1 tablet (2 mg total) by mouth daily before breakfast. 30 tablet 5  . glucose blood (ACCU-CHEK AVIVA) test strip Use to check blood glucose up to bid. Dx: E11.9 type 2 diabetes 100 each 2  . ipratropium-albuterol (DUONEB) 0.5-2.5 (3) MG/3ML SOLN Take 3 mLs by nebulization every 4 (four) hours as needed. For shortness of breath 360 mL 5  . Lancets (ACCU-CHEK SOFT TOUCH) lancets Use to check BG up to bid. Dx: E11.9 - type 2 DM 100 each 2  . levothyroxine (SYNTHROID, LEVOTHROID) 88 MCG tablet TAKE 1 TABLET BY MOUTH EVERY MORNING BEFORE BREAKFAST 30 tablet 5  . LORazepam (ATIVAN) 1 MG tablet Take 0.5 tablets (0.5 mg total) by mouth daily as needed for anxiety. 30 tablet 2  . losartan (COZAAR) 25 MG tablet TAKE 1 TABLET BY MOUTH  DAILY 30 tablet 5  . meclizine (ANTIVERT) 50 MG tablet Take 50 mg by mouth daily as needed for dizziness.     . metFORMIN (GLUCOPHAGE) 500 MG tablet TAKE 1 TABLET BY MOUTH TWICE DAILY WITH A MEAL. 60 tablet 3  . metoprolol succinate (TOPROL-XL) 25 MG 24 hr tablet Take 0.5 tablets (12.5 mg total) by mouth daily. 30 tablet 5  . oxybutynin (DITROPAN) 5 MG tablet Take 1 tablet (5 mg total)  by mouth 3 (three) times daily. 90 tablet 5  . pantoprazole (PROTONIX) 20 MG tablet Take 1 tablet (20 mg total) by mouth 2 (two) times daily. 60 tablet 4  . potassium chloride SA (K-DUR,KLOR-CON) 20 MEQ tablet TAKE 1 TABLET BY MOUTH EVERY MORNING 30 tablet 5  . PROAIR HFA 108 (90 BASE) MCG/ACT inhaler INHALE 2 PUFFS INTO THE LUNGS EVERY 6 HOURS AS NEEDED FOR SHORTNESS OF BREATH. 8.5 g 1  . simvastatin (ZOCOR) 40 MG tablet TAKE 1 TABLET BY MOUTH AT BEDTIME 30 tablet 5  . tiZANidine (ZANAFLEX) 4 MG tablet Take 1 tablet (4 mg total) by mouth every 6 (six) hours as needed for muscle spasms. 30 tablet 2  . warfarin (COUMADIN) 5 MG tablet Take 1/2 pill daily, take 1 whole pill on Monday and friday 30 tablet 5   No current facility-administered medications on file prior to visit.    ROS Review of Systems  Constitutional: Positive for activity change. Negative for fever and appetite change.  HENT: Negative for congestion.   Cardiovascular: Negative for chest pain.  Gastrointestinal: Positive for abdominal pain.  Genitourinary: Negative for dysuria.  Musculoskeletal: Positive for joint swelling and arthralgias. Negative for myalgias.    Objective:  BP 114/62 mmHg  Pulse 55  Temp(Src) 97.4 F (36.3 C) (Oral)  Ht  (1.575 m)  Wt 180 lb 8 oz (81.874 kg)  BMI 33.01 kg/m2  Physical Exam  Constitutional: She is oriented to person, place, and time. She appears well-developed and well-nourished.  HENT:  Head: Normocephalic.  Cardiovascular: Normal rate and regular rhythm.   No murmur heard. Pulmonary/Chest: Effort normal and breath sounds normal.  Musculoskeletal: She exhibits tenderness.  Patient has a posterior splint in place, short leg. Right lower extremity. There is moderate tenderness to palpation at the area of the fracture noted on the x-ray brought by the patient.  Neurological: She is alert and oriented to person, place, and time. No cranial nerve deficit.  Psychiatric: She has  a normal mood and affect.    Assessment & Plan:   [name] was seen today for hospitalization follow-up.  Diagnoses and all orders for this visit:  Fracture of fibula, right, closed, initial encounter -     Ambulatory referral to Orthopedics  Other orders -     oxyCODONE-acetaminophen (ROXICET) 5-325 MG tablet; Take 1 tablet by mouth every 4 (four) hours as needed for severe pain.  I am having Ms. Atienza start on oxyCODONE-acetaminophen. I am also having her maintain her fish oil-omega-3 fatty acids, meclizine, Vitamin D3, PROAIR HFA, ipratropium-albuterol, accu-chek soft touch, glucose blood, gabapentin, glimepiride, tiZANidine, warfarin, pantoprazole, metoprolol succinate, levothyroxine, potassium chloride SA, losartan, simvastatin, oxybutynin, LORazepam, metFORMIN, furosemide, Fenofibric Acid, HYDROcodone-acetaminophen, DULoxetine, and Doxepin HCl.  Meds ordered this encounter  Medications  . HYDROcodone-acetaminophen (NORCO/VICODIN) 5-325 MG tablet    Sig: TAKE 1 TABLET BY MOUTH EVERY FOUR HOURS AS NEEDED FOR PAIN ON RIGHT ANKLE    Refill:  0  . DULoxetine (CYMBALTA) 30 MG capsule  Sig:   . Doxepin HCl 5 % CREA    Sig:   . oxyCODONE-acetaminophen (ROXICET) 5-325 MG tablet    Sig: Take 1 tablet by mouth every 4 (four) hours as needed for severe pain.    Dispense:  30 tablet    Refill:  0     Follow-up: No Follow-up on file.  Mechele ClaudeWarren Hall Birchard, M.D.

## 2015-08-15 NOTE — Patient Instructions (Signed)
No weight bearing on the right leg until cleared by orthopedics.

## 2015-08-23 ENCOUNTER — Ambulatory Visit (INDEPENDENT_AMBULATORY_CARE_PROVIDER_SITE_OTHER): Payer: Medicare Other | Admitting: Pharmacist

## 2015-08-23 DIAGNOSIS — Z86711 Personal history of pulmonary embolism: Secondary | ICD-10-CM | POA: Diagnosis not present

## 2015-08-23 DIAGNOSIS — Z7901 Long term (current) use of anticoagulants: Secondary | ICD-10-CM | POA: Diagnosis not present

## 2015-08-23 LAB — COAGUCHEK XS/INR WAIVED
INR: 2.8 — AB (ref 0.9–1.1)
PROTHROMBIN TIME: 33.2 s

## 2015-08-23 NOTE — Progress Notes (Signed)
Patient ID: [name], female   DOB: [birth year], 70 y.o.   MRN: [id number]  Patient was 45 minutes late - only protime checked

## 2015-08-31 ENCOUNTER — Encounter: Payer: Self-pay | Admitting: Orthopaedic Surgery

## 2015-08-31 ENCOUNTER — Ambulatory Visit (INDEPENDENT_AMBULATORY_CARE_PROVIDER_SITE_OTHER): Payer: Medicare Other

## 2015-08-31 ENCOUNTER — Ambulatory Visit (INDEPENDENT_AMBULATORY_CARE_PROVIDER_SITE_OTHER): Payer: Medicare Other | Admitting: Orthopaedic Surgery

## 2015-08-31 VITALS — BP 119/82 | HR 54 | Temp 97.5°F | Ht 63.0 in | Wt 180.0 lb

## 2015-08-31 DIAGNOSIS — S8261XA Displaced fracture of lateral malleolus of right fibula, initial encounter for closed fracture: Secondary | ICD-10-CM | POA: Diagnosis not present

## 2015-08-31 DIAGNOSIS — S82891A Other fracture of right lower leg, initial encounter for closed fracture: Secondary | ICD-10-CM

## 2015-08-31 MED ORDER — ACETAMINOPHEN-CODEINE #3 300-30 MG PO TABS: ORAL_TABLET | ORAL | Status: DC

## 2015-09-01 ENCOUNTER — Encounter: Payer: Self-pay | Admitting: Orthopaedic Surgery

## 2015-09-01 DIAGNOSIS — S8261XA Displaced fracture of lateral malleolus of right fibula, initial encounter for closed fracture: Secondary | ICD-10-CM | POA: Insufficient documentation

## 2015-09-01 NOTE — Progress Notes (Signed)
Subjective: My right ankle is broken    Patient ID: [name], female    DOB: 12-10-1945, 70 y.o.   MRN: [id number]  Ankle Injury  The incident occurred more than 1 week ago. The incident occurred at home. The injury mechanism was an inversion injury. The pain is present in the left ankle. The quality of the pain is described as aching. The pain is at a severity of 3/10. The pain is mild. The pain has been improving since onset. Associated symptoms include an inability to bear weight and a loss of motion. Pertinent negatives include no loss of sensation, muscle weakness, numbness or tingling. The symptoms are aggravated by weight bearing. She has tried acetaminophen, elevation, immobilization, ice, non-weight bearing and rest for the symptoms. The treatment provided moderate relief.   She hurt her right ankle on 08-13-15 when she fell at her home.  She had pain in the right ankle laterally.  She was seen in the ER at [facility].  X-rays there showed a lateral malleolus fracture.  She was given a posterior splint.  She was given crutches.  She had no other injury.  She was subsequently seen at [facility].  She has a CAM walker but she did not like it and went back to the posterior splint. She was referred here by Ignacia Bayley.  She has no new trauma.  She has no redness.    Review of Systems  HENT: Negative for congestion.   Respiratory: Positive for cough and shortness of breath.   Cardiovascular: Negative for chest pain.  Endocrine: Positive for cold intolerance.  Musculoskeletal: Positive for joint swelling, arthralgias and gait problem.  Allergic/Immunologic: Negative for environmental allergies.  Neurological: Negative for tingling and numbness.  All other systems reviewed and are negative.  Past Medical History  Diagnosis Date  . Hypertension   . Diabetes mellitus   . Arthritis   . Blood transfusion 2007    History of  . PE (pulmonary embolism)  February 2007; March 2013    Bilateral PEs in 07; massive PE March 2013 with moderate elevation in PA pressures.  . Anemia   . COPD (chronic obstructive pulmonary disease) with emphysema (HCC)   . Depression   . Anxiety   . Gastroesophageal reflux disease   . Osteoporosis   . DDD (degenerative disc disease)   . Uterine prolapse   . Asthma   . History of CHF (congestive heart failure) 2007    2013: Normal EF by echo x2 with no significant diastolic dysfunction noted either  . Heart murmur     No significant valvular lesions on echo  . Hyperlipidemia   . Hypothyroidism (acquired)     On Levothyroxine  . Seizures (HCC)     once  . Headache(784.0)   . Heart palpitations   . Chronic kidney disease     lt kidney limited fxn  . Myocardial infarction (HCC)   . Chronic pain   . Anxiety and depression, ( ? early dementia) 08/14/2011   Past Surgical History  Procedure Laterality Date  . Hiatel hernia repair    . Tubal ligation    . Cardiac catheterization  March 2013    No evidence of coronary disease; also no significant aortic or iliac or renal artery disease.  . Left heart catheterization with coronary angiogram N/A 08/16/2011    Procedure: LEFT HEART CATHETERIZATION WITH CORONARY ANGIOGRAM;  Surgeon: Marykay Lex, MD;  Location: [facility] CATH LAB;  Service:  Cardiovascular;  Laterality: N/A;   Social History   Social History  . Marital Status: Married    Spouse Name: N/A  . Number of Children: N/A  . Years of Education: N/A   Occupational History  . Not on file.   Social History Main Topics  . Smoking status: Former Smoker -- 2.00 packs/day    Types: Cigarettes    Quit date: 03/23/2001  . Smokeless tobacco: Never Used  . Alcohol Use: No  . Drug Use: No  . Sexual Activity: No   Other Topics Concern  . Not on file   Social History Narrative   She is a married mother of 6, she has raised one 68 year old grandson. She was a former smoker of at least 50-pack-years but quit in  2006. She recently purchased an exercise bicycle, but is not been actively using it. She is trying to become more active. She denies any active alcohol use.   Her husband was recently diagnosed with urologic cancer, so she is somewhat "frazzled in her overall demeanor.)   Body mass index is 31.89 kg/(m^2). BP 119/82 mmHg  Pulse 54  Temp(Src) 97.5 F (36.4 C)  Ht 5\' 3"  (1.6 m)  Wt 180 lb (81.647 kg)  BMI 31.89 kg/m2     Objective:   Physical Exam  Constitutional: She is oriented to person, place, and time. She appears well-developed and well-nourished.  HENT:  Head: Normocephalic and atraumatic.  Eyes: Conjunctivae and EOM are normal. Pupils are equal, round, and reactive to light.  Neck: Normal range of motion. Neck supple.  Cardiovascular: Normal rate, regular rhythm and intact distal pulses.   Pulmonary/Chest: Effort normal.  Abdominal: Soft.  Musculoskeletal: She exhibits tenderness (Pain right lateral ankle with some swelling.  Decreased ROM of ankle,  No redness.  NV intact.).       Feet:  Neurological: She is alert and oriented to person, place, and time. She has normal reflexes. She displays normal reflexes. No cranial nerve deficit. She exhibits normal muscle tone. Coordination normal.  Skin: Skin is warm and dry.  Psychiatric: She has a normal mood and affect. Her behavior is normal. Judgment and thought content normal.          Assessment & Plan:   Encounter Diagnoses  Name Primary?  . Fracture of right ankle, lateral malleolus, closed, initial encounter   . Ankle fracture, right Yes   She has a CAM walker at home.  She is to resume this.  Instructions for use of Contrast Baths have been given.  She is to limit weight to the ankle and foot.  Call if any problems.  Return in three weeks with x-rays at that time.

## 2015-09-04 ENCOUNTER — Other Ambulatory Visit: Payer: Self-pay | Admitting: Nurse Practitioner

## 2015-09-04 ENCOUNTER — Other Ambulatory Visit: Payer: Self-pay | Admitting: Family Medicine

## 2015-09-21 ENCOUNTER — Ambulatory Visit: Payer: Medicare Other

## 2015-09-21 ENCOUNTER — Encounter: Payer: Self-pay | Admitting: Orthopaedic Surgery

## 2015-09-21 ENCOUNTER — Ambulatory Visit: Payer: Medicare Other | Admitting: Orthopaedic Surgery

## 2015-09-22 ENCOUNTER — Encounter: Payer: Self-pay | Admitting: Orthopaedic Surgery

## 2015-09-22 NOTE — Progress Notes (Signed)
No show

## 2015-09-27 ENCOUNTER — Ambulatory Visit (INDEPENDENT_AMBULATORY_CARE_PROVIDER_SITE_OTHER): Payer: Medicare Other | Admitting: Pharmacist

## 2015-09-27 ENCOUNTER — Encounter: Payer: Self-pay | Admitting: Pharmacist

## 2015-09-27 ENCOUNTER — Encounter (INDEPENDENT_AMBULATORY_CARE_PROVIDER_SITE_OTHER): Payer: Self-pay

## 2015-09-27 VITALS — BP 130/70 | HR 77 | Ht 63.0 in | Wt 180.0 lb

## 2015-09-27 DIAGNOSIS — Z86711 Personal history of pulmonary embolism: Secondary | ICD-10-CM | POA: Diagnosis not present

## 2015-09-27 DIAGNOSIS — E1121 Type 2 diabetes mellitus with diabetic nephropathy: Secondary | ICD-10-CM | POA: Diagnosis not present

## 2015-09-27 DIAGNOSIS — Z7901 Long term (current) use of anticoagulants: Secondary | ICD-10-CM | POA: Diagnosis not present

## 2015-09-27 DIAGNOSIS — E039 Hypothyroidism, unspecified: Secondary | ICD-10-CM | POA: Diagnosis not present

## 2015-09-27 LAB — COAGUCHEK XS/INR WAIVED
INR: 1.7 — ABNORMAL HIGH (ref 0.9–1.1)
Prothrombin Time: 20.7 s

## 2015-09-27 LAB — BAYER DCA HB A1C WAIVED: HB A1C (BAYER DCA - WAIVED): 6.2 % (ref <7.0)

## 2015-09-27 NOTE — Patient Instructions (Signed)
Anticoagulation Dose Instructions as of 09/27/2015      Monica SmilesSun Mon Tue Wed Thu Fri Sat   This week    5 mg  1 tablet 2.5 mg  1/2 tablet 5 mg  1 tablet 2.5 mg  1/2 tablet     Continue like this week  2.5 mg  1/2 tablet  5 mg  1 tablet  2.5 mg  1/2 tablet  2.5 mg  1/2 tablet  2.5 mg  1/2 tablet  5 mg  1 tablet  2.5 mg  1/2 tablet    Description        Take 1 whole tablet of warfarin 5mg  today - Wednesday, April 19th.  Then continue to take 1 tablet (5mg ) on Monday and Friday and 1/2 tablet (2.5mg ) every other day.

## 2015-09-27 NOTE — Progress Notes (Signed)
  Subjective:    [name] is a 70 y.o. female who presents for follow-up of Type 2 diabetes mellitus, recheck INR due to history of PE and to discuss her medications.    Diabetes:  Patient's last A1c was 7.4% (05/2016). She is taking glimepiride 2mg  qam and metformin 500mg  bid.  HGB readings per patient (did not brin gin glucometer) range from 100 to 200.  Known diabetic complications: nephropathy and cardiovascular disease Eye exam current (within one year): yes Weight trend: stable Prior visit with dietician: yes -   Current diet: in general, an "unhealthy" diet Current exercise: none and patient has right foot in boot / air cast  Chronic anticoagulation therapy - current warfarin dose is warfarin 5mg  - takes 1 tablet on mondays and fridays and 1/2 tablet all other days. Patient states that her husband has been helping with her medications but he has mentioned that he is not sure patient needs warfarin and she believes she has not gotten 1 or 2 doses over the last week.  Patient denies s/s of bleeding  Medication Problem:  As mentioned above patient nor her husband read well and he has been helping with her meds.  He believes she is taking took much medications.  It looks like there are some meds that she is not getting regularly such as simvastatin and pantoprazole.  Patient c/o myalgias also.   The following portions of the patient's history were reviewed and updated as appropriate: allergies, current medications, past family history, past medical history, past social history, past surgical history and problem list.    Objective:    BP 130/70 mmHg  Pulse 77  Ht 5\' 3"  (1.6 m)  Wt 180 lb (81.647 kg)  BMI 31.89 kg/m2  A1c - 6.2% today  Lab Review GLUCOSE (mg/dL)  Date Value  78/29/562102/16/2017 204*  02/09/2015 209*  11/09/2014 184*   GLUCOSE, BLD (mg/dL)  Date Value  30/86/578412/14/2016 165*  05/23/2015 136*  02/19/2015 267*   CO2 (mmol/L)  Date Value  07/27/2015 23  05/24/2015  31  05/23/2015 30   BUN (mg/dL)  Date Value  69/62/952802/16/2017 22  05/24/2015 27*  05/23/2015 32*  02/19/2015 24*  02/09/2015 26  11/09/2014 17   CREAT (mg/dL)  Date Value  41/32/440106/04/2013 1.14*   CREATININE, SER (mg/dL)  Date Value  02/72/536602/16/2017 1.09*  05/24/2015 1.11*  05/23/2015 1.35*   Assessment:    Diabetes Mellitus type II, under good control.   subtherapeutic anticoagulation - likely related to not taking as prescribed Medication Problem    Plan:    1.  Rx changes:  Stop fenofibrate to see if myalgias improve.   Restart simvastatin 40 mg qd - this is very important given her DM and CVD  Restart pantoprazoel 20mg  1 tablet qd - bid  Encouraged to remain off benzodiazepines and tizantidine 2.  Education: Reviewed 'ABCs' of diabetes management (respective goals in parentheses):  A1C (<7), blood pressure (<130/80), and cholesterol (LDL <100). 3.   Reviewed BG goal - patient o contineu to check BG QD 4.  Reviewed CHO serving sizes.  4. Follow up: 1 month  with PCP and 2 month with me.  Henrene Pastorammy Julieanna Geraci, PharmD, CPP

## 2015-09-28 ENCOUNTER — Ambulatory Visit: Payer: Medicare Other | Attending: Family Medicine | Admitting: Audiology

## 2015-09-28 LAB — CBC WITH DIFFERENTIAL/PLATELET
BASOS ABS: 0.1 10*3/uL (ref 0.0–0.2)
Basos: 1 %
EOS (ABSOLUTE): 0.1 10*3/uL (ref 0.0–0.4)
EOS: 2 %
HEMATOCRIT: 37.4 % (ref 34.0–46.6)
Hemoglobin: 12.1 g/dL (ref 11.1–15.9)
IMMATURE GRANULOCYTES: 1 %
Immature Grans (Abs): 0 10*3/uL (ref 0.0–0.1)
LYMPHS ABS: 2.4 10*3/uL (ref 0.7–3.1)
Lymphs: 37 %
MCH: 29.3 pg (ref 26.6–33.0)
MCHC: 32.4 g/dL (ref 31.5–35.7)
MCV: 91 fL (ref 79–97)
MONOCYTES: 9 %
Monocytes Absolute: 0.6 10*3/uL (ref 0.1–0.9)
NEUTROS PCT: 50 %
Neutrophils Absolute: 3.4 10*3/uL (ref 1.4–7.0)
Platelets: 288 10*3/uL (ref 150–379)
RBC: 4.13 x10E6/uL (ref 3.77–5.28)
RDW: 15.2 % (ref 12.3–15.4)
WBC: 6.5 10*3/uL (ref 3.4–10.8)

## 2015-09-28 LAB — THYROID PANEL WITH TSH
Free Thyroxine Index: 3.2 (ref 1.2–4.9)
T3 UPTAKE RATIO: 25 % (ref 24–39)
T4 TOTAL: 12.7 ug/dL — AB (ref 4.5–12.0)
TSH: 0.703 u[IU]/mL (ref 0.450–4.500)

## 2015-10-02 ENCOUNTER — Other Ambulatory Visit: Payer: Self-pay | Admitting: Family Medicine

## 2015-10-12 ENCOUNTER — Encounter: Payer: Self-pay | Admitting: *Deleted

## 2015-10-12 ENCOUNTER — Emergency Department (HOSPITAL_COMMUNITY)
Admission: EM | Admit: 2015-10-12 | Discharge: 2015-10-12 | Disposition: A | Discharge status: Home / Self Care | Payer: Medicare Other | Attending: Emergency Medicine | Admitting: Emergency Medicine

## 2015-10-12 ENCOUNTER — Encounter (HOSPITAL_COMMUNITY): Payer: Self-pay | Admitting: Emergency Medicine

## 2015-10-12 DIAGNOSIS — N189 Chronic kidney disease, unspecified: Secondary | ICD-10-CM | POA: Insufficient documentation

## 2015-10-12 DIAGNOSIS — E1122 Type 2 diabetes mellitus with diabetic chronic kidney disease: Secondary | ICD-10-CM | POA: Insufficient documentation

## 2015-10-12 DIAGNOSIS — M810 Age-related osteoporosis without current pathological fracture: Secondary | ICD-10-CM | POA: Insufficient documentation

## 2015-10-12 DIAGNOSIS — K149 Disease of tongue, unspecified: Secondary | ICD-10-CM

## 2015-10-12 DIAGNOSIS — F329 Major depressive disorder, single episode, unspecified: Secondary | ICD-10-CM | POA: Insufficient documentation

## 2015-10-12 DIAGNOSIS — K140 Glossitis: Secondary | ICD-10-CM | POA: Insufficient documentation

## 2015-10-12 DIAGNOSIS — J449 Chronic obstructive pulmonary disease, unspecified: Secondary | ICD-10-CM | POA: Insufficient documentation

## 2015-10-12 DIAGNOSIS — E785 Hyperlipidemia, unspecified: Secondary | ICD-10-CM | POA: Insufficient documentation

## 2015-10-12 DIAGNOSIS — J45909 Unspecified asthma, uncomplicated: Secondary | ICD-10-CM | POA: Insufficient documentation

## 2015-10-12 DIAGNOSIS — I252 Old myocardial infarction: Secondary | ICD-10-CM | POA: Insufficient documentation

## 2015-10-12 DIAGNOSIS — E039 Hypothyroidism, unspecified: Secondary | ICD-10-CM | POA: Insufficient documentation

## 2015-10-12 DIAGNOSIS — Z87891 Personal history of nicotine dependence: Secondary | ICD-10-CM | POA: Diagnosis not present

## 2015-10-12 DIAGNOSIS — I129 Hypertensive chronic kidney disease with stage 1 through stage 4 chronic kidney disease, or unspecified chronic kidney disease: Secondary | ICD-10-CM | POA: Insufficient documentation

## 2015-10-12 DIAGNOSIS — K148 Other diseases of tongue: Secondary | ICD-10-CM | POA: Diagnosis not present

## 2015-10-12 NOTE — ED Provider Notes (Signed)
CSN: [id number]     Arrival date & time 10/12/15  1345 History   First MD Initiated Contact with Patient 10/12/15 1416     Chief Complaint  Patient presents with  . tongue red      (Consider location/radiation/quality/duration/timing/severity/associated sxs/prior Treatment) HPI   [name] is a 70 y.o. female who presents to the Emergency Department complaining of her tongue being red.  She states that she noticed that her tongue was excessively red shortly before ED arrival.  She also states that her mouth stays dry, but admits to taking a lot of routine medications, including synthroid.  She denies pain, swelling, sore throat, difficulty swallowing or shortness of breath. No hx of anemia   Past Medical History  Diagnosis Date  . Hypertension   . Diabetes mellitus   . Arthritis   . Blood transfusion 2007    History of  . PE (pulmonary embolism) February 2007; March 2013    Bilateral PEs in 07; massive PE March 2013 with moderate elevation in PA pressures.  . Anemia   . COPD (chronic obstructive pulmonary disease) with emphysema (HCC)   . Depression   . Anxiety   . Gastroesophageal reflux disease   . Osteoporosis   . DDD (degenerative disc disease)   . Uterine prolapse   . Asthma   . History of CHF (congestive heart failure) 2007    2013: Normal EF by echo x2 with no significant diastolic dysfunction noted either  . Heart murmur     No significant valvular lesions on echo  . Hyperlipidemia   . Hypothyroidism (acquired)     On Levothyroxine  . Seizures (HCC)     once  . Headache(784.0)   . Heart palpitations   . Chronic kidney disease     lt kidney limited fxn  . Myocardial infarction (HCC)   . Chronic pain   . Anxiety and depression, ( ? early dementia) 08/14/2011   Past Surgical History  Procedure Laterality Date  . Hiatel hernia repair    . Tubal ligation    . Cardiac catheterization  March 2013    No evidence of coronary disease; also no significant  aortic or iliac or renal artery disease.  . Left heart catheterization with coronary angiogram N/A 08/16/2011    Procedure: LEFT HEART CATHETERIZATION WITH CORONARY ANGIOGRAM;  Surgeon: Marykay Lex, MD;  Location: [facility] CATH LAB;  Service: Cardiovascular;  Laterality: N/A;   Family History  Problem Relation Age of Onset  . Heart disease Mother   . Stroke Mother   . Prostate cancer Father   . Cancer Father     bone  . Liver cancer Brother   . Alzheimer's disease      family history  . Cancer Daughter     breast  . Cancer Paternal Uncle     unsure of what kind  . COPD Sister   . Heart disease Sister   . Goiter Sister   . Early death Brother 8  . Cancer Brother     leukemia   Social History  Substance Use Topics  . Smoking status: Former Smoker -- 2.00 packs/day    Types: Cigarettes    Quit date: 03/23/2001  . Smokeless tobacco: Never Used  . Alcohol Use: No   OB History    No data available     Review of Systems  Constitutional: Negative for fever, chills and fatigue.  HENT: Negative for dental problem, facial swelling, mouth sores, sore  throat, trouble swallowing and voice change.        Red tongue  Respiratory: Negative for cough, shortness of breath and wheezing.   Cardiovascular: Negative for chest pain and palpitations.  Gastrointestinal: Negative for nausea, vomiting and abdominal pain.  Genitourinary: Negative for dysuria and hematuria.  Musculoskeletal: Negative for myalgias, back pain, arthralgias, neck pain and neck stiffness.  Skin: Negative for rash.  Neurological: Negative for dizziness, weakness and numbness.  Hematological: Does not bruise/bleed easily.      Allergies  Procaine hcl; Codeine; Latex; and Penicillins  Home Medications   Prior to Admission medications   Medication Sig Start Date End Date Taking? Authorizing Provider  acetaminophen-codeine (TYLENOL #3) 300-30 MG tablet One tablet every four hours as needed for pain.  Must last TEN  days. 08/31/15   Darreld McleanWayne Keeling, MD  Cholecalciferol (VITAMIN D3) 5000 UNITS CAPS Take 5,000 Units by mouth daily.    Historical Provider, MD  Doxepin HCl 5 % CREA  08/04/15   Historical Provider, MD  DULoxetine (CYMBALTA) 30 MG capsule TAKE 1 CAPSULE BY MOUTH EVERY DAY 09/04/15   Elenora GammaSamuel L Bradshaw, MD  fish oil-omega-3 fatty acids 1000 MG capsule Take 1 g by mouth every morning. Reported on 09/27/2015    Historical Provider, MD  furosemide (LASIX) 40 MG tablet TAKE 1/2 TABLET BY MOUTH DAILY 08/03/15   Elenora GammaSamuel L Bradshaw, MD  gabapentin (NEURONTIN) 300 MG capsule TAKE 1 CAPSULE BY MOUTH TWICE DAILY 09/04/15   Elenora GammaSamuel L Bradshaw, MD  glimepiride (AMARYL) 2 MG tablet Take 1 tablet (2 mg total) by mouth daily before breakfast. 02/09/15   Mary-Margaret Daphine DeutscherMartin, FNP  glucose blood (ACCU-CHEK AVIVA) test strip Use to check blood glucose up to bid. Dx: E11.9 type 2 diabetes 11/03/14   Ernestina Pennaonald W Moore, MD  Lancets (ACCU-CHEK SOFT [facility]) lancets Use to check BG up to bid. Dx: E11.9 - type 2 DM 11/03/14   Ernestina Pennaonald W Moore, MD  levothyroxine (SYNTHROID, LEVOTHROID) 88 MCG tablet TAKE 1 TABLET BY MOUTH EVERY MORNING BEFORE BREAKFAST 07/04/15   Elenora GammaSamuel L Bradshaw, MD  losartan (COZAAR) 25 MG tablet TAKE 1 TABLET BY MOUTH DAILY 07/04/15   Elenora GammaSamuel L Bradshaw, MD  meclizine (ANTIVERT) 50 MG tablet Take 50 mg by mouth daily as needed for dizziness. Reported on 09/27/2015    Historical Provider, MD  metFORMIN (GLUCOPHAGE) 500 MG tablet TAKE 1 TABLET BY MOUTH TWICE DAILY WITH A MEAL. 08/03/15   Elenora GammaSamuel L Bradshaw, MD  metoprolol succinate (TOPROL-XL) 25 MG 24 hr tablet Take 0.5 tablets (12.5 mg total) by mouth daily. 05/29/15   Elenora GammaSamuel L Bradshaw, MD  oxybutynin (DITROPAN) 5 MG tablet Take 1 tablet (5 mg total) by mouth 3 (three) times daily. Patient not taking: Reported on 09/27/2015 07/05/15   Elenora GammaSamuel L Bradshaw, MD  pantoprazole (PROTONIX) 20 MG tablet TAKE 1 TABLET BY MOUTH TWICE DAILY 10/02/15   Elenora GammaSamuel L Bradshaw, MD  potassium chloride SA  (K-DUR,KLOR-CON) 20 MEQ tablet TAKE 1 TABLET BY MOUTH EVERY MORNING 07/04/15   Elenora GammaSamuel L Bradshaw, MD  PROAIR HFA 108 (90 BASE) MCG/ACT inhaler INHALE 2 PUFFS INTO THE LUNGS EVERY 6 HOURS AS NEEDED FOR SHORTNESS OF BREATH. 06/06/14   Mary-Margaret Daphine DeutscherMartin, FNP  simvastatin (ZOCOR) 40 MG tablet TAKE 1 TABLET BY MOUTH AT BEDTIME 07/04/15   Elenora GammaSamuel L Bradshaw, MD  warfarin (COUMADIN) 5 MG tablet Take 1/2 pill daily, take 1 whole pill on Monday and friday 05/01/15   Elenora GammaSamuel L Bradshaw, MD   BP  154/52 mmHg  Pulse 60  Temp(Src) 97.1 F (36.2 C) (Oral)  Resp 16  Ht 5\' 3"  (1.6 m)  Wt 81.647 kg  BMI 31.89 kg/m2  SpO2 99% Physical Exam  Constitutional: She is oriented to person, place, and time. She appears well-developed and well-nourished. No distress.  HENT:  Head: Normocephalic and atraumatic.  Right Ear: Tympanic membrane and ear canal normal.  Left Ear: Tympanic membrane and ear canal normal.  Nose: No mucosal edema.  Mouth/Throat: Uvula is midline and oropharynx is clear and moist. Mucous membranes are dry. No oral lesions. No trismus in the jaw. No uvula swelling.  Firey red tongue.  No edema.  Airway patent.  No lesions  Eyes: Pupils are equal, round, and reactive to light.  Neck: Normal range of motion.  Cardiovascular: Normal rate and regular rhythm.   Pulmonary/Chest: Effort normal and breath sounds normal. No respiratory distress.  Musculoskeletal: Normal range of motion.  Neurological: She is alert and oriented to person, place, and time.  Skin: Skin is warm. No rash noted.  Nursing note and vitals reviewed.   ED Course  Procedures (including critical care time) Labs Review Labs Reviewed - No data to display  Imaging Review No results found. I have personally reviewed and evaluated these images and lab results as part of my medical decision-making.   EKG Interpretation None      MDM   Final diagnoses:  Red tongue   Pt is well appearing.  Vitals stable.  Airway is  patent.  No facial edema or edema of the tongue or lips. No concerning sx's for emergent intervention.  Discussed with patient the need for close PMD f/u      Pauline Aus, PA-C 10/14/15 2251  Zadie Rhine, MD 10/15/15 [phone]

## 2015-10-12 NOTE — ED Notes (Signed)
C/o tongue being red.  Have been eating orange jello with fruits.  Denies any other problems.

## 2015-10-26 ENCOUNTER — Encounter: Payer: Self-pay | Admitting: Pharmacist

## 2015-10-26 ENCOUNTER — Ambulatory Visit (INDEPENDENT_AMBULATORY_CARE_PROVIDER_SITE_OTHER): Payer: Medicare Other | Admitting: Pharmacist

## 2015-10-26 DIAGNOSIS — Z86711 Personal history of pulmonary embolism: Secondary | ICD-10-CM | POA: Diagnosis not present

## 2015-10-26 DIAGNOSIS — Z7901 Long term (current) use of anticoagulants: Secondary | ICD-10-CM

## 2015-10-26 LAB — COAGUCHEK XS/INR WAIVED
INR: 2 — ABNORMAL HIGH (ref 0.9–1.1)
PROTHROMBIN TIME: 23.8 s

## 2015-10-26 NOTE — Progress Notes (Signed)
  Subjective:    [name] is a 70 y.o. female who presents for recheck INR due to history of PE and to discuss her medications.    Diabetes:  Patient's last A1c was 6.2% (09/27/2015). She is taking glimepiride 2mg  qam and metformin 500mg  bid.  HGB readings - (did not bring in glucometer) Known diabetic complications: nephropathy and cardiovascular disease Eye exam current (within one year): yes  Chronic anticoagulation therapy - current warfarin dose is warfarin 5mg  - takes 1 tablet on mondays and fridays and 1/2 tablet all other days.   Medication Problem:  Myalgias have improved since fenofibrate was discontinue.  Patient reports that he husband is not going by medication list printed at appointments.   The following portions of the patient's history were reviewed and updated as appropriate: allergies, current medications, past family history, past medical history, past social history, past surgical history and problem list.    Objective:    There were no vitals taken for this visit.  A1c - 6.2% (09/27/2015)  Lab Review GLUCOSE (mg/dL)  Date Value  16/10/960402/16/2017 204*  02/09/2015 209*  11/09/2014 184*   GLUCOSE, BLD (mg/dL)  Date Value  54/09/811912/14/2016 165*  05/23/2015 136*  02/19/2015 267*   CO2 (mmol/L)  Date Value  07/27/2015 23  05/24/2015 31  05/23/2015 30   BUN (mg/dL)  Date Value  14/78/295602/16/2017 22  05/24/2015 27*  05/23/2015 32*  02/19/2015 24*  02/09/2015 26  11/09/2014 17   CREAT (mg/dL)  Date Value  21/30/865706/04/2013 1.14*   CREATININE, SER (mg/dL)  Date Value  84/69/629502/16/2017 1.09*  05/24/2015 1.11*  05/23/2015 1.35*   Assessment:    Therapeutic anticoagulation  Medication compliance - improved since last visit HTN - BP at goal today   Plan:    1.  Rx changes: none - continue with changes made at last appt 2.   Anticoagulation Dose Instructions as of 10/26/2015      Glynis SmilesSun Mon Tue Wed Thu Fri Sat   New Dose 2.5 mg 5 mg 2.5 mg 2.5 mg 2.5 mg 5 mg 2.5  mg    Description        Continue warfarin 5mg  tablets -  take 1 tablet (5mg ) on Monday and Friday and 1/2 tablet (2.5mg ) every other day.      3.  Follow up: 1 month  with PCP and 2 month with me.  Henrene Pastorammy Juventino Pavone, PharmD, CPP

## 2015-10-26 NOTE — Patient Instructions (Signed)
Anticoagulation Dose Instructions as of 10/26/2015      Monica SmilesSun Mon Tue Wed Thu Fri Sat   New Dose 2.5 mg 5 mg 2.5 mg 2.5 mg 2.5 mg 5 mg 2.5 mg    Description        Continue warfarin 5mg  tablets -  take 1 tablet (5mg ) on Monday and Friday and 1/2 tablet (2.5mg ) every other day.     INR was 2.0 today

## 2015-11-07 ENCOUNTER — Encounter (INDEPENDENT_AMBULATORY_CARE_PROVIDER_SITE_OTHER): Payer: Medicare Other | Admitting: Family Medicine

## 2015-11-07 ENCOUNTER — Encounter: Payer: Medicare Other | Admitting: *Deleted

## 2015-11-07 NOTE — Progress Notes (Signed)
   Subjective:  Patient ID: [name], female    DOB: 26-Nov-1945  Age: 70 y.o. MRN: [id number]  CC: erroneous  ROS Review of Systems    Objective:  There were no vitals taken for this visit.  Physical Exam

## 2015-11-08 ENCOUNTER — Encounter: Payer: Self-pay | Admitting: Family Medicine

## 2015-12-01 ENCOUNTER — Other Ambulatory Visit: Payer: Self-pay | Admitting: Family Medicine

## 2015-12-07 ENCOUNTER — Ambulatory Visit (INDEPENDENT_AMBULATORY_CARE_PROVIDER_SITE_OTHER): Payer: Medicare Other | Admitting: Pharmacist

## 2015-12-07 ENCOUNTER — Encounter: Payer: Self-pay | Admitting: Pharmacist

## 2015-12-07 VITALS — BP 160/72 | HR 60 | Ht 62.5 in | Wt 187.0 lb

## 2015-12-07 DIAGNOSIS — Z0000 Encounter for general adult medical examination without abnormal findings: Secondary | ICD-10-CM | POA: Diagnosis not present

## 2015-12-07 DIAGNOSIS — E1121 Type 2 diabetes mellitus with diabetic nephropathy: Secondary | ICD-10-CM | POA: Diagnosis not present

## 2015-12-07 DIAGNOSIS — Z86711 Personal history of pulmonary embolism: Secondary | ICD-10-CM | POA: Diagnosis not present

## 2015-12-07 DIAGNOSIS — Z7901 Long term (current) use of anticoagulants: Secondary | ICD-10-CM | POA: Diagnosis not present

## 2015-12-07 LAB — COAGUCHEK XS/INR WAIVED
INR: 2.7 — AB (ref 0.9–1.1)
PROTHROMBIN TIME: 32.8 s

## 2015-12-07 NOTE — Patient Instructions (Addendum)
Anticoagulation Dose Instructions as of 12/07/2015      [name] Tue Wed Thu Fri Sat   New Dose 2.5 mg 5 mg 2.5 mg 2.5 mg 2.5 mg 5 mg 2.5 mg    Description        Continue warfarin 432m tablets -  take 1 tablet (548m on Monday and Friday and 1/2 tablet (2.32m56mevery other day.     INR was 2.7 today   [name] , Thank you for taking time to come for your Medicare Wellness Visit. I appreciate your ongoing commitment to your health goals. Please review the following plan we discussed and let me know if I can assist you in the future.   These are the goals we discussed:  You blood pressure what high today.  Goal is less than 140 / 90.  Please verify that you are taking all your blood pressure medications (highlighted on list).  We will have you return to office to recheck blood pressure in 1-2 weeks.   Recommend limit salt intake - no added salt to food, limit processed meats - Spam, sausage, bacon, lunch meats.  Try to choose fresh or frozen vegetables instead of canned.  Diabetic eye exam made for Monday, July 10th at 1:20pm [facility] .[phone]    This is a list of the screening recommended for you and due dates:  Health Maintenance  Topic Date Due  .  Hepatitis C: One time screening is recommended by Center for Disease Control  (CDC) for  adults born from [birth year]rough 1965.   01/17/06/1947will get with next blood draw  . Tetanus Vaccine  02/08/1967 - Cost is $8.25  . Mammogram  02/07/1998 - appointment made for 03/04/2016  . Shingles Vaccine  02/08/2008 - Cost is $8.25  . Eye exam for diabetics  12/16/2015  . Flu Shot  01/09/2016  . Complete foot exam   02/09/2016  . Hemoglobin A1C  03/28/2016  . Pneumonia vaccines (2 of 2 - PPSV23) 07/18/2016  . DEXA scan (bone density measurement)  11/29/2016  . Colon Cancer Screening  08/13/2021   Health Maintenance, Female Adopting a healthy lifestyle and getting preventive care can go a long way to  promote health and wellness. Talk with your health care provider about what schedule of regular examinations is right for you. This is a good chance for you to check in with your provider about disease prevention and staying healthy. In between checkups, there are plenty of things you can do on your own. Experts have done a lot of research about which lifestyle changes and preventive measures are most likely to keep you healthy. Ask your health care provider for more information. WEIGHT AND DIET  Eat a healthy diet  Be sure to include plenty of vegetables, fruits, low-fat dairy products, and lean protein.  Do not eat a lot of foods high in solid fats, added sugars, or salt.  Get regular exercise. This is one of the most important things you can do for your health.  Most adults should exercise for at least 150 minutes each week. The exercise should increase your heart rate and make you sweat (moderate-intensity exercise).  Most adults should also do strengthening exercises at least twice a week. This is in addition to the moderate-intensity exercise.  Maintain a healthy weight  Body mass index (BMI) is a measurement that can be used to identify possible weight problems. It estimates body fat  based on height and weight. Your health care provider can help determine your BMI and help you achieve or maintain a healthy weight.  For females 65 years of age and older:   A BMI below 18.5 is considered underweight.  A BMI of 18.5 to 24.9 is normal.  A BMI of 25 to 29.9 is considered overweight.  A BMI of 30 and above is considered obese.  Watch levels of cholesterol and blood lipids  You should start having your blood tested for lipids and cholesterol at 68 years of age, then have this test every 5 years.  You may need to have your cholesterol levels checked more often if:  Your lipid or cholesterol levels are high.  You are older than 68 years of age.  You are at high risk for heart  disease.  CANCER SCREENING   Lung Cancer  Lung cancer screening is recommended for adults 25-52 years old who are at high risk for lung cancer because of a history of smoking.  A yearly low-dose CT scan of the lungs is recommended for people who:  Currently smoke.  Have quit within the past 15 years.  Have at least a 30-pack-year history of smoking. A pack year is smoking an average of one pack of cigarettes a day for 1 year.  Yearly screening should continue until it has been 15 years since you quit.  Yearly screening should stop if you develop a health problem that would prevent you from having lung cancer treatment.  Breast Cancer  Practice breast self-awareness. This means understanding how your breasts normally appear and feel.  It also means doing regular breast self-exams. Let your health care provider know about any changes, no matter how small.  If you are in your 20s or 30s, you should have a clinical breast exam (CBE) by a health care provider every 1-3 years as part of a regular health exam.  If you are 19 or older, have a CBE every year. Also consider having a breast X-ray (mammogram) every year.  If you have a family history of breast cancer, talk to your health care provider about genetic screening.  If you are at high risk for breast cancer, talk to your health care provider about having an MRI and a mammogram every year.  Breast cancer gene (BRCA) assessment is recommended for women who have family members with BRCA-related cancers. BRCA-related cancers include:  Breast.  Ovarian.  Tubal.  Peritoneal cancers.  Results of the assessment will determine the need for genetic counseling and BRCA1 and BRCA2 testing. Cervical Cancer Your health care provider may recommend that you be screened regularly for cancer of the pelvic organs (ovaries, uterus, and vagina). This screening involves a pelvic examination, including checking for microscopic changes to the  surface of your cervix (Pap test). You may be encouraged to have this screening done every 3 years, beginning at age 52.  For women ages 101-65, health care providers may recommend pelvic exams and Pap testing every 3 years, or they may recommend the Pap and pelvic exam, combined with testing for human papilloma virus (HPV), every 5 years. Some types of HPV increase your risk of cervical cancer. Testing for HPV may also be done on women of any age with unclear Pap test results.  Other health care providers may not recommend any screening for nonpregnant women who are considered low risk for pelvic cancer and who do not have symptoms. Ask your health care provider if a screening pelvic  exam is right for you.  If you have had past treatment for cervical cancer or a condition that could lead to cancer, you need Pap tests and screening for cancer for at least 20 years after your treatment. If Pap tests have been discontinued, your risk factors (such as having a new sexual partner) need to be reassessed to determine if screening should resume. Some women have medical problems that increase the chance of getting cervical cancer. In these cases, your health care provider may recommend more frequent screening and Pap tests. Colorectal Cancer  This type of cancer can be detected and often prevented.  Routine colorectal cancer screening usually begins at 68 years of age and continues through 68 years of age.  Your health care provider may recommend screening at an earlier age if you have risk factors for colon cancer.  Your health care provider may also recommend using home test kits to check for hidden blood in the stool.  A small camera at the end of a tube can be used to examine your colon directly (sigmoidoscopy or colonoscopy). This is done to check for the earliest forms of colorectal cancer.  Routine screening usually begins at age 64.  Direct examination of the colon should be repeated every 5-10  years through 68 years of age. However, you may need to be screened more often if early forms of precancerous polyps or small growths are found. Skin Cancer  Check your skin from head to toe regularly.  Tell your health care provider about any new moles or changes in moles, especially if there is a change in a mole's shape or color.  Also tell your health care provider if you have a mole that is larger than the size of a pencil eraser.  Always use sunscreen. Apply sunscreen liberally and repeatedly throughout the day.  Protect yourself by wearing long sleeves, pants, a wide-brimmed hat, and sunglasses whenever you are outside. HEART DISEASE, DIABETES, AND HIGH BLOOD PRESSURE   High blood pressure causes heart disease and increases the risk of stroke. High blood pressure is more likely to develop in:  People who have blood pressure in the high end of the normal range (130-139/85-89 mm Hg).  People who are overweight or obese.  People who are African American.  If you are 64-19 years of age, have your blood pressure checked every 3-5 years. If you are 33 years of age or older, have your blood pressure checked every year. You should have your blood pressure measured twice--once when you are at a hospital or clinic, and once when you are not at a hospital or clinic. Record the average of the two measurements. To check your blood pressure when you are not at a hospital or clinic, you can use:  An automated blood pressure machine at a pharmacy.  A home blood pressure monitor.  If you are between 77 years and 55 years old, ask your health care provider if you should take aspirin to prevent strokes.  Have regular diabetes screenings. This involves taking a blood sample to check your fasting blood sugar level.  If you are at a normal weight and have a low risk for diabetes, have this test once every three years after 68 years of age.  If you are overweight and have a high risk for diabetes,  consider being tested at a younger age or more often. PREVENTING INFECTION  Hepatitis B  If you have a higher risk for hepatitis B, you should be  screened for this virus. You are considered at high risk for hepatitis B if:  You were born in a country where hepatitis B is common. Ask your health care provider which countries are considered high risk.  Your parents were born in a high-risk country, and you have not been immunized against hepatitis B (hepatitis B vaccine).  You have HIV or AIDS.  You use needles to inject street drugs.  You live with someone who has hepatitis B.  You have had sex with someone who has hepatitis B.  You get hemodialysis treatment.  You take certain medicines for conditions, including cancer, organ transplantation, and autoimmune conditions. Hepatitis C  Blood testing is recommended for:  Everyone born from [birth year] through 1963.  Anyone with known risk factors for hepatitis C. Sexually transmitted infections (STIs)  You should be screened for sexually transmitted infections (STIs) including gonorrhea and chlamydia if:  You are sexually active and are younger than 68 years of age.  You are older than 68 years of age and your health care provider tells you that you are at risk for this type of infection.  Your sexual activity has changed since you were last screened and you are at an increased risk for chlamydia or gonorrhea. Ask your health care provider if you are at risk.  If you do not have HIV, but are at risk, it may be recommended that you take a prescription medicine daily to prevent HIV infection. This is called pre-exposure prophylaxis (PrEP). You are considered at risk if:  You are sexually active and do not regularly use condoms or know the HIV status of your partner(s).  You take drugs by injection.  You are sexually active with a partner who has HIV. Talk with your health care provider about whether you are at high risk of being  infected with HIV. If you choose to begin PrEP, you should first be tested for HIV. You should then be tested every 3 months for as long as you are taking PrEP.  PREGNANCY   If you are premenopausal and you may become pregnant, ask your health care provider about preconception counseling.  If you may become pregnant, take 400 to 800 micrograms (mcg) of folic acid every day.  If you want to prevent pregnancy, talk to your health care provider about birth control (contraception). OSTEOPOROSIS AND MENOPAUSE   Osteoporosis is a disease in which the bones lose minerals and strength with aging. This can result in serious bone fractures. Your risk for osteoporosis can be identified using a bone density scan.  If you are 22 years of age or older, or if you are at risk for osteoporosis and fractures, ask your health care provider if you should be screened.  Ask your health care provider whether you should take a calcium or vitamin D supplement to lower your risk for osteoporosis.  Menopause may have certain physical symptoms and risks.  Hormone replacement therapy may reduce some of these symptoms and risks. Talk to your health care provider about whether hormone replacement therapy is right for you.  HOME CARE INSTRUCTIONS   Schedule regular health, dental, and eye exams.  Stay current with your immunizations.   Do not use any tobacco products including cigarettes, chewing tobacco, or electronic cigarettes.  If you are pregnant, do not drink alcohol.  If you are breastfeeding, limit how much and how often you drink alcohol.  Limit alcohol intake to no more than 1 drink per day for nonpregnant women.  One drink equals 12 ounces of beer, 5 ounces of wine, or 1 ounces of hard liquor.  Do not use street drugs.  Do not share needles.  Ask your health care provider for help if you need support or information about quitting drugs.  Tell your health care provider if you often feel  depressed.  Tell your health care provider if you have ever been abused or do not feel safe at home.   This information is not intended to replace advice given to you by your health care provider. Make sure you discuss any questions you have with your health care provider.   Document Released: 12/10/2010 Document Revised: 06/17/2014 Document Reviewed: 04/28/2013 Elsevier Interactive Patient Education 2016 [place] DASH stands for "Dietary Approaches to Stop Hypertension." The DASH eating plan is a healthy eating plan that has been shown to reduce high blood pressure (hypertension). Additional health benefits may include reducing the risk of type 2 diabetes mellitus, heart disease, and stroke. The DASH eating plan may also help with weight loss. WHAT DO I NEED TO KNOW ABOUT THE DASH EATING PLAN? For the DASH eating plan, you will follow these general guidelines:  Choose foods with a percent daily value for sodium of less than 5% (as listed on the food label).  Use salt-free seasonings or herbs instead of table salt or sea salt.  Check with your health care provider or pharmacist before using salt substitutes.  Eat lower-sodium products, often labeled as "lower sodium" or "no salt added."  Eat fresh foods.  Eat more vegetables, fruits, and low-fat dairy products.  Choose whole grains. Look for the word "whole" as the first word in the ingredient list.  Choose fish and skinless chicken or [place] more often than red meat. Limit fish, poultry, and meat to 6 oz (170 g) each day.  Limit sweets, desserts, sugars, and sugary drinks.  Choose heart-healthy fats.  Limit cheese to 1 oz (28 g) per day.  Eat more home-cooked food and less restaurant, buffet, and fast food.  Limit fried foods.  Cook foods using methods other than frying.  Limit canned vegetables. If you do use them, rinse them well to decrease the sodium.  When eating at a restaurant, ask that your  food be prepared with less salt, or no salt if possible. WHAT FOODS CAN I EAT? Seek help from a dietitian for individual calorie needs. Grains Whole grain or whole wheat bread. Brown rice. Whole grain or whole wheat pasta. Quinoa, bulgur, and whole grain cereals. Low-sodium cereals. Corn or whole wheat flour tortillas. Whole grain cornbread. Whole grain crackers. Low-sodium crackers. Vegetables Fresh or frozen vegetables (raw, steamed, roasted, or grilled). Low-sodium or reduced-sodium tomato and vegetable juices. Low-sodium or reduced-sodium tomato sauce and paste. Low-sodium or reduced-sodium canned vegetables.  Fruits All fresh, canned (in natural juice), or frozen fruits. Meat and Other Protein Products Ground beef (85% or leaner), grass-fed beef, or beef trimmed of fat. Skinless chicken or [place]. Ground chicken or [place]. Pork trimmed of fat. All fish and seafood. Eggs. Dried beans, peas, or lentils. Unsalted nuts and seeds. Unsalted canned beans. Dairy Low-fat dairy products, such as skim or 1% milk, 2% or reduced-fat cheeses, low-fat ricotta or cottage cheese, or plain low-fat yogurt. Low-sodium or reduced-sodium cheeses. Fats and Oils Tub margarines without trans fats. Light or reduced-fat mayonnaise and salad dressings (reduced sodium). Avocado. Safflower, olive, or canola oils. Natural peanut or almond butter. Other Unsalted popcorn and pretzels. The items  listed above may not be a complete list of recommended foods or beverages. Contact your dietitian for more options. WHAT FOODS ARE NOT RECOMMENDED? Grains White bread. White pasta. White rice. Refined cornbread. Bagels and croissants. Crackers that contain trans fat. Vegetables Creamed or fried vegetables. Vegetables in a cheese sauce. Regular canned vegetables. Regular canned tomato sauce and paste. Regular tomato and vegetable juices. Fruits Dried fruits. Canned fruit in light or heavy syrup. Fruit juice. Meat and Other  Protein Products Fatty cuts of meat. Ribs, chicken wings, bacon, sausage, bologna, salami, chitterlings, fatback, hot dogs, bratwurst, and packaged luncheon meats. Salted nuts and seeds. Canned beans with salt. Dairy Whole or 2% milk, cream, half-and-half, and cream cheese. Whole-fat or sweetened yogurt. Full-fat cheeses or blue cheese. Nondairy creamers and whipped toppings. Processed cheese, cheese spreads, or cheese curds. Condiments Onion and garlic salt, seasoned salt, table salt, and sea salt. Canned and packaged gravies. Worcestershire sauce. Tartar sauce. Barbecue sauce. Teriyaki sauce. Soy sauce, including reduced sodium. Steak sauce. Fish sauce. Oyster sauce. Cocktail sauce. Horseradish. Ketchup and mustard. Meat flavorings and tenderizers. Bouillon cubes. Hot sauce. Tabasco sauce. Marinades. Taco seasonings. Relishes. Fats and Oils Butter, stick margarine, lard, shortening, ghee, and bacon fat. Coconut, palm kernel, or palm oils. Regular salad dressings. Other Pickles and olives. Salted popcorn and pretzels. The items listed above may not be a complete list of foods and beverages to avoid. Contact your dietitian for more information. WHERE CAN I FIND MORE INFORMATION? National Heart, Lung, and Blood Institute: travelstabloid.com   This information is not intended to replace advice given to you by your health care provider. Make sure you discuss any questions you have with your health care provider.   Document Released: 05/16/2011 Document Revised: 06/17/2014 Document Reviewed: 03/31/2013 Elsevier Interactive Patient Education [employer].

## 2015-12-07 NOTE — Progress Notes (Signed)
Patient ID: [name], female   DOB: [birth year], 70 y.o.   MRN: [id number]    Subjective:   [name] is a 70 y.o. female who presents for a Sub Medicare Annual Wellness Visit and check INR today.   Review of Systems  Constitutional: Negative.   HENT: Positive for hearing loss.   Eyes: Negative.   Respiratory: Negative.   Cardiovascular: Negative.   Gastrointestinal: Negative.   Genitourinary: Negative.   Musculoskeletal:       C/o pain in left side after lifting child she is caring for   Skin: Negative.   Neurological: Negative.   Endo/Heme/Allergies: Bruises/bleeds easily.  Psychiatric/Behavioral: Positive for memory loss.    Current Medications (verified) Outpatient Encounter Prescriptions as of 12/07/2015  Medication Sig  . acetaminophen-codeine (TYLENOL #3) 300-30 MG tablet One tablet every four hours as needed for pain.  Must last TEN days.  . Cholecalciferol (VITAMIN D3) 5000 UNITS CAPS Take 5,000 Units by mouth daily.  . Choline Fenofibrate (FENOFIBRIC ACID) 135 MG CPDR TAKE 1 CAPSULE BY MOUTH DAILY.  Marland Kitchen. Doxepin HCl 5 % CREA   . DULoxetine (CYMBALTA) 30 MG capsule TAKE 1 CAPSULE BY MOUTH EVERY DAY  . fish oil-omega-3 fatty acids 1000 MG capsule Take 1 g by mouth every morning. Reported on 09/27/2015  . furosemide (LASIX) 40 MG tablet TAKE 1/2 TABLET BY MOUTH DAILY  . gabapentin (NEURONTIN) 300 MG capsule TAKE 1 CAPSULE BY MOUTH TWICE DAILY  . glimepiride (AMARYL) 2 MG tablet TAKE 1 TABLET BY MOUTH EVERY DAY BEFORE BREAKFAST  . glucose blood (ACCU-CHEK AVIVA) test strip Use to check blood glucose up to bid. Dx: E11.9 type 2 diabetes  . Lancets (ACCU-CHEK SOFT TOUCH) lancets Use to check BG up to bid. Dx: E11.9 - type 2 DM  . levothyroxine (SYNTHROID, LEVOTHROID) 88 MCG tablet TAKE 1 TABLET BY MOUTH EVERY MORNING BEFORE BREAKFAST  . losartan (COZAAR) 25 MG tablet TAKE 1 TABLET BY MOUTH DAILY  . meclizine (ANTIVERT) 50 MG tablet Take 50 mg by mouth daily as  needed for dizziness. Reported on 09/27/2015  . metFORMIN (GLUCOPHAGE) 500 MG tablet TAKE 1 TABLET BY MOUTH TWICE DAILY WITH A MEAL.  . metoprolol succinate (TOPROL-XL) 25 MG 24 hr tablet Take 0.5 tablets (12.5 mg total) by mouth daily.  Marland Kitchen. oxybutynin (DITROPAN) 5 MG tablet Take 1 tablet (5 mg total) by mouth 3 (three) times daily.  . pantoprazole (PROTONIX) 20 MG tablet TAKE 1 TABLET BY MOUTH TWICE DAILY  . potassium chloride SA (K-DUR,KLOR-CON) 20 MEQ tablet TAKE 1 TABLET BY MOUTH EVERY MORNING  . PROAIR HFA 108 (90 BASE) MCG/ACT inhaler INHALE 2 PUFFS INTO THE LUNGS EVERY 6 HOURS AS NEEDED FOR SHORTNESS OF BREATH.  . simvastatin (ZOCOR) 40 MG tablet TAKE 1 TABLET BY MOUTH AT BEDTIME  . warfarin (COUMADIN) 5 MG tablet Take 1/2 pill daily, take 1 whole pill on Monday and friday  . [DISCONTINUED] glimepiride (AMARYL) 2 MG tablet Take 1 tablet (2 mg total) by mouth daily before breakfast.   No facility-administered encounter medications on file as of 12/07/2015.    Allergies (verified) Procaine hcl; Codeine; Latex; and Penicillins   History: Past Medical History  Diagnosis Date  . Hypertension   . Diabetes mellitus   . Arthritis   . Blood transfusion 2007    History of  . PE (pulmonary embolism) February 2007; March 2013    Bilateral PEs in 07; massive PE March 2013 with moderate elevation in [place]  pressures.  . Anemia   . COPD (chronic obstructive pulmonary disease) with emphysema (HCC)   . Depression   . Anxiety   . Gastroesophageal reflux disease   . Osteoporosis   . DDD (degenerative disc disease)   . Uterine prolapse   . Asthma   . History of CHF (congestive heart failure) 2007    2013: Normal EF by echo x2 with no significant diastolic dysfunction noted either  . Heart murmur     No significant valvular lesions on echo  . Hyperlipidemia   . Hypothyroidism (acquired)     On Levothyroxine  . Seizures (HCC)     once  . Headache(784.0)   . Heart palpitations   . Chronic  kidney disease     lt kidney limited fxn  . Myocardial infarction (HCC)   . Chronic pain   . Anxiety and depression, ( ? early dementia) 08/14/2011   Past Surgical History  Procedure Laterality Date  . Hiatel hernia repair    . Tubal ligation    . Cardiac catheterization  March 2013    No evidence of coronary disease; also no significant aortic or iliac or renal artery disease.  . Left heart catheterization with coronary angiogram N/A 08/16/2011    Procedure: LEFT HEART CATHETERIZATION WITH CORONARY ANGIOGRAM;  Surgeon: Marykay Lexavid W Harding, MD;  [facility];  Service: Cardiovascular;  Laterality: N/A;   Family History  Problem Relation Age of Onset  . Heart disease Mother   . Stroke Mother   . Prostate cancer Father   . Cancer Father     bone  . Alzheimer's disease      family history  . Cancer Daughter     breast  . Cancer Paternal Uncle     unsure of what kind  . COPD Sister   . Heart disease Sister   . Goiter Sister   . Cancer Sister     THYROID  . Early death Brother 8  . Cancer Brother     leukemia  . Liver cancer Brother    Social History   Occupational History  . Not on file.   Social History Main Topics  . Smoking status: Former Smoker -- 2.00 packs/day for 40 years    Types: Cigarettes    Quit date: 03/23/2001  . Smokeless tobacco: Never Used     Comment: EXPOSED TO PASSIVE SMOKE  . Alcohol Use: No  . Drug Use: No  . Sexual Activity: No    Do you feel safe at home?  Yes  Dietary issues and exercise activities: Current Exercise Habits: Home exercise routine, Type of exercise: walking, Time (Minutes): 15, Frequency (Times/Week): 4, Weekly Exercise (Minutes/Week): 60, Intensity: Moderate  Current Dietary habits:  Patient is not following any specific dietary recommendations or restrictions  Objective:    Today's Vitals   12/07/15 1054  BP: 160/72  Pulse: 60  Height: 5' 2.5" (1.588 m)  Weight: 187 lb (84.823 kg)  PainSc: 2   PainLoc:  Abdomen   Body mass index is 33.64 kg/(m^2).   INR was 2.7 today in office   Activities of Daily Living In your present state of health, do you have any difficulty performing the following activities: 12/07/2015 05/23/2015  Hearing? N Y  Vision? N Y  Difficulty concentrating or making decisions? N Y  Walking or climbing stairs? N Y  Dressing or bathing? N Y  Doing errands, shopping? Malvin JohnsY Y  Preparing Food and eating ? N -  Using the Toilet? N -  In the past six months, have you accidently leaked urine? N -  Do you have problems with loss of bowel control? N -  Managing your Medications? N -  Managing your Finances? N -  Housekeeping or managing your Housekeeping? N -    Are there smokers in your home (other than you)? YES - HUSBAND; SMOKES ABOUT 1/2 pack per day   Depression Screen PHQ 2/9 Scores 12/07/2015 02/28/2015 11/30/2014 11/09/2014  PHQ - 2 Score 1 0 1 0    Fall Risk Fall Risk  12/07/2015 11/30/2014 11/09/2014 07/29/2014 04/21/2014  Falls in the past year? No No No No No    Cognitive Function: MMSE - Mini Mental State Exam 12/07/2015 11/30/2014  Orientation to time 1 4  Orientation to Place 5 4  Registration 3 3  Attention/ Calculation 2 3  Recall 3 3  Language- name 2 objects 2 2  Language- repeat 1 1  Language- follow 3 step command 3 3  Language- read & follow direction 1 1  Write a sentence 0 1  Copy design 1 1  Total score 22 26    Immunizations and Health Maintenance Immunization History  Administered Date(s) Administered  . Influenza Split 07/19/2011  . Influenza,inj,Quad PF,36+ Mos 03/22/2014, 03/15/2015  . Pneumococcal Conjugate-13 11/09/2014  . Pneumococcal Polysaccharide-23 07/19/2011   Health Maintenance Due  Topic Date Due  . Hepatitis C Screening  12-Apr-1946  . TETANUS/TDAP  02/08/1967  . MAMMOGRAM  02/07/1998  . ZOSTAVAX  02/08/2008    Patient Care Team: Elenora Gamma, MD as PCP - General (Family Medicine) Darreld Mclean, MD as  Consulting Physician (Orthopedic Surgery) Smitty Cords, OD (Optometry)  Indicate any recent Medical Services you may have received from other than [facility] providers in the past year (date may be approximate).    Assessment:    Annual Wellness Visit  Decreased hearing in both ears.  HTN - patient is unsure if her husband is giving her medications as prescribed Anticoagulation therapy - INR at goal   Screening Tests Health Maintenance  Topic Date Due  . Hepatitis C Screening  11-28-1945  . TETANUS/TDAP  02/08/1967  . MAMMOGRAM  02/07/1998  . ZOSTAVAX  02/08/2008  . OPHTHALMOLOGY EXAM  12/16/2015  . INFLUENZA VACCINE  01/09/2016  . FOOT EXAM  02/09/2016  . HEMOGLOBIN A1C  03/28/2016  . PNA vac Low Risk Adult (2 of 2 - PPSV23) 07/18/2016  . DEXA SCAN  11/29/2016  . COLONOSCOPY  08/13/2021        Plan:   During the course of the visit [name] was educated and counseled about the following appropriate screening and preventive services:   Vaccines to include Pneumoccal, Influenza, Hepatitis B, Td, Zostavax - patient is due Zostavax and Tdap.  Checked cost which was $8.40 each - patient did not have any money today y  Colorectal cancer screening - colonoscopy UTD;   Cardiovascular disease screening - UTD.    BP was elevated today.  Reviewed all meds and how to take with patient.  Specifically highlighted the BP meds for there to check his husband to determine if he is adminstering to her correctly.  Recommended to get her pharmacy to package meds for her.  States that she tried in past and her husband stopped because he through she was getting too much medication.   LDL is at goal.  Diabetes - A1c is at goal. Urine microalbumin checked today.  Bone Denisty / Osteoporosis  Screening - UTD  Mammogram - appt scheduled for 03/04/2016 with [facility] mobile unit  Glaucoma screening / Diabetic Eye Exam - called and scheduled exam for patient for July 10th at 1:20pm my eye dr in  [facility]  Nutrition counseling - discussed low salt / low CHO diet  Referral sent for audiologist evaluation  Advanced Directives - UTD  Referral to audiologist - pt declined  Recommended rest and moist heat to left side. If not improved in 3 days then RTC for evaluation.  RTC in 2 weeks to see PCP for BP recheck and f/u MMSE changes  Anticoagulation Dose Instructions as of 12/07/2015      [name] Tue Wed Thu Fri Sat   New Dose 2.5 mg 5 mg 2.5 mg 2.5 mg 2.5 mg 5 mg 2.5 mg    Description        Continue warfarin  tablets -  take 1 tablet ( ) on Monday and Friday and 1/2 tablet (2.5mg ) every other day.     Orders Placed This Encounter  Procedures  . Microalbumin / creatinine urine ratio    Patient Instructions (the written plan) were given to the patient.   [name], [facility]   12/08/2015

## 2015-12-08 ENCOUNTER — Ambulatory Visit: Payer: Self-pay | Admitting: Pharmacist

## 2015-12-08 LAB — MICROALBUMIN / CREATININE URINE RATIO
Creatinine, Urine: 52 mg/dL
MICROALB/CREAT RATIO: <5.8 mg/g creat (ref 0.0–30.0)
Microalbumin, Urine: <3 ug/mL

## 2015-12-13 ENCOUNTER — Other Ambulatory Visit: Payer: Self-pay | Admitting: Family Medicine

## 2015-12-27 ENCOUNTER — Ambulatory Visit (INDEPENDENT_AMBULATORY_CARE_PROVIDER_SITE_OTHER): Payer: Medicare Other | Admitting: Family Medicine

## 2015-12-27 ENCOUNTER — Encounter: Payer: Self-pay | Admitting: Family Medicine

## 2015-12-27 VITALS — BP 123/46 | HR 57 | Temp 97.3°F | Ht 62.5 in | Wt 180.8 lb

## 2015-12-27 DIAGNOSIS — M25571 Pain in right ankle and joints of right foot: Secondary | ICD-10-CM | POA: Diagnosis not present

## 2015-12-27 DIAGNOSIS — F0390 Unspecified dementia without behavioral disturbance: Secondary | ICD-10-CM | POA: Diagnosis not present

## 2015-12-27 DIAGNOSIS — E1121 Type 2 diabetes mellitus with diabetic nephropathy: Secondary | ICD-10-CM

## 2015-12-27 DIAGNOSIS — Z7901 Long term (current) use of anticoagulants: Secondary | ICD-10-CM

## 2015-12-27 LAB — COAGUCHEK XS/INR WAIVED
INR: 1.4 — ABNORMAL HIGH (ref 0.9–1.1)
PROTHROMBIN TIME: 16.7 s

## 2015-12-27 LAB — BAYER DCA HB A1C WAIVED: HB A1C (BAYER DCA - WAIVED): 7.5 % — ABNORMAL HIGH (ref <7.0)

## 2015-12-27 MED ORDER — DONEPEZIL HCL 5 MG PO TABS: 5.0000 mg | ORAL_TABLET | Freq: Every day | ORAL | Status: DC

## 2015-12-27 NOTE — Patient Instructions (Signed)
Anticoagulation Dose Instructions as of 12/27/2015      Glynis SmilesSun Mon Tue Wed Thu Fri Sat   New Dose 2.5 mg 5 mg 2.5 mg 5 mg 2.5 mg 5 mg 2.5 mg    Description        Continue warfarin 5mg  tablets -  take 1 tablet (5mg ) on Monday, Wednesday,  and Friday and 1/2 tablet (2.5mg ) every other day.

## 2015-12-27 NOTE — Patient Instructions (Signed)
Great to see you!  We will arrange a CT of your head to look for reasons for memory loss  I have started you on aricept for your memory, 1 pill once  Day at night   We will call with lab results within 1 week

## 2015-12-27 NOTE — Progress Notes (Signed)
   HPI  Patient presents today here to follow-up for diabetes, ankle pain, and memory loss.  Memory Patient states that she's had several issues for 2 or 3 years. She states that she has noticed being forgetful, saying things that she didn't realize she was saying and then family members asking her to repeat it. She has no problems with her ADLs, she dresses herself, toilets without a problem, manages bills, and cooks for the whole family. Her mother had dementia as well.  Diabetes Good medication compliance Cannot remember any glucose checks, she also cannot name any medications that she's taking No hypoglycemia.  Ankle pain Right sided ankle pain described in the lateral side with a twisting of ankle. No injury except for the fracture year ago, no problems bearing weight.   PMH: Smoking status noted ROS: Per HPI  Objective: BP 123/46 mmHg  Pulse 57  Temp(Src) 97.3 F (36.3 C) (Oral)  Ht 5' 2.5" (1.588 m)  Wt 180 lb 12.8 oz (82.01 kg)  BMI 32.52 kg/m2 Gen: NAD, alert, cooperative with exam HEENT: NCAT CV: RRR, good S1/S2, no murmur Resp: CTABL, no wheezes, non-labored Ext: No edema, warm Neuro: Alert and oriented, No gross deficits  Musculoskeletal Right ankle with no joint laxity, no tenderness to palpation of lateral or medial ligaments, mild tenderness with eversion of the ankle on the lateral aspect  Assessment and plan:  # Dementia She does not appear to have any severe uncontrolled depression or other reversible causes for dementia. She's had a significant decline in her MMSE score in one year changing from 26-22 Cautiously starting Aricept, monitor for change in heart rate or palpitations. Labs CT of the head No clear limitations in ADLs and no problems with her behavior so far.  # Type 2 diabetes A1c pending. Previously well controlled Continue metformin and glimepiride for now.  # Ankle pain Mild, intermittent Recommended ankle brace that she's going  to her sandals, she's wearing sandals today.  # Chronic anticoagulation for history of pulmonary embolism INR today is subtheraputic See INR visit for adjustments. Increase by 2.5 mg per week, repeat in 1 month    Orders Placed This Encounter  Procedures  . CT Head Wo Contrast    Standing Status: Future     Number of Occurrences:      Standing Expiration Date: 03/28/2017    Order Specific Question:  Reason for Exam (SYMPTOM  OR DIAGNOSIS REQUIRED)    Answer:  dementia, r/o occult stroke    Order Specific Question:  Preferred imaging location?    Answer:  [facility] CBC with Differential  . TSH  . Bayer DCA Hb A1c Waived  . CoaguChek XS/INR Waived    Meds ordered this encounter  Medications  . donepezil (ARICEPT) 5 MG tablet    Sig: Take 1 tablet (5 mg total) by mouth at bedtime.    Dispense:  30 tablet    Refill:  [place], MD [place] 12/27/2015, 11:21 AM

## 2015-12-27 NOTE — Progress Notes (Signed)
See anti-coag documentation  Also see office visit from today. Patient needs very clear instructions so separate visit was created.  Murtis SinkSam Leotta Weingarten, MD [facility] 12/27/2015, 11:38 AM

## 2015-12-28 LAB — CMP14+EGFR
ALBUMIN: 4.2 g/dL (ref 3.6–4.8)
ALT: 19 IU/L (ref 0–32)
AST: 22 IU/L (ref 0–40)
Albumin/Globulin Ratio: 1.5 (ref 1.2–2.2)
Alkaline Phosphatase: 46 IU/L (ref 39–117)
BILIRUBIN TOTAL: 0.4 mg/dL (ref 0.0–1.2)
BUN / CREAT RATIO: 23 (ref 12–28)
BUN: 30 mg/dL — AB (ref 8–27)
CO2: 24 mmol/L (ref 18–29)
CREATININE: 1.31 mg/dL — AB (ref 0.57–1.00)
Calcium: 10.1 mg/dL (ref 8.7–10.3)
Chloride: 101 mmol/L (ref 96–106)
GFR, EST AFRICAN AMERICAN: 49 mL/min/{1.73_m2} — AB (ref >59)
GFR, EST NON AFRICAN AMERICAN: 42 mL/min/{1.73_m2} — AB (ref >59)
GLUCOSE: 224 mg/dL — AB (ref 65–99)
Globulin, Total: 2.8 g/dL (ref 1.5–4.5)
Potassium: 4.7 mmol/L (ref 3.5–5.2)
Sodium: 144 mmol/L (ref 134–144)
TOTAL PROTEIN: 7 g/dL (ref 6.0–8.5)

## 2015-12-28 LAB — CBC WITH DIFFERENTIAL/PLATELET
BASOS: 1 %
Basophils Absolute: 0 10*3/uL (ref 0.0–0.2)
EOS (ABSOLUTE): 0.1 10*3/uL (ref 0.0–0.4)
EOS: 2 %
HEMATOCRIT: 38.8 % (ref 34.0–46.6)
HEMOGLOBIN: 12.6 g/dL (ref 11.1–15.9)
Immature Grans (Abs): 0 10*3/uL (ref 0.0–0.1)
Immature Granulocytes: 1 %
LYMPHS ABS: 1.2 10*3/uL (ref 0.7–3.1)
Lymphs: 27 %
MCH: 29.4 pg (ref 26.6–33.0)
MCHC: 32.5 g/dL (ref 31.5–35.7)
MCV: 90 fL (ref 79–97)
MONOCYTES: 11 %
Monocytes Absolute: 0.5 10*3/uL (ref 0.1–0.9)
NEUTROS ABS: 2.5 10*3/uL (ref 1.4–7.0)
Neutrophils: 58 %
Platelets: 246 10*3/uL (ref 150–379)
RBC: 4.29 x10E6/uL (ref 3.77–5.28)
RDW: 14 % (ref 12.3–15.4)
WBC: 4.3 10*3/uL (ref 3.4–10.8)

## 2015-12-28 LAB — TSH: TSH: 1.15 u[IU]/mL (ref 0.450–4.500)

## 2016-01-01 ENCOUNTER — Other Ambulatory Visit: Payer: Self-pay | Admitting: Family Medicine

## 2016-01-01 DIAGNOSIS — N3281 Overactive bladder: Secondary | ICD-10-CM

## 2016-01-03 ENCOUNTER — Ambulatory Visit (HOSPITAL_COMMUNITY)
Admission: RE | Admit: 2016-01-03 | Discharge: 2016-01-03 | Disposition: A | Discharge status: Home / Self Care | Payer: Medicare Other | Source: Ambulatory Visit | Attending: Family Medicine | Admitting: Family Medicine

## 2016-01-03 DIAGNOSIS — F0390 Unspecified dementia without behavioral disturbance: Secondary | ICD-10-CM | POA: Insufficient documentation

## 2016-01-03 DIAGNOSIS — S069X9A Unspecified intracranial injury with loss of consciousness of unspecified duration, initial encounter: Secondary | ICD-10-CM | POA: Diagnosis not present

## 2016-01-22 ENCOUNTER — Encounter: Payer: Self-pay | Admitting: Pediatrics

## 2016-01-22 ENCOUNTER — Ambulatory Visit (INDEPENDENT_AMBULATORY_CARE_PROVIDER_SITE_OTHER): Payer: Medicare Other | Admitting: Pediatrics

## 2016-01-22 VITALS — BP 115/51 | HR 53 | Temp 97.0°F | Ht 62.5 in | Wt 188.0 lb

## 2016-01-22 DIAGNOSIS — G5602 Carpal tunnel syndrome, left upper limb: Secondary | ICD-10-CM | POA: Diagnosis not present

## 2016-01-22 DIAGNOSIS — E1122 Type 2 diabetes mellitus with diabetic chronic kidney disease: Secondary | ICD-10-CM

## 2016-01-22 DIAGNOSIS — N183 Chronic kidney disease, stage 3 (moderate): Secondary | ICD-10-CM | POA: Diagnosis not present

## 2016-01-22 DIAGNOSIS — M25512 Pain in left shoulder: Secondary | ICD-10-CM

## 2016-01-22 NOTE — Patient Instructions (Addendum)
Can use aspercreme on shoulder   Shoulder Range of Motion Exercises Shoulder range of motion (ROM) exercises are designed to keep the shoulder moving freely. They are often recommended for people who have shoulder pain. MOVEMENT EXERCISE When you are able, do this exercise 5-6 days per week, or as told by your health care provider. Work toward doing 2 sets of 10 swings. Pendulum Exercise How To Do This Exercise Lying Down 1. Lie face-down on a bed with your abdomen close to the side of the bed. 2. Let your arm hang over the side of the bed. 3. Relax your shoulder, arm, and hand. 4. Slowly and gently swing your arm forward and back. Do not use your neck muscles to swing your arm. They should be relaxed. If you are struggling to swing your arm, have someone gently swing it for you. When you do this exercise for the first time, swing your arm at a 15 degree angle for 15 seconds, or swing your arm 10 times. As pain lessens over time, increase the angle of the swing to 30-45 degrees. 5. Repeat steps 1-4 with the other arm. How To Do This Exercise While Standing 1. Stand next to a sturdy chair or table and hold on to it with your hand.  Bend forward at the waist.  Bend your knees slightly.  Relax your other arm and let it hang limp.  Relax the shoulder blade of the arm that is hanging and let it drop.  While keeping your shoulder relaxed, use body motion to swing your arm in small circles. The first time you do this exercise, swing your arm for about 30 seconds or 10 times. When you do it next time, swing your arm for a little longer.  Stand up tall and relax.  Repeat steps 1-7, this time changing the direction of the circles. 2. Repeat steps 1-8 with the other arm. STRETCHING EXERCISES Do these exercises 3-4 times per day on 5-6 days per week or as told by your health care provider. Work toward holding the stretch for 20 seconds. Stretching Exercise 1 1. Lift your arm straight out in  front of you. 2. Bend your arm 90 degrees at the elbow (right angle) so your forearm goes across your body and looks like the letter "L." 3. Use your other arm to gently pull the elbow forward and across your body. 4. Repeat steps 1-3 with the other arm. Stretching Exercise 2 You will need a towel or rope for this exercise. 1. Bend one arm behind your back with the palm facing outward. 2. Hold a towel with your other hand. 3. Reach the arm that holds the towel above your head, and bend that arm at the elbow. Your wrist should be behind your neck. 4. Use your free hand to grab the free end of the towel. 5. With the higher hand, gently pull the towel up behind you. 6. With the lower hand, pull the towel down behind you. 7. Repeat steps 1-6 with the other arm. STRENGTHENING EXERCISES Do each of these exercises at four different times of day (sessions) every day or as told by your health care provider. To begin with, repeat each exercise 5 times (repetitions). Work toward doing 3 sets of 12 repetitions or as told by your health care provider. Strengthening Exercise 1 You will need a light weight for this activity. As you grow stronger, you may use a heavier weight. 1. Standing with a weight in your hand, lift  your arm straight out to the side until it is at the same height as your shoulder. 2. Bend your arm at 90 degrees so that your fingers are pointing to the ceiling. 3. Slowly raise your hand until your arm is straight up in the air. 4. Repeat steps 1-3 with the other arm. Strengthening Exercise 2 You will need a light weight for this activity. As you grow stronger, you may use a heavier weight. 1. Standing with a weight in your hand, gradually move your straight arm in an arc, starting at your side, then out in front of you, then straight up over your head. 2. Gradually move your other arm in an arc, starting at your side, then out in front of you, then straight up over your head. 3. Repeat  steps 1-2 with the other arm. Strengthening Exercise 3 You will need an elastic band for this activity. As you grow stronger, gradually increase the size of the bands or increase the number of bands that you use at one time. 1. While standing, hold an elastic band in one hand and raise that arm up in the air. 2. With your other hand, pull down the band until that hand is by your side. 3. Repeat steps 1-2 with the other arm.   This information is not intended to replace advice given to you by your health care provider. Make sure you discuss any questions you have with your health care provider.   Document Released: 02/23/2003 Document Revised: 10/11/2014 Document Reviewed: 05/23/2014 Elsevier Interactive Patient Education [employer]2016 Elsevier Inc.

## 2016-01-22 NOTE — Progress Notes (Signed)
    Subjective:    Patient ID: Tana ConchBrenda M Manwarren, female    DOB: 01/20/1946, 70 y.o.   MRN: [id number]  CC: Arm Pain (Left for 1 week)   HPI: Tana ConchBrenda M Lewing is a 70 y.o. female presenting for Arm Pain (Left for 1 week)  Watches her grandchildren, 68yo, 274yo, had to grab 68 yo to keep him from running into street, since then having some L arm pain Comes and goes Has numbness in palm of L hand Fingers sometimes tingle Feels weak L arm at times, also weak R arm at times Is R handed Hurts some in her back as well   Relevant past medical, surgical, family and social history reviewed. Interim medical history since our last visit reviewed. Allergies and medications reviewed and updated.  History  Smoking Status  . Former Smoker  . Packs/day: 2.00  . Years: 40.00  . Types: Cigarettes  . Quit date: 03/23/2001  Smokeless Tobacco  . Never Used    Comment: EXPOSED TO PASSIVE SMOKE    ROS: Per HPI      Objective:    BP (!) 115/51   Pulse (!) 53   Temp 97 F (36.1 C) (Oral)   Ht 5' 2.5" (1.588 m)   Wt 188 lb (85.3 kg)   BMI 33.84 kg/m   Wt Readings from Last 3 Encounters:  01/22/16 188 lb (85.3 kg)  12/27/15 180 lb 12.8 oz (82 kg)  12/07/15 187 lb (84.8 kg)     Gen: NAD, alert, cooperative with exam, NCAT EYES: EOMI, no conjunctival injection, or no icterus ENT:  OP without erythema LYMPH: no cervical LAD CV: NRRR, normal S1/S2, no murmur, distal pulses 2+ b/l Resp: CTABL, no wheezes, normal WOB Abd: +BS, soft, NTND. no guarding or organomegaly Ext: No edema, warm Neuro: Alert MSK: hand grip equal L and, R hand 5/5. Normal ROM b/l shoulders. 5/5 strength with shoulder ext/flex b/l. Some numbness L palm.     Assessment & Plan:  Steward DroneBrenda was seen today for arm pain.  Diagnoses and all orders for this visit:  Carpal tunnel syndrome of left wrist -     Wrist splint  Pain in joint of left shoulder Rest, ice, aspercreme Avoid NSAIDs given kidney function  HTN BP  low-normal Asymptomatic Pt to let us know if symptoms change  Follow up plan: Return in about 3 weeks (around 02/12/2016), or if symptoms worsen or fail to improve.  Rex Krasarol Rosealyn Little, MD [facility] 01/22/2016, 12:47 PM

## 2016-01-29 ENCOUNTER — Ambulatory Visit (INDEPENDENT_AMBULATORY_CARE_PROVIDER_SITE_OTHER): Payer: Medicare Other | Admitting: Pharmacist

## 2016-01-29 DIAGNOSIS — Z86711 Personal history of pulmonary embolism: Secondary | ICD-10-CM

## 2016-01-29 DIAGNOSIS — Z7901 Long term (current) use of anticoagulants: Secondary | ICD-10-CM

## 2016-01-29 LAB — COAGUCHEK XS/INR WAIVED
INR: 2.2 — ABNORMAL HIGH (ref 0.9–1.1)
Prothrombin Time: 26.6 s

## 2016-01-29 NOTE — Patient Instructions (Signed)
Stop Fish oil to see if bruising improves

## 2016-01-30 ENCOUNTER — Other Ambulatory Visit: Payer: Self-pay | Admitting: Family Medicine

## 2016-02-29 ENCOUNTER — Other Ambulatory Visit: Payer: Self-pay | Admitting: Family Medicine

## 2016-03-04 ENCOUNTER — Ambulatory Visit (INDEPENDENT_AMBULATORY_CARE_PROVIDER_SITE_OTHER): Payer: Medicare Other | Admitting: Pharmacist

## 2016-03-04 ENCOUNTER — Encounter: Payer: Self-pay | Admitting: *Deleted

## 2016-03-04 DIAGNOSIS — Z1231 Encounter for screening mammogram for malignant neoplasm of breast: Secondary | ICD-10-CM | POA: Diagnosis not present

## 2016-03-04 DIAGNOSIS — Z7901 Long term (current) use of anticoagulants: Secondary | ICD-10-CM | POA: Diagnosis not present

## 2016-03-04 DIAGNOSIS — Z86711 Personal history of pulmonary embolism: Secondary | ICD-10-CM | POA: Diagnosis not present

## 2016-03-04 LAB — COAGUCHEK XS/INR WAIVED
INR: 1.6 — ABNORMAL HIGH (ref 0.9–1.1)
PROTHROMBIN TIME: 18.6 s

## 2016-03-04 MED ORDER — WARFARIN SODIUM 5 MG PO TABS: ORAL_TABLET | ORAL | 1 refills | Status: DC

## 2016-03-18 ENCOUNTER — Ambulatory Visit (INDEPENDENT_AMBULATORY_CARE_PROVIDER_SITE_OTHER): Payer: Medicare Other | Admitting: Pharmacist

## 2016-03-18 DIAGNOSIS — Z23 Encounter for immunization: Secondary | ICD-10-CM

## 2016-03-18 DIAGNOSIS — Z7901 Long term (current) use of anticoagulants: Secondary | ICD-10-CM

## 2016-03-18 DIAGNOSIS — Z86711 Personal history of pulmonary embolism: Secondary | ICD-10-CM

## 2016-03-18 LAB — COAGUCHEK XS/INR WAIVED
INR: 1.3 — AB (ref 0.9–1.1)
PROTHROMBIN TIME: 15.6 s

## 2016-03-19 ENCOUNTER — Telehealth: Payer: Self-pay | Admitting: Family Medicine

## 2016-03-19 MED ORDER — ALBUTEROL SULFATE HFA 108 (90 BASE) MCG/ACT IN AERS: 2.0000 | INHALATION_SPRAY | Freq: Four times a day (QID) | RESPIRATORY_TRACT | 1 refills | Status: DC | PRN

## 2016-03-19 NOTE — Telephone Encounter (Signed)
Rx for Pro air sent to pharmacy

## 2016-03-19 NOTE — Telephone Encounter (Signed)
Do you know about this?

## 2016-03-20 ENCOUNTER — Ambulatory Visit (INDEPENDENT_AMBULATORY_CARE_PROVIDER_SITE_OTHER): Payer: Medicare Other | Admitting: Pharmacist

## 2016-03-20 DIAGNOSIS — Z7901 Long term (current) use of anticoagulants: Secondary | ICD-10-CM | POA: Diagnosis not present

## 2016-03-20 DIAGNOSIS — Z86711 Personal history of pulmonary embolism: Secondary | ICD-10-CM

## 2016-03-20 LAB — COAGUCHEK XS/INR WAIVED
INR: 1.5 — AB (ref 0.9–1.1)
PROTHROMBIN TIME: 18.1 s

## 2016-03-20 MED ORDER — WARFARIN SODIUM 5 MG PO TABS: ORAL_TABLET | ORAL | 0 refills | Status: DC

## 2016-03-21 ENCOUNTER — Other Ambulatory Visit: Payer: Self-pay

## 2016-03-21 DIAGNOSIS — N644 Mastodynia: Secondary | ICD-10-CM

## 2016-03-22 ENCOUNTER — Telehealth: Payer: Self-pay | Admitting: Family Medicine

## 2016-04-01 ENCOUNTER — Other Ambulatory Visit: Payer: Self-pay | Admitting: Family Medicine

## 2016-04-02 ENCOUNTER — Ambulatory Visit (HOSPITAL_COMMUNITY)
Admission: RE | Admit: 2016-04-02 | Discharge: 2016-04-02 | Disposition: A | Discharge status: Home / Self Care | Payer: Medicare Other | Source: Ambulatory Visit | Attending: Nurse Practitioner | Admitting: Nurse Practitioner

## 2016-04-02 DIAGNOSIS — N6489 Other specified disorders of breast: Secondary | ICD-10-CM | POA: Diagnosis not present

## 2016-04-02 DIAGNOSIS — R928 Other abnormal and inconclusive findings on diagnostic imaging of breast: Secondary | ICD-10-CM | POA: Diagnosis not present

## 2016-04-02 DIAGNOSIS — N644 Mastodynia: Secondary | ICD-10-CM

## 2016-04-05 ENCOUNTER — Ambulatory Visit (INDEPENDENT_AMBULATORY_CARE_PROVIDER_SITE_OTHER): Payer: Medicare Other | Admitting: Family Medicine

## 2016-04-05 ENCOUNTER — Encounter: Payer: Self-pay | Admitting: Family Medicine

## 2016-04-05 VITALS — BP 127/64 | HR 55 | Temp 97.3°F | Ht 62.5 in | Wt 183.8 lb

## 2016-04-05 DIAGNOSIS — Z7901 Long term (current) use of anticoagulants: Secondary | ICD-10-CM | POA: Diagnosis not present

## 2016-04-05 DIAGNOSIS — E1121 Type 2 diabetes mellitus with diabetic nephropathy: Secondary | ICD-10-CM | POA: Diagnosis not present

## 2016-04-05 DIAGNOSIS — N183 Chronic kidney disease, stage 3 unspecified: Secondary | ICD-10-CM

## 2016-04-05 DIAGNOSIS — Z86711 Personal history of pulmonary embolism: Secondary | ICD-10-CM

## 2016-04-05 DIAGNOSIS — E1122 Type 2 diabetes mellitus with diabetic chronic kidney disease: Secondary | ICD-10-CM

## 2016-04-05 LAB — COAGUCHEK XS/INR WAIVED
INR: 6.2 (ref 0.9–1.1)
INR: 6.3 (ref 0.9–1.1)
PROTHROMBIN TIME: 74.5 s
Prothrombin Time: 75.3 s

## 2016-04-05 LAB — FINGERSTICK HEMOGLOBIN: Hemoglobin: 11.4 g/dL (ref 11.1–15.9)

## 2016-04-05 LAB — BAYER DCA HB A1C WAIVED: HB A1C (BAYER DCA - WAIVED): 6.9 % (ref <7.0)

## 2016-04-05 NOTE — Progress Notes (Signed)
   HPI  Patient presents today here for routine follow-up.  Chronic anticoagulation, history of pulmonary embolism Patient had 2 pulmonary embolisms, the first in February 2007, the second in March 2013. She's been managed with Coumadin since that time.  Patient comes in today stating that she has been off of her medications for 4 months, when I discussed with her that she was seen on October 11 by our clinical pharmacist, she does not remember this.  She states her husband, who cannot read or write, manages her medications.  Diabetes Does not check blood sugars, states that "it's going pretty well" she feels well. She does watch her diet carefully.  She denies any hematochezia, melena, nosebleeds, or bleeding of any other kind. She stumbled 2 days ago scraping her nose on the edge of a tree and causing diffuse bleeding that was controlled with pressure. She has not had any other bleeding.  PMH: Smoking status noted ROS: Per HPI  Objective: BP 127/64   Pulse (!) 55   Temp 97.3 F (36.3 C) (Oral)   Ht 5' 2.5" (1.588 m)   Wt 183 lb 12.8 oz (83.4 kg)   BMI 33.08 kg/m  Gen: NAD, alert, cooperative with exam HEENT: NCAT CV: RRR, good S1/S2, no murmur Resp: CTABL, no wheezes, non-labored Abd: SNTND, BS present, no guarding or organomegaly Ext: No edema, warm Neuro: Alert and oriented, No gross deficits  Assessment and plan:  # Chronic anticoagulation Indication history of recurrent pulmonary embolism She is supratherapeutic today, INR is 6.3 Hemoglobin is largely stable on point-of-care check Sending CBC, CMP Patient has no idea what dose of Coumadin she's taking She denies any recent falls except for her stumble where she scraped her nose. Concerned that her dementia is worsening, her last MMSE was 22/30 She can repeat back today that she should stop Coumadin for the weekend and follow-up on Monday with our clinical pharmacist for recheck I've given her very low  threshold for seeking emergency medical care  # Type 2 diabetes A1c pending Patient states that she's off of all medications, she's not checking blood sugars I do not believe she is off of all of her medications, will continue to investigate this at a follow-up 2 weeks from now. For now continue current medications, await A1c  # Chronic kidney disease stage III Likely stable Labs rechecked today     Orders Placed This Encounter  Procedures  . Bayer DCA Hb A1c Waived  . CBC with Differential  . BMP8+EGFR  . Hepatic function panel  . Fingerstick Hemoglobin  . Protime-INR  . Bayer [facility] Hb A1c Carney Bern, MD [facility] 04/05/2016, 12:00 PM

## 2016-04-05 NOTE — Patient Instructions (Addendum)
Great to see you!  Its very important that you do not take any coumadoin this weekend and you come back on Monday for a re-check with the pharmacists.   If you have any falls, black or bloody stools, or nosebleeds, or any bleeding that will not stop this weekend please go to the emergency room.

## 2016-04-06 ENCOUNTER — Telehealth: Payer: Self-pay | Admitting: Physician Assistant

## 2016-04-06 LAB — PROTIME-INR
INR: 6.7 — AB (ref 0.8–1.2)
Prothrombin Time: 62 s — ABNORMAL HIGH (ref 9.1–12.0)

## 2016-04-06 LAB — BMP8+EGFR
BUN / CREAT RATIO: 22 (ref 12–28)
BUN: 29 mg/dL — AB (ref 8–27)
CALCIUM: 9.6 mg/dL (ref 8.7–10.3)
CHLORIDE: 100 mmol/L (ref 96–106)
CO2: 26 mmol/L (ref 18–29)
CREATININE: 1.29 mg/dL — AB (ref 0.57–1.00)
GFR calc Af Amer: 49 mL/min/{1.73_m2} — ABNORMAL LOW (ref >59)
GFR calc non Af Amer: 43 mL/min/{1.73_m2} — ABNORMAL LOW (ref >59)
GLUCOSE: 180 mg/dL — AB (ref 65–99)
Potassium: 4.2 mmol/L (ref 3.5–5.2)
Sodium: 141 mmol/L (ref 134–144)

## 2016-04-06 LAB — HEPATIC FUNCTION PANEL
ALBUMIN: 3.9 g/dL (ref 3.6–4.8)
ALK PHOS: 42 IU/L (ref 39–117)
ALT: 21 IU/L (ref 0–32)
AST: 17 IU/L (ref 0–40)
BILIRUBIN TOTAL: 0.4 mg/dL (ref 0.0–1.2)
BILIRUBIN, DIRECT: 0.17 mg/dL (ref 0.00–0.40)
TOTAL PROTEIN: 6.6 g/dL (ref 6.0–8.5)

## 2016-04-06 LAB — CBC WITH DIFFERENTIAL/PLATELET
BASOS ABS: 0 10*3/uL (ref 0.0–0.2)
BASOS: 1 %
EOS (ABSOLUTE): 0.1 10*3/uL (ref 0.0–0.4)
Eos: 2 %
HEMOGLOBIN: 11.8 g/dL (ref 11.1–15.9)
Hematocrit: 36.3 % (ref 34.0–46.6)
IMMATURE GRANS (ABS): 0 10*3/uL (ref 0.0–0.1)
IMMATURE GRANULOCYTES: 1 %
LYMPHS: 42 %
Lymphocytes Absolute: 2.1 10*3/uL (ref 0.7–3.1)
MCH: 28.9 pg (ref 26.6–33.0)
MCHC: 32.5 g/dL (ref 31.5–35.7)
MCV: 89 fL (ref 79–97)
MONOCYTES: 9 %
Monocytes Absolute: 0.5 10*3/uL (ref 0.1–0.9)
NEUTROS ABS: 2.3 10*3/uL (ref 1.4–7.0)
NEUTROS PCT: 45 %
PLATELETS: 248 10*3/uL (ref 150–379)
RBC: 4.09 x10E6/uL (ref 3.77–5.28)
RDW: 13.5 % (ref 12.3–15.4)
WBC: 5 10*3/uL (ref 3.4–10.8)

## 2016-04-06 NOTE — Telephone Encounter (Signed)
Note to Dr. Ermalinda MemosBradshaw

## 2016-04-08 ENCOUNTER — Ambulatory Visit (INDEPENDENT_AMBULATORY_CARE_PROVIDER_SITE_OTHER): Payer: Medicare Other | Admitting: Pharmacist

## 2016-04-08 ENCOUNTER — Telehealth: Payer: Self-pay | Admitting: Pharmacist

## 2016-04-08 NOTE — Telephone Encounter (Signed)
Patient showed up for protime check today without appt. She was checked in and instructed that we would work her in.  She did not wait and left without being seen.  I tried to call patient to get her to come back in or to make appt for 1-2 days.

## 2016-04-08 NOTE — Progress Notes (Signed)
Patient showed up for protime today but didn't have appt.  She was placed in scheduled to be worked in however when I called her back she has left.  I tried to call her home to discuss coming back to to schedule appt for next 1-2 days.

## 2016-04-09 ENCOUNTER — Ambulatory Visit (INDEPENDENT_AMBULATORY_CARE_PROVIDER_SITE_OTHER): Payer: Medicare Other | Admitting: Pharmacist

## 2016-04-09 DIAGNOSIS — Z7901 Long term (current) use of anticoagulants: Secondary | ICD-10-CM

## 2016-04-09 DIAGNOSIS — Z86711 Personal history of pulmonary embolism: Secondary | ICD-10-CM

## 2016-04-09 LAB — COAGUCHEK XS/INR WAIVED
INR: 1.6 — ABNORMAL HIGH (ref 0.9–1.1)
Prothrombin Time: 19.2 s

## 2016-04-16 ENCOUNTER — Ambulatory Visit (INDEPENDENT_AMBULATORY_CARE_PROVIDER_SITE_OTHER): Payer: Medicare Other | Admitting: Pharmacist

## 2016-04-16 DIAGNOSIS — Z7901 Long term (current) use of anticoagulants: Secondary | ICD-10-CM

## 2016-04-16 DIAGNOSIS — Z86711 Personal history of pulmonary embolism: Secondary | ICD-10-CM

## 2016-04-16 LAB — COAGUCHEK XS/INR WAIVED
INR: 2 — ABNORMAL HIGH (ref 0.9–1.1)
Prothrombin Time: 24.6 s

## 2016-04-29 ENCOUNTER — Encounter: Payer: Self-pay | Admitting: Pharmacist

## 2016-04-29 ENCOUNTER — Other Ambulatory Visit: Payer: Self-pay | Admitting: Family Medicine

## 2016-04-30 ENCOUNTER — Encounter: Payer: Self-pay | Admitting: Family Medicine

## 2016-05-07 ENCOUNTER — Ambulatory Visit (INDEPENDENT_AMBULATORY_CARE_PROVIDER_SITE_OTHER): Payer: Medicare Other | Admitting: Pharmacist

## 2016-05-07 DIAGNOSIS — Z86711 Personal history of pulmonary embolism: Secondary | ICD-10-CM

## 2016-05-07 DIAGNOSIS — Z7901 Long term (current) use of anticoagulants: Secondary | ICD-10-CM | POA: Diagnosis not present

## 2016-05-07 LAB — COAGUCHEK XS/INR WAIVED
INR: 2.3 — ABNORMAL HIGH (ref 0.9–1.1)
Prothrombin Time: 27.7 s

## 2016-05-29 ENCOUNTER — Other Ambulatory Visit: Payer: Self-pay | Admitting: Pharmacist

## 2016-05-29 ENCOUNTER — Other Ambulatory Visit: Payer: Self-pay | Admitting: Family Medicine

## 2016-05-29 DIAGNOSIS — I2510 Atherosclerotic heart disease of native coronary artery without angina pectoris: Secondary | ICD-10-CM

## 2016-06-06 ENCOUNTER — Encounter: Payer: Self-pay | Admitting: Pharmacist

## 2016-06-28 ENCOUNTER — Other Ambulatory Visit: Payer: Self-pay | Admitting: Family Medicine

## 2016-06-28 DIAGNOSIS — N3281 Overactive bladder: Secondary | ICD-10-CM

## 2016-07-22 ENCOUNTER — Ambulatory Visit: Payer: Medicare Other | Admitting: Family Medicine

## 2016-07-25 ENCOUNTER — Ambulatory Visit (INDEPENDENT_AMBULATORY_CARE_PROVIDER_SITE_OTHER): Payer: Medicare Other | Admitting: Family Medicine

## 2016-07-25 ENCOUNTER — Encounter: Payer: Self-pay | Admitting: Family Medicine

## 2016-07-25 ENCOUNTER — Other Ambulatory Visit: Payer: Self-pay | Admitting: Family Medicine

## 2016-07-25 VITALS — BP 125/67 | HR 59 | Temp 97.6°F | Ht 62.5 in | Wt 187.4 lb

## 2016-07-25 DIAGNOSIS — L304 Erythema intertrigo: Secondary | ICD-10-CM | POA: Diagnosis not present

## 2016-07-25 DIAGNOSIS — Z86711 Personal history of pulmonary embolism: Secondary | ICD-10-CM

## 2016-07-25 DIAGNOSIS — Z7901 Long term (current) use of anticoagulants: Secondary | ICD-10-CM | POA: Diagnosis not present

## 2016-07-25 DIAGNOSIS — F0391 Unspecified dementia with behavioral disturbance: Secondary | ICD-10-CM

## 2016-07-25 DIAGNOSIS — E1121 Type 2 diabetes mellitus with diabetic nephropathy: Secondary | ICD-10-CM | POA: Diagnosis not present

## 2016-07-25 DIAGNOSIS — R102 Pelvic and perineal pain: Secondary | ICD-10-CM | POA: Diagnosis not present

## 2016-07-25 LAB — COAGUCHEK XS/INR WAIVED
INR: 3.5 — AB (ref 0.9–1.1)
Prothrombin Time: 42.6 s

## 2016-07-25 LAB — BAYER DCA HB A1C WAIVED: HB A1C (BAYER DCA - WAIVED): 4.9 % (ref <7.0)

## 2016-07-25 MED ORDER — RIVAROXABAN 10 MG PO TABS: 10.0000 mg | ORAL_TABLET | Freq: Every day | ORAL | 5 refills | Status: DC

## 2016-07-25 MED ORDER — QUETIAPINE FUMARATE 100 MG PO TABS: ORAL_TABLET | ORAL | 0 refills | Status: DC

## 2016-07-25 MED ORDER — NYSTATIN 100000 UNIT/GM EX CREA: 1.0000 "application " | TOPICAL_CREAM | Freq: Two times a day (BID) | CUTANEOUS | 0 refills | Status: DC

## 2016-07-25 NOTE — Progress Notes (Signed)
   HPI  Patient presents today for follow-up chronic medical conditions.  Patient has dementia, her daughter has moved in recently after the death of her oldest daughter. She is a CNA, she comes in complaining of sundowning. She explains that her mother gets very confused and talks a lot at night. She says things that she doesn't remember saying. She does not refer waking up. She rarely walks.  Her mother has been taking Coumadin more consistently without a problem.  Patient complains of Left Asis pain Been going on for a few months and seems to be getting worse.   Her daughter also complains of intertrigo-like rash under her breasts. She requests medication for this.  Her daughter is interested in changing from Coumadin to Xarelto. We discussed extensively the risks and benefits of both.  PMH: Smoking status noted ROS: Per HPI  Objective: BP 125/67   Pulse (!) 59   Temp 97.6 F (36.4 C) (Oral)   Ht 5' 2.5" (1.588 m)   Wt 187 lb 6.4 oz (85 kg)   BMI 33.73 kg/m  Gen: NAD, alert, cooperative with exam HEENT: NCAT CV: RRR, good S1/S2, no murmur Resp: CTABL, no wheezes, non-labored Abd: SNTND, BS present, no guarding or organomegaly Ext: No edema, warm Neuro: Alert and oriented, No gross deficits MSK:  Tenderness to  palpation over the left ASIS, milder tenderness to palpation over the right ASIS Mild tenderness to palpation of bilateral paraspinal muscles in the lumbar area,  Assessment and plan:  # Dementia with behavioral disturbance, sundowning Starting Seroquel, recommended against Ativan Discussed with her daughter the increased risk of all cause mortality with antipsychotics.  # Intertrigo Per history, new problem Nystatin cream  #Chronic anticoagulation, history of pulmonary embolism Changing from Coumadin to Xarelto Patient will take 15 mg Xarelto once a day until they get her prescription for 10 mg Xarelto to prevent recurrent PE (new indication at this  dose, this is nice also given borderline CrCl) Bigggest challenge previously has been consistent adjustment in coumadin dose  Creatinine clearance is 56 discussed routine renal monitoring. CBC  # Type 2 diabetes A1c pending No changes for now Clinically stable.  Pain in pelvis Pain at the bilateral anterior superior iliac spine, recommended ice, monitor.     Orders Placed This Encounter  Procedures  . CBC with Differential/Platelet  . CMP14+EGFR  . Bayer DCA Hb A1c Waived    Meds ordered this encounter  Medications  . QUEtiapine (SEROQUEL) 100 MG tablet    Sig: 0.5 tab on night 1, then 1 tab daily at night X 3 days, may then increase to 2 tabs daily at night.    Dispense:  60 tablet    Refill:  0  . rivaroxaban (XARELTO) 10 MG TABS tablet    Sig: Take 1 tablet (10 mg total) by mouth daily.    Dispense:  30 tablet    Refill:  5  . nystatin cream (MYCOSTATIN)    Sig: Apply 1 application topically 2 (two) times daily.    Dispense:  30 g    Refill:  [place], MD [facility] 07/25/2016, 5:20 PM

## 2016-07-25 NOTE — Patient Instructions (Addendum)
Great to see you!  It is ok to stop coumadin and start xarelto tomorrow. 10 mg once daily, if you cannot get it right away I would change coumadin to 1/2 tab daily until you start  Start seroquel:  1/2 tab tonight 1 tab daily X 3-4 days, if she is doing well stay there.  2 tabs per night, if doing well then stay there and call for a refill.   Nystatin is for the rash under the breasts  Try ice 15 minutes 4-5 times daily as needed for the pain in the pelvis

## 2016-07-26 LAB — CBC WITH DIFFERENTIAL/PLATELET
Basophils Absolute: 0 10*3/uL (ref 0.0–0.2)
Basos: 1 %
EOS (ABSOLUTE): 0.1 10*3/uL (ref 0.0–0.4)
EOS: 1 %
HEMATOCRIT: 36.3 % (ref 34.0–46.6)
Hemoglobin: 11.4 g/dL (ref 11.1–15.9)
IMMATURE GRANS (ABS): 0 10*3/uL (ref 0.0–0.1)
IMMATURE GRANULOCYTES: 1 %
LYMPHS ABS: 2.6 10*3/uL (ref 0.7–3.1)
LYMPHS: 40 %
MCH: 27.4 pg (ref 26.6–33.0)
MCHC: 31.4 g/dL — ABNORMAL LOW (ref 31.5–35.7)
MCV: 87 fL (ref 79–97)
MONOCYTES: 8 %
Monocytes Absolute: 0.5 10*3/uL (ref 0.1–0.9)
NEUTROS PCT: 49 %
Neutrophils Absolute: 3.2 10*3/uL (ref 1.4–7.0)
Platelets: 269 10*3/uL (ref 150–379)
RBC: 4.16 x10E6/uL (ref 3.77–5.28)
RDW: 14.6 % (ref 12.3–15.4)
WBC: 6.4 10*3/uL (ref 3.4–10.8)

## 2016-07-26 LAB — CMP14+EGFR
A/G RATIO: 1.4 (ref 1.2–2.2)
ALBUMIN: 4.2 g/dL (ref 3.6–4.8)
ALT: 19 IU/L (ref 0–32)
AST: 18 IU/L (ref 0–40)
Alkaline Phosphatase: 42 IU/L (ref 39–117)
BUN / CREAT RATIO: 20 (ref 12–28)
BUN: 26 mg/dL (ref 8–27)
Bilirubin Total: 0.3 mg/dL (ref 0.0–1.2)
CALCIUM: 9.6 mg/dL (ref 8.7–10.3)
CHLORIDE: 97 mmol/L (ref 96–106)
CO2: 26 mmol/L (ref 18–29)
Creatinine, Ser: 1.27 mg/dL — ABNORMAL HIGH (ref 0.57–1.00)
GFR, EST AFRICAN AMERICAN: 50 mL/min/{1.73_m2} — AB (ref >59)
GFR, EST NON AFRICAN AMERICAN: 43 mL/min/{1.73_m2} — AB (ref >59)
GLOBULIN, TOTAL: 3.1 g/dL (ref 1.5–4.5)
Glucose: 76 mg/dL (ref 65–99)
POTASSIUM: 4.5 mmol/L (ref 3.5–5.2)
Sodium: 144 mmol/L (ref 134–144)
TOTAL PROTEIN: 7.3 g/dL (ref 6.0–8.5)

## 2016-07-29 ENCOUNTER — Other Ambulatory Visit: Payer: Self-pay | Admitting: Family Medicine

## 2016-07-29 ENCOUNTER — Encounter: Payer: Self-pay | Admitting: Family Medicine

## 2016-07-31 ENCOUNTER — Other Ambulatory Visit: Payer: Self-pay | Admitting: Family Medicine

## 2016-08-01 ENCOUNTER — Emergency Department (HOSPITAL_COMMUNITY): Payer: Medicare Other

## 2016-08-01 ENCOUNTER — Encounter (HOSPITAL_COMMUNITY): Payer: Self-pay | Admitting: Emergency Medicine

## 2016-08-01 ENCOUNTER — Emergency Department (HOSPITAL_COMMUNITY)
Admission: EM | Admit: 2016-08-01 | Discharge: 2016-08-01 | Disposition: A | Discharge status: Home / Self Care | Payer: Medicare Other | Attending: Emergency Medicine | Admitting: Emergency Medicine

## 2016-08-01 DIAGNOSIS — Z7984 Long term (current) use of oral hypoglycemic drugs: Secondary | ICD-10-CM | POA: Insufficient documentation

## 2016-08-01 DIAGNOSIS — Y939 Activity, unspecified: Secondary | ICD-10-CM | POA: Insufficient documentation

## 2016-08-01 DIAGNOSIS — I509 Heart failure, unspecified: Secondary | ICD-10-CM | POA: Diagnosis not present

## 2016-08-01 DIAGNOSIS — I130 Hypertensive heart and chronic kidney disease with heart failure and stage 1 through stage 4 chronic kidney disease, or unspecified chronic kidney disease: Secondary | ICD-10-CM | POA: Diagnosis not present

## 2016-08-01 DIAGNOSIS — J45909 Unspecified asthma, uncomplicated: Secondary | ICD-10-CM | POA: Insufficient documentation

## 2016-08-01 DIAGNOSIS — S8992XA Unspecified injury of left lower leg, initial encounter: Secondary | ICD-10-CM | POA: Diagnosis not present

## 2016-08-01 DIAGNOSIS — J449 Chronic obstructive pulmonary disease, unspecified: Secondary | ICD-10-CM | POA: Diagnosis not present

## 2016-08-01 DIAGNOSIS — N183 Chronic kidney disease, stage 3 (moderate): Secondary | ICD-10-CM | POA: Insufficient documentation

## 2016-08-01 DIAGNOSIS — W0110XA Fall on same level from slipping, tripping and stumbling with subsequent striking against unspecified object, initial encounter: Secondary | ICD-10-CM | POA: Diagnosis not present

## 2016-08-01 DIAGNOSIS — E039 Hypothyroidism, unspecified: Secondary | ICD-10-CM | POA: Diagnosis not present

## 2016-08-01 DIAGNOSIS — S8002XA Contusion of left knee, initial encounter: Secondary | ICD-10-CM | POA: Insufficient documentation

## 2016-08-01 DIAGNOSIS — Y929 Unspecified place or not applicable: Secondary | ICD-10-CM | POA: Diagnosis not present

## 2016-08-01 DIAGNOSIS — E1122 Type 2 diabetes mellitus with diabetic chronic kidney disease: Secondary | ICD-10-CM | POA: Diagnosis not present

## 2016-08-01 DIAGNOSIS — S0083XA Contusion of other part of head, initial encounter: Secondary | ICD-10-CM | POA: Diagnosis not present

## 2016-08-01 DIAGNOSIS — Z87891 Personal history of nicotine dependence: Secondary | ICD-10-CM | POA: Insufficient documentation

## 2016-08-01 DIAGNOSIS — Z91040 Latex allergy status: Secondary | ICD-10-CM | POA: Insufficient documentation

## 2016-08-01 DIAGNOSIS — Y999 Unspecified external cause status: Secondary | ICD-10-CM | POA: Diagnosis not present

## 2016-08-01 DIAGNOSIS — S0990XA Unspecified injury of head, initial encounter: Secondary | ICD-10-CM | POA: Diagnosis present

## 2016-08-01 DIAGNOSIS — Z79899 Other long term (current) drug therapy: Secondary | ICD-10-CM | POA: Diagnosis not present

## 2016-08-01 DIAGNOSIS — S40011A Contusion of right shoulder, initial encounter: Secondary | ICD-10-CM | POA: Insufficient documentation

## 2016-08-01 DIAGNOSIS — S199XXA Unspecified injury of neck, initial encounter: Secondary | ICD-10-CM | POA: Diagnosis not present

## 2016-08-01 DIAGNOSIS — W19XXXA Unspecified fall, initial encounter: Secondary | ICD-10-CM

## 2016-08-01 DIAGNOSIS — M25562 Pain in left knee: Secondary | ICD-10-CM | POA: Diagnosis not present

## 2016-08-01 DIAGNOSIS — S80212A Abrasion, left knee, initial encounter: Secondary | ICD-10-CM | POA: Diagnosis not present

## 2016-08-01 DIAGNOSIS — M25511 Pain in right shoulder: Secondary | ICD-10-CM | POA: Diagnosis not present

## 2016-08-01 DIAGNOSIS — S069X9A Unspecified intracranial injury with loss of consciousness of unspecified duration, initial encounter: Secondary | ICD-10-CM | POA: Diagnosis not present

## 2016-08-01 HISTORY — DX: Unspecified dementia, unspecified severity, without behavioral disturbance, psychotic disturbance, mood disturbance, and anxiety: F03.90

## 2016-08-01 LAB — I-STAT CHEM 8, ED
BUN: 38 mg/dL — ABNORMAL HIGH (ref 6–20)
CHLORIDE: 96 mmol/L — AB (ref 101–111)
CREATININE: 1.5 mg/dL — AB (ref 0.44–1.00)
Calcium, Ion: 1.17 mmol/L (ref 1.15–1.40)
GLUCOSE: 137 mg/dL — AB (ref 65–99)
HEMATOCRIT: 37 % (ref 36.0–46.0)
HEMOGLOBIN: 12.6 g/dL (ref 12.0–15.0)
POTASSIUM: 3.9 mmol/L (ref 3.5–5.1)
Sodium: 138 mmol/L (ref 135–145)
TCO2: 30 mmol/L (ref 0–100)

## 2016-08-01 NOTE — ED Triage Notes (Addendum)
Pt reports slipping and falling, injuring head and right elbow. Pt has abrasion to R elbow and left knee  Pt did have LOC. Pt alert and oriented at this time. Pt just switched from coumadin to xarelto 6 days ago.

## 2016-08-01 NOTE — ED Provider Notes (Signed)
[facility]Y DEPT Provider Note   CSN: [id number] Arrival date & time: 08/01/16  1738     History   Chief Complaint Chief Complaint  Patient presents with  . Fall    HPI [name] is a 71 y.o. female.  Patient states that she tripped and fell hit her head right shoulder left knee.  Patient had loss of consciousness for a couple minutes   The history is provided by the patient. No language interpreter was used.  Fall  This is a new problem. The current episode started 3 to 5 hours ago. The problem occurs rarely. The problem has been resolved. Pertinent negatives include no chest pain, no abdominal pain and no headaches. Nothing aggravates the symptoms. Nothing relieves the symptoms.    Past Medical History:  Diagnosis Date  . Anemia   . Anxiety   . Anxiety and depression, ( ? early dementia) 08/14/2011  . Arthritis   . Asthma   . Blood transfusion 2007   History of  . Chronic kidney disease    lt kidney limited fxn  . Chronic pain   . COPD (chronic obstructive pulmonary disease) with emphysema (HCC)   . DDD (degenerative disc disease)   . Dementia   . Depression   . Diabetes mellitus   . Gastroesophageal reflux disease   . Headache(784.0)   . Heart murmur    No significant valvular lesions on echo  . Heart palpitations   . History of CHF (congestive heart failure) 2007   2013: Normal EF by echo x2 with no significant diastolic dysfunction noted either  . Hyperlipidemia   . Hypertension   . Hypothyroidism (acquired)    On Levothyroxine  . Myocardial infarction   . Osteoporosis   . PE (pulmonary embolism) February 2007; March 2013   Bilateral PEs in 07; massive PE March 2013 with moderate elevation in PA pressures.  . Seizures (HCC)    once  . Uterine prolapse     Patient Active Problem List   Diagnosis Date Noted  . Fracture of right ankle, lateral malleolus 09/01/2015  . Fracture of fibula, right, closed 08/15/2015  . Long term current use of  anticoagulant therapy 07/27/2015  . CKD stage 3 due to type 2 diabetes mellitus (HCC) 07/27/2015  . Healthcare maintenance 06/30/2015  . Decreased hearing 06/30/2015  . Altered mental status 05/23/2015  . Hypothyroidism 05/23/2015  . OAB (overactive bladder) 04/04/2015  . Hematemesis 02/28/2015  . Gastroesophageal reflux disease without esophagitis 07/29/2014  . Hypokalemia 07/29/2014  . Type 2 diabetes with nephropathy (HCC) 04/21/2014  . Obesity (BMI 30-39.9) 03/25/2013  . Chronic anticoagulation, pt will need lifelong anticoagulation 08/20/2011  . Pulmonary HTN on CTA this admission 08/20/2011  . Anxiety and depression, ( ? early dementia) 08/14/2011  . Abnormal chest x-ray 12/10/2010  . Palpitations 12/10/2010  . Hyperlipidemia with target LDL less than 100 11/23/2008  . Essential hypertension, benign 11/23/2008  . CHEST PAIN, PRECORDIAL,  11/23/2008  . Anemia 11/19/2008  . Hx pulmonary embolism 11/19/2008  . COPD mixed type (HCC) 11/19/2008    Past Surgical History:  Procedure Laterality Date  . CARDIAC CATHETERIZATION  March 2013   No evidence of coronary disease; also no significant aortic or iliac or renal artery disease.  . Hiatel Hernia Repair    . LEFT HEART CATHETERIZATION WITH CORONARY ANGIOGRAM N/A 08/16/2011   Procedure: LEFT HEART CATHETERIZATION WITH CORONARY ANGIOGRAM;  Surgeon: Marykay Lex, MD;  Location: [facility] CATH LAB;  Service:  Cardiovascular;  Laterality: N/A;  . TUBAL LIGATION      OB History    No data available       Home Medications    Prior to Admission medications   Medication Sig Start Date End Date Taking? Authorizing Provider  albuterol (PROAIR HFA) 108 (90 Base) MCG/ACT inhaler Inhale 2 puffs into the lungs every 6 (six) hours as needed for wheezing or shortness of breath. 03/19/16  Yes Mary-Margaret Daphine Deutscher, FNP  Cholecalciferol (VITAMIN D3) 5000 UNITS CAPS Take 5,000 Units by mouth daily.   Yes Historical Provider, MD  Choline  Fenofibrate (FENOFIBRIC ACID) 135 MG CPDR TAKE 1 CAPSULE BY MOUTH EVERY DAY 05/29/16  Yes Elenora Gamma, MD  donepezil (ARICEPT) 5 MG tablet Take 1 tablet (5 mg total) by mouth at bedtime. 12/27/15  Yes Elenora Gamma, MD  DULoxetine (CYMBALTA) 30 MG capsule TAKE 1 CAPSULE BY MOUTH EVERY DAY 06/28/16  Yes Elenora Gamma, MD  furosemide (LASIX) 40 MG tablet TAKE 1/2 OF A TABLET BY MOUTH EVERY DAY 05/29/16  Yes Elenora Gamma, MD  gabapentin (NEURONTIN) 300 MG capsule TAKE 1 CAPSULE BY MOUTH TWICE DAILY 07/31/16  Yes Elenora Gamma, MD  glimepiride (AMARYL) 2 MG tablet TAKE 1 TABLET BY MOUTH EVERY DAY BEFORE BREAKFAST 06/28/16  Yes Elenora Gamma, MD  levothyroxine (SYNTHROID, LEVOTHROID) 88 MCG tablet TAKE 1 TABLET BY MOUTH EVERY MORNING BEFORE BREAKFAST 06/28/16  Yes Elenora Gamma, MD  losartan (COZAAR) 25 MG tablet TAKE 1 TABLET BY MOUTH EVERY DAY 07/31/16  Yes Elenora Gamma, MD  meclizine (ANTIVERT) 50 MG tablet Take 50 mg by mouth daily. Reported on 09/27/2015   Yes Historical Provider, MD  metFORMIN (GLUCOPHAGE) 500 MG tablet TAKE 1 TABLET BY MOUTH TWICE DAILY WITH A MEAL 06/28/16  Yes Elenora Gamma, MD  metoprolol succinate (TOPROL-XL) 25 MG 24 hr tablet TAKE 1/2 OF A TABLET BY MOUTH EVERY DAY 05/29/16  Yes Elenora Gamma, MD  nystatin cream (MYCOSTATIN) Apply 1 application topically 2 (two) times daily. 07/25/16  Yes Elenora Gamma, MD  Omega-3 Fatty Acids (FISH OIL) 1000 MG CAPS Take 1 capsule by mouth every morning.   Yes Historical Provider, MD  oxybutynin (DITROPAN) 5 MG tablet TAKE 1 TABLET BY MOUTH THREE TIMES DAILY 06/28/16  Yes Elenora Gamma, MD  pantoprazole (PROTONIX) 20 MG tablet TAKE 1 TABLET BY MOUTH TWICE DAILY 04/30/16  Yes Elenora Gamma, MD  potassium chloride SA (K-DUR,KLOR-CON) 20 MEQ tablet TAKE 1 TABLET BY MOUTH EVERY MORNING 06/28/16  Yes Elenora Gamma, MD  QUEtiapine (SEROQUEL) 100 MG tablet 0.5 tab on night 1, then 1 tab daily at  night X 3 days, may then increase to 2 tabs daily at night. Patient taking differently: Take 200 mg by mouth at bedtime.  07/25/16  Yes Elenora Gamma, MD  rivaroxaban (XARELTO) 10 MG TABS tablet Take 1 tablet (10 mg total) by mouth daily. 07/25/16  Yes Elenora Gamma, MD  simvastatin (ZOCOR) 40 MG tablet TAKE 1 TABLET BY MOUTH AT BEDTIME 06/28/16  Yes Elenora Gamma, MD  ACCU-CHEK AVIVA PLUS test strip USE TO CHECK BLOOD GLUCOSE UP TO TWICE DAILY . DX: E11.9 TYPE 2 DIABETES 07/26/16   Elenora Gamma, MD  ACCU-CHEK SOFTCLIX LANCETS lancets USE TO CHECK BLOOD SUGAR UP TO TWICE DAILY . DX: E11.9 - TYPE 2 DIABETES MELLITUS 07/26/16   Elenora Gamma, MD    Family History Family History  Problem Relation Age of Onset  . Heart disease Mother   . Stroke Mother   . Prostate cancer Father   . Cancer Father     bone  . COPD Sister   . Heart disease Sister   . Goiter Sister   . Cancer Sister     THYROID  . Early death Brother 8  . Cancer Brother     leukemia  . Liver cancer Brother   . Alzheimer's disease      family history  . Cancer Daughter     breast  . Cancer Paternal Uncle     unsure of what kind    Social History Social History  Substance Use Topics  . Smoking status: Former Smoker    Packs/day: 2.00    Years: 40.00    Types: Cigarettes    Quit date: 03/23/2001  . Smokeless tobacco: Never Used     Comment: EXPOSED TO PASSIVE SMOKE  . Alcohol use No     Allergies   Procaine hcl; Codeine; Latex; and Penicillins   Review of Systems Review of Systems  Constitutional: Negative for appetite change and fatigue.  HENT: Negative for congestion, ear discharge and sinus pressure.        Headache  Eyes: Negative for discharge.  Respiratory: Negative for cough.   Cardiovascular: Negative for chest pain.  Gastrointestinal: Negative for abdominal pain and diarrhea.  Genitourinary: Negative for frequency and hematuria.  Musculoskeletal: Negative for back pain.        Right shoulder and left knee pain  Skin: Negative for rash.  Neurological: Negative for seizures and headaches.  Psychiatric/Behavioral: Negative for hallucinations.     Physical Exam Updated Vital Signs BP 110/66 (BP Location: Right Arm)   Pulse 60   Temp 98.4 F (36.9 C) (Oral)   Resp 18   Ht 5\' 3"  (1.6 m)   Wt 187 lb (84.8 kg)   SpO2 96%   BMI 33.13 kg/m   Physical Exam  Constitutional: She is oriented to person, place, and time. She appears well-developed.  HENT:  Head: Normocephalic.  Patient with bruising to right forehead  Eyes: Conjunctivae and EOM are normal. No scleral icterus.  Neck: Neck supple. No thyromegaly present.  Cardiovascular: Normal rate and regular rhythm.  Exam reveals no gallop and no friction rub.   No murmur heard. Pulmonary/Chest: No stridor. She has no wheezes. She has no rales. She exhibits no tenderness.  Abdominal: She exhibits no distension. There is no tenderness. There is no rebound.  Musculoskeletal: Normal range of motion. She exhibits no edema.  Minor abrasion and bruising to right shoulder and left knee  Lymphadenopathy:    She has no cervical adenopathy.  Neurological: She is alert and oriented to person, place, and time. She exhibits normal muscle tone. Coordination normal.  Skin: No rash noted. No erythema.  Psychiatric: She has a normal mood and affect. Her behavior is normal.     ED Treatments / Results  Labs (all labs ordered are listed, but only abnormal results are displayed) Labs Reviewed  I-STAT CHEM 8, ED - Abnormal; Notable for the following:       Result Value   Chloride 96 (*)    BUN 38 (*)    Creatinine, Ser 1.50 (*)    Glucose, Bld 137 (*)    All other components within normal limits    EKG  EKG Interpretation None       Radiology Dg Shoulder Right  Result Date:  08/01/2016 CLINICAL DATA:  Right shoulder pain after tripping and falling. EXAM: RIGHT SHOULDER - 2+ VIEW COMPARISON:  None. FINDINGS:  There is no evidence of fracture or dislocation. There is no evidence of arthropathy or other focal bone abnormality. Soft tissues are unremarkable. Calcified granuloma right lung stable since chest x-ray of 05/23/2015. IMPRESSION: Negative. Electronically Signed   By: Kennith Center M.D.   On: 08/01/2016 19:03   Ct Head Wo Contrast  Result Date: 08/01/2016 CLINICAL DATA:  Patient slipped and fell injuring her head. Positive loss of consciousness. EXAM: CT HEAD WITHOUT CONTRAST CT CERVICAL SPINE WITHOUT CONTRAST TECHNIQUE: Multidetector CT imaging of the head and cervical spine was performed following the standard protocol without intravenous contrast. Multiplanar CT image reconstructions of the cervical spine were also generated. COMPARISON:  Head CT 01/03/2016. FINDINGS: CT HEAD FINDINGS Brain: There is no evidence for acute hemorrhage, hydrocephalus, mass lesion, or abnormal extra-axial fluid collection. No definite CT evidence for acute infarction. Vascular: Atherosclerotic calcification is visualized in the carotid arteries. No dense MCA sign. Major dural sinuses are unremarkable. Skull: No evidence for fracture. No worrisome lytic or sclerotic lesion. Sinuses/Orbits: The visualized paranasal sinuses and mastoid air cells are clear. Visualized portions of the globes and intraorbital fat are unremarkable. Other: Soft tissue swelling noted right frontal scalp. CT CERVICAL SPINE FINDINGS Alignment: Reversal of normal cervical lordosis evident. Skull base and vertebrae: Motion degraded study without evidence for cervical spine fracture. Soft tissues and spinal canal: No prevertebral fluid or swelling. No visible canal hematoma. Disc levels: Trace retrolisthesis of C5 on 6 compatible with loss of disc height and relative sparing of the facet joints. Degenerative changes are noted at the C6-7 level is well. Upper chest: Negative. Other: None. IMPRESSION: 1. No acute intracranial abnormality. 2. Right frontal scalp  swelling. 3. Degenerative changes cervical spine without fracture. Electronically Signed   By: Kennith Center M.D.   On: 08/01/2016 19:11   Ct Cervical Spine Wo Contrast  Result Date: 08/01/2016 CLINICAL DATA:  Patient slipped and fell injuring her head. Positive loss of consciousness. EXAM: CT HEAD WITHOUT CONTRAST CT CERVICAL SPINE WITHOUT CONTRAST TECHNIQUE: Multidetector CT imaging of the head and cervical spine was performed following the standard protocol without intravenous contrast. Multiplanar CT image reconstructions of the cervical spine were also generated. COMPARISON:  Head CT 01/03/2016. FINDINGS: CT HEAD FINDINGS Brain: There is no evidence for acute hemorrhage, hydrocephalus, mass lesion, or abnormal extra-axial fluid collection. No definite CT evidence for acute infarction. Vascular: Atherosclerotic calcification is visualized in the carotid arteries. No dense MCA sign. Major dural sinuses are unremarkable. Skull: No evidence for fracture. No worrisome lytic or sclerotic lesion. Sinuses/Orbits: The visualized paranasal sinuses and mastoid air cells are clear. Visualized portions of the globes and intraorbital fat are unremarkable. Other: Soft tissue swelling noted right frontal scalp. CT CERVICAL SPINE FINDINGS Alignment: Reversal of normal cervical lordosis evident. Skull base and vertebrae: Motion degraded study without evidence for cervical spine fracture. Soft tissues and spinal canal: No prevertebral fluid or swelling. No visible canal hematoma. Disc levels: Trace retrolisthesis of C5 on 6 compatible with loss of disc height and relative sparing of the facet joints. Degenerative changes are noted at the C6-7 level is well. Upper chest: Negative. Other: None. IMPRESSION: 1. No acute intracranial abnormality. 2. Right frontal scalp swelling. 3. Degenerative changes cervical spine without fracture. Electronically Signed   By: Kennith Center M.D.   On: 08/01/2016 19:11   Dg Knee Complete 4  Views Left  Result Date: 08/01/2016 CLINICAL DATA:  Status post fall, with left knee pain. Initial encounter. EXAM: LEFT KNEE - COMPLETE 4+ VIEW COMPARISON:  None. FINDINGS: There is no evidence of fracture or dislocation. The joint spaces are preserved. No significant degenerative change is seen; the patellofemoral joint is grossly unremarkable in appearance. No significant joint effusion is seen. The visualized soft tissues are normal in appearance. IMPRESSION: No evidence of fracture or dislocation. Electronically Signed   By: Roanna Raider M.D.   On: 08/01/2016 19:02    Procedures Procedures (including critical care time)  Medications Ordered in ED Medications - No data to display   Initial Impression / Assessment and Plan / ED Course  I have reviewed the triage vital signs and the nursing notes.  Pertinent labs & imaging results that were available during my care of the patient were reviewed by me and considered in my medical decision making (see chart for details).     Patient with diagnosis of fall head injury contusion to right shoulder and contusion left knee x-rays were all unremarkable. Patient was told to take Tylenol for pain and follow-up with her PCP as needed  Final Clinical Impressions(s) / ED Diagnoses   Final diagnoses:  Fall, initial encounter    New Prescriptions New Prescriptions   No medications on file     Bethann Berkshire, MD 08/01/16 2029

## 2016-08-01 NOTE — Discharge Instructions (Signed)
Take Tylenol for pain. Follow-up with her family doctor if any problems

## 2016-08-02 ENCOUNTER — Telehealth: Payer: Self-pay

## 2016-08-02 ENCOUNTER — Telehealth: Payer: Self-pay | Admitting: Family Medicine

## 2016-08-02 MED ORDER — QUETIAPINE FUMARATE 200 MG PO TABS
200.0000 mg | ORAL_TABLET | Freq: Every day | ORAL | 0 refills | Status: DC
Start: 2016-08-02 — End: 2016-08-27

## 2016-08-02 NOTE — Telephone Encounter (Signed)
Husband states he thinks she is taking 2 tab each night of the Seroquel. FYI

## 2016-08-02 NOTE — Telephone Encounter (Signed)
Patient and husband aware jkp 2/23

## 2016-08-02 NOTE — Telephone Encounter (Signed)
lmtcb- Needing to know patients seroquel dose.

## 2016-08-02 NOTE — Telephone Encounter (Signed)
Refilled  Murtis SinkSam Bradshaw, MD [facility] 08/02/2016, 12:34 PM

## 2016-08-06 ENCOUNTER — Other Ambulatory Visit: Payer: Self-pay | Admitting: *Deleted

## 2016-08-22 ENCOUNTER — Ambulatory Visit: Payer: Medicare Other | Admitting: Family Medicine

## 2016-08-26 ENCOUNTER — Encounter: Payer: Self-pay | Admitting: Family Medicine

## 2016-08-27 ENCOUNTER — Other Ambulatory Visit: Payer: Self-pay | Admitting: Family Medicine

## 2016-09-03 ENCOUNTER — Ambulatory Visit (INDEPENDENT_AMBULATORY_CARE_PROVIDER_SITE_OTHER): Payer: Medicare Other | Admitting: Family Medicine

## 2016-09-03 ENCOUNTER — Encounter: Payer: Self-pay | Admitting: Family Medicine

## 2016-09-03 VITALS — BP 121/50 | HR 54 | Temp 97.3°F | Ht 62.5 in | Wt 186.4 lb

## 2016-09-03 DIAGNOSIS — Z1211 Encounter for screening for malignant neoplasm of colon: Secondary | ICD-10-CM

## 2016-09-03 DIAGNOSIS — E785 Hyperlipidemia, unspecified: Secondary | ICD-10-CM | POA: Diagnosis not present

## 2016-09-03 DIAGNOSIS — E1121 Type 2 diabetes mellitus with diabetic nephropathy: Secondary | ICD-10-CM | POA: Diagnosis not present

## 2016-09-03 DIAGNOSIS — I10 Essential (primary) hypertension: Secondary | ICD-10-CM | POA: Diagnosis not present

## 2016-09-03 DIAGNOSIS — E039 Hypothyroidism, unspecified: Secondary | ICD-10-CM | POA: Diagnosis not present

## 2016-09-03 MED ORDER — NYSTATIN 100000 UNIT/GM EX CREA: 1.0000 "application " | TOPICAL_CREAM | Freq: Two times a day (BID) | CUTANEOUS | 3 refills | Status: DC

## 2016-09-03 NOTE — Patient Instructions (Signed)
Great to see you!  I want to be sure that you have stopped amaryl, your last blood sugar test ( A1C) was so good that it is best to be off of the medicine.   We will call with labs within 1 week.

## 2016-09-03 NOTE — Addendum Note (Signed)
Addended by: Angela AdamOSTOSKY, Yony Roulston C on: 09/03/2016 02:21 PM   Modules accepted: Orders
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
## 2016-10-02 ENCOUNTER — Other Ambulatory Visit: Payer: Self-pay | Admitting: Family Medicine

## 2016-11-01 ENCOUNTER — Other Ambulatory Visit: Payer: Self-pay | Admitting: Family Medicine

## 2016-11-07 ENCOUNTER — Telehealth: Payer: Self-pay | Admitting: Family Medicine

## 2016-11-07 NOTE — Telephone Encounter (Signed)
Spoke with daughter, April.  She is asking if the provider will see her mother again.  She was dismissed for no shows and daughter is trying to take care of her now.  She has become worse with her condition (dementia).  Daughter will make sure she does not miss any more appointments and will be with her for her appointments.  Please let her know if you will please.

## 2016-11-08 NOTE — Telephone Encounter (Signed)
Ok to see again.   Murtis SinkSam Xavia Kniskern, MD [facility] 11/08/2016, 11:48 AM

## 2016-11-08 NOTE — Telephone Encounter (Signed)
appt scheduled for 11/13/2016 Pt's daughter notified Okayed per Dr Ermalinda MemosBradshaw

## 2016-11-13 ENCOUNTER — Ambulatory Visit (INDEPENDENT_AMBULATORY_CARE_PROVIDER_SITE_OTHER): Payer: Medicare Other | Admitting: Family Medicine

## 2016-11-13 ENCOUNTER — Encounter: Payer: Self-pay | Admitting: Family Medicine

## 2016-11-13 VITALS — BP 118/75 | HR 64 | Temp 98.3°F | Ht 62.5 in | Wt 185.4 lb

## 2016-11-13 DIAGNOSIS — F0390 Unspecified dementia without behavioral disturbance: Secondary | ICD-10-CM | POA: Insufficient documentation

## 2016-11-13 DIAGNOSIS — F0391 Unspecified dementia with behavioral disturbance: Secondary | ICD-10-CM

## 2016-11-13 DIAGNOSIS — E1121 Type 2 diabetes mellitus with diabetic nephropathy: Secondary | ICD-10-CM

## 2016-11-13 DIAGNOSIS — Z86711 Personal history of pulmonary embolism: Secondary | ICD-10-CM

## 2016-11-13 LAB — BAYER DCA HB A1C WAIVED: HB A1C (BAYER DCA - WAIVED): 7.5 % — ABNORMAL HIGH (ref <7.0)

## 2016-11-13 MED ORDER — DONEPEZIL HCL 10 MG PO TABS: 10.0000 mg | ORAL_TABLET | Freq: Every day | ORAL | 5 refills | Status: DC

## 2016-11-13 NOTE — Progress Notes (Signed)
   HPI  Patient presents today here to reestablish care with chronic medical conditions.  Dimension Memory loss since beginning worse, they're more behavioral disturbances, she was previously having difficulty at night, Seroquel was started and she had improvement. She has had 2 recent events where she left the house, however her daughter caught her quickly. She has episodes of confusion and very frequent short-term memory loss. She is still able to toilet, walk, dress herself.  She's taking Aricept 5 mg without a problem.  She has acute episodes of agitation, her daughter requests Ativan for this.  Type 2 diabetes Still watching diet No medications   Still taking Xarelto, has had a few nosebleeds, she stopped taking it if this happens and they have not been a persistent problem.  PMH: Smoking status noted ROS: Per HPI  Objective: BP 118/75   Pulse 64   Temp 98.3 F (36.8 C) (Oral)   Ht 5' 2.5" (1.588 m)   Wt 185 lb 6.4 oz (84.1 kg)   BMI 33.37 kg/m  Gen: NAD, alert, cooperative with exam HEENT: NCAT CV: RRR, good S1/S2, no murmur Resp: CTABL, no wheezes, non-labored Ext: No edema, warm Neuro: Alert and oriented, No gross deficits  Assessment and plan:  # Type 2 diabetes Diet controlled, A1c today   # Dementia Worsening, now with more behavioral disturbance Continue Seroquel Declined Ativan Titrate Aricept, refer to neurology  # History of pulmonary embolism Chronically anticoagulated with Xarelto, repeat CBC Discussed red flags for nosebleeds    Orders Placed This Encounter  Procedures  . CBC with Differential/Platelet  . CMP14+EGFR  . Bayer DCA Hb A1c Waived  . Ambulatory referral to Neurology    Referral Priority:   Routine    Referral Type:   Consultation    Referral Reason:   Specialty Services Required    Requested Specialty:   Neurology    Number of Visits Requested:   1    Meds ordered this encounter  Medications  . donepezil (ARICEPT)  10 MG tablet    Sig: Take 1 tablet (10 mg total) by mouth at bedtime.    Dispense:  30 tablet    Refill:  [place], MD [place] 11/13/2016, 1:32 PM

## 2016-11-13 NOTE — Patient Instructions (Signed)
Great to see you!  We will work on a referral to neurology  I usually send [facility]

## 2016-11-14 ENCOUNTER — Telehealth: Payer: Self-pay | Admitting: Family Medicine

## 2016-11-14 LAB — CMP14+EGFR
A/G RATIO: 1.4 (ref 1.2–2.2)
ALT: 13 IU/L (ref 0–32)
AST: 13 IU/L (ref 0–40)
Albumin: 4 g/dL (ref 3.6–4.8)
Alkaline Phosphatase: 67 IU/L (ref 39–117)
BUN/Creatinine Ratio: 14 (ref 12–28)
BUN: 19 mg/dL (ref 8–27)
Bilirubin Total: 0.4 mg/dL (ref 0.0–1.2)
CALCIUM: 9.6 mg/dL (ref 8.7–10.3)
CO2: 26 mmol/L (ref 18–29)
Chloride: 101 mmol/L (ref 96–106)
Creatinine, Ser: 1.35 mg/dL — ABNORMAL HIGH (ref 0.57–1.00)
GFR calc non Af Amer: 40 mL/min/{1.73_m2} — ABNORMAL LOW (ref >59)
GFR, EST AFRICAN AMERICAN: 47 mL/min/{1.73_m2} — AB (ref >59)
GLOBULIN, TOTAL: 2.8 g/dL (ref 1.5–4.5)
Glucose: 154 mg/dL — ABNORMAL HIGH (ref 65–99)
POTASSIUM: 4.5 mmol/L (ref 3.5–5.2)
Sodium: 141 mmol/L (ref 134–144)
TOTAL PROTEIN: 6.8 g/dL (ref 6.0–8.5)

## 2016-11-14 LAB — CBC WITH DIFFERENTIAL/PLATELET
BASOS: 0 %
Basophils Absolute: 0 10*3/uL (ref 0.0–0.2)
EOS (ABSOLUTE): 0.1 10*3/uL (ref 0.0–0.4)
EOS: 2 %
HEMATOCRIT: 38.1 % (ref 34.0–46.6)
Hemoglobin: 12.6 g/dL (ref 11.1–15.9)
Immature Grans (Abs): 0 10*3/uL (ref 0.0–0.1)
Immature Granulocytes: 1 %
LYMPHS ABS: 2 10*3/uL (ref 0.7–3.1)
Lymphs: 27 %
MCH: 28.5 pg (ref 26.6–33.0)
MCHC: 33.1 g/dL (ref 31.5–35.7)
MCV: 86 fL (ref 79–97)
MONOS ABS: 0.6 10*3/uL (ref 0.1–0.9)
Monocytes: 8 %
Neutrophils Absolute: 4.6 10*3/uL (ref 1.4–7.0)
Neutrophils: 62 %
PLATELETS: 269 10*3/uL (ref 150–379)
RBC: 4.42 x10E6/uL (ref 3.77–5.28)
RDW: 14.1 % (ref 12.3–15.4)
WBC: 7.5 10*3/uL (ref 3.4–10.8)

## 2016-11-14 NOTE — Telephone Encounter (Signed)
Patient aware of results.

## 2016-11-18 ENCOUNTER — Encounter: Payer: Self-pay | Admitting: Neurology

## 2016-11-21 ENCOUNTER — Other Ambulatory Visit: Payer: Self-pay | Admitting: Family Medicine

## 2016-12-05 ENCOUNTER — Ambulatory Visit: Payer: Medicare Other | Admitting: Neurology

## 2016-12-23 ENCOUNTER — Other Ambulatory Visit: Payer: Self-pay | Admitting: Family Medicine

## 2016-12-23 DIAGNOSIS — N3281 Overactive bladder: Secondary | ICD-10-CM

## 2016-12-27 ENCOUNTER — Ambulatory Visit: Payer: Medicare Other | Admitting: Neurology

## 2017-01-13 ENCOUNTER — Ambulatory Visit: Payer: Medicare Other | Admitting: Family Medicine

## 2017-01-14 ENCOUNTER — Telehealth: Payer: Self-pay | Admitting: Family Medicine

## 2017-01-14 ENCOUNTER — Encounter: Payer: Self-pay | Admitting: Family Medicine

## 2017-01-14 NOTE — Telephone Encounter (Signed)
Patient states she forgot about appointment.

## 2017-01-16 ENCOUNTER — Ambulatory Visit: Payer: Medicare Other | Admitting: Family Medicine

## 2017-01-17 ENCOUNTER — Encounter: Payer: Self-pay | Admitting: Family Medicine

## 2017-01-20 ENCOUNTER — Other Ambulatory Visit: Payer: Self-pay | Admitting: Family Medicine

## 2017-01-20 DIAGNOSIS — I2510 Atherosclerotic heart disease of native coronary artery without angina pectoris: Secondary | ICD-10-CM

## 2017-01-21 ENCOUNTER — Ambulatory Visit (INDEPENDENT_AMBULATORY_CARE_PROVIDER_SITE_OTHER): Payer: Medicare Other | Admitting: Family Medicine

## 2017-01-21 ENCOUNTER — Encounter: Payer: Self-pay | Admitting: Family Medicine

## 2017-01-21 VITALS — BP 128/68 | HR 64 | Temp 97.7°F | Ht 62.5 in | Wt 174.6 lb

## 2017-01-21 DIAGNOSIS — F0391 Unspecified dementia with behavioral disturbance: Secondary | ICD-10-CM | POA: Diagnosis not present

## 2017-01-21 DIAGNOSIS — I2510 Atherosclerotic heart disease of native coronary artery without angina pectoris: Secondary | ICD-10-CM

## 2017-01-21 MED ORDER — DONEPEZIL HCL 10 MG PO TABS: 10.0000 mg | ORAL_TABLET | Freq: Two times a day (BID) | ORAL | 5 refills | Status: AC

## 2017-01-21 NOTE — Progress Notes (Addendum)
    HPI  Patient presents today here for follow up of depression  She is seen with her daughter in law Her memory loss seems to be accelerated recently. She has had some wandering which family has imaged well. She had difficulty sleeping and outbursts at night so she was started on Seroquel. This was previously working well but does not seem to be as helpful now. She's tolerating Aricept well.  She does have some daytime depression and periods of crying. Her family does not limit that her in a memory unit.  They state that her ADLs have been changing. She is no longer preparing food. She states that she is able to bathe herself, however her daughter-in-law states that she has been washing her hair and helping her in and out of the tub.  PMH: Smoking status noted ROS: Per HPI  Objective: BP 128/68   Pulse 64   Temp 97.7 F (36.5 C) (Oral)   Ht 5' 2.5" (1.588 m)   Wt 174 lb 9.6 oz (79.2 kg)   BMI 31.43 kg/m  Gen: NAD, alert, cooperative with exam HEENT: NCAT CV: RRR, good S1/S2, no murmur Resp: CTABL, no wheezes, non-labored Ext: No edema, warm Neuro: Alert and oriented, No gross deficits  Assessment and plan:  # Dementia with behavioral disturbance Continue Seroquel for nighttime agitation, could consider titrating, however she's on pretty good dose to start with. Consider transition to Haldol, however I'm hesitant to do this would appreciate neurology's recommendations. Titrate Aricept to 10 mg twice daily Discussed memory unit for difficult behavior to manage Patient has neurology appointment coming up, appreciate the recommendations   Meds ordered this encounter  Medications  . donepezil (ARICEPT) 10 MG tablet    Sig: Take 1 tablet (10 mg total) by mouth 2 (two) times daily.    Dispense:  60 tablet    Refill:  5    Murtis SinkSam Erinn Huskins, MD [facility] 01/21/2017, 4:55 PM

## 2017-01-21 NOTE — Patient Instructions (Signed)
Great to see you!  Come back in 2 months for follow up diabetes  Increase aricept to 10 mg pm and 1/2 in the morning for 1 week then 1 tab twice daily.

## 2017-01-30 ENCOUNTER — Other Ambulatory Visit: Payer: Self-pay | Admitting: Family Medicine

## 2017-02-13 ENCOUNTER — Encounter: Payer: Self-pay | Admitting: Neurology

## 2017-02-13 ENCOUNTER — Ambulatory Visit (INDEPENDENT_AMBULATORY_CARE_PROVIDER_SITE_OTHER): Payer: Medicare Other | Admitting: Neurology

## 2017-02-13 VITALS — BP 118/60 | HR 72 | Ht 62.5 in | Wt 176.0 lb

## 2017-02-13 DIAGNOSIS — I2510 Atherosclerotic heart disease of native coronary artery without angina pectoris: Secondary | ICD-10-CM | POA: Diagnosis not present

## 2017-02-13 DIAGNOSIS — F03B18 Unspecified dementia, moderate, with other behavioral disturbance: Secondary | ICD-10-CM

## 2017-02-13 DIAGNOSIS — F0391 Unspecified dementia with behavioral disturbance: Secondary | ICD-10-CM | POA: Diagnosis not present

## 2017-02-13 DIAGNOSIS — G309 Alzheimer's disease, unspecified: Secondary | ICD-10-CM

## 2017-02-13 MED ORDER — DIVALPROEX SODIUM ER 250 MG PO TB24: ORAL_TABLET | ORAL | 6 refills | Status: DC
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
## 2017-02-13 NOTE — Patient Instructions (Addendum)
1. Schedule MRI brain without contrast 2. Start Depakote ER : Take 1 capsule at night for 1 month, then increase to 2 caps at night 3. Continue Seroquel for now, then call after a month and we will start tapering it down 4. Continue to monitor home safety, 24/7 supervision 5. Follow-up in 4 months, call for any changes

## 2017-02-16 ENCOUNTER — Encounter: Payer: Self-pay | Admitting: Neurology

## 2017-02-20 ENCOUNTER — Other Ambulatory Visit: Payer: Self-pay | Admitting: Family Medicine

## 2017-03-24 ENCOUNTER — Encounter: Payer: Self-pay | Admitting: Family Medicine

## 2017-03-24 ENCOUNTER — Ambulatory Visit (INDEPENDENT_AMBULATORY_CARE_PROVIDER_SITE_OTHER): Payer: Medicare Other | Admitting: Family Medicine

## 2017-03-24 VITALS — BP 124/73 | HR 68 | Temp 97.7°F | Ht 62.5 in | Wt 169.8 lb

## 2017-03-24 DIAGNOSIS — I2510 Atherosclerotic heart disease of native coronary artery without angina pectoris: Secondary | ICD-10-CM

## 2017-03-24 DIAGNOSIS — E1121 Type 2 diabetes mellitus with diabetic nephropathy: Secondary | ICD-10-CM | POA: Diagnosis not present

## 2017-03-24 DIAGNOSIS — J449 Chronic obstructive pulmonary disease, unspecified: Secondary | ICD-10-CM

## 2017-03-24 DIAGNOSIS — E785 Hyperlipidemia, unspecified: Secondary | ICD-10-CM | POA: Diagnosis not present

## 2017-03-24 LAB — BAYER DCA HB A1C WAIVED: HB A1C: 6.8 % (ref <7.0)

## 2017-03-24 MED ORDER — NYSTATIN 100000 UNIT/GM EX CREA: 1.0000 "application " | TOPICAL_CREAM | Freq: Two times a day (BID) | CUTANEOUS | 0 refills | Status: DC

## 2017-03-24 NOTE — Progress Notes (Signed)
   HPI  Patient presents today for follow-up for diabetes, and other chronic medical conditions.  Patient has history of recurrent DVT- NO bleeding on Xarelto, she was previously having difficulty coming in for INRs  COPD More cough lately, has re-started smoking for about 1 month, not really motivated to quit.   T2DM Watching diet, no hypo Avg fasting 130-150, pp 150-200   PMH: Smoking status noted ROS: Per HPI  Objective: BP 124/73   Pulse 68   Temp 97.7 F (36.5 C) (Oral)   Ht 5' 2.5" (1.588 m)   Wt 169 lb 12.8 oz (77 kg)   BMI 30.56 kg/m  Gen: NAD, alert, cooperative with exam HEENT: NCAT CV: RRR, good S1/S2, no murmur Resp: CTABL, no wheezes, non-labored Ext: No edema, warm Neuro: Alert and oriented, No gross deficits  Diabetic Foot Exam - Simple   Simple Foot Form Diabetic Foot exam was performed with the following findings:  Yes 03/24/2017  3:28 PM  Visual Inspection No deformities, no ulcerations, no other skin breakdown bilaterally:  Yes See comments:  Yes Sensation Testing Intact to touch and monofilament testing bilaterally:  Yes Pulse Check Posterior Tibialis and Dorsalis pulse intact bilaterally:  Yes Comments Bilateral medial distal heel with symmetric callus, no clear signs of pre-ulceration        Assessment and plan:  # T2DM Control improving, no changes, consider de-escalating if control remains this good.   # HLD Labs, non fasting Continue zocor  # COPD  Worsening with smoking.  Discussed cessation, no exacerbation, just worsening with smoking.      Orders Placed This Encounter  Procedures  . Bayer DCA Hb A1c Waived  . LDL Cholesterol, Direct  . CBC with Differential/Platelet  . CMP14+EGFR    Meds ordered this encounter  Medications  . nystatin cream (MYCOSTATIN)    Sig: Apply 1 application topically 2 (two) times daily.    Dispense:  30 g    Refill:  0    Laroy Apple, MD [facility] 03/24/2017, 3:30 PM

## 2017-03-24 NOTE — Patient Instructions (Signed)
Great to see you!  Come back in 3 months unless you need [place] sooner.   Your guys are doing great with diabetes, keep up the hard work!

## 2017-03-25 LAB — CBC WITH DIFFERENTIAL/PLATELET
BASOS ABS: 0 10*3/uL (ref 0.0–0.2)
Basos: 1 %
EOS (ABSOLUTE): 0.1 10*3/uL (ref 0.0–0.4)
Eos: 1 %
Hematocrit: 41.1 % (ref 34.0–46.6)
Hemoglobin: 13.5 g/dL (ref 11.1–15.9)
IMMATURE GRANULOCYTES: 0 %
Immature Grans (Abs): 0 10*3/uL (ref 0.0–0.1)
Lymphocytes Absolute: 2.5 10*3/uL (ref 0.7–3.1)
Lymphs: 36 %
MCH: 29.6 pg (ref 26.6–33.0)
MCHC: 32.8 g/dL (ref 31.5–35.7)
MCV: 90 fL (ref 79–97)
MONOS ABS: 0.5 10*3/uL (ref 0.1–0.9)
Monocytes: 8 %
NEUTROS PCT: 54 %
Neutrophils Absolute: 3.9 10*3/uL (ref 1.4–7.0)
PLATELETS: 231 10*3/uL (ref 150–379)
RBC: 4.56 x10E6/uL (ref 3.77–5.28)
RDW: 15.9 % — AB (ref 12.3–15.4)
WBC: 7.1 10*3/uL (ref 3.4–10.8)

## 2017-03-25 LAB — CMP14+EGFR
A/G RATIO: 1.4 (ref 1.2–2.2)
ALBUMIN: 4 g/dL (ref 3.6–4.8)
ALK PHOS: 68 IU/L (ref 39–117)
ALT: 11 IU/L (ref 0–32)
AST: 12 IU/L (ref 0–40)
BILIRUBIN TOTAL: 0.4 mg/dL (ref 0.0–1.2)
BUN / CREAT RATIO: 12 (ref 12–28)
BUN: 16 mg/dL (ref 8–27)
CHLORIDE: 99 mmol/L (ref 96–106)
CO2: 24 mmol/L (ref 20–29)
Calcium: 9.5 mg/dL (ref 8.7–10.3)
Creatinine, Ser: 1.33 mg/dL — ABNORMAL HIGH (ref 0.57–1.00)
GFR calc non Af Amer: 41 mL/min/{1.73_m2} — ABNORMAL LOW (ref >59)
GFR, EST AFRICAN AMERICAN: 47 mL/min/{1.73_m2} — AB (ref >59)
GLUCOSE: 161 mg/dL — AB (ref 65–99)
Globulin, Total: 2.9 g/dL (ref 1.5–4.5)
POTASSIUM: 4.4 mmol/L (ref 3.5–5.2)
Sodium: 142 mmol/L (ref 134–144)
TOTAL PROTEIN: 6.9 g/dL (ref 6.0–8.5)

## 2017-03-25 LAB — LDL CHOLESTEROL, DIRECT: LDL Direct: 94 mg/dL (ref 0–99)

## 2017-03-27 ENCOUNTER — Ambulatory Visit
Admission: RE | Admit: 2017-03-27 | Discharge: 2017-03-27 | Disposition: A | Discharge status: Home / Self Care | Payer: Medicare Other | Source: Ambulatory Visit | Attending: Neurology | Admitting: Neurology

## 2017-03-27 DIAGNOSIS — F0391 Unspecified dementia with behavioral disturbance: Secondary | ICD-10-CM

## 2017-03-27 DIAGNOSIS — F03B18 Unspecified dementia, moderate, with other behavioral disturbance: Secondary | ICD-10-CM

## 2017-03-27 DIAGNOSIS — R413 Other amnesia: Secondary | ICD-10-CM | POA: Diagnosis not present

## 2017-03-31 ENCOUNTER — Telehealth: Payer: Self-pay

## 2017-03-31 NOTE — Telephone Encounter (Signed)
LMOM relaying message below.  

## 2017-03-31 NOTE — Telephone Encounter (Signed)
-----   Message from Van ClinesKaren M Aquino, MD sent at 03/28/2017  8:57 AM EDT ----- Pls let her know brain MRI did not show any evidence of tumor, stroke, or bleed. It showed mild changes seen in patients with hypertension, diabetes, and cholesterol issues.

## 2017-04-22 ENCOUNTER — Other Ambulatory Visit: Payer: Self-pay | Admitting: Family Medicine

## 2017-04-22 NOTE — Telephone Encounter (Signed)
03/24/17  Dr Ermalinda MemosBradshaw   If approved [facility]   [phone]

## 2017-05-19 ENCOUNTER — Other Ambulatory Visit: Payer: Self-pay | Admitting: Family Medicine

## 2017-05-19 DIAGNOSIS — N3281 Overactive bladder: Secondary | ICD-10-CM

## 2017-05-29 ENCOUNTER — Other Ambulatory Visit: Payer: Self-pay | Admitting: Family Medicine

## 2017-05-29 NOTE — Telephone Encounter (Signed)
Last seen 03/24/17  Dr Ermalinda MemosBradshaw  If approved route to nurse to call into [facility] [phone]

## 2017-06-24 ENCOUNTER — Ambulatory Visit (INDEPENDENT_AMBULATORY_CARE_PROVIDER_SITE_OTHER): Payer: Medicare Other | Admitting: Family Medicine

## 2017-06-24 ENCOUNTER — Encounter: Payer: Self-pay | Admitting: Family Medicine

## 2017-06-24 VITALS — BP 122/64 | HR 62 | Temp 97.1°F | Ht 62.5 in | Wt 166.4 lb

## 2017-06-24 DIAGNOSIS — E039 Hypothyroidism, unspecified: Secondary | ICD-10-CM

## 2017-06-24 DIAGNOSIS — I10 Essential (primary) hypertension: Secondary | ICD-10-CM | POA: Diagnosis not present

## 2017-06-24 DIAGNOSIS — E1122 Type 2 diabetes mellitus with diabetic chronic kidney disease: Secondary | ICD-10-CM | POA: Diagnosis not present

## 2017-06-24 DIAGNOSIS — N183 Chronic kidney disease, stage 3 unspecified: Secondary | ICD-10-CM

## 2017-06-24 DIAGNOSIS — E1121 Type 2 diabetes mellitus with diabetic nephropathy: Secondary | ICD-10-CM | POA: Diagnosis not present

## 2017-06-24 LAB — BAYER DCA HB A1C WAIVED: HB A1C (BAYER DCA - WAIVED): 6 % (ref <7.0)

## 2017-06-24 NOTE — Progress Notes (Signed)
   HPI  Patient presents today to follow-up for chronic medical conditions. Patient is seen alone, my medical assistant has touch base with her daughter and states that everything is going well.  She reports good medication compliance and no concerns.  Type 2 diabetes Patient reports good medication compliance, she states that she does not eat that much anymore.  Hypertension No chest pain or headaches.  She states her energy is pretty good. Her breathing is at baseline.  PMH: Smoking status noted ROS: Per HPI  Objective: BP 122/64   Pulse 62   Temp (!) 97.1 F (36.2 C) (Oral)   Ht 5' 2.5" (1.588 m)   Wt 166 lb 6.4 oz (75.5 kg)   BMI 29.95 kg/m  Gen: NAD, alert, cooperative with exam HEENT: NCAT CV: RRR, good S1/S2, no murmur Resp: CTABL, no wheezes, non-labored Ext: No edema, warm Neuro: Alert and oriented, No gross deficits  Diabetic Foot Exam - Simple   Simple Foot Form Diabetic Foot exam was performed with the following findings:  Yes 06/24/2017  3:13 PM  Visual Inspection No deformities, no ulcerations, no other skin breakdown bilaterally:  Yes Sensation Testing Intact to touch and monofilament testing bilaterally:  Yes Pulse Check Posterior Tibialis and Dorsalis pulse intact bilaterally:  Yes Comments      Assessment and plan:  #Type 2 diabetes Expect good control, A1c pending Continue current medications, would like to de-escalate treatment*  #Hypertension Well-controlled on losartan, Lasix, metoprolol, no changes  #Hypothyroidism Asymptomatic, repeat labs  #Chronic kidney disease stage III, with diabetes Repeat labs, asymptomatic    Orders Placed This Encounter  Procedures  . Bayer DCA Hb A1c Waived  . CMP14+EGFR  . CBC with Differential/Platelet  . TSH     Laroy Apple, MD [place] 06/24/2017, 3:14 PM

## 2017-06-24 NOTE — Patient Instructions (Signed)
Great to see you!  We will contact you within 1 week with your labwork.

## 2017-06-25 LAB — CMP14+EGFR
A/G RATIO: 1.4 (ref 1.2–2.2)
ALBUMIN: 3.9 g/dL (ref 3.6–4.8)
ALT: 10 IU/L (ref 0–32)
AST: 10 IU/L (ref 0–40)
Alkaline Phosphatase: 66 IU/L (ref 39–117)
BILIRUBIN TOTAL: 0.2 mg/dL (ref 0.0–1.2)
BUN / CREAT RATIO: 15 (ref 12–28)
BUN: 17 mg/dL (ref 8–27)
CALCIUM: 9.5 mg/dL (ref 8.7–10.3)
CHLORIDE: 100 mmol/L (ref 96–106)
CO2: 24 mmol/L (ref 20–29)
Creatinine, Ser: 1.16 mg/dL — ABNORMAL HIGH (ref 0.57–1.00)
GFR, EST AFRICAN AMERICAN: 56 mL/min/{1.73_m2} — AB (ref >59)
GFR, EST NON AFRICAN AMERICAN: 48 mL/min/{1.73_m2} — AB (ref >59)
GLUCOSE: 135 mg/dL — AB (ref 65–99)
Globulin, Total: 2.8 g/dL (ref 1.5–4.5)
Potassium: 4.4 mmol/L (ref 3.5–5.2)
Sodium: 142 mmol/L (ref 134–144)
TOTAL PROTEIN: 6.7 g/dL (ref 6.0–8.5)

## 2017-06-25 LAB — CBC WITH DIFFERENTIAL/PLATELET
BASOS ABS: 0.1 10*3/uL (ref 0.0–0.2)
BASOS: 1 %
EOS (ABSOLUTE): 0.1 10*3/uL (ref 0.0–0.4)
Eos: 1 %
HEMATOCRIT: 40.4 % (ref 34.0–46.6)
HEMOGLOBIN: 13.8 g/dL (ref 11.1–15.9)
IMMATURE GRANS (ABS): 0 10*3/uL (ref 0.0–0.1)
Immature Granulocytes: 0 %
LYMPHS ABS: 2.4 10*3/uL (ref 0.7–3.1)
Lymphs: 32 %
MCH: 30.7 pg (ref 26.6–33.0)
MCHC: 34.2 g/dL (ref 31.5–35.7)
MCV: 90 fL (ref 79–97)
MONOCYTES: 10 %
Monocytes Absolute: 0.7 10*3/uL (ref 0.1–0.9)
NEUTROS ABS: 4.2 10*3/uL (ref 1.4–7.0)
Neutrophils: 56 %
Platelets: 217 10*3/uL (ref 150–379)
RBC: 4.5 x10E6/uL (ref 3.77–5.28)
RDW: 14.9 % (ref 12.3–15.4)
WBC: 7.4 10*3/uL (ref 3.4–10.8)

## 2017-06-25 LAB — TSH: TSH: 0.672 u[IU]/mL (ref 0.450–4.500)

## 2017-06-27 ENCOUNTER — Other Ambulatory Visit: Payer: Self-pay | Admitting: Family Medicine

## 2017-07-03 ENCOUNTER — Ambulatory Visit: Payer: Medicare Other | Admitting: Neurology

## 2017-07-07 ENCOUNTER — Ambulatory Visit (INDEPENDENT_AMBULATORY_CARE_PROVIDER_SITE_OTHER): Payer: Medicare Other | Admitting: Neurology

## 2017-07-07 ENCOUNTER — Encounter: Payer: Self-pay | Admitting: Neurology

## 2017-07-07 VITALS — BP 100/60 | HR 64 | Ht 62.5 in | Wt 164.4 lb

## 2017-07-07 DIAGNOSIS — F03B18 Unspecified dementia, moderate, with other behavioral disturbance: Secondary | ICD-10-CM

## 2017-07-07 DIAGNOSIS — F0391 Unspecified dementia with behavioral disturbance: Secondary | ICD-10-CM

## 2017-07-07 MED ORDER — DIVALPROEX SODIUM ER 250 MG PO TB24
ORAL_TABLET | ORAL | 6 refills | Status: DC
Start: 2017-07-07 — End: 2018-02-26
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
## 2017-07-07 NOTE — Patient Instructions (Addendum)
1. Increase Depakote ER 250mg : Take 1 tab at dinner, 2 tabs at bedtime 2. Continue all your other medications 3. Follow-up in 6 months, call for any changes  FALL PRECAUTIONS: Be cautious when walking. Scan the area for obstacles that may increase the risk of trips and falls. When getting up in the mornings, sit up at the edge of the bed for a few minutes before getting out of bed. Consider elevating the bed at the head end to avoid drop of blood pressure when getting up. Walk always in a well-lit room (use night lights in the walls). Avoid area rugs or power cords from appliances in the middle of the walkways. Use a walker or a cane if necessary and consider physical therapy for balance exercise. Get your eyesight checked regularly.  FINANCIAL OVERSIGHT: Supervision, especially oversight when making financial decisions or transactions is also recommended.  HOME SAFETY: Consider the safety of the kitchen when operating appliances like stoves, microwave oven, and blender. Consider having supervision and share cooking responsibilities until no longer able to participate in those. Accidents with firearms and other hazards in the house should be identified and addressed as well.  DRIVING: Regarding driving, in patients with progressive memory problems, driving will be impaired. We advise to have someone else do the driving if trouble finding directions or if minor accidents are reported. Independent driving assessment is available to determine safety of driving.  ABILITY TO BE LEFT ALONE: If patient is unable to contact 911 operator, consider using LifeLine, or when the need is there, arrange for someone to stay with patients. Smoking is a fire hazard, consider supervision or cessation. Risk of wandering should be assessed by caregiver and if detected at any point, supervision and safe proof recommendations should be instituted.  MEDICATION SUPERVISION: Inability to self-administer medication needs to be  constantly addressed. Implement a mechanism to ensure safe administration of the medications.  RECOMMENDATIONS FOR ALL PATIENTS WITH MEMORY PROBLEMS: 1. Continue to exercise (Recommend 30 minutes of walking everyday, or 3 hours every week) 2. Increase social interactions - continue going to [place] and enjoy social gatherings with friends and family 3. Eat healthy, avoid fried foods and eat more fruits and vegetables 4. Maintain adequate blood pressure, blood sugar, and blood cholesterol level. Reducing the risk of stroke and cardiovascular disease also helps promoting better memory. 5. Avoid stressful situations. Live a simple life and avoid aggravations. Organize your time and prepare for the next day in anticipation. 6. Sleep well, avoid any interruptions of sleep and avoid any distractions in the bedroom that may interfere with adequate sleep quality 7. Avoid sugar, avoid sweets as there is a strong link between excessive sugar intake, diabetes, and cognitive impairment We discussed the Mediterranean diet, which has been shown to help patients reduce the risk of progressive memory disorders and reduces cardiovascular risk. This includes eating fish, eat fruits and green leafy vegetables, nuts like almonds and hazelnuts, walnuts, and also use olive oil. Avoid fast foods and fried foods as much as possible. Avoid sweets and sugar as sugar use has been linked to worsening of memory function.  There is always a concern of gradual progression of memory problems. If this is the case, then we may need to adjust level of care according to patient needs. Support, both to the patient and caregiver, should then be put into place.

## 2017-07-16 ENCOUNTER — Encounter: Payer: Self-pay | Admitting: Neurology

## 2017-07-20 ENCOUNTER — Other Ambulatory Visit: Payer: Self-pay | Admitting: Family Medicine

## 2017-08-15 ENCOUNTER — Encounter: Payer: Self-pay | Admitting: *Deleted

## 2017-08-17 ENCOUNTER — Other Ambulatory Visit: Payer: Self-pay | Admitting: Family Medicine

## 2017-09-16 ENCOUNTER — Other Ambulatory Visit: Payer: Self-pay | Admitting: Family Medicine

## 2017-09-16 DIAGNOSIS — I2510 Atherosclerotic heart disease of native coronary artery without angina pectoris: Secondary | ICD-10-CM

## 2017-09-16 DIAGNOSIS — N3281 Overactive bladder: Secondary | ICD-10-CM

## 2017-10-02 ENCOUNTER — Ambulatory Visit: Payer: Medicare Other | Admitting: Family Medicine

## 2017-10-07 ENCOUNTER — Ambulatory Visit (INDEPENDENT_AMBULATORY_CARE_PROVIDER_SITE_OTHER): Payer: Medicare Other | Admitting: Family Medicine

## 2017-10-07 ENCOUNTER — Encounter: Payer: Self-pay | Admitting: Family Medicine

## 2017-10-07 VITALS — BP 128/68 | HR 60 | Temp 97.4°F | Ht 62.5 in | Wt 170.4 lb

## 2017-10-07 DIAGNOSIS — Z7901 Long term (current) use of anticoagulants: Secondary | ICD-10-CM

## 2017-10-07 DIAGNOSIS — E1121 Type 2 diabetes mellitus with diabetic nephropathy: Secondary | ICD-10-CM | POA: Diagnosis not present

## 2017-10-07 DIAGNOSIS — F0391 Unspecified dementia with behavioral disturbance: Secondary | ICD-10-CM | POA: Diagnosis not present

## 2017-10-07 DIAGNOSIS — I2510 Atherosclerotic heart disease of native coronary artery without angina pectoris: Secondary | ICD-10-CM

## 2017-10-07 LAB — CBC WITH DIFFERENTIAL/PLATELET
BASOS: 1 %
Basophils Absolute: 0 10*3/uL (ref 0.0–0.2)
EOS (ABSOLUTE): 0.1 10*3/uL (ref 0.0–0.4)
EOS: 1 %
HEMATOCRIT: 40.1 % (ref 34.0–46.6)
HEMOGLOBIN: 13.3 g/dL (ref 11.1–15.9)
IMMATURE GRANS (ABS): 0 10*3/uL (ref 0.0–0.1)
Immature Granulocytes: 1 %
LYMPHS: 43 %
Lymphocytes Absolute: 2 10*3/uL (ref 0.7–3.1)
MCH: 31 pg (ref 26.6–33.0)
MCHC: 33.2 g/dL (ref 31.5–35.7)
MCV: 94 fL (ref 79–97)
MONOCYTES: 9 %
Monocytes Absolute: 0.4 10*3/uL (ref 0.1–0.9)
NEUTROS ABS: 2.1 10*3/uL (ref 1.4–7.0)
Neutrophils: 45 %
Platelets: 190 10*3/uL (ref 150–379)
RBC: 4.29 x10E6/uL (ref 3.77–5.28)
RDW: 15.3 % (ref 12.3–15.4)
WBC: 4.6 10*3/uL (ref 3.4–10.8)

## 2017-10-07 LAB — LIPID PANEL
Chol/HDL Ratio: 2.6 ratio (ref 0.0–4.4)
Cholesterol, Total: 153 mg/dL (ref 100–199)
HDL: 60 mg/dL (ref >39)
LDL CALC: 75 mg/dL (ref 0–99)
Triglycerides: 92 mg/dL (ref 0–149)
VLDL Cholesterol Cal: 18 mg/dL (ref 5–40)

## 2017-10-07 LAB — CMP14+EGFR
ALT: 15 IU/L (ref 0–32)
AST: 12 IU/L (ref 0–40)
Albumin/Globulin Ratio: 1.6 (ref 1.2–2.2)
Albumin: 3.9 g/dL (ref 3.6–4.8)
Alkaline Phosphatase: 54 IU/L (ref 39–117)
BILIRUBIN TOTAL: 0.2 mg/dL (ref 0.0–1.2)
BUN/Creatinine Ratio: 15 (ref 12–28)
BUN: 17 mg/dL (ref 8–27)
CALCIUM: 9.4 mg/dL (ref 8.7–10.3)
CHLORIDE: 102 mmol/L (ref 96–106)
CO2: 26 mmol/L (ref 20–29)
Creatinine, Ser: 1.13 mg/dL — ABNORMAL HIGH (ref 0.57–1.00)
GFR, EST AFRICAN AMERICAN: 57 mL/min/{1.73_m2} — AB (ref >59)
GFR, EST NON AFRICAN AMERICAN: 50 mL/min/{1.73_m2} — AB (ref >59)
GLUCOSE: 127 mg/dL — AB (ref 65–99)
Globulin, Total: 2.5 g/dL (ref 1.5–4.5)
POTASSIUM: 4.6 mmol/L (ref 3.5–5.2)
Sodium: 143 mmol/L (ref 134–144)
TOTAL PROTEIN: 6.4 g/dL (ref 6.0–8.5)

## 2017-10-07 LAB — BAYER DCA HB A1C WAIVED: HB A1C (BAYER DCA - WAIVED): 6.1 % (ref <7.0)

## 2017-10-07 NOTE — Progress Notes (Signed)
   HPI  Patient presents today for follow-up chronic medical conditions.  Dementia Seems to be getting worse, however has stopped Aricept.  Her daughter states that the pharmacy just has not given it to him. She has previously tolerated Aricept well.  Back and leg pain, diabetic neuropathy Patient was previously doing okay with this, she has stopped gabapentin again, because the pharmacy has not given to her.  The daughter states she has had a recent worsening in pain to the point of tears  Diabetes Good medication compliance, did not stop glimepiride. No hypoglycemia  PMH: Smoking status noted ROS: Per HPI  Objective: BP 128/68   Pulse 60   Temp (!) 97.4 F (36.3 C) (Oral)   Ht 5' 2.5" (1.588 m)   Wt 170 lb 6.4 oz (77.3 kg)   BMI 30.67 kg/m  Gen: NAD, alert, cooperative with exam HEENT: NCAT CV: RRR, good S1/S2, no murmur Resp: CTABL, no wheezes, non-labored Ext: No edema, warm Neuro: Alert and oriented, No gross deficits  Assessment and plan:  # Type 2 diabetes A1c 6.1, discontinue glimepiride, continue metformin Labs today  #Dementia Appears to be worsening slightly, however she has stopped Aricept Restart Aricept Follow-up with neurology as recommended  #Anticoagulation chronically, recurrent DVT/PE Tolerating Xarelto well, no bleeding Labs today    Orders Placed This Encounter  Procedures  . Microalbumin / creatinine urine ratio  . Bayer DCA Hb A1c Waived  . Lipid panel  . CMP14+EGFR  . CBC with [place], MD [facility] 10/07/2017, 11:43 AM

## 2017-10-07 NOTE — Patient Instructions (Signed)
Great to see you!  Stop glimepride, Start aricept and gabapentin again.   Come back to see a different provider in 3 months

## 2017-10-09 ENCOUNTER — Other Ambulatory Visit: Payer: Self-pay | Admitting: Family Medicine

## 2017-10-10 ENCOUNTER — Telehealth: Payer: Self-pay | Admitting: *Deleted

## 2017-10-10 MED ORDER — NYSTATIN 100000 UNIT/GM EX CREA: 1.0000 "application " | TOPICAL_CREAM | Freq: Two times a day (BID) | CUTANEOUS | 0 refills | Status: DC

## 2017-10-10 MED ORDER — GABAPENTIN 300 MG PO CAPS: 300.0000 mg | ORAL_CAPSULE | Freq: Two times a day (BID) | ORAL | 2 refills | Status: DC

## 2017-10-10 NOTE — Telephone Encounter (Signed)
Refill sent to pharmacy.   

## 2017-10-13 ENCOUNTER — Telehealth: Payer: Self-pay | Admitting: Family Medicine

## 2017-10-13 NOTE — Telephone Encounter (Signed)
Aware of results. 

## 2017-10-16 ENCOUNTER — Other Ambulatory Visit: Payer: Self-pay | Admitting: Family Medicine

## 2017-10-28 ENCOUNTER — Other Ambulatory Visit: Payer: Self-pay | Admitting: Family Medicine

## 2017-11-18 ENCOUNTER — Ambulatory Visit (INDEPENDENT_AMBULATORY_CARE_PROVIDER_SITE_OTHER): Payer: Medicare Other | Admitting: Family Medicine

## 2017-11-18 ENCOUNTER — Encounter: Payer: Self-pay | Admitting: Family Medicine

## 2017-11-18 VITALS — BP 115/64 | HR 59 | Temp 97.7°F | Ht 62.5 in | Wt 170.0 lb

## 2017-11-18 DIAGNOSIS — F0391 Unspecified dementia with behavioral disturbance: Secondary | ICD-10-CM

## 2017-11-18 DIAGNOSIS — I2510 Atherosclerotic heart disease of native coronary artery without angina pectoris: Secondary | ICD-10-CM | POA: Diagnosis not present

## 2017-11-18 NOTE — Patient Instructions (Signed)
Great to see you!  Be sur eto call for an appointment with Dr. Karel JarvisAquino  Come back to see Dr. Nadine CountsGottschalk in 2 months

## 2017-11-18 NOTE — Progress Notes (Signed)
   HPI  Patient presents today here for follow-up dementia.  Patient is brought in with her family today.  They state that she seems to be getting worse.  She has confusion at night, her daughter is a CNA that brings her and states that she has been around sundowning quite a bit.  Patient is taking Aricept now.  Seems to be tolerating it well.  She has not had any drastic changes lately, however does seem to be steadily worsening.  No fever, chills, sweats.  She denies headache. She has had a recent fall about 7 to 10 days ago.  They state that she did bump her head, however patient denies any headache.  She has not had any noticeable change since that time.  She is unable to dress herself, prepare food herself She does move around the house without a problem.  She lives with her husband and her daughter checks on her frequently.  PMH: Smoking status noted ROS: Per HPI  Objective: BP 115/64   Pulse (!) 59   Temp 97.7 F (36.5 C) (Oral)   Ht 5' 2.5" (1.588 m)   Wt 170 lb (77.1 kg)   BMI 30.60 kg/m  Gen: NAD, alert, cooperative with exam HEENT: NCAT CV: RRR, good S1/S2, no murmur Resp: CTABL, no wheezes, non-labored Ext: No edema, warm Neuro: Alert and oriented, No gross deficits  Assessment and plan:  #Dementia Patient appears to be getting worse, recommended follow-up with neurology Patient is taking Aricept now 10 mg. Labs are up-to-date She has had a recent fall, she was treated with blood thinners, she does not have any symptoms of intracranial bleed, however we reviewed red flags and reasons to seek emergent medical care including any, even if mild, head trauma.    Murtis SinkSam Bradshaw, MD [facility] 11/18/2017, 11:40 AM

## 2017-12-04 ENCOUNTER — Telehealth: Payer: Self-pay | Admitting: Family Medicine

## 2017-12-04 NOTE — Telephone Encounter (Signed)
Please review and advise.

## 2017-12-05 MED ORDER — KETOCONAZOLE 2 % EX CREA: 1.0000 "application " | TOPICAL_CREAM | Freq: Every day | CUTANEOUS | 1 refills | Status: DC

## 2017-12-05 MED ORDER — NYSTATIN 100000 UNIT/GM EX POWD: Freq: Four times a day (QID) | CUTANEOUS | 2 refills | Status: DC

## 2017-12-05 NOTE — Telephone Encounter (Signed)
I have sent in an alternative antifungal cream and the nystatin powder.   Murtis SinkSam Bradshaw, MD [facility] 12/05/2017, 7:42 AM

## 2017-12-05 NOTE — Telephone Encounter (Signed)
Pt aware.

## 2017-12-10 ENCOUNTER — Other Ambulatory Visit: Payer: Self-pay | Admitting: Family Medicine

## 2017-12-12 NOTE — Telephone Encounter (Signed)
Last seen 11/18/17

## 2017-12-25 ENCOUNTER — Telehealth: Payer: Self-pay | Admitting: Family Medicine

## 2017-12-26 ENCOUNTER — Other Ambulatory Visit: Payer: Self-pay | Admitting: *Deleted

## 2017-12-26 MED ORDER — GLUCOSE BLOOD VI STRP: ORAL_STRIP | 3 refills | Status: AC

## 2018-01-07 ENCOUNTER — Ambulatory Visit: Payer: Medicare Other | Admitting: Neurology

## 2018-01-12 ENCOUNTER — Other Ambulatory Visit: Payer: Self-pay | Admitting: Family Medicine

## 2018-01-15 ENCOUNTER — Other Ambulatory Visit: Payer: Self-pay | Admitting: Family Medicine

## 2018-01-15 DIAGNOSIS — N3281 Overactive bladder: Secondary | ICD-10-CM

## 2018-01-19 ENCOUNTER — Ambulatory Visit (INDEPENDENT_AMBULATORY_CARE_PROVIDER_SITE_OTHER): Payer: Medicare HMO | Admitting: Family Medicine

## 2018-01-19 ENCOUNTER — Encounter: Payer: Self-pay | Admitting: Family Medicine

## 2018-01-19 VITALS — BP 117/62 | HR 73 | Temp 97.9°F | Ht 62.0 in | Wt 184.0 lb

## 2018-01-19 DIAGNOSIS — I10 Essential (primary) hypertension: Secondary | ICD-10-CM | POA: Diagnosis not present

## 2018-01-19 DIAGNOSIS — F0391 Unspecified dementia with behavioral disturbance: Secondary | ICD-10-CM | POA: Diagnosis not present

## 2018-01-19 DIAGNOSIS — F03B18 Unspecified dementia, moderate, with other behavioral disturbance: Secondary | ICD-10-CM

## 2018-01-19 NOTE — Progress Notes (Signed)
Subjective: CC: dementia follow up PCP: Raliegh Ip, DO UEA:VWUJWJ NYEMAH WATTON is a 72 y.o. female presenting to clinic today for:  1. Dementia Patient is brought to the office by her daughter who notes that she is her primary caregiver.  She provides 24/7 care to her, citing that she recently moved in.  Patient is dependent upon her daughter for feeding, bathing, transportation.  Her husband, who is is mentally capable, has physical restrictions.  Her daughter reports that the patient's mood has been fairly stable.  She notes that she is not falling out of bed and that they have placed new locks in the door so that she does not leave the home unattended.  She states that she never followed up with Dr. Karel Jarvis because she was unaware of the appointment with Dr. Karel Jarvis January 07, 2018.  She reports compliance with medications and notes that she manages all of them for her mother.     ROS: Per HPI  Allergies  Allergen Reactions  . Procaine Hcl Anaphylaxis  . Codeine     "knocks me out" - states it is too strong  . Latex Hives and Rash  . Penicillins Diarrhea    Has patient had a PCN reaction causing immediate rash, facial/tongue/throat swelling, SOB or lightheadedness with hypotension: No Has patient had a PCN reaction causing severe rash involving mucus membranes or skin necrosis: No Has patient had a PCN reaction that required hospitalization No Has patient had a PCN reaction occurring within the last 10 years: No If all of the above answers are "NO", then may proceed with Cephalosporin use.    Past Medical History:  Diagnosis Date  . Anemia   . Anxiety   . Anxiety and depression, ( ? early dementia) 08/14/2011  . Arthritis   . Asthma   . Blood transfusion 2007   History of  . Chronic kidney disease    lt kidney limited fxn  . Chronic pain   . COPD (chronic obstructive pulmonary disease) with emphysema (HCC)   . DDD (degenerative disc disease)   . Dementia   .  Depression   . Diabetes mellitus   . Gastroesophageal reflux disease   . Headache(784.0)   . Heart murmur    No significant valvular lesions on echo  . Heart palpitations   . History of CHF (congestive heart failure) 2007   2013: Normal EF by echo x2 with no significant diastolic dysfunction noted either  . Hyperlipidemia   . Hypertension   . Hypothyroidism (acquired)    On Levothyroxine  . Myocardial infarction (HCC)   . Osteoporosis   . PE (pulmonary embolism) February 2007; March 2013   Bilateral PEs in 07; massive PE March 2013 with moderate elevation in PA pressures.  . Seizures (HCC)    once  . Uterine prolapse     Current Outpatient Medications:  .  ACCU-CHEK SOFTCLIX LANCETS lancets, USE TO CHECK BLOOD SUGAR UP TO TWICE DAILY . DX: E11.9 - TYPE 2 DIABETES MELLITUS, Disp: 100 each, Rfl: 2 .  albuterol (PROAIR HFA) 108 (90 Base) MCG/ACT inhaler, Inhale 2 puffs into the lungs every 6 (six) hours as needed for wheezing or shortness of breath., Disp: 8.5 g, Rfl: 1 .  Cholecalciferol (VITAMIN D3) 5000 UNITS CAPS, Take 5,000 Units by mouth daily., Disp: , Rfl:  .  divalproex (DEPAKOTE ER) 250 MG 24 hr tablet, Take 1 tab at dinner, 2 tabs at bedtime, Disp: 90 tablet, Rfl: 6 .  donepezil (ARICEPT) 10 MG tablet, Take 1 tablet (10 mg total) by mouth 2 (two) times daily., Disp: 60 tablet, Rfl: 5 .  DULoxetine (CYMBALTA) 30 MG capsule, TAKE ONE CAPSULE BY MOUTH EVERY DAY, Disp: 90 capsule, Rfl: 0 .  furosemide (LASIX) 40 MG tablet, TAKE 1/2 TABLET BY MOUTH EVERY DAY, Disp: 15 tablet, Rfl: 4 .  gabapentin (NEURONTIN) 300 MG capsule, TAKE ONE CAPSULE BY MOUTH TWICE DAILY, Disp: 60 capsule, Rfl: 1 .  glimepiride (AMARYL) 2 MG tablet, TAKE 1 TABLET BY MOUTH EVERY DAY BEFORE BREAKFAST, Disp: 90 tablet, Rfl: 0 .  glucose blood (TRUE METRIX BLOOD GLUCOSE TEST) test strip, USE TO CHECK BLOOD GLUCOSE UP TO TWICE DAILY, Disp: 200 each, Rfl: 3 .  ketoconazole (NIZORAL) 2 % cream, Apply 1 application  topically daily., Disp: 30 g, Rfl: 1 .  levothyroxine (SYNTHROID, LEVOTHROID) 88 MCG tablet, TAKE ONE TABLET BY MOUTH EVERY MORNING BEFORE BREAKFAST, Disp: 30 tablet, Rfl: 10 .  losartan (COZAAR) 25 MG tablet, TAKE 1 TABLET BY MOUTH EVERY DAY, Disp: 30 tablet, Rfl: 4 .  meclizine (ANTIVERT) 50 MG tablet, Take 50 mg by mouth daily. Reported on 09/27/2015, Disp: , Rfl:  .  metFORMIN (GLUCOPHAGE) 500 MG tablet, TAKE 1 TABLET BY MOUTH TWICE DAILY WITH A MEAL, Disp: 180 tablet, Rfl: 0 .  metoprolol succinate (TOPROL-XL) 25 MG 24 hr tablet, TAKE 1/2 TABLET BY MOUTH EVERY DAY, Disp: 30 tablet, Rfl: 2 .  nystatin (NYSTATIN) powder, Apply topically 4 (four) times daily., Disp: 60 g, Rfl: 2 .  nystatin cream (MYCOSTATIN), Apply 1 application topically 2 (two) times daily., Disp: 30 g, Rfl: 0 .  Omega-3 Fatty Acids (FISH OIL) 1000 MG CAPS, Take 1 capsule by mouth every morning., Disp: , Rfl:  .  oxybutynin (DITROPAN) 5 MG tablet, TAKE 1 TABLET BY MOUTH THREE TIMES DAILY, Disp: 90 tablet, Rfl: 0 .  pantoprazole (PROTONIX) 20 MG tablet, TAKE 1 TABLET BY MOUTH TWICE DAILY, Disp: 60 tablet, Rfl: 4 .  potassium chloride SA (K-DUR,KLOR-CON) 20 MEQ tablet, TAKE 1 TABLET BY MOUTH EVERY MORNING, Disp: 30 tablet, Rfl: 4 .  QUEtiapine (SEROQUEL) 200 MG tablet, TAKE 1 TABLET BY MOUTH AT BEDTIME, Disp: 30 tablet, Rfl: 4 .  simvastatin (ZOCOR) 40 MG tablet, TAKE 1 TABLET BY MOUTH AT BEDTIME, Disp: 30 tablet, Rfl: 2 .  XARELTO 10 MG TABS tablet, TAKE 1 TABLET BY MOUTH DAILY, Disp: 30 tablet, Rfl: 2 Social History   Socioeconomic History  . Marital status: Married    Spouse name: Not on file  . Number of children: Not on file  . Years of education: Not on file  . Highest education level: Not on file  Occupational History  . Not on file  Social Needs  . Financial resource strain: Not on file  . Food insecurity:    Worry: Not on file    Inability: Not on file  . Transportation needs:    Medical: Not on file     Non-medical: Not on file  Tobacco Use  . Smoking status: Current Every Day Smoker    Packs/day: 2.00    Years: 40.00    Pack years: 80.00    Types: Cigarettes    Last attempt to quit: 03/23/2001    Years since quitting: 16.8  . Smokeless tobacco: Never Used  . Tobacco comment: EXPOSED TO PASSIVE SMOKE  Substance and Sexual Activity  . Alcohol use: No  . Drug use: No  . Sexual activity: Never  Birth control/protection: Surgical  Lifestyle  . Physical activity:    Days per week: Not on file    Minutes per session: Not on file  . Stress: Not on file  Relationships  . Social connections:    Talks on phone: Not on file    Gets together: Not on file    Attends religious service: Not on file    Active member of club or organization: Not on file    Attends meetings of clubs or organizations: Not on file    Relationship status: Not on file  . Intimate partner violence:    Fear of current or ex partner: Not on file    Emotionally abused: Not on file    Physically abused: Not on file    Forced sexual activity: Not on file  Other Topics Concern  . Not on file  Social History Narrative   She is a married mother of 6, she has raised one 70 year old grandson. She was a former smoker of at least 50-pack-years but quit in 2006. She recently purchased an exercise bicycle, but is not been actively using it. She is trying to become more active. She denies any active alcohol use.   Her husband was recently diagnosed with urologic cancer, so she is somewhat "frazzled in her overall demeanor.)   Family History  Problem Relation Age of Onset  . Heart disease Mother   . Stroke Mother   . Dementia Mother   . Prostate cancer Father   . Cancer Father        bone  . COPD Sister   . Heart disease Sister   . Goiter Sister   . Cancer Sister        THYROID  . Early death Brother 8  . Cancer Brother        leukemia  . Liver cancer Brother   . Alzheimer's disease Unknown        family  history  . Cancer Daughter        breast  . Cancer Paternal Uncle        unsure of what kind    Objective: Office vital signs reviewed. BP 117/62   Pulse 73   Temp 97.9 F (36.6 C) (Oral)   Ht 5\' 2"  (1.575 m)   Wt 184 lb (83.5 kg)   BMI 33.65 kg/m   Physical Examination:  General: Awake, alert, disheveled. No acute distress HEENT: sclera white, [name] Cardio: regular rate and rhythm, S1S2 heard, no murmurs appreciated Pulm: clear to auscultation bilaterally, no wheezes, rhonchi or rales; normal work of breathing on room air Neuro: Follows commands.  Oriented to self.  Often looks to her granddaughter and daughter for answers during questioning.  Assessment/ Plan: 72 y.o. female   1. Moderate dementia with behavioral disturbance No changes to medications.  At this time, no plans for placement.  She has 24/7 care, provided by her daughter.  No medical equipment needed at this time.  I recommended that she continue to follow with Dr. Karel JarvisAquino as scheduled.  Dr. Rosalyn GessAquino's information was provided to the patient's daughter today.  2. Essential hypertension, benign Controlled on current regimen.  No changes.   Raliegh IpAshly M Andersen Iorio, DO [facility] [phone]

## 2018-01-19 NOTE — Patient Instructions (Addendum)
Schedule a follow up with Dr Karel JarvisAquino: [address], [place], [place] [address] Phone: [phone]

## 2018-02-11 ENCOUNTER — Other Ambulatory Visit: Payer: Self-pay | Admitting: Family

## 2018-02-11 DIAGNOSIS — N3281 Overactive bladder: Secondary | ICD-10-CM

## 2018-02-17 ENCOUNTER — Encounter (INDEPENDENT_AMBULATORY_CARE_PROVIDER_SITE_OTHER): Payer: Medicare HMO | Admitting: *Deleted

## 2018-02-17 ENCOUNTER — Telehealth: Payer: Self-pay | Admitting: Family Medicine

## 2018-02-17 ENCOUNTER — Encounter: Payer: Self-pay | Admitting: Family Medicine

## 2018-02-17 ENCOUNTER — Ambulatory Visit: Payer: Medicare HMO | Admitting: Family Medicine

## 2018-02-17 ENCOUNTER — Ambulatory Visit (INDEPENDENT_AMBULATORY_CARE_PROVIDER_SITE_OTHER): Payer: Medicare HMO | Admitting: Family Medicine

## 2018-02-17 VITALS — BP 138/77 | HR 74 | Temp 98.2°F | Ht 62.0 in | Wt 189.0 lb

## 2018-02-17 DIAGNOSIS — M545 Low back pain: Secondary | ICD-10-CM | POA: Diagnosis not present

## 2018-02-17 DIAGNOSIS — N10 Acute tubulo-interstitial nephritis: Secondary | ICD-10-CM | POA: Diagnosis not present

## 2018-02-17 DIAGNOSIS — R410 Disorientation, unspecified: Secondary | ICD-10-CM

## 2018-02-17 LAB — URINALYSIS, COMPLETE
BILIRUBIN UA: NEGATIVE
Glucose, UA: NEGATIVE
KETONES UA: NEGATIVE
Nitrite, UA: NEGATIVE
Protein, UA: NEGATIVE
SPEC GRAV UA: 1.015 (ref 1.005–1.030)
UUROB: 0.2 mg/dL (ref 0.2–1.0)
pH, UA: 7.5 (ref 5.0–7.5)

## 2018-02-17 LAB — MICROSCOPIC EXAMINATION

## 2018-02-17 MED ORDER — CEPHALEXIN 250 MG PO CAPS: 250.0000 mg | ORAL_CAPSULE | Freq: Four times a day (QID) | ORAL | 0 refills | Status: DC

## 2018-02-17 MED ORDER — CEFTRIAXONE SODIUM 1 G IJ SOLR
1.0000 g | Freq: Once | INTRAMUSCULAR | Status: AC
  Administered 2018-02-17: 1 g via INTRAMUSCULAR

## 2018-02-17 NOTE — Patient Instructions (Signed)
More that she has a kidney infection.  I have given her dose of Rocephin intramuscularly today.  You may start the oral cephalexin around 10 PM.  Start every 6 hours dosing tomorrow.  We discussed that if symptoms worsen or she is not getting better within the next couple of days I want her to seek immediate medical attention the emergency department.  I will contact you the results of her labs and urine culture once available.  I would like to see her back in the next 2 to 3 days for recheck of mental status and progress.   Pyelonephritis, Adult Pyelonephritis is a kidney infection. The kidneys are organs that help clean your blood by moving waste out of your blood and into your pee (urine). This infection can happen quickly, or it can last for a long time. In most cases, it clears up with treatment and does not cause other problems. Follow these instructions at home: Medicines  Take over-the-counter and prescription medicines only as told by your doctor.  Take your antibiotic medicine as told by your doctor. Do not stop taking the medicine even if you start to feel better. General instructions  Drink enough fluid to keep your pee clear or pale yellow.  Avoid caffeine, tea, and carbonated drinks.  Pee (urinate) often. Avoid holding in pee for long periods of time.  Pee before and after sex.  After pooping (having a bowel movement), women should wipe from front to back. Use each tissue only once.  Keep all follow-up visits as told by your doctor. This is important. Contact a doctor if:  You do not feel better after 2 days.  Your symptoms get worse.  You have a fever. Get help right away if:  You cannot take your medicine or drink fluids as told.  You have chills and shaking.  You throw up (vomit).  You have very bad pain in your side (flank) or back.  You feel very weak or you pass out (faint). This information is not intended to replace advice given to you by your health  care provider. Make sure you discuss any questions you have with your health care provider. Document Released: 07/04/2004 Document Revised: 11/02/2015 Document Reviewed: 09/19/2014 Elsevier Interactive Patient Education  [employer].

## 2018-02-17 NOTE — Progress Notes (Signed)
Subjective: CC: Delirium, flank pain PCP: Raliegh Ip, DO [name] is a 72 y.o. female presenting to clinic today for:  1.  Flank pain Patient is brought to the office by her daughter who notes that she has been complaining of low back pain.  She denies specific laterality.  She notes that she has had associated increased urinary frequency and measured fevers to 100.3 F.  She states that she is been somewhat delirious, seeing people that are not there and not following commands appropriately.  Denies any injury, falls.  No visualized hematuria.  No history of renal stones.  Patient has history of allergy to penicillin, which resulted in diarrhea.  No history of anaphylaxis.  Her daughter thinks she is tolerated cephalosporins without difficulty in the past.  Past medical history significant for dementia with behavioral disturbance.  Her daughter provides 24/7 care for her.   ROS: Per HPI  Allergies  Allergen Reactions  . Procaine Hcl Anaphylaxis  . Codeine     "knocks me out" - states it is too strong  . Latex Hives and Rash  . Penicillins Diarrhea    Has patient had a PCN reaction causing immediate rash, facial/tongue/throat swelling, SOB or lightheadedness with hypotension: No Has patient had a PCN reaction causing severe rash involving mucus membranes or skin necrosis: No Has patient had a PCN reaction that required hospitalization No Has patient had a PCN reaction occurring within the last 10 years: No If all of the above answers are "NO", then may proceed with Cephalosporin use.    Past Medical History:  Diagnosis Date  . Anemia   . Anxiety   . Anxiety and depression, ( ? early dementia) 08/14/2011  . Arthritis   . Asthma   . Blood transfusion 2007   History of  . Chronic kidney disease    lt kidney limited fxn  . Chronic pain   . COPD (chronic obstructive pulmonary disease) with emphysema (HCC)   . DDD (degenerative disc disease)   . Dementia   .  Depression   . Diabetes mellitus   . Gastroesophageal reflux disease   . Headache(784.0)   . Heart murmur    No significant valvular lesions on echo  . Heart palpitations   . History of CHF (congestive heart failure) 2007   2013: Normal EF by echo x2 with no significant diastolic dysfunction noted either  . Hyperlipidemia   . Hypertension   . Hypothyroidism (acquired)    On Levothyroxine  . Myocardial infarction (HCC)   . Osteoporosis   . PE (pulmonary embolism) February 2007; March 2013   Bilateral PEs in 07; massive PE March 2013 with moderate elevation in PA pressures.  . Seizures (HCC)    once  . Uterine prolapse     Current Outpatient Medications:  .  ACCU-CHEK SOFTCLIX LANCETS lancets, USE TO CHECK BLOOD SUGAR UP TO TWICE DAILY . DX: E11.9 - TYPE 2 DIABETES MELLITUS, Disp: 100 each, Rfl: 2 .  Cholecalciferol (VITAMIN D3) 5000 UNITS CAPS, Take 5,000 Units by mouth daily., Disp: , Rfl:  .  divalproex (DEPAKOTE ER) 250 MG 24 hr tablet, Take 1 tab at dinner, 2 tabs at bedtime, Disp: 90 tablet, Rfl: 6 .  donepezil (ARICEPT) 10 MG tablet, Take 1 tablet (10 mg total) by mouth 2 (two) times daily., Disp: 60 tablet, Rfl: 5 .  DULoxetine (CYMBALTA) 30 MG capsule, TAKE ONE CAPSULE BY MOUTH EVERY DAY, Disp: 90 capsule, Rfl: 0 .  furosemide (LASIX) 40 MG tablet, TAKE 1/2 TABLET BY MOUTH EVERY DAY, Disp: 15 tablet, Rfl: 4 .  gabapentin (NEURONTIN) 300 MG capsule, TAKE ONE CAPSULE BY MOUTH TWICE DAILY, Disp: 60 capsule, Rfl: 1 .  glimepiride (AMARYL) 2 MG tablet, TAKE 1 TABLET BY MOUTH EVERY DAY BEFORE BREAKFAST, Disp: 90 tablet, Rfl: 0 .  glucose blood (TRUE METRIX BLOOD GLUCOSE TEST) test strip, USE TO CHECK BLOOD GLUCOSE UP TO TWICE DAILY, Disp: 200 each, Rfl: 3 .  levothyroxine (SYNTHROID, LEVOTHROID) 88 MCG tablet, TAKE ONE TABLET BY MOUTH EVERY MORNING BEFORE BREAKFAST, Disp: 30 tablet, Rfl: 10 .  losartan (COZAAR) 25 MG tablet, TAKE 1 TABLET BY MOUTH EVERY DAY, Disp: 30 tablet, Rfl:  4 .  meclizine (ANTIVERT) 50 MG tablet, Take 50 mg by mouth daily. Reported on 09/27/2015, Disp: , Rfl:  .  metFORMIN (GLUCOPHAGE) 500 MG tablet, TAKE 1 TABLET BY MOUTH TWICE DAILY WITH A MEAL, Disp: 180 tablet, Rfl: 0 .  metoprolol succinate (TOPROL-XL) 25 MG 24 hr tablet, TAKE 1/2 TABLET BY MOUTH EVERY DAY, Disp: 30 tablet, Rfl: 2 .  nystatin (NYSTATIN) powder, Apply topically 4 (four) times daily., Disp: 60 g, Rfl: 2 .  nystatin cream (MYCOSTATIN), Apply 1 application topically 2 (two) times daily., Disp: 30 g, Rfl: 0 .  Omega-3 Fatty Acids (FISH OIL) 1000 MG CAPS, Take 1 capsule by mouth every morning., Disp: , Rfl:  .  oxybutynin (DITROPAN) 5 MG tablet, TAKE 1 TABLET BY MOUTH THREE TIMES DAILY, Disp: 90 tablet, Rfl: 0 .  pantoprazole (PROTONIX) 20 MG tablet, TAKE 1 TABLET BY MOUTH TWICE DAILY, Disp: 60 tablet, Rfl: 4 .  potassium chloride SA (K-DUR,KLOR-CON) 20 MEQ tablet, TAKE 1 TABLET BY MOUTH EVERY MORNING, Disp: 30 tablet, Rfl: 4 .  QUEtiapine (SEROQUEL) 200 MG tablet, TAKE 1 TABLET BY MOUTH AT BEDTIME, Disp: 30 tablet, Rfl: 4 .  simvastatin (ZOCOR) 40 MG tablet, TAKE 1 TABLET BY MOUTH AT BEDTIME, Disp: 30 tablet, Rfl: 2 .  XARELTO 10 MG TABS tablet, TAKE 1 TABLET BY MOUTH DAILY, Disp: 30 tablet, Rfl: 2 .  albuterol (PROAIR HFA) 108 (90 Base) MCG/ACT inhaler, Inhale 2 puffs into the lungs every 6 (six) hours as needed for wheezing or shortness of breath. (Patient not taking: Reported on 02/17/2018), Disp: 8.5 g, Rfl: 1 .  ketoconazole (NIZORAL) 2 % cream, Apply 1 application topically daily. (Patient not taking: Reported on 02/17/2018), Disp: 30 g, Rfl: 1 Social History   Socioeconomic History  . Marital status: Married    Spouse name: Not on file  . Number of children: Not on file  . Years of education: Not on file  . Highest education level: Not on file  Occupational History  . Not on file  Social Needs  . Financial resource strain: Not on file  . Food insecurity:    Worry: Not on  file    Inability: Not on file  . Transportation needs:    Medical: Not on file    Non-medical: Not on file  Tobacco Use  . Smoking status: Current Every Day Smoker    Packs/day: 2.00    Years: 40.00    Pack years: 80.00    Types: Cigarettes    Last attempt to quit: 03/23/2001    Years since quitting: 16.9  . Smokeless tobacco: Never Used  . Tobacco comment: EXPOSED TO PASSIVE SMOKE  Substance and Sexual Activity  . Alcohol use: No  . Drug use: No  . Sexual activity:  Never    Birth control/protection: Surgical  Lifestyle  . Physical activity:    Days per week: Not on file    Minutes per session: Not on file  . Stress: Not on file  Relationships  . Social connections:    Talks on phone: Not on file    Gets together: Not on file    Attends religious service: Not on file    Active member of club or organization: Not on file    Attends meetings of clubs or organizations: Not on file    Relationship status: Not on file  . Intimate partner violence:    Fear of current or ex partner: Not on file    Emotionally abused: Not on file    Physically abused: Not on file    Forced sexual activity: Not on file  Other Topics Concern  . Not on file  Social History Narrative   She is a married mother of 6, she has raised one 37 year old grandson. She was a former smoker of at least 50-pack-years but quit in 2006. She recently purchased an exercise bicycle, but is not been actively using it. She is trying to become more active. She denies any active alcohol use.   Her husband was recently diagnosed with urologic cancer, so she is somewhat "frazzled in her overall demeanor.)   Family History  Problem Relation Age of Onset  . Heart disease Mother   . Stroke Mother   . Dementia Mother   . Prostate cancer Father   . Cancer Father        bone  . COPD Sister   . Heart disease Sister   . Goiter Sister   . Cancer Sister        THYROID  . Early death Brother 8  . Cancer Brother         leukemia  . Liver cancer Brother   . Alzheimer's disease Unknown        family history  . Cancer Daughter        breast  . Cancer Paternal Uncle        unsure of what kind    Objective: Office vital signs reviewed. BP 138/77   Pulse 74   Temp 98.2 F (36.8 C) (Oral)   Ht 5\' 2"  (1.575 m)   Wt 189 lb (85.7 kg)   BMI 34.57 kg/m   Physical Examination:  General: Awake, alert, nontoxic, No acute distress GU: no suprapubic TTP; +Left sided CVA TTP Neuro: does not appear to be responding to internal stimuli.  Assessment/ Plan: 72 y.o. female   1. Pyelonephritis, acute Patient is afebrile nontoxic appearing on exam.  She does not appear to be responding to internal stimuli.  Given left-sided CVA tenderness to palpation, reports of fevers at home and evidence of UTI on urinalysis, I have started treatment for acute pyelonephritis.  Rocephin 1 g intramuscularly was administered today.  She will start Keflex 250 mg this evening, with plans to treat every 6 hours for the next 7 days.  Urine culture has been sent.  Check CBC and BMP.  I will contact the daughter tomorrow with results.  We discussed that if her symptoms were to worsen overnight, she is to seek immediate medical attention the emergency department.  I do think that her delirium is related to acute infection.  She is not demonstrating any signs of systemic infection at this time.  We discussed low threshold for emergency department eval if symptoms are  not improving despite use of antibiotics. - Urinalysis, Complete - CBC with Differential - Basic Metabolic Panel - cefTRIAXone (ROCEPHIN) injection 1 g - Urine Culture  2. Delirium As above   Orders Placed This Encounter  Procedures  . Urine Culture  . Urinalysis, Complete  . CBC with Differential  . Basic Metabolic Panel   Meds ordered this encounter  Medications  . cefTRIAXone (ROCEPHIN) injection 1 g  . cephALEXin (KEFLEX) 250 MG capsule    Sig: Take 1 capsule  (250 mg total) by mouth 4 (four) times daily for 7 days.    Dispense:  28 capsule    Refill:  0     Ashly Hulen Skains, DO [facility] [phone]

## 2018-02-17 NOTE — Telephone Encounter (Signed)
NA

## 2018-02-18 LAB — CBC WITH DIFFERENTIAL/PLATELET
BASOS: 0 %
Basophils Absolute: 0 10*3/uL (ref 0.0–0.2)
EOS (ABSOLUTE): 0.1 10*3/uL (ref 0.0–0.4)
EOS: 1 %
HEMATOCRIT: 38.6 % (ref 34.0–46.6)
HEMOGLOBIN: 12.8 g/dL (ref 11.1–15.9)
IMMATURE GRANS (ABS): 0 10*3/uL (ref 0.0–0.1)
IMMATURE GRANULOCYTES: 1 %
LYMPHS: 25 %
Lymphocytes Absolute: 2.1 10*3/uL (ref 0.7–3.1)
MCH: 31.5 pg (ref 26.6–33.0)
MCHC: 33.2 g/dL (ref 31.5–35.7)
MCV: 95 fL (ref 79–97)
MONOCYTES: 9 %
MONOS ABS: 0.8 10*3/uL (ref 0.1–0.9)
NEUTROS PCT: 64 %
Neutrophils Absolute: 5.4 10*3/uL (ref 1.4–7.0)
Platelets: 232 10*3/uL (ref 150–450)
RBC: 4.06 x10E6/uL (ref 3.77–5.28)
RDW: 14.1 % (ref 12.3–15.4)
WBC: 8.3 10*3/uL (ref 3.4–10.8)

## 2018-02-18 LAB — BASIC METABOLIC PANEL
BUN/Creatinine Ratio: 21 (ref 12–28)
BUN: 27 mg/dL (ref 8–27)
CALCIUM: 9.7 mg/dL (ref 8.7–10.3)
CO2: 29 mmol/L (ref 20–29)
CREATININE: 1.26 mg/dL — AB (ref 0.57–1.00)
Chloride: 96 mmol/L (ref 96–106)
GFR, EST AFRICAN AMERICAN: 50 mL/min/{1.73_m2} — AB (ref >59)
GFR, EST NON AFRICAN AMERICAN: 43 mL/min/{1.73_m2} — AB (ref >59)
Glucose: 129 mg/dL — ABNORMAL HIGH (ref 65–99)
POTASSIUM: 4.8 mmol/L (ref 3.5–5.2)
Sodium: 141 mmol/L (ref 134–144)

## 2018-02-19 LAB — URINE CULTURE

## 2018-02-20 ENCOUNTER — Other Ambulatory Visit: Payer: Self-pay | Admitting: Family Medicine

## 2018-02-20 MED ORDER — SULFAMETHOXAZOLE-TRIMETHOPRIM 800-160 MG PO TABS: 1.0000 | ORAL_TABLET | Freq: Two times a day (BID) | ORAL | 0 refills | Status: DC

## 2018-02-23 ENCOUNTER — Encounter (HOSPITAL_COMMUNITY): Payer: Self-pay | Admitting: Emergency Medicine

## 2018-02-23 ENCOUNTER — Inpatient Hospital Stay (HOSPITAL_COMMUNITY)
Admission: EM | Admit: 2018-02-23 | Discharge: 2018-02-27 | DRG: 682 | Disposition: A | Discharge status: Home / Self Care | Payer: Medicare HMO | Attending: Internal Medicine | Admitting: Internal Medicine

## 2018-02-23 ENCOUNTER — Other Ambulatory Visit: Payer: Self-pay

## 2018-02-23 ENCOUNTER — Emergency Department (HOSPITAL_COMMUNITY): Payer: Medicare HMO

## 2018-02-23 DIAGNOSIS — F03918 Unspecified dementia, unspecified severity, with other behavioral disturbance: Secondary | ICD-10-CM

## 2018-02-23 DIAGNOSIS — G8929 Other chronic pain: Secondary | ICD-10-CM | POA: Diagnosis present

## 2018-02-23 DIAGNOSIS — F329 Major depressive disorder, single episode, unspecified: Secondary | ICD-10-CM | POA: Diagnosis present

## 2018-02-23 DIAGNOSIS — E785 Hyperlipidemia, unspecified: Secondary | ICD-10-CM | POA: Diagnosis present

## 2018-02-23 DIAGNOSIS — K5900 Constipation, unspecified: Secondary | ICD-10-CM | POA: Diagnosis present

## 2018-02-23 DIAGNOSIS — R531 Weakness: Secondary | ICD-10-CM | POA: Diagnosis present

## 2018-02-23 DIAGNOSIS — I10 Essential (primary) hypertension: Secondary | ICD-10-CM | POA: Diagnosis present

## 2018-02-23 DIAGNOSIS — E860 Dehydration: Secondary | ICD-10-CM | POA: Diagnosis not present

## 2018-02-23 DIAGNOSIS — R451 Restlessness and agitation: Secondary | ICD-10-CM | POA: Diagnosis present

## 2018-02-23 DIAGNOSIS — I509 Heart failure, unspecified: Secondary | ICD-10-CM | POA: Diagnosis not present

## 2018-02-23 DIAGNOSIS — M810 Age-related osteoporosis without current pathological fracture: Secondary | ICD-10-CM | POA: Diagnosis present

## 2018-02-23 DIAGNOSIS — E669 Obesity, unspecified: Secondary | ICD-10-CM | POA: Diagnosis present

## 2018-02-23 DIAGNOSIS — D631 Anemia in chronic kidney disease: Secondary | ICD-10-CM | POA: Diagnosis present

## 2018-02-23 DIAGNOSIS — Z86711 Personal history of pulmonary embolism: Secondary | ICD-10-CM

## 2018-02-23 DIAGNOSIS — R443 Hallucinations, unspecified: Secondary | ICD-10-CM | POA: Diagnosis not present

## 2018-02-23 DIAGNOSIS — J439 Emphysema, unspecified: Secondary | ICD-10-CM | POA: Diagnosis not present

## 2018-02-23 DIAGNOSIS — Z7984 Long term (current) use of oral hypoglycemic drugs: Secondary | ICD-10-CM

## 2018-02-23 DIAGNOSIS — I130 Hypertensive heart and chronic kidney disease with heart failure and stage 1 through stage 4 chronic kidney disease, or unspecified chronic kidney disease: Secondary | ICD-10-CM | POA: Diagnosis not present

## 2018-02-23 DIAGNOSIS — Z91040 Latex allergy status: Secondary | ICD-10-CM

## 2018-02-23 DIAGNOSIS — E872 Acidosis, unspecified: Secondary | ICD-10-CM

## 2018-02-23 DIAGNOSIS — R402143 Coma scale, eyes open, spontaneous, at hospital admission: Secondary | ICD-10-CM | POA: Diagnosis present

## 2018-02-23 DIAGNOSIS — Z7901 Long term (current) use of anticoagulants: Secondary | ICD-10-CM | POA: Diagnosis not present

## 2018-02-23 DIAGNOSIS — I2510 Atherosclerotic heart disease of native coronary artery without angina pectoris: Secondary | ICD-10-CM | POA: Diagnosis present

## 2018-02-23 DIAGNOSIS — Z884 Allergy status to anesthetic agent status: Secondary | ICD-10-CM

## 2018-02-23 DIAGNOSIS — F0391 Unspecified dementia with behavioral disturbance: Secondary | ICD-10-CM | POA: Diagnosis not present

## 2018-02-23 DIAGNOSIS — R112 Nausea with vomiting, unspecified: Secondary | ICD-10-CM | POA: Diagnosis not present

## 2018-02-23 DIAGNOSIS — F0390 Unspecified dementia without behavioral disturbance: Secondary | ICD-10-CM | POA: Diagnosis present

## 2018-02-23 DIAGNOSIS — R400 Somnolence: Secondary | ICD-10-CM

## 2018-02-23 DIAGNOSIS — I252 Old myocardial infarction: Secondary | ICD-10-CM

## 2018-02-23 DIAGNOSIS — Z79899 Other long term (current) drug therapy: Secondary | ICD-10-CM

## 2018-02-23 DIAGNOSIS — N183 Chronic kidney disease, stage 3 unspecified: Secondary | ICD-10-CM

## 2018-02-23 DIAGNOSIS — G9341 Metabolic encephalopathy: Secondary | ICD-10-CM

## 2018-02-23 DIAGNOSIS — G9340 Encephalopathy, unspecified: Secondary | ICD-10-CM | POA: Diagnosis not present

## 2018-02-23 DIAGNOSIS — E1122 Type 2 diabetes mellitus with diabetic chronic kidney disease: Secondary | ICD-10-CM | POA: Diagnosis not present

## 2018-02-23 DIAGNOSIS — E039 Hypothyroidism, unspecified: Secondary | ICD-10-CM | POA: Diagnosis present

## 2018-02-23 DIAGNOSIS — N3289 Other specified disorders of bladder: Secondary | ICD-10-CM | POA: Diagnosis present

## 2018-02-23 DIAGNOSIS — R4020 Unspecified coma: Secondary | ICD-10-CM | POA: Diagnosis not present

## 2018-02-23 DIAGNOSIS — N179 Acute kidney failure, unspecified: Secondary | ICD-10-CM | POA: Diagnosis not present

## 2018-02-23 DIAGNOSIS — Z6835 Body mass index (BMI) 35.0-35.9, adult: Secondary | ICD-10-CM

## 2018-02-23 DIAGNOSIS — J449 Chronic obstructive pulmonary disease, unspecified: Secondary | ICD-10-CM | POA: Diagnosis present

## 2018-02-23 DIAGNOSIS — R402243 Coma scale, best verbal response, confused conversation, at hospital admission: Secondary | ICD-10-CM | POA: Diagnosis present

## 2018-02-23 DIAGNOSIS — Z885 Allergy status to narcotic agent status: Secondary | ICD-10-CM

## 2018-02-23 DIAGNOSIS — F419 Anxiety disorder, unspecified: Secondary | ICD-10-CM | POA: Diagnosis present

## 2018-02-23 DIAGNOSIS — K219 Gastro-esophageal reflux disease without esophagitis: Secondary | ICD-10-CM | POA: Diagnosis not present

## 2018-02-23 DIAGNOSIS — Z880 Allergy status to penicillin: Secondary | ICD-10-CM

## 2018-02-23 DIAGNOSIS — R1110 Vomiting, unspecified: Secondary | ICD-10-CM

## 2018-02-23 DIAGNOSIS — F17210 Nicotine dependence, cigarettes, uncomplicated: Secondary | ICD-10-CM | POA: Diagnosis present

## 2018-02-23 MED ORDER — SODIUM CHLORIDE 0.9 % IV BOLUS
500.0000 mL | Freq: Once | INTRAVENOUS | Status: AC
  Administered 2018-02-24: 500 mL via INTRAVENOUS

## 2018-02-23 MED ORDER — SODIUM CHLORIDE 0.9 % IV SOLN
1.0000 g | Freq: Once | INTRAVENOUS | Status: AC
  Administered 2018-02-24: 1 g via INTRAVENOUS
  Filled 2018-02-23: qty 10

## 2018-02-23 MED ORDER — SODIUM CHLORIDE 0.9 % IV BOLUS
1000.0000 mL | Freq: Once | INTRAVENOUS | Status: AC
  Administered 2018-02-24: 1000 mL via INTRAVENOUS

## 2018-02-23 NOTE — ED Triage Notes (Signed)
Pt was diagnosed with UTI a week ago and per her daughter; pt has not voided since noon today. Pt has dementia but daughter states it is getting worse as pt is "seeing things that are not there and talking to people who are not there". Pt also running grade temp per daughter (101.5).

## 2018-02-24 ENCOUNTER — Encounter (HOSPITAL_COMMUNITY): Payer: Self-pay

## 2018-02-24 ENCOUNTER — Other Ambulatory Visit: Payer: Self-pay

## 2018-02-24 DIAGNOSIS — N179 Acute kidney failure, unspecified: Secondary | ICD-10-CM | POA: Diagnosis present

## 2018-02-24 DIAGNOSIS — F0391 Unspecified dementia with behavioral disturbance: Secondary | ICD-10-CM

## 2018-02-24 DIAGNOSIS — E669 Obesity, unspecified: Secondary | ICD-10-CM

## 2018-02-24 DIAGNOSIS — J439 Emphysema, unspecified: Secondary | ICD-10-CM | POA: Diagnosis present

## 2018-02-24 DIAGNOSIS — N183 Chronic kidney disease, stage 3 (moderate): Secondary | ICD-10-CM | POA: Diagnosis present

## 2018-02-24 DIAGNOSIS — J449 Chronic obstructive pulmonary disease, unspecified: Secondary | ICD-10-CM | POA: Diagnosis not present

## 2018-02-24 DIAGNOSIS — G9341 Metabolic encephalopathy: Secondary | ICD-10-CM | POA: Diagnosis present

## 2018-02-24 DIAGNOSIS — N3289 Other specified disorders of bladder: Secondary | ICD-10-CM | POA: Diagnosis present

## 2018-02-24 DIAGNOSIS — R443 Hallucinations, unspecified: Secondary | ICD-10-CM | POA: Diagnosis present

## 2018-02-24 DIAGNOSIS — G8929 Other chronic pain: Secondary | ICD-10-CM | POA: Diagnosis present

## 2018-02-24 DIAGNOSIS — R402143 Coma scale, eyes open, spontaneous, at hospital admission: Secondary | ICD-10-CM | POA: Diagnosis present

## 2018-02-24 DIAGNOSIS — E872 Acidosis, unspecified: Secondary | ICD-10-CM

## 2018-02-24 DIAGNOSIS — I130 Hypertensive heart and chronic kidney disease with heart failure and stage 1 through stage 4 chronic kidney disease, or unspecified chronic kidney disease: Secondary | ICD-10-CM | POA: Diagnosis present

## 2018-02-24 DIAGNOSIS — R402243 Coma scale, best verbal response, confused conversation, at hospital admission: Secondary | ICD-10-CM | POA: Diagnosis present

## 2018-02-24 DIAGNOSIS — K5900 Constipation, unspecified: Secondary | ICD-10-CM | POA: Diagnosis present

## 2018-02-24 DIAGNOSIS — I509 Heart failure, unspecified: Secondary | ICD-10-CM | POA: Diagnosis present

## 2018-02-24 DIAGNOSIS — F329 Major depressive disorder, single episode, unspecified: Secondary | ICD-10-CM | POA: Diagnosis present

## 2018-02-24 DIAGNOSIS — G9340 Encephalopathy, unspecified: Secondary | ICD-10-CM | POA: Diagnosis present

## 2018-02-24 DIAGNOSIS — E785 Hyperlipidemia, unspecified: Secondary | ICD-10-CM

## 2018-02-24 DIAGNOSIS — I2510 Atherosclerotic heart disease of native coronary artery without angina pectoris: Secondary | ICD-10-CM | POA: Diagnosis present

## 2018-02-24 DIAGNOSIS — Z7901 Long term (current) use of anticoagulants: Secondary | ICD-10-CM | POA: Diagnosis not present

## 2018-02-24 DIAGNOSIS — D631 Anemia in chronic kidney disease: Secondary | ICD-10-CM | POA: Diagnosis present

## 2018-02-24 DIAGNOSIS — K219 Gastro-esophageal reflux disease without esophagitis: Secondary | ICD-10-CM | POA: Diagnosis present

## 2018-02-24 DIAGNOSIS — M810 Age-related osteoporosis without current pathological fracture: Secondary | ICD-10-CM | POA: Diagnosis present

## 2018-02-24 DIAGNOSIS — F03918 Unspecified dementia, unspecified severity, with other behavioral disturbance: Secondary | ICD-10-CM

## 2018-02-24 DIAGNOSIS — E039 Hypothyroidism, unspecified: Secondary | ICD-10-CM | POA: Diagnosis present

## 2018-02-24 DIAGNOSIS — E860 Dehydration: Secondary | ICD-10-CM

## 2018-02-24 DIAGNOSIS — E1122 Type 2 diabetes mellitus with diabetic chronic kidney disease: Secondary | ICD-10-CM | POA: Diagnosis present

## 2018-02-24 DIAGNOSIS — F419 Anxiety disorder, unspecified: Secondary | ICD-10-CM | POA: Diagnosis present

## 2018-02-24 LAB — COMPREHENSIVE METABOLIC PANEL
ALK PHOS: 52 U/L (ref 38–126)
ALT: 17 U/L (ref 0–44)
AST: 18 U/L (ref 15–41)
Albumin: 3.5 g/dL (ref 3.5–5.0)
Anion gap: 12 (ref 5–15)
BUN: 22 mg/dL (ref 8–23)
CALCIUM: 9.3 mg/dL (ref 8.9–10.3)
CO2: 29 mmol/L (ref 22–32)
Chloride: 98 mmol/L (ref 98–111)
Creatinine, Ser: 1.75 mg/dL — ABNORMAL HIGH (ref 0.44–1.00)
GFR calc Af Amer: 33 mL/min — ABNORMAL LOW (ref >60)
GFR calc non Af Amer: 28 mL/min — ABNORMAL LOW (ref >60)
GLUCOSE: 103 mg/dL — AB (ref 70–99)
Potassium: 4.1 mmol/L (ref 3.5–5.1)
Sodium: 139 mmol/L (ref 135–145)
Total Bilirubin: 0.6 mg/dL (ref 0.3–1.2)
Total Protein: 7.3 g/dL (ref 6.5–8.1)

## 2018-02-24 LAB — CBC WITH DIFFERENTIAL/PLATELET
BASOS PCT: 1 %
Basophils Absolute: 0 10*3/uL (ref 0.0–0.1)
EOS PCT: 2 %
Eosinophils Absolute: 0.1 10*3/uL (ref 0.0–0.7)
HCT: 39.4 % (ref 36.0–46.0)
Hemoglobin: 12.9 g/dL (ref 12.0–15.0)
Lymphocytes Relative: 44 %
Lymphs Abs: 2.8 10*3/uL (ref 0.7–4.0)
MCH: 31.4 pg (ref 26.0–34.0)
MCHC: 32.7 g/dL (ref 30.0–36.0)
MCV: 95.9 fL (ref 78.0–100.0)
MONO ABS: 0.6 10*3/uL (ref 0.1–1.0)
MONOS PCT: 9 %
Neutro Abs: 2.8 10*3/uL (ref 1.7–7.7)
Neutrophils Relative %: 44 %
Platelets: 232 10*3/uL (ref 150–400)
RBC: 4.11 MIL/uL (ref 3.87–5.11)
RDW: 13.2 % (ref 11.5–15.5)
WBC: 6.3 10*3/uL (ref 4.0–10.5)

## 2018-02-24 LAB — URINALYSIS, ROUTINE W REFLEX MICROSCOPIC
Bilirubin Urine: NEGATIVE
GLUCOSE, UA: NEGATIVE mg/dL
HGB URINE DIPSTICK: NEGATIVE
Ketones, ur: NEGATIVE mg/dL
Leukocytes, UA: NEGATIVE
Nitrite: NEGATIVE
Protein, ur: NEGATIVE mg/dL
SPECIFIC GRAVITY, URINE: 1.013 (ref 1.005–1.030)
pH: 5 (ref 5.0–8.0)

## 2018-02-24 LAB — GLUCOSE, CAPILLARY
Glucose-Capillary: 186 mg/dL — ABNORMAL HIGH (ref 70–99)
Glucose-Capillary: 159 mg/dL — ABNORMAL HIGH (ref 70–99)
Glucose-Capillary: 121 mg/dL — ABNORMAL HIGH (ref 70–99)

## 2018-02-24 LAB — AMMONIA: AMMONIA: 13 umol/L (ref 9–35)

## 2018-02-24 LAB — CK: Total CK: 41 U/L (ref 38–234)

## 2018-02-24 LAB — BASIC METABOLIC PANEL
Anion gap: 8 (ref 5–15)
BUN: 21 mg/dL (ref 8–23)
CHLORIDE: 107 mmol/L (ref 98–111)
CO2: 27 mmol/L (ref 22–32)
CREATININE: 1.68 mg/dL — AB (ref 0.44–1.00)
Calcium: 8.2 mg/dL — ABNORMAL LOW (ref 8.9–10.3)
GFR calc Af Amer: 34 mL/min — ABNORMAL LOW (ref >60)
GFR calc non Af Amer: 30 mL/min — ABNORMAL LOW (ref >60)
Glucose, Bld: 79 mg/dL (ref 70–99)
POTASSIUM: 4.1 mmol/L (ref 3.5–5.1)
SODIUM: 142 mmol/L (ref 135–145)

## 2018-02-24 LAB — VALPROIC ACID LEVEL: Valproic Acid Lvl: 35 ug/mL — ABNORMAL LOW (ref 50.0–100.0)

## 2018-02-24 LAB — LACTIC ACID, PLASMA
LACTIC ACID, VENOUS: 2.4 mmol/L — AB (ref 0.5–1.9)
LACTIC ACID, VENOUS: 2.4 mmol/L — AB (ref 0.5–1.9)
Lactic Acid, Venous: 3 mmol/L (ref 0.5–1.9)

## 2018-02-24 LAB — VITAMIN B12: Vitamin B-12: 393 pg/mL (ref 180–914)

## 2018-02-24 LAB — CBG MONITORING, ED: GLUCOSE-CAPILLARY: 157 mg/dL — AB (ref 70–99)

## 2018-02-24 LAB — FOLATE: FOLATE: 6 ng/mL (ref >5.9)

## 2018-02-24 LAB — TSH: TSH: 2.753 u[IU]/mL (ref 0.350–4.500)

## 2018-02-24 MED ORDER — HYDRALAZINE HCL 20 MG/ML IJ SOLN: 10.0000 mg | INTRAMUSCULAR | Status: DC | PRN

## 2018-02-24 MED ORDER — LEVOTHYROXINE SODIUM 88 MCG PO TABS
88.0000 ug | ORAL_TABLET | Freq: Every day | ORAL | Status: DC
  Administered 2018-02-24 – 2018-02-27 (×4): 88 ug via ORAL
  Filled 2018-02-24 (×4): qty 1

## 2018-02-24 MED ORDER — ONDANSETRON HCL 4 MG PO TABS: 4.0000 mg | ORAL_TABLET | Freq: Four times a day (QID) | ORAL | Status: DC | PRN

## 2018-02-24 MED ORDER — ACETAMINOPHEN 650 MG RE SUPP: 650.0000 mg | Freq: Four times a day (QID) | RECTAL | Status: DC | PRN

## 2018-02-24 MED ORDER — SODIUM CHLORIDE 0.9 % IV SOLN
INTRAVENOUS | Status: DC
  Administered 2018-02-24 (×2): via INTRAVENOUS

## 2018-02-24 MED ORDER — RIVAROXABAN 10 MG PO TABS
10.0000 mg | ORAL_TABLET | Freq: Every evening | ORAL | Status: DC
  Administered 2018-02-24 – 2018-02-26 (×3): 10 mg via ORAL
  Filled 2018-02-24 (×7): qty 1

## 2018-02-24 MED ORDER — VITAMIN D 1000 UNITS PO TABS
5000.0000 [IU] | ORAL_TABLET | Freq: Every day | ORAL | Status: DC
  Administered 2018-02-24 – 2018-02-27 (×4): 5000 [IU] via ORAL
  Filled 2018-02-24 (×4): qty 5

## 2018-02-24 MED ORDER — ACETAMINOPHEN 325 MG PO TABS: 650.0000 mg | ORAL_TABLET | Freq: Four times a day (QID) | ORAL | Status: DC | PRN

## 2018-02-24 MED ORDER — PANTOPRAZOLE SODIUM 40 MG PO TBEC
40.0000 mg | DELAYED_RELEASE_TABLET | Freq: Two times a day (BID) | ORAL | Status: DC
  Administered 2018-02-24 – 2018-02-27 (×7): 40 mg via ORAL
  Filled 2018-02-24 (×7): qty 1

## 2018-02-24 MED ORDER — DIVALPROEX SODIUM 125 MG PO CSDR
250.0000 mg | DELAYED_RELEASE_CAPSULE | Freq: Every day | ORAL | Status: DC
  Administered 2018-02-24 – 2018-02-27 (×4): 250 mg via ORAL
  Filled 2018-02-24 (×9): qty 2

## 2018-02-24 MED ORDER — METOPROLOL SUCCINATE ER 25 MG PO TB24
12.5000 mg | ORAL_TABLET | Freq: Every morning | ORAL | Status: DC
  Administered 2018-02-24 – 2018-02-27 (×4): 12.5 mg via ORAL
  Filled 2018-02-24 (×4): qty 1

## 2018-02-24 MED ORDER — OXYBUTYNIN CHLORIDE 5 MG PO TABS
5.0000 mg | ORAL_TABLET | Freq: Three times a day (TID) | ORAL | Status: DC
  Administered 2018-02-24 (×2): 5 mg via ORAL
  Filled 2018-02-24 (×2): qty 1

## 2018-02-24 MED ORDER — INSULIN ASPART 100 UNIT/ML ~~LOC~~ SOLN: 0.0000 [IU] | Freq: Every day | SUBCUTANEOUS | Status: DC

## 2018-02-24 MED ORDER — DONEPEZIL HCL 5 MG PO TABS
10.0000 mg | ORAL_TABLET | Freq: Two times a day (BID) | ORAL | Status: DC
  Administered 2018-02-24 – 2018-02-27 (×7): 10 mg via ORAL
  Filled 2018-02-24 (×7): qty 2

## 2018-02-24 MED ORDER — SODIUM CHLORIDE 0.9 % IV BOLUS
1000.0000 mL | Freq: Once | INTRAVENOUS | Status: AC
  Administered 2018-02-24: 1000 mL via INTRAVENOUS

## 2018-02-24 MED ORDER — DIVALPROEX SODIUM 125 MG PO CSDR
500.0000 mg | DELAYED_RELEASE_CAPSULE | Freq: Every day | ORAL | Status: DC
  Administered 2018-02-24 – 2018-02-26 (×3): 500 mg via ORAL
  Filled 2018-02-24 (×6): qty 4

## 2018-02-24 MED ORDER — SIMVASTATIN 20 MG PO TABS
40.0000 mg | ORAL_TABLET | Freq: Every day | ORAL | Status: DC
  Administered 2018-02-24 – 2018-02-25 (×2): 40 mg via ORAL
  Filled 2018-02-24 (×2): qty 2

## 2018-02-24 MED ORDER — OMEGA-3-ACID ETHYL ESTERS 1 G PO CAPS
1.0000 | ORAL_CAPSULE | ORAL | Status: DC
  Administered 2018-02-25 – 2018-02-27 (×3): 1 g via ORAL
  Filled 2018-02-24 (×3): qty 1

## 2018-02-24 MED ORDER — BISACODYL 10 MG RE SUPP: 10.0000 mg | Freq: Once | RECTAL | Status: DC

## 2018-02-24 MED ORDER — INSULIN ASPART 100 UNIT/ML ~~LOC~~ SOLN: 0.0000 [IU] | Freq: Three times a day (TID) | SUBCUTANEOUS | Status: DC

## 2018-02-24 MED ORDER — ONDANSETRON HCL 4 MG/2ML IJ SOLN
4.0000 mg | Freq: Four times a day (QID) | INTRAMUSCULAR | Status: DC | PRN
  Administered 2018-02-25 – 2018-02-26 (×3): 4 mg via INTRAVENOUS
  Filled 2018-02-24 (×3): qty 2

## 2018-02-24 MED ORDER — INSULIN ASPART 100 UNIT/ML ~~LOC~~ SOLN
0.0000 [IU] | Freq: Three times a day (TID) | SUBCUTANEOUS | Status: DC
  Administered 2018-02-24 (×2): 2 [IU] via SUBCUTANEOUS
  Administered 2018-02-25 – 2018-02-27 (×4): 1 [IU] via SUBCUTANEOUS
  Administered 2018-02-27: 2 [IU] via SUBCUTANEOUS

## 2018-02-24 MED ORDER — LACTATED RINGERS IV SOLN
INTRAVENOUS | Status: DC
  Administered 2018-02-24: 06:00:00 via INTRAVENOUS

## 2018-02-24 MED ORDER — HALOPERIDOL LACTATE 5 MG/ML IJ SOLN: 5.0000 mg | Freq: Four times a day (QID) | INTRAMUSCULAR | Status: DC | PRN

## 2018-02-24 NOTE — ED Notes (Signed)
Date and time results received: 02/24/18 2:24 AM    Test: Lactic Critical Value: 2.4  Name of Provider Notified: Dr Lynelle DoctorKnapp  Orders Received? Or Actions Taken?: MD notified

## 2018-02-24 NOTE — ED Notes (Addendum)
Date and time results received: 02/24/18 5:43 AM   Test: lactic Critical Value: 2.4  Name of Provider Notified: Dr Sherryll BurgerShah  Orders Received? Or Actions Taken?: md notified

## 2018-02-24 NOTE — ED Notes (Signed)
Bladder scan showed approx 75 mL

## 2018-02-24 NOTE — Progress Notes (Signed)
Pt having increased periods of getting out of bed regardless of bed alarm, taking frequent staff re-orientation. Pt pulled out IV while trying to get out of bed. RN worried that patient may fall due to multiple attempts to get up, MD made aware. Order for safety sitter placed for patient.

## 2018-02-24 NOTE — H&P (Signed)
History and Physical    [name] ZOX:[id number] DOB: May 24, 1946 DOA: 02/23/2018  PCP: Raliegh Ip, DO   Patient coming from: Home  Chief Complaint: Hallucinations/altered mentation  HPI: [name] is a 72 y.o. female with medical history significant for CKD stage III, dementia, recent UTI, prior PE on Xarelto, hypertension, dyslipidemia, type 2 diabetes, hypothyroidism, and GERD who was brought to the ED by her daughter as her mother appeared not to be improving on antibiotics recently prescribed for UTI.  She has had some worsening confusion as well as some hallucinations that are visual and auditory in nature.  She has apparently been talking to people who have been dead for 20 years.  She reports that her mother is also had a decreased appetite as well as some nausea, but no vomiting.  She has also noted some generalized weakness.  Patient is also noted to have some generalized abdominal discomfort and fullness with no bowel movement noted in the last 2 to 3 days.  Daughter is the primary historian and she denies any known fevers or chills.  Patient did receive Rocephin at the doctor's office 1 week ago and was initially started on Keflex which was changed to ciprofloxacin on 9/12.  Urine culture shows E. coli with sensitivity to both Rocephin as well as ciprofloxacin.   ED Course: Vital signs are stable and urine analysis has no findings of UTI at this time.  Her labs indicate creatinine of 1.75 with baseline closer to 1.1-1.3.  Lactic acid was noted to be 3 and has diminished to 2.3 on repeat.  Patient has received 2.5 L of IV fluid as well as Rocephin.  CT of the head was performed with no acute findings.  Review of Systems: Cannot be adequately obtained due to patient condition.  Past Medical History:  Diagnosis Date  . Anemia   . Anxiety   . Anxiety and depression, ( ? early dementia) 08/14/2011  . Arthritis   . Asthma   . Blood transfusion 2007   History of  .  Chronic kidney disease    lt kidney limited fxn  . Chronic pain   . COPD (chronic obstructive pulmonary disease) with emphysema (HCC)   . DDD (degenerative disc disease)   . Dementia   . Depression   . Diabetes mellitus   . Gastroesophageal reflux disease   . Headache(784.0)   . Heart murmur    No significant valvular lesions on echo  . Heart palpitations   . History of CHF (congestive heart failure) 2007   2013: Normal EF by echo x2 with no significant diastolic dysfunction noted either  . Hyperlipidemia   . Hypertension   . Hypothyroidism (acquired)    On Levothyroxine  . Myocardial infarction (HCC)   . Osteoporosis   . PE (pulmonary embolism) February 2007; March 2013   Bilateral PEs in 07; massive PE March 2013 with moderate elevation in PA pressures.  . Seizures (HCC)    once  . Uterine prolapse     Past Surgical History:  Procedure Laterality Date  . CARDIAC CATHETERIZATION  March 2013   No evidence of coronary disease; also no significant aortic or iliac or renal artery disease.  . Hiatel Hernia Repair    . LEFT HEART CATHETERIZATION WITH CORONARY ANGIOGRAM N/A 08/16/2011   Procedure: LEFT HEART CATHETERIZATION WITH CORONARY ANGIOGRAM;  Surgeon: Marykay Lex, MD;  Location: [facility] CATH LAB;  Service: Cardiovascular;  Laterality: N/A;  . TUBAL LIGATION  reports that she has been smoking cigarettes. She has a 80.00 pack-year smoking history. She has never used smokeless tobacco. She reports that she does not drink alcohol or use drugs.  Allergies  Allergen Reactions  . Procaine Hcl Anaphylaxis  . Codeine     "knocks me out" - states it is too strong  . Latex Hives and Rash  . Penicillins Diarrhea    Has patient had a PCN reaction causing immediate rash, facial/tongue/throat swelling, SOB or lightheadedness with hypotension: No Has patient had a PCN reaction causing severe rash involving mucus membranes or skin necrosis: No Has patient had a PCN reaction that  required hospitalization No Has patient had a PCN reaction occurring within the last 10 years: No If all of the above answers are "NO", then may proceed with Cephalosporin use.     Family History  Problem Relation Age of Onset  . Heart disease Mother   . Stroke Mother   . Dementia Mother   . Prostate cancer Father   . Cancer Father        bone  . COPD Sister   . Heart disease Sister   . Goiter Sister   . Cancer Sister        THYROID  . Early death Brother 8  . Cancer Brother        leukemia  . Liver cancer Brother   . Alzheimer's disease Unknown        family history  . Cancer Daughter        breast  . Cancer Paternal Uncle        unsure of what kind    Prior to Admission medications   Medication Sig Start Date End Date Taking? Authorizing Provider  Cholecalciferol (VITAMIN D3) 5000 UNITS CAPS Take 5,000 Units by mouth daily.   Yes [provider]  divalproex (DEPAKOTE ER) 250 MG 24 hr tablet Take 1 tab at dinner, 2 tabs at bedtime Patient taking differently: Take 250-500 mg by mouth See admin instructions. Take 1 tab at dinner, 2 tabs at bedtime 07/07/17  Yes Van Clines, MD  donepezil (ARICEPT) 10 MG tablet Take 1 tablet (10 mg total) by mouth 2 (two) times daily. 01/21/17  Yes Elenora Gamma, MD  DULoxetine (CYMBALTA) 30 MG capsule TAKE ONE CAPSULE BY MOUTH EVERY DAY Patient taking differently: Take 30 mg by mouth daily.  01/14/18  Yes Johna Sheriff, MD  furosemide (LASIX) 40 MG tablet TAKE 1/2 TABLET BY MOUTH EVERY DAY Patient taking differently: Take 20 mg by mouth daily.  12/12/17  Yes Dettinger, Elige Radon, MD  gabapentin (NEURONTIN) 300 MG capsule TAKE ONE CAPSULE BY MOUTH TWICE DAILY Patient taking differently: Take 300 mg by mouth 2 (two) times daily.  01/16/18  Yes Hawks, Christy A, FNP  glimepiride (AMARYL) 2 MG tablet TAKE 1 TABLET BY MOUTH EVERY DAY BEFORE BREAKFAST Patient taking differently: Take 2 mg by mouth daily with breakfast.  01/14/18  Yes  Johna Sheriff, MD  levothyroxine (SYNTHROID, LEVOTHROID) 88 MCG tablet TAKE ONE TABLET BY MOUTH EVERY MORNING BEFORE BREAKFAST Patient taking differently: Take 88 mcg by mouth daily before breakfast.  08/18/17  Yes Elenora Gamma, MD  losartan (COZAAR) 25 MG tablet TAKE 1 TABLET BY MOUTH EVERY DAY Patient taking differently: Take 25 mg by mouth daily.  12/12/17  Yes Dettinger, Elige Radon, MD  meclizine (ANTIVERT) 50 MG tablet Take 50 mg by mouth daily. Reported on 09/27/2015   Yes [provider]  metFORMIN (GLUCOPHAGE) 500 MG tablet TAKE 1 TABLET BY MOUTH TWICE DAILY WITH A MEAL Patient taking differently: Take 500 mg by mouth 2 (two) times daily with a meal.  01/16/18  Yes Hawks, Christy A, FNP  metoprolol succinate (TOPROL-XL) 25 MG 24 hr tablet TAKE 1/2 TABLET BY MOUTH EVERY DAY Patient taking differently: Take 12.5 mg by mouth every morning.  09/16/17  Yes Elenora GammaBradshaw, Samuel L, MD  nystatin (NYSTATIN) powder Apply topically 4 (four) times daily. 12/05/17  Yes Elenora GammaBradshaw, Samuel L, MD  Omega-3 Fatty Acids (FISH OIL) 1000 MG CAPS Take 1 capsule by mouth every morning.   Yes [provider]  oxybutynin (DITROPAN) 5 MG tablet TAKE 1 TABLET BY MOUTH THREE TIMES DAILY Patient taking differently: Take 5 mg by mouth 3 (three) times daily.  02/12/18  Yes Gottschalk, Ashly M, DO  pantoprazole (PROTONIX) 20 MG tablet TAKE 1 TABLET BY MOUTH TWICE DAILY Patient taking differently: Take 20 mg by mouth 2 (two) times daily.  12/12/17  Yes Dettinger, Elige RadonJoshua A, MD  potassium chloride SA (K-DUR,KLOR-CON) 20 MEQ tablet TAKE 1 TABLET BY MOUTH EVERY MORNING Patient taking differently: Take 20 mEq by mouth every morning.  12/12/17  Yes Dettinger, Elige RadonJoshua A, MD  QUEtiapine (SEROQUEL) 200 MG tablet TAKE 1 TABLET BY MOUTH AT BEDTIME Patient taking differently: Take 200 mg by mouth at bedtime.  12/12/17  Yes Dettinger, Elige RadonJoshua A, MD  simvastatin (ZOCOR) 40 MG tablet TAKE 1 TABLET BY MOUTH AT BEDTIME Patient taking  differently: Take 40 mg by mouth at bedtime.  01/16/18  Yes Hawks, Christy A, FNP  sulfamethoxazole-trimethoprim (BACTRIM DS) 800-160 MG tablet Take 1 tablet by mouth 2 (two) times daily for 7 days. 02/20/18 02/27/18 Yes Gottschalk, Ashly M, DO  XARELTO 10 MG TABS tablet TAKE 1 TABLET BY MOUTH DAILY Patient taking differently: Take 10 mg by mouth daily.  01/16/18  Yes Hawks, Christy A, FNP  ACCU-CHEK SOFTCLIX LANCETS lancets USE TO CHECK BLOOD SUGAR UP TO TWICE DAILY . DX: E11.9 - TYPE 2 DIABETES MELLITUS 07/26/16   Elenora GammaBradshaw, Samuel L, MD  glucose blood (TRUE METRIX BLOOD GLUCOSE TEST) test strip USE TO CHECK BLOOD GLUCOSE UP TO TWICE DAILY 12/26/17   Elenora GammaBradshaw, Samuel L, MD    Physical Exam: Vitals:   02/23/18 2252 02/23/18 2255 02/24/18 0014  BP:  115/60   Pulse: 63    Resp: 18    Temp: 98.5 F (36.9 C)  98.6 F (37 C)  TempSrc: Oral  Rectal  SpO2: 93%      Constitutional: NAD, calm, comfortable Vitals:   02/23/18 2252 02/23/18 2255 02/24/18 0014  BP:  115/60   Pulse: 63    Resp: 18    Temp: 98.5 F (36.9 C)  98.6 F (37 C)  TempSrc: Oral  Rectal  SpO2: 93%     Eyes: lids and conjunctivae normal ENMT: Mucous membranes are moist.  Neck: normal, supple Respiratory: clear to auscultation bilaterally. Normal respiratory effort. No accessory muscle use.  Cardiovascular: Regular rate and rhythm, no murmurs. No extremity edema. Abdomen: no tenderness, minimal distention and fullness. Bowel sounds positive.  Musculoskeletal:  No joint deformity upper and lower extremities.   Skin: no rashes, lesions, ulcers.  Psychiatric: Cannot be adequately assessed.  Labs on Admission: I have personally reviewed following labs and imaging studies  CBC: Recent Labs  Lab 02/17/18 1556 02/23/18 2347  WBC 8.3 6.3  NEUTROABS 5.4 2.8  HGB 12.8 12.9  HCT 38.6 39.4  MCV 95 95.9  PLT 232 232   Basic Metabolic Panel: Recent Labs  Lab 02/17/18 1556 02/23/18 2347  NA 141 139  K 4.8 4.1  CL 96  98  CO2 29 29  GLUCOSE 129* 103*  BUN 27 22  CREATININE 1.26* 1.75*  CALCIUM 9.7 9.3   GFR: Estimated Creatinine Clearance: 30.4 mL/min (A) (by C-G formula based on SCr of 1.75 mg/dL (H)). Liver Function Tests: Recent Labs  Lab 02/23/18 2347  AST 18  ALT 17  ALKPHOS 52  BILITOT 0.6  PROT 7.3  ALBUMIN 3.5   No results for input(s): LIPASE, AMYLASE in the last 168 hours. No results for input(s): AMMONIA in the last 168 hours. Coagulation Profile: No results for input(s): INR, PROTIME in the last 168 hours. Cardiac Enzymes: No results for input(s): CKTOTAL, CKMB, CKMBINDEX, TROPONINI in the last 168 hours. BNP (last 3 results) No results for input(s): PROBNP in the last 8760 hours. HbA1C: No results for input(s): HGBA1C in the last 72 hours. CBG: No results for input(s): GLUCAP in the last 168 hours. Lipid Profile: No results for input(s): CHOL, HDL, LDLCALC, TRIG, CHOLHDL, LDLDIRECT in the last 72 hours. Thyroid Function Tests: No results for input(s): TSH, T4TOTAL, FREET4, T3FREE, THYROIDAB in the last 72 hours. Anemia Panel: No results for input(s): VITAMINB12, FOLATE, FERRITIN, TIBC, IRON, RETICCTPCT in the last 72 hours. Urine analysis:    Component Value Date/Time   COLORURINE YELLOW 02/24/2018 0020   APPEARANCEUR CLEAR 02/24/2018 0020   APPEARANCEUR Hazy (A) 02/17/2018 1525   LABSPEC 1.013 02/24/2018 0020   PHURINE 5.0 02/24/2018 0020   GLUCOSEU NEGATIVE 02/24/2018 0020   HGBUR NEGATIVE 02/24/2018 0020   BILIRUBINUR NEGATIVE 02/24/2018 0020   BILIRUBINUR Negative 02/17/2018 1525   KETONESUR NEGATIVE 02/24/2018 0020   PROTEINUR NEGATIVE 02/24/2018 0020   UROBILINOGEN 2.0 04/04/2015 1453   UROBILINOGEN 1.0 02/19/2015 2000   NITRITE NEGATIVE 02/24/2018 0020   LEUKOCYTESUR NEGATIVE 02/24/2018 0020   LEUKOCYTESUR 1+ (A) 02/17/2018 1525    Radiological Exams on Admission: Ct Head Wo Contrast  Result Date: 02/24/2018 CLINICAL DATA:  Worsening dimension  altered level of consciousness. EXAM: CT HEAD WITHOUT CONTRAST TECHNIQUE: Contiguous axial images were obtained from the base of the skull through the vertex without intravenous contrast. COMPARISON:  08/01/2016 FINDINGS: Brain: Moderate degree of small vessel ischemia with sulcal and ventricular prominence consistent with superficial and central atrophy, intervally increased in appearance since prior. Midline fourth ventricle and basal cisterns without effacement. No large vascular territory infarction, hemorrhage or midline shift. No intra-axial mass nor extra-axial fluid collections. Basal ganglial calcifications are identified bilaterally. The brainstem and cerebellum are intact without acute appearing abnormality. Vascular: No hyperdense vessel sign. Skull: No acute osseous abnormality or worrisome osseous lesions. No significant calvarial soft tissue swelling. Sinuses/Orbits: Clear paranasal sinuses and mastoids. Intact orbits and globes. Other: None IMPRESSION: There appears to have been some interval progression in cerebral atrophy and chronic appearing small vessel ischemic disease. No acute intracranial abnormality identified. Electronically Signed   By: Tollie Eth M.D.   On: 02/24/2018 00:47    Assessment/Plan Principal Problem:   Acute metabolic encephalopathy Active Problems:   Hyperlipidemia with target LDL less than 100   Essential hypertension, benign   Hx pulmonary embolism   COPD mixed type (HCC)   Chronic anticoagulation, pt will need lifelong anticoagulation   Obesity (BMI 30-39.9)   Gastroesophageal reflux disease without esophagitis   AKI (acute kidney injury) (HCC)   Hypothyroidism  CKD stage 3 due to type 2 diabetes mellitus (HCC)   Dementia without behavioral disturbance   Lactic acidosis    1. Acute metabolic encephalopathy.  This is likely related to some dehydration and AKI at this point.  It appears that her UTI has been adequately treated and patient has even  received 1 dose of Rocephin in ED.  Repeat urine cultures have been obtained, however and this should be followed.  Continue to monitor with repeat neurochecks.  Patient is also noted to have dementia and may simply be having some episodes of delirium related to her illness.  Hold Depakote, Cymbalta, and Seroquel for now. 2. AKI on CKD stage III.  Likely related to dehydration.  Maintain on gentle IV fluid, time-limited as patient has received a 2.5 L fluid bolus in the ED.  Avoid nephrotoxic agents and monitor repeat labs in a.m.  Monitor strict I's and O's. 3. Lactic acidosis.  Maintain on gentle IV fluid; appears to have been downtrending.  Repeat in a.m. 4. Mild constipation.  Will trial suppository. 5. Hypertension.  Hold Lasix, losartan, but continue metoprolol.  Monitor blood pressures carefully.  Hydralazine pushes for now as needed for significant elevations. 6. Dyslipidemia.  Maintain on simvastatin.  Also continue fish oil. 7. Hypothyroidism.  Continue levothyroxine check TSH levels given altered mentation. 8. Type 2 diabetes.  Avoid glimepiride due to poor oral intake and hold metformin with acute kidney injury.  Maintain on sliding scale insulin. 9. Dementia.  Continue on Aricept. 10. Recent UTI with possible bladder spasms.  Continue Ditropan.  No need for further antibiotics noted at this time as patient appears to have received adequate treatment with clear urine analysis. 11. History of PE.  Continue Xarelto.   DVT prophylaxis: Xarelto Code Status: Full Family Communication: Daughter at bedside Disposition Plan: IV fluid hydration and monitoring of acute kidney injury Consults called: None Admission status: Observation, MedSurg   Johnette Teigen Hoover Brunette DO [facility] Pager [phone]  If 7PM-7AM, please contact night-coverage [email or web address] Password [email or web address]  02/24/2018, 2:45 AM

## 2018-02-24 NOTE — ED Notes (Signed)
Daughter (April) can be contacted at [phone] when mother is assigned a room.

## 2018-02-24 NOTE — Progress Notes (Signed)
PROGRESS NOTE  [name] ZOX:[id number] DOB: 28-Dec-1945 DOA: 02/23/2018 PCP: Raliegh Ip, DO  Brief History:  72 year old female with a history of dementia, CKD stage III, PE on rivaroxaban, COPD, hypertension, hyperlipidemia, coronary artery disease presenting with worsening confusion over 8 to 9-day..  Her confusion began on the morning of 02/16/2018 with visual and auditory hallucinations.  Her daughter took her to see her primary care physician.  The patient was diagnosed with a UTI and started on cephalexin.  Once the cultures returned, the patient was switched to ciprofloxacin.  Unfortunately, the patient continued to have confusion.  During the next several days, the patient has had increasing generalized weakness to the point of needing assistance to get out of bed and ambulate.  As result, the patient was brought to the hospital for further evaluation.  The patient has not been started any new medications aside from the antibiotics discussed above.  She has not had any recent sick contacts.  Patient has complained of some lower abdominal pain without any headaches, chest pain, shortness breath, vomiting, diarrhea.  In the emergency department, the patient was afebrile hemodynamically stable saturating 95-99% on room air.  Urinalysis was negative for pyuria.  BMP and CBC were essentially unremarkable.  Lactic acid peaked at 3.0.  Assessment/Plan: Acute metabolic encephalopathy -Likely multifactorial including dehydration, medication induced, and likely progression of the patient's underlying dementia -At baseline, the patient is able to perform basic ADLs and feed herself.  She is usually ANO x2 -Presently, patient is pleasantly confused and O x1. -Although the patient has acute on chronic renal failure, she was at her baseline renal function on 02/17/2018 at which time she was already confused -Serum B12 -Chest x-ray -Folic acid -TSH 2.753 -Valproic acid level  35 -Urinalysis negative for pyuria -CT brain neg -Discontinue gabapentin, Ditropan  Acute on chronic renal failure--CKD stage III -Holding losartan and Lasix -Continue IV fluids -Check CPK  Diabetes mellitus type 2 -Check hemoglobin A1c -Holding metformin -NovoLog sliding scale  Hypothyroidism -Continue Synthroid -TSH 2.753  Lactic acidosis -Blood cultures obtained -Likely due to volume depletion -Patient is afebrile hemodynamically stable  Dementia with behavioral disturbance -Haldol as needed agitation -Continue Aricept and Depakote  Essential hypertension -Continue metoprolol succinate -Holding losartan secondary to acute on chronic renal failure  History of pulmonary embolism -Continue rivaroxaban    Disposition Plan:   Home in 1-2 days  Family Communication:  Daughter updated 02/24/18--Total time spent 35 minutes.  Greater than 50% spent face to face counseling and coordinating care. 0750 to 0825   Consultants:  none Code Status:  FULL   DVT Prophylaxis: Xarelto   Procedures: As Listed in Progress Note Above  Antibiotics: None    Subjective: Patient is pleasantly confused.  She denies any headache, chest pain, shortness breath, nausea, vomiting, abdominal pain.   Remainder review of systems unobtainable secondary to patient's confusion.  Objective: Vitals:   02/23/18 2252 02/23/18 2255 02/24/18 0014 02/24/18 0758  BP:  115/60  131/84  Pulse: 63   78  Resp: 18   16  Temp: 98.5 F (36.9 C)  98.6 F (37 C) 98.1 F (36.7 C)  TempSrc: Oral  Rectal Oral  SpO2: 93%   100%    Intake/Output Summary (Last 24 hours) at 02/24/2018 0816 Last data filed at 02/24/2018 0253 Gross per 24 hour  Intake 2600 ml  Output -  Net 2600 ml   Weight change:  Exam:   General:  Pt is alert, follows commands appropriately, not in acute distress  HEENT: No icterus, No thrush, No neck mass, [place]/AT  Cardiovascular: RRR, S1/S2, no rubs, no gallops  Respiratory:  Bibasilar crackles but no wheezing.  Abdomen.  Abdomen: Soft/+BS, non tender, non distended, no guarding  Extremities: No edema, No lymphangitis, No petechiae, No rashes, no synovitis  Neuro:  CN II-XII intact, strength 4/5 in RUE, RLE, strength 4/5 LUE, LLE; sensation intact bilateral; no dysmetria; babinski equivocal     Data Reviewed: I have personally reviewed following labs and imaging studies Basic Metabolic Panel: Recent Labs  Lab 02/17/18 1556 02/23/18 2347 02/24/18 0435  NA 141 139 142  K 4.8 4.1 4.1  CL 96 98 107  CO2 29 29 27   GLUCOSE 129* 103* 79  BUN 27 22 21   CREATININE 1.26* 1.75* 1.68*  CALCIUM 9.7 9.3 8.2*   Liver Function Tests: Recent Labs  Lab 02/23/18 2347  AST 18  ALT 17  ALKPHOS 52  BILITOT 0.6  PROT 7.3  ALBUMIN 3.5   No results for input(s): LIPASE, AMYLASE in the last 168 hours. No results for input(s): AMMONIA in the last 168 hours. Coagulation Profile: No results for input(s): INR, PROTIME in the last 168 hours. CBC: Recent Labs  Lab 02/17/18 1556 02/23/18 2347  WBC 8.3 6.3  NEUTROABS 5.4 2.8  HGB 12.8 12.9  HCT 38.6 39.4  MCV 95 95.9  PLT 232 232   Cardiac Enzymes: No results for input(s): CKTOTAL, CKMB, CKMBINDEX, TROPONINI in the last 168 hours. BNP: Invalid input(s): POCBNP CBG: Recent Labs  Lab 02/24/18 0755  GLUCAP 157*   HbA1C: No results for input(s): HGBA1C in the last 72 hours. Urine analysis:    Component Value Date/Time   COLORURINE YELLOW 02/24/2018 0020   APPEARANCEUR CLEAR 02/24/2018 0020   APPEARANCEUR Hazy (A) 02/17/2018 1525   LABSPEC 1.013 02/24/2018 0020   PHURINE 5.0 02/24/2018 0020   GLUCOSEU NEGATIVE 02/24/2018 0020   HGBUR NEGATIVE 02/24/2018 0020   BILIRUBINUR NEGATIVE 02/24/2018 0020   BILIRUBINUR Negative 02/17/2018 1525   KETONESUR NEGATIVE 02/24/2018 0020   PROTEINUR NEGATIVE 02/24/2018 0020   UROBILINOGEN 2.0 04/04/2015 1453   UROBILINOGEN 1.0 02/19/2015 2000   NITRITE NEGATIVE  02/24/2018 0020   LEUKOCYTESUR NEGATIVE 02/24/2018 0020   LEUKOCYTESUR 1+ (A) 02/17/2018 1525   Sepsis Labs: @LABRCNTIP (procalcitonin:4,lacticidven:4) ) Recent Results (from the past 240 hour(s))  Microscopic Examination     Status: Abnormal   Collection Time: 02/17/18  3:25 PM  Result Value Ref Range Status   WBC, UA 11-30 (A) 0 - 5 /hpf Final   RBC, UA 3-10 (A) 0 - 2 /hpf Final   Epithelial Cells (non renal) 0-10 0 - 10 /hpf Final   Renal Epithel, UA 0-10 (A) None seen /hpf Final   Bacteria, UA Many (A) None seen/Few Final  Urine Culture     Status: Abnormal   Collection Time: 02/17/18  3:57 PM  Result Value Ref Range Status   Urine Culture, Routine Final report (A)  Final   Organism ID, Bacteria Escherichia coli (A)  Final    Comment: Greater than 100,000 colony forming units per mL   Antimicrobial Susceptibility Comment  Final    Comment:       ** S = Susceptible; I = Intermediate; R = Resistant **                    P = Positive; N =  Negative             MICS are expressed in micrograms per mL    Antibiotic                 RSLT#1    RSLT#2    RSLT#3    RSLT#4 Amoxicillin/Clavulanic Acid    I Ampicillin                     R Cefazolin                      R Cefepime                       S Ceftriaxone                    S Cefuroxime                     I Ciprofloxacin                  S Ertapenem                      S Gentamicin                     S Imipenem                       S Levofloxacin                   S Meropenem                      S Nitrofurantoin                 S Piperacillin/Tazobactam        S Tetracycline                   S Tobramycin                     S Trimethoprim/Sulfa             S   Culture, blood (routine x 2)     Status: None (Preliminary result)   Collection Time: 02/23/18 11:47 PM  Result Value Ref Range Status   Specimen Description BLOOD RIGHT HAND  Final   Special Requests   Final    BOTTLES DRAWN AEROBIC AND ANAEROBIC Blood  Culture adequate volume   Culture   Final    NO GROWTH < 12 HOURS Performed at [facility], [address], [place], [place] [address]    Report Status PENDING  Incomplete  Culture, blood (routine x 2)     Status: None (Preliminary result)   Collection Time: 02/23/18 11:56 PM  Result Value Ref Range Status   Specimen Description BLOOD LEFT HAND  Final   Special Requests   Final    BOTTLES DRAWN AEROBIC AND ANAEROBIC Blood Culture results may not be optimal due to an inadequate volume of blood received in culture bottles   Culture   Final    NO GROWTH < 12 HOURS Performed at [facility], [address] [place], [place] [address]    Report Status PENDING  Incomplete     Scheduled Meds: . bisacodyl  10 mg Rectal Once  . donepezil  10 mg Oral BID  .  Fish Oil  1 capsule Oral [id number]  . insulin aspart  0-5 Units Subcutaneous QHS  . insulin aspart  0-9 Units Subcutaneous TID WC  . levothyroxine  88 mcg Oral Q breakfast  . metoprolol succinate  12.5 mg Oral q morning - 10a  . oxybutynin  5 mg Oral TID  . pantoprazole  20 mg Oral BID  . rivaroxaban  10 mg Oral Daily  . simvastatin  40 mg Oral QHS  . Vitamin D3  5,000 Units Oral Daily   Continuous Infusions: . lactated ringers 75 mL/hr at 02/24/18 1610    Procedures/Studies: Ct Head Wo Contrast  Result Date: 02/24/2018 CLINICAL DATA:  Worsening dimension altered level of consciousness. EXAM: CT HEAD WITHOUT CONTRAST TECHNIQUE: Contiguous axial images were obtained from the base of the skull through the vertex without intravenous contrast. COMPARISON:  08/01/2016 FINDINGS: Brain: Moderate degree of small vessel ischemia with sulcal and ventricular prominence consistent with superficial and central atrophy, intervally increased in appearance since prior. Midline fourth ventricle and basal cisterns without effacement. No large vascular territory infarction, hemorrhage or midline shift. No intra-axial mass nor extra-axial fluid collections.  Basal ganglial calcifications are identified bilaterally. The brainstem and cerebellum are intact without acute appearing abnormality. Vascular: No hyperdense vessel sign. Skull: No acute osseous abnormality or worrisome osseous lesions. No significant calvarial soft tissue swelling. Sinuses/Orbits: Clear paranasal sinuses and mastoids. Intact orbits and globes. Other: None IMPRESSION: There appears to have been some interval progression in cerebral atrophy and chronic appearing small vessel ischemic disease. No acute intracranial abnormality identified. Electronically Signed   By: Tollie Eth M.D.   On: 02/24/2018 00:47    Catarina Hartshorn, DO  [facility] Pager [phone]  If 7PM-7AM, please contact night-coverage [email or web address] Password [email or web address] 02/24/2018, 8:16 AM   LOS: 0 days

## 2018-02-24 NOTE — ED Provider Notes (Signed)
[facility] EMERGENCY DEPARTMENT Provider Note   CSN: [id number] Arrival date & time: 02/23/18  2232  Time seen 23:15 PM    History   Chief Complaint Chief Complaint  Patient presents with  . Urinary Retention   Level 5 caveat for dementia  HPI [name] is a 72 y.o. female.  HPI patient presents the emergency department with her daughter who is her caregiver and who the patient lives with.  She states a week ago today patient was diagnosed with a UTI and was started on Keflex.  It was changed to Cipro on September 12.  She reports her mother is not improving.  She has decreased appetite now and complains of nausea but has not had vomiting.  Usually she can walk without assistance however now her daughter has to help her walk.  She has not had known fever but daughter has been given her Tylenol for pain.  She denies any falls where she hits her head but states sometimes she tries to sit on the couch and misses and sits on the floor by accident.  She has not been coughing.  She received Rocephin at the doctor's office 1 week ago.  Daughter states she is not eating or drinking well and has not had any urine output since about 11 AM this morning.  She also has been having visual and auditory hallucinations, she has been talking to people who have been dead for 20 years.  PCP Raliegh Ip, DO [facility]   Past Medical History:  Diagnosis Date  . Anemia   . Anxiety   . Anxiety and depression, ( ? early dementia) 08/14/2011  . Arthritis   . Asthma   . Blood transfusion 2007   History of  . Chronic kidney disease    lt kidney limited fxn  . Chronic pain   . COPD (chronic obstructive pulmonary disease) with emphysema (HCC)   . DDD (degenerative disc disease)   . Dementia   . Depression   . Diabetes mellitus   . Gastroesophageal reflux disease   . Headache(784.0)   . Heart murmur    No significant valvular lesions on echo  . Heart palpitations   . History  of CHF (congestive heart failure) 2007   2013: Normal EF by echo x2 with no significant diastolic dysfunction noted either  . Hyperlipidemia   . Hypertension   . Hypothyroidism (acquired)    On Levothyroxine  . Myocardial infarction (HCC)   . Osteoporosis   . PE (pulmonary embolism) February 2007; March 2013   Bilateral PEs in 07; massive PE March 2013 with moderate elevation in PA pressures.  . Seizures (HCC)    once  . Uterine prolapse     Patient Active Problem List   Diagnosis Date Noted  . Acute metabolic encephalopathy 02/24/2018  . Lactic acidosis 02/24/2018  . Encephalopathy acute 02/24/2018  . Dementia without behavioral disturbance 11/13/2016  . Fracture of right ankle, lateral malleolus 09/01/2015  . Fracture of fibula, right, closed 08/15/2015  . Long term current use of anticoagulant therapy 07/27/2015  . CKD stage 3 due to type 2 diabetes mellitus (HCC) 07/27/2015  . Healthcare maintenance 06/30/2015  . Decreased hearing 06/30/2015  . Altered mental status 05/23/2015  . AKI (acute kidney injury) (HCC) 05/23/2015  . Hypothyroidism 05/23/2015  . OAB (overactive bladder) 04/04/2015  . Hematemesis 02/28/2015  . Gastroesophageal reflux disease without esophagitis 07/29/2014  . Hypokalemia 07/29/2014  . Type 2 diabetes with  nephropathy (HCC) 04/21/2014  . Obesity (BMI 30-39.9) 03/25/2013  . Chronic anticoagulation, pt will need lifelong anticoagulation 08/20/2011  . Pulmonary HTN on CTA this admission 08/20/2011  . Anxiety and depression, ( ? early dementia) 08/14/2011  . Abnormal chest x-ray 12/10/2010  . Palpitations 12/10/2010  . Hyperlipidemia with target LDL less than 100 11/23/2008  . Essential hypertension, benign 11/23/2008  . CHEST PAIN, PRECORDIAL,  11/23/2008  . Anemia 11/19/2008  . Hx pulmonary embolism 11/19/2008  . COPD mixed type (HCC) 11/19/2008    Past Surgical History:  Procedure Laterality Date  . CARDIAC CATHETERIZATION  March 2013   No  evidence of coronary disease; also no significant aortic or iliac or renal artery disease.  . Hiatel Hernia Repair    . LEFT HEART CATHETERIZATION WITH CORONARY ANGIOGRAM N/A 08/16/2011   Procedure: LEFT HEART CATHETERIZATION WITH CORONARY ANGIOGRAM;  Surgeon: Marykay Lex, MD;  Location: [facility] CATH LAB;  Service: Cardiovascular;  Laterality: N/A;  . TUBAL LIGATION       OB History   None      Home Medications    Prior to Admission medications   Medication Sig Start Date End Date Taking? Authorizing Provider  Cholecalciferol (VITAMIN D3) 5000 UNITS CAPS Take 5,000 Units by mouth daily.   Yes [provider]  divalproex (DEPAKOTE ER) 250 MG 24 hr tablet Take 1 tab at dinner, 2 tabs at bedtime Patient taking differently: Take 250-500 mg by mouth See admin instructions. Take 1 tab at dinner, 2 tabs at bedtime 07/07/17  Yes Van Clines, MD  donepezil (ARICEPT) 10 MG tablet Take 1 tablet (10 mg total) by mouth 2 (two) times daily. 01/21/17  Yes Elenora Gamma, MD  DULoxetine (CYMBALTA) 30 MG capsule TAKE ONE CAPSULE BY MOUTH EVERY DAY Patient taking differently: Take 30 mg by mouth daily.  01/14/18  Yes Johna Sheriff, MD  furosemide (LASIX) 40 MG tablet TAKE 1/2 TABLET BY MOUTH EVERY DAY Patient taking differently: Take 20 mg by mouth daily.  12/12/17  Yes Dettinger, Elige Radon, MD  gabapentin (NEURONTIN) 300 MG capsule TAKE ONE CAPSULE BY MOUTH TWICE DAILY Patient taking differently: Take 300 mg by mouth 2 (two) times daily.  01/16/18  Yes Hawks, Christy A, FNP  glimepiride (AMARYL) 2 MG tablet TAKE 1 TABLET BY MOUTH EVERY DAY BEFORE BREAKFAST Patient taking differently: Take 2 mg by mouth daily with breakfast.  01/14/18  Yes Johna Sheriff, MD  levothyroxine (SYNTHROID, LEVOTHROID) 88 MCG tablet TAKE ONE TABLET BY MOUTH EVERY MORNING BEFORE BREAKFAST Patient taking differently: Take 88 mcg by mouth daily before breakfast.  08/18/17  Yes Elenora Gamma, MD  losartan (COZAAR) 25  MG tablet TAKE 1 TABLET BY MOUTH EVERY DAY Patient taking differently: Take 25 mg by mouth daily.  12/12/17  Yes Dettinger, Elige Radon, MD  meclizine (ANTIVERT) 50 MG tablet Take 50 mg by mouth daily. Reported on 09/27/2015   Yes [provider]  metFORMIN (GLUCOPHAGE) 500 MG tablet TAKE 1 TABLET BY MOUTH TWICE DAILY WITH A MEAL Patient taking differently: Take 500 mg by mouth 2 (two) times daily with a meal.  01/16/18  Yes Hawks, Christy A, FNP  metoprolol succinate (TOPROL-XL) 25 MG 24 hr tablet TAKE 1/2 TABLET BY MOUTH EVERY DAY Patient taking differently: Take 12.5 mg by mouth every morning.  09/16/17  Yes Elenora Gamma, MD  nystatin (NYSTATIN) powder Apply topically 4 (four) times daily. 12/05/17  Yes Elenora Gamma, MD  Omega-3 Fatty Acids (FISH OIL) 1000 MG CAPS Take 1 capsule by mouth every morning.   Yes [provider]  oxybutynin (DITROPAN) 5 MG tablet TAKE 1 TABLET BY MOUTH THREE TIMES DAILY Patient taking differently: Take 5 mg by mouth 3 (three) times daily.  02/12/18  Yes Gottschalk, Ashly M, DO  pantoprazole (PROTONIX) 20 MG tablet TAKE 1 TABLET BY MOUTH TWICE DAILY Patient taking differently: Take 20 mg by mouth 2 (two) times daily.  12/12/17  Yes Dettinger, Elige Radon, MD  potassium chloride SA (K-DUR,KLOR-CON) 20 MEQ tablet TAKE 1 TABLET BY MOUTH EVERY MORNING Patient taking differently: Take 20 mEq by mouth every morning.  12/12/17  Yes Dettinger, Elige Radon, MD  QUEtiapine (SEROQUEL) 200 MG tablet TAKE 1 TABLET BY MOUTH AT BEDTIME Patient taking differently: Take 200 mg by mouth at bedtime.  12/12/17  Yes Dettinger, Elige Radon, MD  simvastatin (ZOCOR) 40 MG tablet TAKE 1 TABLET BY MOUTH AT BEDTIME Patient taking differently: Take 40 mg by mouth at bedtime.  01/16/18  Yes Hawks, Christy A, FNP  sulfamethoxazole-trimethoprim (BACTRIM DS) 800-160 MG tablet Take 1 tablet by mouth 2 (two) times daily for 7 days. 02/20/18 02/27/18 Yes Gottschalk, Ashly M, DO  XARELTO 10 MG TABS  tablet TAKE 1 TABLET BY MOUTH DAILY Patient taking differently: Take 10 mg by mouth daily.  01/16/18  Yes Hawks, Christy A, FNP  ACCU-CHEK SOFTCLIX LANCETS lancets USE TO CHECK BLOOD SUGAR UP TO TWICE DAILY . DX: E11.9 - TYPE 2 DIABETES MELLITUS 07/26/16   Elenora Gamma, MD  glucose blood (TRUE METRIX BLOOD GLUCOSE TEST) test strip USE TO CHECK BLOOD GLUCOSE UP TO TWICE DAILY 12/26/17   Elenora Gamma, MD    Family History Family History  Problem Relation Age of Onset  . Heart disease Mother   . Stroke Mother   . Dementia Mother   . Prostate cancer Father   . Cancer Father        bone  . COPD Sister   . Heart disease Sister   . Goiter Sister   . Cancer Sister        THYROID  . Early death Brother 8  . Cancer Brother        leukemia  . Liver cancer Brother   . Alzheimer's disease Unknown        family history  . Cancer Daughter        breast  . Cancer Paternal Uncle        unsure of what kind    Social History Social History   Tobacco Use  . Smoking status: Current Every Day Smoker    Packs/day: 2.00    Years: 40.00    Pack years: 80.00    Types: Cigarettes    Last attempt to quit: 03/23/2001    Years since quitting: 16.9  . Smokeless tobacco: Never Used  . Tobacco comment: EXPOSED TO PASSIVE SMOKE  Substance Use Topics  . Alcohol use: No  . Drug use: No  lives at home Lives with daughter   Allergies   Procaine hcl; Codeine; Latex; and Penicillins   Review of Systems Review of Systems  Unable to perform ROS: Dementia     Physical Exam Updated Vital Signs BP 115/60 (BP Location: Right Arm)   Pulse 63   Temp 98.5 F (36.9 C) (Oral)   Resp 18   SpO2 93%   Vital signs normal    Physical Exam  Constitutional: She appears well-developed  and well-nourished.  Non-toxic appearance. She does not appear ill. No distress.  Patient appears to be sleeping, she does not verbally interact with me or follow directions  HENT:  Head: Normocephalic and  atraumatic.  Right Ear: External ear normal.  Left Ear: External ear normal.  Nose: Nose normal. No mucosal edema or rhinorrhea.  Mouth/Throat: Mucous membranes are normal. No dental abscesses or uvula swelling.  Patient opens her mouth minimally and her tongue looks dry  Eyes: Pupils are equal, round, and reactive to light. Conjunctivae and EOM are normal.  I have to open the patient's eyelids to see her eyes  Neck: Normal range of motion and full passive range of motion without pain. Neck supple.  Cardiovascular: Normal rate, regular rhythm and normal heart sounds. Exam reveals no gallop and no friction rub.  No murmur heard. Pulmonary/Chest: Effort normal and breath sounds normal. No respiratory distress. She has no wheezes. She has no rhonchi. She has no rales. She exhibits no tenderness and no crepitus.  Abdominal: Soft. Normal appearance and bowel sounds are normal. She exhibits no distension. There is no tenderness. There is no rebound and no guarding.  Musculoskeletal: Normal range of motion. She exhibits no edema or tenderness.  Moves all extremities well.   Neurological: She has normal strength.  Unable to cooperate  Skin: Skin is warm, dry and intact. No rash noted. No erythema. No pallor.  Psychiatric: She has a normal mood and affect. Her speech is normal and behavior is normal. Her mood appears not anxious.  Nursing note and vitals reviewed.    ED Treatments / Results  Labs (all labs ordered are listed, but only abnormal results are displayed) Results for orders placed or performed during the hospital encounter of 02/23/18  Culture, blood (routine x 2)  Result Value Ref Range   Specimen Description BLOOD LEFT HAND    Special Requests      BOTTLES DRAWN AEROBIC AND ANAEROBIC Blood Culture results may not be optimal due to an inadequate volume of blood received in culture bottles Performed at [facility], [address], [place], [place] [address]    Culture PENDING     Report Status PENDING   Comprehensive metabolic panel  Result Value Ref Range   Sodium 139 135 - 145 mmol/L   Potassium 4.1 3.5 - 5.1 mmol/L   Chloride 98 98 - 111 mmol/L   CO2 29 22 - 32 mmol/L   Glucose, Bld 103 (H) 70 - 99 mg/dL   BUN 22 8 - 23 mg/dL   Creatinine, Ser 6.04 (H) 0.44 - 1.00 mg/dL   Calcium 9.3 8.9 - 54.0 mg/dL   Total Protein 7.3 6.5 - 8.1 g/dL   Albumin 3.5 3.5 - 5.0 g/dL   AST 18 15 - 41 U/L   ALT 17 0 - 44 U/L   Alkaline Phosphatase 52 38 - 126 U/L   Total Bilirubin 0.6 0.3 - 1.2 mg/dL   GFR calc non Af Amer 28 (L) >60 mL/min   GFR calc Af Amer 33 (L) >60 mL/min   Anion gap 12 5 - 15  CBC with Differential  Result Value Ref Range   WBC 6.3 4.0 - 10.5 K/uL   RBC 4.11 3.87 - 5.11 MIL/uL   Hemoglobin 12.9 12.0 - 15.0 g/dL   HCT 98.1 19.1 - 47.8 %   MCV 95.9 78.0 - 100.0 fL   MCH 31.4 26.0 - 34.0 pg   MCHC 32.7 30.0 - 36.0 g/dL  RDW 13.2 11.5 - 15.5 %   Platelets 232 150 - 400 K/uL   Neutrophils Relative % 44 %   Neutro Abs 2.8 1.7 - 7.7 K/uL   Lymphocytes Relative 44 %   Lymphs Abs 2.8 0.7 - 4.0 K/uL   Monocytes Relative 9 %   Monocytes Absolute 0.6 0.1 - 1.0 K/uL   Eosinophils Relative 2 %   Eosinophils Absolute 0.1 0.0 - 0.7 K/uL   Basophils Relative 1 %   Basophils Absolute 0.0 0.0 - 0.1 K/uL  Lactic acid, plasma  Result Value Ref Range   Lactic Acid, Venous 3.0 (HH) 0.5 - 1.9 mmol/L  Lactic acid, plasma  Result Value Ref Range   Lactic Acid, Venous 2.4 (HH) 0.5 - 1.9 mmol/L  Urinalysis, Routine w reflex microscopic  Result Value Ref Range   Color, Urine YELLOW YELLOW   APPearance CLEAR CLEAR   Specific Gravity, Urine 1.013 1.005 - 1.030   pH 5.0 5.0 - 8.0   Glucose, UA NEGATIVE NEGATIVE mg/dL   Hgb urine dipstick NEGATIVE NEGATIVE   Bilirubin Urine NEGATIVE NEGATIVE   Ketones, ur NEGATIVE NEGATIVE mg/dL   Protein, ur NEGATIVE NEGATIVE mg/dL   Nitrite NEGATIVE NEGATIVE   Leukocytes, UA NEGATIVE NEGATIVE  Valproic acid level  Result  Value Ref Range   Valproic Acid Lvl 35 (L) 50.0 - 100.0 ug/mL   Laboratory interpretation all normal except normal urine, subtherapeutic valproic acid level, positive lactic acidosis that improved with IV fluids, some worsening of her chronic renal insufficiency    EKG None  Radiology Ct Head Wo Contrast  Result Date: 02/24/2018 CLINICAL DATA:  Worsening dimension altered level of consciousness. EXAM: CT HEAD WITHOUT CONTRAST TECHNIQUE: Contiguous axial images were obtained from the base of the skull through the vertex without intravenous contrast. COMPARISON:  08/01/2016 FINDINGS: Brain: Moderate degree of small vessel ischemia with sulcal and ventricular prominence consistent with superficial and central atrophy, intervally increased in appearance since prior. Midline fourth ventricle and basal cisterns without effacement. No large vascular territory infarction, hemorrhage or midline shift. No intra-axial mass nor extra-axial fluid collections. Basal ganglial calcifications are identified bilaterally. The brainstem and cerebellum are intact without acute appearing abnormality. Vascular: No hyperdense vessel sign. Skull: No acute osseous abnormality or worrisome osseous lesions. No significant calvarial soft tissue swelling. Sinuses/Orbits: Clear paranasal sinuses and mastoids. Intact orbits and globes. Other: None IMPRESSION: There appears to have been some interval progression in cerebral atrophy and chronic appearing small vessel ischemic disease. No acute intracranial abnormality identified. Electronically Signed   By: Tollie Ethavid  Kwon M.D.   On: 02/24/2018 00:47    Procedures Procedures (including critical care time)  February 17, 2018  Urine Culture  7d ago  Urine Culture, Routine Final reportAbnormal    Organism ID, Bacteria Escherichia coliAbnormal    Comment: Greater than 100,000 colony forming units per mL  Antimicrobial Susceptibility Comment   Comment:   ** S = Susceptible; I =  Intermediate; R = Resistant **           P = Positive; N = Negative        MICS are expressed in micrograms per mL   Antibiotic         RSLT#1  RSLT#2  RSLT#3  RSLT#4  Amoxicillin/Clavulanic Acid  I  Ampicillin           R  Cefazolin           R  Cefepime  S  Ceftriaxone          S  Cefuroxime           I  Ciprofloxacin         S  Ertapenem           S  Gentamicin           S  Imipenem            S  Levofloxacin          S  Meropenem           S  Nitrofurantoin         S  Piperacillin/Tazobactam    S  Tetracycline          S  Tobramycin           S  Trimethoprim/Sulfa       S      Medications Ordered in ED Medications  sodium chloride 0.9 % bolus 1,000 mL (0 mLs Intravenous Stopped 02/24/18 0112)  sodium chloride 0.9 % bolus 500 mL (0 mLs Intravenous Stopped 02/24/18 0148)  cefTRIAXone (ROCEPHIN) 1 g in sodium chloride 0.9 % 100 mL IVPB (0 g Intravenous Stopped 02/24/18 0045)  sodium chloride 0.9 % bolus 1,000 mL (0 mLs Intravenous Stopped 02/24/18 0253)     Initial Impression / Assessment and Plan / ED Course  I have reviewed the triage vital signs and the nursing notes.  Pertinent labs & imaging results that were available during my care of the patient were reviewed by me and considered in my medical decision making (see chart for details).    Bladder scan was 75 cc so patient is dehydrated.  Pt was given IV fluids for dehydration.   Patient was given a total of 2500 cc IV fluids.  I gave her IV Rocephin while waiting for her test results since it was most likely she had a UTI.  CT scan of the head was done because patient is on a blood thinner and she has some altered mental status.  Review of her test shows she does have some mild worsening of her chronic baseline renal insufficiency  consistent with dehydration.  Her bladder scan also showed decreased urine in her bladder consistent with dehydration.  She has been given IV fluids.  Patient remains somnolent during her ED visit.  I talked to the daughter that her urine was normal so it is not clear why she is having the mental status change.  I will talk to the hospitalist about admitting.  1:30 AM Dr. Sherryll Burger, hospitalist will admit   Final Clinical Impressions(s) / ED Diagnoses   Final diagnoses:  Dehydration  Somnolence  Hallucinations    Plan admission  Devoria Albe, MD, Concha Pyo, MD 02/24/18 [phone]

## 2018-02-24 NOTE — ED Notes (Signed)
Date and time results received: 02/24/18 12:32 AM    Test: Lactic Acid Critical Value: 3.0  Name of Provider Notified: Dr Lynelle DoctorKnapp  Orders Received? Or Actions Taken?: MD notified

## 2018-02-25 ENCOUNTER — Inpatient Hospital Stay (HOSPITAL_COMMUNITY): Payer: Medicare HMO

## 2018-02-25 DIAGNOSIS — F0391 Unspecified dementia with behavioral disturbance: Secondary | ICD-10-CM

## 2018-02-25 DIAGNOSIS — E872 Acidosis: Secondary | ICD-10-CM

## 2018-02-25 DIAGNOSIS — E039 Hypothyroidism, unspecified: Secondary | ICD-10-CM

## 2018-02-25 DIAGNOSIS — K219 Gastro-esophageal reflux disease without esophagitis: Secondary | ICD-10-CM

## 2018-02-25 DIAGNOSIS — I10 Essential (primary) hypertension: Secondary | ICD-10-CM

## 2018-02-25 DIAGNOSIS — N183 Chronic kidney disease, stage 3 (moderate): Secondary | ICD-10-CM

## 2018-02-25 DIAGNOSIS — E860 Dehydration: Secondary | ICD-10-CM

## 2018-02-25 DIAGNOSIS — Z7901 Long term (current) use of anticoagulants: Secondary | ICD-10-CM

## 2018-02-25 DIAGNOSIS — J449 Chronic obstructive pulmonary disease, unspecified: Secondary | ICD-10-CM

## 2018-02-25 DIAGNOSIS — G9341 Metabolic encephalopathy: Secondary | ICD-10-CM

## 2018-02-25 DIAGNOSIS — N179 Acute kidney failure, unspecified: Principal | ICD-10-CM

## 2018-02-25 LAB — BASIC METABOLIC PANEL
Anion gap: 4 — ABNORMAL LOW (ref 5–15)
BUN: 20 mg/dL (ref 8–23)
CHLORIDE: 106 mmol/L (ref 98–111)
CO2: 31 mmol/L (ref 22–32)
Calcium: 9 mg/dL (ref 8.9–10.3)
Creatinine, Ser: 1.3 mg/dL — ABNORMAL HIGH (ref 0.44–1.00)
GFR calc Af Amer: 47 mL/min — ABNORMAL LOW (ref >60)
GFR, EST NON AFRICAN AMERICAN: 41 mL/min — AB (ref >60)
Glucose, Bld: 125 mg/dL — ABNORMAL HIGH (ref 70–99)
POTASSIUM: 5 mmol/L (ref 3.5–5.1)
SODIUM: 141 mmol/L (ref 135–145)

## 2018-02-25 LAB — URINE CULTURE
Culture: NO GROWTH
Special Requests: NORMAL

## 2018-02-25 LAB — GLUCOSE, CAPILLARY
GLUCOSE-CAPILLARY: 134 mg/dL — AB (ref 70–99)
GLUCOSE-CAPILLARY: 129 mg/dL — AB (ref 70–99)
Glucose-Capillary: 115 mg/dL — ABNORMAL HIGH (ref 70–99)
Glucose-Capillary: 112 mg/dL — ABNORMAL HIGH (ref 70–99)

## 2018-02-25 LAB — HEMOGLOBIN A1C
HEMOGLOBIN A1C: 7.1 % — AB (ref 4.8–5.6)
MEAN PLASMA GLUCOSE: 157 mg/dL

## 2018-02-25 LAB — CBC
HCT: 35.4 % — ABNORMAL LOW (ref 36.0–46.0)
HEMOGLOBIN: 11.3 g/dL — AB (ref 12.0–15.0)
MCH: 31.3 pg (ref 26.0–34.0)
MCHC: 31.9 g/dL (ref 30.0–36.0)
MCV: 98.1 fL (ref 78.0–100.0)
PLATELETS: 201 10*3/uL (ref 150–400)
RBC: 3.61 MIL/uL — AB (ref 3.87–5.11)
RDW: 13.1 % (ref 11.5–15.5)
WBC: 5 10*3/uL (ref 4.0–10.5)

## 2018-02-25 LAB — MAGNESIUM: MAGNESIUM: 1.5 mg/dL — AB (ref 1.7–2.4)

## 2018-02-25 LAB — HIV ANTIBODY (ROUTINE TESTING W REFLEX): HIV SCREEN 4TH GENERATION: NONREACTIVE

## 2018-02-25 MED ORDER — MAGNESIUM SULFATE 2 GM/50ML IV SOLN
2.0000 g | Freq: Once | INTRAVENOUS | Status: AC
  Administered 2018-02-25: 2 g via INTRAVENOUS
  Filled 2018-02-25: qty 50

## 2018-02-25 MED ORDER — LACTATED RINGERS IV SOLN
INTRAVENOUS | Status: DC
  Administered 2018-02-25 – 2018-02-27 (×5): via INTRAVENOUS

## 2018-02-25 NOTE — Progress Notes (Signed)
Patient vomiting, MD informed. Giving PRN zofran at this time.

## 2018-02-25 NOTE — Care Management Note (Signed)
Case Management Note  Patient Details  Name: [name] MRN: [id number] Date of Birth: 09-Apr-1946  Subjective/Objective:    Acute metabolic encephalopathy. From home with family. Lots of family at bedside.  Daughter lives with her and is her CAP aide M-F 8 hours/day, Sat and Sun 5 hours/day. Daughter reports patient has RW and cane, has not been needing. Has PCP, transportation, and insurance with medication coverage.                 Action/Plan: DC home in care of family.   Expected Discharge Date:      02/25/2018            Expected Discharge Plan:  Home/Self Care  In-House Referral:     Discharge planning Services  CM Consult  Post Acute Care Choice:  NA Choice offered to:  NA  DME Arranged:    DME Agency:     HH Arranged:    HH Agency:     Status of Service:  Completed, signed off  If discussed at [employer] Length [employer] Stay Meetings, dates discussed:    Additional Comments:  [name], Monica OilerSharley Diane, RN 02/25/2018, 11:48 AM

## 2018-02-25 NOTE — Progress Notes (Signed)
PROGRESS NOTE  [name]:[id number] DOB: Nov 29, 1945 DOA: 02/23/2018 PCP: Raliegh Ip, DO  Brief History:  72 year old female with a history of dementia, CKD stage III, PE on rivaroxaban, COPD, hypertension, hyperlipidemia, coronary artery disease presenting with worsening confusion over 8 to 9-day..  Her confusion began on the morning of 02/16/2018 with visual and auditory hallucinations.  Her daughter took her to see her primary care physician.  The patient was diagnosed with a UTI and started on cephalexin.  Once the cultures returned, the patient was switched to ciprofloxacin.  Unfortunately, the patient continued to have confusion.  During the next several days, the patient has had increasing generalized weakness to the point of needing assistance to get out of bed and ambulate.  As result, the patient was brought to the hospital for further evaluation.  The patient has not been started any new medications aside from the antibiotics discussed above.  She has not had any recent sick contacts.  Patient has complained of some lower abdominal pain without any headaches, chest pain, shortness breath, vomiting, diarrhea.  In the emergency department, the patient was afebrile hemodynamically stable saturating 95-99% on room air.  Urinalysis was negative for pyuria.  BMP and CBC were essentially unremarkable.  Lactic acid peaked at 3.0.  Assessment/Plan: Acute metabolic encephalopathy -Likely multifactorial including dehydration, medication induced, and likely progression of the patient's underlying dementia -At baseline, the patient is able to perform basic ADLs and feed herself.  She is usually ANO x2 -Presently, patient is pleasantly confused and O x1. -Although the patient has acute on chronic renal failure, she was at her baseline renal function on 02/17/2018 at which time she was already confused -Serum B12 393 -Folic acid 6.0 -TSH 2.753 -Valproic acid level  35 -Urinalysis negative for pyuria -CT brain neg -Discontinue gabapentin, Ditropan  Acute on chronic renal failure--CKD stage III -Holding losartan and Lasix -Continue IV fluids -CK normal  Diabetes mellitus type 2 -hemoglobin A1c 7.1 -Holding metformin -NovoLog sliding scale  Hypothyroidism -Continue Synthroid -TSH 2.753  Lactic acidosis -Blood cultures obtained -Likely due to volume depletion -Patient is afebrile hemodynamically stable  Dementia with behavioral disturbance -Haldol as needed agitation -Continue Aricept and Depakote  Essential hypertension -Continue metoprolol succinate -Holding losartan secondary to acute on chronic renal failure  History of pulmonary embolism -Continue rivaroxaban  Vomiting. -continue antiemetics -check plain films of abdomen    Disposition Plan:   Home in 1-2 days  Family Communication:  Daughter updated 02/25/18  Total time spent   Consultants:  none Code Status:  FULL   DVT Prophylaxis: Xarelto   Procedures: As Listed in Progress Note Above  Antibiotics: None    Subjective: Patient seen sitting up in bed. Awake. She is confused. Later developed vomiting per staff  Objective: Vitals:   02/24/18 1357 02/24/18 2207 02/25/18 0542 02/25/18 1534  BP: 106/90 135/61 (!) 133/54 (!) 140/58  Pulse: 68 (!) 58 (!) 57 (!) 54  Resp:  18 17   Temp: 98 F (36.7 C) 98.4 F (36.9 C) (!) 97.3 F (36.3 C) 97.6 F (36.4 C)  TempSrc: Oral Oral Oral Oral  SpO2: 97% 98% 98% 99%  Weight:      Height:        Intake/Output Summary (Last 24 hours) at 02/25/2018 1934 Last data filed at 02/25/2018 0952 Gross per 24 hour  Intake 772 ml  Output 400 ml  Net 372 ml  Weight change:  Exam:  General exam: Alert, awake, no distress Respiratory system: Clear to auscultation. Respiratory effort normal. Cardiovascular system:RRR. No murmurs, rubs, gallops. Gastrointestinal system: Abdomen is nondistended, soft and  nontender. No organomegaly or masses felt. Normal bowel sounds heard. Central nervous system: No focal neurological deficits. Extremities: No C/C/E, +pedal pulses Skin: No rashes, lesions or ulcers Psychiatry: confused  Data Reviewed: I have personally reviewed following labs and imaging studies Basic Metabolic Panel: Recent Labs  Lab 02/23/18 2347 02/24/18 0435 02/25/18 0530  NA 139 142 141  K 4.1 4.1 5.0  CL 98 107 106  CO2 29 27 31   GLUCOSE 103* 79 125*  BUN 22 21 20   CREATININE 1.75* 1.68* 1.30*  CALCIUM 9.3 8.2* 9.0  MG  --   --  1.5*   Liver Function Tests: Recent Labs  Lab 02/23/18 2347  AST 18  ALT 17  ALKPHOS 52  BILITOT 0.6  PROT 7.3  ALBUMIN 3.5   No results for input(s): LIPASE, AMYLASE in the last 168 hours. Recent Labs  Lab 02/24/18 0857  AMMONIA 13   Coagulation Profile: No results for input(s): INR, PROTIME in the last 168 hours. CBC: Recent Labs  Lab 02/23/18 2347 02/25/18 0530  WBC 6.3 5.0  NEUTROABS 2.8  --   HGB 12.9 11.3*  HCT 39.4 35.4*  MCV 95.9 98.1  PLT 232 201   Cardiac Enzymes: Recent Labs  Lab 02/24/18 0857  CKTOTAL 41   BNP: Invalid input(s): POCBNP CBG: Recent Labs  Lab 02/24/18 1655 02/24/18 2233 02/25/18 0735 02/25/18 1155 02/25/18 1601  GLUCAP 159* 121* 115* 134* 129*   HbA1C: Recent Labs    02/24/18 0857  HGBA1C 7.1*   Urine analysis:    Component Value Date/Time   COLORURINE YELLOW 02/24/2018 0020   APPEARANCEUR CLEAR 02/24/2018 0020   APPEARANCEUR Hazy (A) 02/17/2018 1525   LABSPEC 1.013 02/24/2018 0020   PHURINE 5.0 02/24/2018 0020   GLUCOSEU NEGATIVE 02/24/2018 0020   HGBUR NEGATIVE 02/24/2018 0020   BILIRUBINUR NEGATIVE 02/24/2018 0020   BILIRUBINUR Negative 02/17/2018 1525   KETONESUR NEGATIVE 02/24/2018 0020   PROTEINUR NEGATIVE 02/24/2018 0020   UROBILINOGEN 2.0 04/04/2015 1453   UROBILINOGEN 1.0 02/19/2015 2000   NITRITE NEGATIVE 02/24/2018 0020   LEUKOCYTESUR NEGATIVE 02/24/2018  0020   LEUKOCYTESUR 1+ (A) 02/17/2018 1525   Sepsis Labs: @LABRCNTIP (procalcitonin:4,lacticidven:4) ) Recent Results (from the past 240 hour(s))  Microscopic Examination     Status: Abnormal   Collection Time: 02/17/18  3:25 PM  Result Value Ref Range Status   WBC, UA 11-30 (A) 0 - 5 /hpf Final   RBC, UA 3-10 (A) 0 - 2 /hpf Final   Epithelial Cells (non renal) 0-10 0 - 10 /hpf Final   Renal Epithel, UA 0-10 (A) None seen /hpf Final   Bacteria, UA Many (A) None seen/Few Final  Urine Culture     Status: Abnormal   Collection Time: 02/17/18  3:57 PM  Result Value Ref Range Status   Urine Culture, Routine Final report (A)  Final   Organism ID, Bacteria Escherichia coli (A)  Final    Comment: Greater than 100,000 colony forming units per mL   Antimicrobial Susceptibility Comment  Final    Comment:       ** S = Susceptible; I = Intermediate; R = Resistant **                    P = Positive; N = Negative  MICS are expressed in micrograms per mL    Antibiotic                 RSLT#1    RSLT#2    RSLT#3    RSLT#4 Amoxicillin/Clavulanic Acid    I Ampicillin                     R Cefazolin                      R Cefepime                       S Ceftriaxone                    S Cefuroxime                     I Ciprofloxacin                  S Ertapenem                      S Gentamicin                     S Imipenem                       S Levofloxacin                   S Meropenem                      S Nitrofurantoin                 S Piperacillin/Tazobactam        S Tetracycline                   S Tobramycin                     S Trimethoprim/Sulfa             S   Culture, blood (routine x 2)     Status: None (Preliminary result)   Collection Time: 02/23/18 11:47 PM  Result Value Ref Range Status   Specimen Description BLOOD RIGHT HAND  Final   Special Requests   Final    BOTTLES DRAWN AEROBIC AND ANAEROBIC Blood Culture adequate volume   Culture   Final    NO  GROWTH 1 DAY Performed at [facility], [address], [place], [place] [address]    Report Status PENDING  Incomplete  Culture, blood (routine x 2)     Status: None (Preliminary result)   Collection Time: 02/23/18 11:56 PM  Result Value Ref Range Status   Specimen Description BLOOD LEFT HAND  Final   Special Requests   Final    BOTTLES DRAWN AEROBIC AND ANAEROBIC Blood Culture results may not be optimal due to an inadequate volume of blood received in culture bottles   Culture   Final    NO GROWTH 1 DAY Performed at [facility], [address] [place], [place] [address]    Report Status PENDING  Incomplete  Urine culture     Status: None   Collection Time: 02/24/18 12:20 AM  Result Value Ref Range Status   Specimen Description   Final    URINE, CATHETERIZED Performed at [facility]  [facility], [address], [place], [place] [address]    Special Requests   Final    Normal Performed at [facility], [address], [place], [place] [address]    Culture   Final    NO GROWTH Performed at [facility], [address], [place], [place] [address]    Report Status 02/25/2018 FINAL  Final     Scheduled Meds: . bisacodyl  10 mg Rectal Once  . cholecalciferol  5,000 Units Oral Daily  . divalproex  250 mg Oral Daily  . divalproex  500 mg Oral QHS  . donepezil  10 mg Oral BID  . insulin aspart  0-9 Units Subcutaneous TID WC  . levothyroxine  88 mcg Oral QAC breakfast  . metoprolol succinate  12.5 mg Oral q morning - 10a  . omega-3 acid ethyl esters  1 capsule Oral BH-q7a  . pantoprazole  40 mg Oral BID  . rivaroxaban  10 mg Oral QPM  . simvastatin  40 mg Oral QHS   Continuous Infusions:   Procedures/Studies: Ct Head Wo Contrast  Result Date: 02/24/2018 CLINICAL DATA:  Worsening dimension altered level of consciousness. EXAM: CT HEAD WITHOUT CONTRAST TECHNIQUE: Contiguous axial images were obtained from the base of the skull through the vertex without intravenous contrast.  COMPARISON:  08/01/2016 FINDINGS: Brain: Moderate degree of small vessel ischemia with sulcal and ventricular prominence consistent with superficial and central atrophy, intervally increased in appearance since prior. Midline fourth ventricle and basal cisterns without effacement. No large vascular territory infarction, hemorrhage or midline shift. No intra-axial mass nor extra-axial fluid collections. Basal ganglial calcifications are identified bilaterally. The brainstem and cerebellum are intact without acute appearing abnormality. Vascular: No hyperdense vessel sign. Skull: No acute osseous abnormality or worrisome osseous lesions. No significant calvarial soft tissue swelling. Sinuses/Orbits: Clear paranasal sinuses and mastoids. Intact orbits and globes. Other: None IMPRESSION: There appears to have been some interval progression in cerebral atrophy and chronic appearing small vessel ischemic disease. No acute intracranial abnormality identified. Electronically Signed   By: Tollie Eth M.D.   On: 02/24/2018 00:47    Erick Blinks, MD  [facility] Pager [phone]  If 7PM-7AM, please contact night-coverage [email or web address] Password [email or web address] 02/25/2018, 7:34 PM   LOS: 1 day

## 2018-02-26 ENCOUNTER — Other Ambulatory Visit: Payer: Self-pay

## 2018-02-26 DIAGNOSIS — F0391 Unspecified dementia with behavioral disturbance: Secondary | ICD-10-CM

## 2018-02-26 DIAGNOSIS — F03B18 Unspecified dementia, moderate, with other behavioral disturbance: Secondary | ICD-10-CM

## 2018-02-26 LAB — COMPREHENSIVE METABOLIC PANEL
ALK PHOS: 48 U/L (ref 38–126)
ALT: 13 U/L (ref 0–44)
AST: 12 U/L — AB (ref 15–41)
Albumin: 3.1 g/dL — ABNORMAL LOW (ref 3.5–5.0)
Anion gap: 7 (ref 5–15)
BUN: 15 mg/dL (ref 8–23)
CALCIUM: 9.3 mg/dL (ref 8.9–10.3)
CHLORIDE: 104 mmol/L (ref 98–111)
CO2: 30 mmol/L (ref 22–32)
CREATININE: 1.13 mg/dL — AB (ref 0.44–1.00)
GFR calc Af Amer: 56 mL/min — ABNORMAL LOW (ref >60)
GFR calc non Af Amer: 48 mL/min — ABNORMAL LOW (ref >60)
Glucose, Bld: 125 mg/dL — ABNORMAL HIGH (ref 70–99)
Potassium: 4.7 mmol/L (ref 3.5–5.1)
SODIUM: 141 mmol/L (ref 135–145)
Total Bilirubin: 0.6 mg/dL (ref 0.3–1.2)
Total Protein: 6.5 g/dL (ref 6.5–8.1)

## 2018-02-26 LAB — MAGNESIUM: Magnesium: 1.9 mg/dL (ref 1.7–2.4)

## 2018-02-26 LAB — GLUCOSE, CAPILLARY
GLUCOSE-CAPILLARY: 155 mg/dL — AB (ref 70–99)
GLUCOSE-CAPILLARY: 145 mg/dL — AB (ref 70–99)
Glucose-Capillary: 110 mg/dL — ABNORMAL HIGH (ref 70–99)
Glucose-Capillary: 125 mg/dL — ABNORMAL HIGH (ref 70–99)
Glucose-Capillary: 98 mg/dL (ref 70–99)

## 2018-02-26 LAB — LIPASE, BLOOD: Lipase: 31 U/L (ref 11–51)

## 2018-02-26 MED ORDER — METOCLOPRAMIDE HCL 5 MG/ML IJ SOLN
5.0000 mg | Freq: Four times a day (QID) | INTRAMUSCULAR | Status: DC
  Administered 2018-02-26 – 2018-02-27 (×4): 5 mg via INTRAVENOUS
  Filled 2018-02-26 (×4): qty 2

## 2018-02-26 MED ORDER — AMLODIPINE BESYLATE 5 MG PO TABS
5.0000 mg | ORAL_TABLET | Freq: Every day | ORAL | Status: DC
  Administered 2018-02-26 – 2018-02-27 (×2): 5 mg via ORAL
  Filled 2018-02-26 (×2): qty 1

## 2018-02-26 MED ORDER — ATORVASTATIN CALCIUM 20 MG PO TABS
20.0000 mg | ORAL_TABLET | Freq: Every day | ORAL | Status: DC
  Administered 2018-02-26: 20 mg via ORAL
  Filled 2018-02-26: qty 1

## 2018-02-26 MED ORDER — DIVALPROEX SODIUM ER 250 MG PO TB24: ORAL_TABLET | ORAL | 0 refills | Status: DC

## 2018-02-26 MED ORDER — POLYETHYLENE GLYCOL 3350 17 G PO PACK
17.0000 g | PACK | Freq: Every day | ORAL | Status: DC
  Administered 2018-02-26 – 2018-02-27 (×2): 17 g via ORAL
  Filled 2018-02-26 (×2): qty 1

## 2018-02-26 NOTE — Evaluation (Signed)
Clinical/Bedside Swallow Evaluation Patient Details  Name: [name] MRN: [id number] Date of Birth: February 18, 1946  Today's Date: 02/26/2018 Time: SLP Start Time (ACUTE ONLY): 1447 SLP Stop Time (ACUTE ONLY): 1505 SLP Time Calculation (min) (ACUTE ONLY): 18 min  Past Medical History:  Past Medical History:  Diagnosis Date  . Anemia   . Anxiety   . Anxiety and depression, ( ? early dementia) 08/14/2011  . Arthritis   . Asthma   . Blood transfusion 2007   History of  . Chronic kidney disease    lt kidney limited fxn  . Chronic pain   . COPD (chronic obstructive pulmonary disease) with emphysema (HCC)   . DDD (degenerative disc disease)   . Dementia   . Depression   . Diabetes mellitus   . Gastroesophageal reflux disease   . Headache(784.0)   . Heart murmur    No significant valvular lesions on echo  . Heart palpitations   . History of CHF (congestive heart failure) 2007   2013: Normal EF by echo x2 with no significant diastolic dysfunction noted either  . Hyperlipidemia   . Hypertension   . Hypothyroidism (acquired)    On Levothyroxine  . Myocardial infarction (HCC)   . Osteoporosis   . PE (pulmonary embolism) February 2007; March 2013   Bilateral PEs in 07; massive PE March 2013 with moderate elevation in PA pressures.  . Seizures (HCC)    once  . Uterine prolapse    Past Surgical History:  Past Surgical History:  Procedure Laterality Date  . CARDIAC CATHETERIZATION  March 2013   No evidence of coronary disease; also no significant aortic or iliac or renal artery disease.  . Hiatel Hernia Repair    . LEFT HEART CATHETERIZATION WITH CORONARY ANGIOGRAM N/A 08/16/2011   Procedure: LEFT HEART CATHETERIZATION WITH CORONARY ANGIOGRAM;  Surgeon: Marykay Lexavid W Harding, MD;  [facility];  Service: Cardiovascular;  Laterality: N/A;  . TUBAL LIGATION     HPI:  72 y.o. female with medical history significant for CKD stage III, dementia, recent UTI, prior PE on Xarelto,  hypertension, dyslipidemia, type 2 diabetes, hypothyroidism, and GERD who was brought to the ED by her daughter as her mother appeared not to be improving on antibiotics recently prescribed for UTI.  She has had some worsening confusion as well as some hallucinations that are visual and auditory in nature.  She has apparently been talking to people who have been dead for 20 years.  She reports that her mother is also had a decreased appetite as well as some nausea, but no vomiting.  She has also noted some generalized weakness.  Patient is also noted to have some generalized abdominal discomfort and fullness with no bowel movement noted in the last 2 to 3 days. BSE ordered to r/o aspiration; abdominal CT neg for acute changes.  Assessment / Plan / Recommendation Clinical Impression   Pt appears to exhibit a normal oropharyngeal swallow without overt s/s of aspiration noted; timely swallow noted without difficulty with mastication with solids; no cognitive-based issues noted during PO intake; pt has a hx of GERD without s/s noted (I.e.: regurgitation, belching,etc.), but esophageal precautions recommended and discussed with pt; continue regular consistency (heart healthy/carb modified) diet with thin liquids; ST will s/o at this time as no treatment required; thank you for this consult. SLP Visit Diagnosis: Dysphagia, unspecified (R13.10)    Aspiration Risk  No limitations    Diet Recommendation   Regular/thin liquids  Medication  Administration: Whole meds with liquid    Other  Recommendations Oral Care Recommendations: Oral care BID   Follow up Recommendations None      Frequency and Duration     n/a; evaluation only       Prognosis Prognosis for Safe Diet Advancement: Good      Swallow Study   General Date of Onset: 02/23/18 HPI: 72 y.o. female with medical history significant for CKD stage III, dementia, recent UTI, prior PE on Xarelto, hypertension, dyslipidemia, type 2 diabetes,  hypothyroidism, and GERD who was brought to the ED by her daughter as her mother appeared not to be improving on antibiotics recently prescribed for UTI.  She has had some worsening confusion as well as some hallucinations that are visual and auditory in nature.  She has apparently been talking to people who have been dead for 20 years.  She reports that her mother is also had a decreased appetite as well as some nausea, but no vomiting.  She has also noted some generalized weakness.  Patient is also noted to have some generalized abdominal discomfort and fullness with no bowel movement noted in the last 2 to 3 days. Type of Study: Bedside Swallow Evaluation Previous Swallow Assessment: (n/a) Diet Prior to this Study: Regular;Thin liquids Temperature Spikes Noted: No Respiratory Status: Room air History of Recent Intubation: No Behavior/Cognition: Alert;Cooperative;Confused;Requires cueing Oral Cavity Assessment: Within Functional Limits Oral Care Completed by SLP: No Oral Cavity - Dentition: Adequate natural dentition;Missing dentition Vision: Functional for self-feeding Self-Feeding Abilities: Able to feed self Patient Positioning: Upright in bed Baseline Vocal Quality: Normal Volitional Cough: Strong Volitional Swallow: Able to elicit    Oral/Motor/Sensory Function Overall Oral Motor/Sensory Function: Within functional limits   Ice Chips Ice chips: Not tested   Thin Liquid Thin Liquid: Within functional limits Presentation: Cup    Nectar Thick Nectar Thick Liquid: Not tested   Honey Thick Honey Thick Liquid: Not tested   Puree Puree: Within functional limits Presentation: Self Fed   Solid     Solid: Within functional limits Presentation: Self Fed      Tressie Stalker, M.S., CCC-SLP 02/26/2018,4:09 PM

## 2018-02-26 NOTE — Progress Notes (Signed)
PROGRESS NOTE  [name] ZOX:[id number] DOB: [birth year] DOA: 02/23/2018 PCP: Raliegh Ip, DO  Brief History:  72 year old female with a history of dementia, CKD stage III, PE on rivaroxaban, COPD, hypertension, hyperlipidemia, coronary artery disease presenting with worsening confusion over 8 to 9-day..  Her confusion began on the morning of 02/16/2018 with visual and auditory hallucinations.  Her daughter took her to see her primary care physician.  The patient was diagnosed with a UTI and started on cephalexin.  Once the cultures returned, the patient was switched to ciprofloxacin.  Unfortunately, the patient continued to have confusion.  During the next several days, the patient has had increasing generalized weakness to the point of needing assistance to get out of bed and ambulate.  As result, the patient was brought to the hospital for further evaluation.  The patient has not been started any new medications aside from the antibiotics discussed above.  She has not had any recent sick contacts.  Patient has complained of some lower abdominal pain without any headaches, chest pain, shortness breath, vomiting, diarrhea.  In the emergency department, the patient was afebrile hemodynamically stable saturating 95-99% on room air.  Urinalysis was negative for pyuria.  BMP and CBC were essentially unremarkable.  Lactic acid peaked at 3.0.  Assessment/Plan: Acute metabolic encephalopathy -Likely multifactorial including dehydration, medication induced, and likely progression of the patient's underlying dementia -At baseline, the patient is able to perform basic ADLs and feed herself.  She is usually ANO x2 -Presently, patient is pleasantly confused and O x1. -Although the patient has acute on chronic renal failure, she was at her baseline renal function on 02/17/2018 at which time she was already confused -Serum B12 393 -Folic acid 6.0 -TSH 2.753 -Valproic acid level  35 -Urinalysis negative for pyuria -CT brain neg -Discontinue gabapentin, Ditropan -Overall mental status is improving and approaching baseline  Acute on chronic renal failure--CKD stage III -Holding losartan and Lasix -Continue IV fluids -CK normal  Diabetes mellitus type 2 -hemoglobin A1c 7.1 -Holding metformin -NovoLog sliding scale -Blood sugar stable  Hypothyroidism -Continue Synthroid -TSH 2.753  Lactic acidosis -Blood cultures and urine cultures show no growth -Likely due to volume depletion -Patient is afebrile hemodynamically stable  Dementia with behavioral disturbance -Haldol as needed agitation -Continue Aricept and Depakote  Essential hypertension -Continue metoprolol succinate -Holding losartan secondary to acute on chronic renal failure  History of pulmonary embolism -Continue rivaroxaban  Vomiting. -Plain films of abdomen shows nonobstructive bowel gas pattern.  Abdomen is benign on exam. -We will give a trial of Reglan -Start on MiraLAX -Continue Zofran    Disposition Plan:   Home when vomiting has resolved and able to tolerate p.o. Family Communication:  Daughter updated 02/26/18  Total time spent   Consultants:  none Code Status:  FULL   DVT Prophylaxis: Xarelto   Procedures: As Listed in Progress Note Above  Antibiotics: None    Subjective: Patient has had continued vomiting today after eating.  Last bowel movement was yesterday.  Objective: Vitals:   02/25/18 0542 02/25/18 1534 02/25/18 2141 02/26/18 0707  BP: (!) 133/54 (!) 140/58 (!) 156/58 (!) 179/63  Pulse: (!) 57 (!) 54 (!) 58 (!) 48  Resp: 17   17  Temp: (!) 97.3 F (36.3 C) 97.6 F (36.4 C) 98.2 F (36.8 C) (!) 97.4 F (36.3 C)  TempSrc: Oral Oral Oral Oral  SpO2: 98% 99% 96% 95%  Weight:  Height:        Intake/Output Summary (Last 24 hours) at 02/26/2018 1354 Last data filed at 02/26/2018 1300 Gross per 24 hour  Intake 989.22 ml  Output 1100  ml  Net -110.78 ml   Weight change:  Exam:  General exam: Alert, awake, oriented x 3 Respiratory system: Clear to auscultation. Respiratory effort normal. Cardiovascular system:RRR. No murmurs, rubs, gallops. Gastrointestinal system: Abdomen is nondistended, soft and nontender. No organomegaly or masses felt. Normal bowel sounds heard. Central nervous system: Alert and oriented. No focal neurological deficits. Extremities: No C/C/E, +pedal pulses Skin: No rashes, lesions or ulcers Psychiatry: confused  Data Reviewed: I have personally reviewed following labs and imaging studies Basic Metabolic Panel: Recent Labs  Lab 02/23/18 2347 02/24/18 0435 02/25/18 0530 02/26/18 0450  NA 139 142 141 141  K 4.1 4.1 5.0 4.7  CL 98 107 106 104  CO2 29 27 31 30   GLUCOSE 103* 79 125* 125*  BUN 22 21 20 15   CREATININE 1.75* 1.68* 1.30* 1.13*  CALCIUM 9.3 8.2* 9.0 9.3  MG  --   --  1.5* 1.9   Liver Function Tests: Recent Labs  Lab 02/23/18 2347 02/26/18 0450  AST 18 12*  ALT 17 13  ALKPHOS 52 48  BILITOT 0.6 0.6  PROT 7.3 6.5  ALBUMIN 3.5 3.1*   Recent Labs  Lab 02/26/18 0450  LIPASE 31   Recent Labs  Lab 02/24/18 0857  AMMONIA 13   Coagulation Profile: No results for input(s): INR, PROTIME in the last 168 hours. CBC: Recent Labs  Lab 02/23/18 2347 02/25/18 0530  WBC 6.3 5.0  NEUTROABS 2.8  --   HGB 12.9 11.3*  HCT 39.4 35.4*  MCV 95.9 98.1  PLT 232 201   Cardiac Enzymes: Recent Labs  Lab 02/24/18 0857  CKTOTAL 41   BNP: Invalid input(s): POCBNP CBG: Recent Labs  Lab 02/25/18 1155 02/25/18 1601 02/25/18 2135 02/26/18 0740 02/26/18 1128  GLUCAP 134* 129* 112* 110* 125*   HbA1C: Recent Labs    02/24/18 0857  HGBA1C 7.1*   Urine analysis:    Component Value Date/Time   COLORURINE YELLOW 02/24/2018 0020   APPEARANCEUR CLEAR 02/24/2018 0020   APPEARANCEUR Hazy (A) 02/17/2018 1525   LABSPEC 1.013 02/24/2018 0020   PHURINE 5.0 02/24/2018 0020    GLUCOSEU NEGATIVE 02/24/2018 0020   HGBUR NEGATIVE 02/24/2018 0020   BILIRUBINUR NEGATIVE 02/24/2018 0020   BILIRUBINUR Negative 02/17/2018 1525   KETONESUR NEGATIVE 02/24/2018 0020   PROTEINUR NEGATIVE 02/24/2018 0020   UROBILINOGEN 2.0 04/04/2015 1453   UROBILINOGEN 1.0 02/19/2015 2000   NITRITE NEGATIVE 02/24/2018 0020   LEUKOCYTESUR NEGATIVE 02/24/2018 0020   LEUKOCYTESUR 1+ (A) 02/17/2018 1525   Sepsis Labs: @LABRCNTIP (procalcitonin:4,lacticidven:4) ) Recent Results (from the past 240 hour(s))  Microscopic Examination     Status: Abnormal   Collection Time: 02/17/18  3:25 PM  Result Value Ref Range Status   WBC, UA 11-30 (A) 0 - 5 /hpf Final   RBC, UA 3-10 (A) 0 - 2 /hpf Final   Epithelial Cells (non renal) 0-10 0 - 10 /hpf Final   Renal Epithel, UA 0-10 (A) None seen /hpf Final   Bacteria, UA Many (A) None seen/Few Final  Urine Culture     Status: Abnormal   Collection Time: 02/17/18  3:57 PM  Result Value Ref Range Status   Urine Culture, Routine Final report (A)  Final   Organism ID, Bacteria Escherichia coli (A)  Final  Comment: Greater than 100,000 colony forming units per mL   Antimicrobial Susceptibility Comment  Final    Comment:       ** S = Susceptible; I = Intermediate; R = Resistant **                    P = Positive; N = Negative             MICS are expressed in micrograms per mL    Antibiotic                 RSLT#1    RSLT#2    RSLT#3    RSLT#4 Amoxicillin/Clavulanic Acid    I Ampicillin                     R Cefazolin                      R Cefepime                       S Ceftriaxone                    S Cefuroxime                     I Ciprofloxacin                  S Ertapenem                      S Gentamicin                     S Imipenem                       S Levofloxacin                   S Meropenem                      S Nitrofurantoin                 S Piperacillin/Tazobactam        S Tetracycline                   S Tobramycin                      S Trimethoprim/Sulfa             S   Culture, blood (routine x 2)     Status: None (Preliminary result)   Collection Time: 02/23/18 11:47 PM  Result Value Ref Range Status   Specimen Description BLOOD RIGHT HAND  Final   Special Requests   Final    BOTTLES DRAWN AEROBIC AND ANAEROBIC Blood Culture adequate volume   Culture   Final    NO GROWTH 2 DAYS Performed at [facility], [address] [place], [place] [id number]    Report Status PENDING  Incomplete  Culture, blood (routine x 2)     Status: None (Preliminary result)   Collection Time: 02/23/18 11:56 PM  Result Value Ref Range Status   Specimen Description BLOOD LEFT HAND  Final   Special Requests   Final    BOTTLES DRAWN AEROBIC AND ANAEROBIC Blood Culture results may not be optimal due to an inadequate volume of blood  received in culture bottles   Culture   Final    NO GROWTH 2 DAYS Performed at [facility], [address] [place], [place] [address]    Report Status PENDING  Incomplete  Urine culture     Status: None   Collection Time: 02/24/18 12:20 AM  Result Value Ref Range Status   Specimen Description   Final    URINE, CATHETERIZED Performed at [facility], [address] [employer] [place] [place], [place] [address]    Special Requests   Final    Normal Performed at [facility], [address] [place], [place] [address]    Culture   Final    NO GROWTH Performed at [facility], [address], [place], [place] [address]    Report Status 02/25/2018 FINAL  Final     Scheduled Meds: . amLODipine  5 mg Oral Daily  . atorvastatin  20 mg Oral q1800  . bisacodyl  10 mg Rectal Once  . cholecalciferol  5,000 Units Oral Daily  . divalproex  250 mg Oral Daily  . divalproex  500 mg Oral QHS  . donepezil  10 mg Oral BID  . insulin aspart  0-9 Units Subcutaneous TID WC  . levothyroxine  88 mcg Oral QAC breakfast  . metoCLOPramide (REGLAN) injection  5 mg Intravenous Q6H  . metoprolol succinate  12.5 mg Oral  q morning - 10a  . omega-3 acid ethyl esters  1 capsule Oral BH-q7a  . pantoprazole  40 mg Oral BID  . polyethylene glycol  17 g Oral Daily  . rivaroxaban  10 mg Oral QPM   Continuous Infusions: . lactated ringers 100 mL/hr at 02/26/18 1234    Procedures/Studies: Ct Head Wo Contrast  Result Date: 02/24/2018 CLINICAL DATA:  Worsening dimension altered level of consciousness. EXAM: CT HEAD WITHOUT CONTRAST TECHNIQUE: Contiguous axial images were obtained from the base of the skull through the vertex without intravenous contrast. COMPARISON:  08/01/2016 FINDINGS: Brain: Moderate degree of small vessel ischemia with sulcal and ventricular prominence consistent with superficial and central atrophy, intervally increased in appearance since prior. Midline fourth ventricle and basal cisterns without effacement. No large vascular territory infarction, hemorrhage or midline shift. No intra-axial mass nor extra-axial fluid collections. Basal ganglial calcifications are identified bilaterally. The brainstem and cerebellum are intact without acute appearing abnormality. Vascular: No hyperdense vessel sign. Skull: No acute osseous abnormality or worrisome osseous lesions. No significant calvarial soft tissue swelling. Sinuses/Orbits: Clear paranasal sinuses and mastoids. Intact orbits and globes. Other: None IMPRESSION: There appears to have been some interval progression in cerebral atrophy and chronic appearing small vessel ischemic disease. No acute intracranial abnormality identified. Electronically Signed   By: Tollie Eth M.D.   On: 02/24/2018 00:47   Dg Abd Portable 1v  Result Date: 02/25/2018 CLINICAL DATA:  Vomiting with lower abdominal pain EXAM: PORTABLE ABDOMEN - 1 VIEW COMPARISON:  08/20/2013 FINDINGS: Nonobstructed bowel gas pattern with moderate stool. Phleboliths in the left pelvis. IMPRESSION: Nonobstructed bowel-gas pattern Electronically Signed   By: Jasmine Pang M.D.   On: 02/25/2018 20:33     Erick Blinks, MD  [facility] Pager [phone]  If 7PM-7AM, please contact night-coverage [email or web address] Password [email or web address] 02/26/2018, 1:54 PM   LOS: 2 days

## 2018-02-27 DIAGNOSIS — G9340 Encephalopathy, unspecified: Secondary | ICD-10-CM

## 2018-02-27 LAB — GLUCOSE, CAPILLARY
GLUCOSE-CAPILLARY: 122 mg/dL — AB (ref 70–99)
Glucose-Capillary: 152 mg/dL — ABNORMAL HIGH (ref 70–99)

## 2018-02-27 MED ORDER — FUROSEMIDE 40 MG PO TABS: 20.0000 mg | ORAL_TABLET | Freq: Every day | ORAL | 4 refills | Status: AC | PRN

## 2018-02-27 MED ORDER — POLYETHYLENE GLYCOL 3350 17 G PO PACK: 17.0000 g | PACK | Freq: Every day | ORAL | 0 refills | Status: AC

## 2018-02-27 MED ORDER — AMLODIPINE BESYLATE 5 MG PO TABS: 5.0000 mg | ORAL_TABLET | Freq: Every day | ORAL | 0 refills | Status: AC

## 2018-02-27 MED ORDER — QUETIAPINE FUMARATE 200 MG PO TABS: 100.0000 mg | ORAL_TABLET | Freq: Every day | ORAL | 4 refills | Status: AC

## 2018-02-27 MED ORDER — ONDANSETRON HCL 4 MG PO TABS: 4.0000 mg | ORAL_TABLET | Freq: Four times a day (QID) | ORAL | 0 refills | Status: AC | PRN

## 2018-02-27 NOTE — Care Management Important Message (Signed)
Important Message  Patient Details  Name: [name] MRN: [id number] Date of Birth: [birth year]   Medicare Important Message Given:  Yes    Renie OraHawkins, Briannah Lona Smith 02/27/2018, 10:31 AM

## 2018-02-27 NOTE — Discharge Summary (Signed)
Physician Discharge Summary  [name]:[id number] DOB: 1945-07-06 DOA: 02/23/2018  PCP: Raliegh Ip, DO  Admit date: 02/23/2018 Discharge date: 02/27/2018  Admitted From: Home Disposition: Home  Recommendations for Outpatient Follow-up:  1. Follow up with PCP in 1-2 weeks 2. Please obtain BMP/CBC in one week to follow renal function  Discharge Condition: Stable CODE STATUS: Full code Diet recommendation: Heart healthy  Brief/Interim Summary: 72 year old female with a history of dementia, chronic kidney disease stage III, pulmonary embolism on anticoagulation, presented with worsening confusion over 89-day..  She had been diagnosed with a UTI recently and was treated with antibiotics.  She had associated generalized weakness to the point that she was having trouble getting out of bed and ambulating.  On arrival to the emergency room, she was noted to have acute on chronic renal failure.  She was clinically dehydrated.  Her Lasix and ARB were held on admission.  She was started on IV fluids.  Renal function has since improved and is back to baseline.  Dehydration is also resolved.  She is on multiple sedative medications which were held on the hospital.  Mental status has significantly improved and has returned to baseline.  We have discontinued Ditropan and gabapentin.  Seroquel dose has been reduced.  The patient did have nausea and vomiting.  She was placed on Reglan with improvement of her symptoms.  Will discharge on PRN Zofran.  She is also been started on MiraLAX for regular bowel movements.  The remainder of her medical problems remained stable in the hospital.  She did have some agitation in the hospital, but I suspect this is related to change in environment.  This should improve when she returns home to her familiar environment.  Her daughter feels that her mental status has improved and is now approaching baseline.  Patient will be discharged home.  Discharge  Diagnoses:  Principal Problem:   Acute metabolic encephalopathy Active Problems:   Hyperlipidemia with target LDL less than 100   Essential hypertension, benign   Hx pulmonary embolism   COPD mixed type (HCC)   Chronic anticoagulation, pt will need lifelong anticoagulation   Obesity (BMI 30-39.9)   Gastroesophageal reflux disease without esophagitis   AKI (acute kidney injury) (HCC)   Hypothyroidism   CKD stage 3 due to type 2 diabetes mellitus (HCC)   Dementia without behavioral disturbance   Lactic acidosis   Encephalopathy acute   Dehydration   Acute renal failure superimposed on stage 3 chronic kidney disease (HCC)   Dementia with behavioral disturbance    Discharge Instructions  Discharge Instructions    Diet - low sodium heart healthy   Complete by:  As directed    Increase activity slowly   Complete by:  As directed      Allergies as of 02/27/2018      Reactions   Procaine Hcl Anaphylaxis   Codeine    "knocks me out" - states it is too strong   Latex Hives, Rash   Penicillins Diarrhea   Has patient had a PCN reaction causing immediate rash, facial/tongue/throat swelling, SOB or lightheadedness with hypotension: No Has patient had a PCN reaction causing severe rash involving mucus membranes or skin necrosis: No Has patient had a PCN reaction that required hospitalization No Has patient had a PCN reaction occurring within the last 10 years: No If all of the above answers are "NO", then may proceed with Cephalosporin use.      Medication List  STOP taking these medications   gabapentin 300 MG capsule Commonly known as:  NEURONTIN   losartan 25 MG tablet Commonly known as:  COZAAR   oxybutynin 5 MG tablet Commonly known as:  DITROPAN   sulfamethoxazole-trimethoprim 800-160 MG tablet Commonly known as:  BACTRIM DS,SEPTRA DS     TAKE these medications   ACCU-CHEK SOFTCLIX LANCETS lancets USE TO CHECK BLOOD SUGAR UP TO TWICE DAILY . DX: E11.9 - TYPE 2  DIABETES MELLITUS   amLODipine 5 MG tablet Commonly known as:  NORVASC Take 1 tablet (5 mg total) by mouth daily. Start taking on:  02/28/2018   divalproex 250 MG 24 hr tablet Commonly known as:  DEPAKOTE ER Take 1 tab at dinner, 2 tabs at bedtime What changed:    how much to take  how to take this  when to take this   donepezil 10 MG tablet Commonly known as:  ARICEPT Take 1 tablet (10 mg total) by mouth 2 (two) times daily.   DULoxetine 30 MG capsule Commonly known as:  CYMBALTA TAKE ONE CAPSULE BY MOUTH EVERY DAY   Fish Oil 1000 MG Caps Take 1 capsule by mouth every morning.   furosemide 40 MG tablet Commonly known as:  LASIX Take 0.5 tablets (20 mg total) by mouth daily as needed for fluid. What changed:    when to take this  reasons to take this   glimepiride 2 MG tablet Commonly known as:  AMARYL TAKE 1 TABLET BY MOUTH EVERY DAY BEFORE BREAKFAST What changed:  See the new instructions.   glucose blood test strip USE TO CHECK BLOOD GLUCOSE UP TO TWICE DAILY   levothyroxine 88 MCG tablet Commonly known as:  SYNTHROID, LEVOTHROID TAKE ONE TABLET BY MOUTH EVERY MORNING BEFORE BREAKFAST What changed:  when to take this   meclizine 50 MG tablet Commonly known as:  ANTIVERT Take 50 mg by mouth daily. Reported on 09/27/2015   metFORMIN 500 MG tablet Commonly known as:  GLUCOPHAGE TAKE 1 TABLET BY MOUTH TWICE DAILY WITH A MEAL What changed:  See the new instructions.   metoprolol succinate 25 MG 24 hr tablet Commonly known as:  TOPROL-XL TAKE 1/2 TABLET BY MOUTH EVERY DAY What changed:  when to take this   nystatin powder Commonly known as:  MYCOSTATIN/NYSTOP Apply topically 4 (four) times daily.   ondansetron 4 MG tablet Commonly known as:  ZOFRAN Take 1 tablet (4 mg total) by mouth every 6 (six) hours as needed for nausea.   pantoprazole 20 MG tablet Commonly known as:  PROTONIX TAKE 1 TABLET BY MOUTH TWICE DAILY   polyethylene glycol  packet Commonly known as:  MIRALAX / GLYCOLAX Take 17 g by mouth daily. Start taking on:  02/28/2018   potassium chloride SA 20 MEQ tablet Commonly known as:  K-DUR,KLOR-CON TAKE 1 TABLET BY MOUTH EVERY MORNING   QUEtiapine 200 MG tablet Commonly known as:  SEROQUEL Take 0.5 tablets (100 mg total) by mouth at bedtime. What changed:  how much to take   simvastatin 40 MG tablet Commonly known as:  ZOCOR TAKE 1 TABLET BY MOUTH AT BEDTIME   Vitamin D3 5000 units Caps Take 5,000 Units by mouth daily.   XARELTO 10 MG Tabs tablet Generic drug:  rivaroxaban TAKE 1 TABLET BY MOUTH DAILY What changed:  how much to take      Follow-up Information    Delynn FlavinGottschalk, Ashly M, DO. Schedule an appointment as soon as possible for a visit in 2  week(s).   Specialty:  Family Medicine Contact information: [address] [place] [address] [phone]          Allergies  Allergen Reactions  . Procaine Hcl Anaphylaxis  . Codeine     "knocks me out" - states it is too strong  . Latex Hives and Rash  . Penicillins Diarrhea    Has patient had a PCN reaction causing immediate rash, facial/tongue/throat swelling, SOB or lightheadedness with hypotension: No Has patient had a PCN reaction causing severe rash involving mucus membranes or skin necrosis: No Has patient had a PCN reaction that required hospitalization No Has patient had a PCN reaction occurring within the last 10 years: No If all of the above answers are "NO", then may proceed with Cephalosporin use.     Consultations:     Procedures/Studies: Ct Head Wo Contrast  Result Date: 02/24/2018 CLINICAL DATA:  Worsening dimension altered level of consciousness. EXAM: CT HEAD WITHOUT CONTRAST TECHNIQUE: Contiguous axial images were obtained from the base of the skull through the vertex without intravenous contrast. COMPARISON:  08/01/2016 FINDINGS: Brain: Moderate degree of small vessel ischemia with sulcal and ventricular  prominence consistent with superficial and central atrophy, intervally increased in appearance since prior. Midline fourth ventricle and basal cisterns without effacement. No large vascular territory infarction, hemorrhage or midline shift. No intra-axial mass nor extra-axial fluid collections. Basal ganglial calcifications are identified bilaterally. The brainstem and cerebellum are intact without acute appearing abnormality. Vascular: No hyperdense vessel sign. Skull: No acute osseous abnormality or worrisome osseous lesions. No significant calvarial soft tissue swelling. Sinuses/Orbits: Clear paranasal sinuses and mastoids. Intact orbits and globes. Other: None IMPRESSION: There appears to have been some interval progression in cerebral atrophy and chronic appearing small vessel ischemic disease. No acute intracranial abnormality identified. Electronically Signed   By: Tollie Eth M.D.   On: 02/24/2018 00:47   Dg Abd Portable 1v  Result Date: 02/25/2018 CLINICAL DATA:  Vomiting with lower abdominal pain EXAM: PORTABLE ABDOMEN - 1 VIEW COMPARISON:  08/20/2013 FINDINGS: Nonobstructed bowel gas pattern with moderate stool. Phleboliths in the left pelvis. IMPRESSION: Nonobstructed bowel-gas pattern Electronically Signed   By: Jasmine Pang M.D.   On: 02/25/2018 20:33       Subjective: No further nausea or vomiting.  Feeling better.  Discharge Exam: Vitals:   02/26/18 1416 02/26/18 2136 02/27/18 0522 02/27/18 1305  BP: (!) 156/54 (!) 175/55 (!) 141/55 (!) 147/70  Pulse: (!) 47 62 70 70  Resp: 18   16  Temp: 97.7 F (36.5 C) 98.2 F (36.8 C) 98.4 F (36.9 C)   TempSrc: Oral Oral Oral   SpO2: 98% 99% 99% 93%  Weight:      Height:        General: Pt is alert, awake, not in acute distress Cardiovascular: RRR, S1/S2 +, no rubs, no gallops Respiratory: CTA bilaterally, no wheezing, no rhonchi Abdominal: Soft, NT, ND, bowel sounds + Extremities: no edema, no cyanosis    The results of  significant diagnostics from this hospitalization (including imaging, microbiology, ancillary and laboratory) are listed below for reference.     Microbiology: Recent Results (from the past 240 hour(s))  Culture, blood (routine x 2)     Status: None (Preliminary result)   Collection Time: 02/23/18 11:47 PM  Result Value Ref Range Status   Specimen Description BLOOD RIGHT HAND  Final   Special Requests   Final    BOTTLES DRAWN AEROBIC AND ANAEROBIC Blood Culture adequate  volume   Culture   Final    NO GROWTH 3 DAYS Performed at [facility], [address] [place], [place] [address]    Report Status PENDING  Incomplete  Culture, blood (routine x 2)     Status: None (Preliminary result)   Collection Time: 02/23/18 11:56 PM  Result Value Ref Range Status   Specimen Description BLOOD LEFT HAND  Final   Special Requests   Final    BOTTLES DRAWN AEROBIC AND ANAEROBIC Blood Culture results may not be optimal due to an inadequate volume of blood received in culture bottles   Culture   Final    NO GROWTH 3 DAYS Performed at [facility], [address], [place], [place] [address]    Report Status PENDING  Incomplete  Urine culture     Status: None   Collection Time: 02/24/18 12:20 AM  Result Value Ref Range Status   Specimen Description   Final    URINE, CATHETERIZED Performed at [facility], [address] [place], [place] [address]    Special Requests   Final    Normal Performed at [facility], [address], [place], [place] [address]    Culture   Final    NO GROWTH Performed at [facility], [address] [place], [place] [address]    Report Status 02/25/2018 FINAL  Final     Labs: BNP (last 3 results) No results for input(s): BNP in the last 8760 hours. Basic Metabolic Panel: Recent Labs  Lab 02/23/18 2347 02/24/18 0435 02/25/18 0530 02/26/18 0450  NA 139 142 141 141  K 4.1 4.1 5.0 4.7  CL 98 107 106 104  CO2 29 27 31 30   GLUCOSE 103* 79 125* 125*  BUN  22 21 20 15   CREATININE 1.75* 1.68* 1.30* 1.13*  CALCIUM 9.3 8.2* 9.0 9.3  MG  --   --  1.5* 1.9   Liver Function Tests: Recent Labs  Lab 02/23/18 2347 02/26/18 0450  AST 18 12*  ALT 17 13  ALKPHOS 52 48  BILITOT 0.6 0.6  PROT 7.3 6.5  ALBUMIN 3.5 3.1*   Recent Labs  Lab 02/26/18 0450  LIPASE 31   Recent Labs  Lab 02/24/18 0857  AMMONIA 13   CBC: Recent Labs  Lab 02/23/18 2347 02/25/18 0530  WBC 6.3 5.0  NEUTROABS 2.8  --   HGB 12.9 11.3*  HCT 39.4 35.4*  MCV 95.9 98.1  PLT 232 201   Cardiac Enzymes: Recent Labs  Lab 02/24/18 0857  CKTOTAL 41   BNP: Invalid input(s): POCBNP CBG: Recent Labs  Lab 02/26/18 1631 02/26/18 1731 02/26/18 2137 02/27/18 0726 02/27/18 1130  GLUCAP 155* 145* 98 122* 152*   D-Dimer No results for input(s): DDIMER in the last 72 hours. Hgb A1c No results for input(s): HGBA1C in the last 72 hours. Lipid Profile No results for input(s): CHOL, HDL, LDLCALC, TRIG, CHOLHDL, LDLDIRECT in the last 72 hours. Thyroid function studies No results for input(s): TSH, T4TOTAL, T3FREE, THYROIDAB in the last 72 hours.  Invalid input(s): FREET3 Anemia work up No results for input(s): VITAMINB12, FOLATE, FERRITIN, TIBC, IRON, RETICCTPCT in the last 72 hours. Urinalysis    Component Value Date/Time   COLORURINE YELLOW 02/24/2018 0020   APPEARANCEUR CLEAR 02/24/2018 0020   APPEARANCEUR Hazy (A) 02/17/2018 1525   LABSPEC 1.013 02/24/2018 0020   PHURINE 5.0 02/24/2018 0020   GLUCOSEU NEGATIVE 02/24/2018 0020   HGBUR NEGATIVE 02/24/2018 0020   BILIRUBINUR NEGATIVE  02/24/2018 0020   BILIRUBINUR Negative 02/17/2018 1525   KETONESUR NEGATIVE 02/24/2018 0020   PROTEINUR NEGATIVE 02/24/2018 0020   UROBILINOGEN 2.0 04/04/2015 1453   UROBILINOGEN 1.0 02/19/2015 2000   NITRITE NEGATIVE 02/24/2018 0020   LEUKOCYTESUR NEGATIVE 02/24/2018 0020   LEUKOCYTESUR 1+ (A) 02/17/2018 1525   Sepsis Labs Invalid input(s): PROCALCITONIN,  WBC,   LACTICIDVEN Microbiology Recent Results (from the past 240 hour(s))  Culture, blood (routine x 2)     Status: None (Preliminary result)   Collection Time: 02/23/18 11:47 PM  Result Value Ref Range Status   Specimen Description BLOOD RIGHT HAND  Final   Special Requests   Final    BOTTLES DRAWN AEROBIC AND ANAEROBIC Blood Culture adequate volume   Culture   Final    NO GROWTH 3 DAYS Performed at [facility], [address] [place], [place] [address]    Report Status PENDING  Incomplete  Culture, blood (routine x 2)     Status: None (Preliminary result)   Collection Time: 02/23/18 11:56 PM  Result Value Ref Range Status   Specimen Description BLOOD LEFT HAND  Final   Special Requests   Final    BOTTLES DRAWN AEROBIC AND ANAEROBIC Blood Culture results may not be optimal due to an inadequate volume of blood received in culture bottles   Culture   Final    NO GROWTH 3 DAYS Performed at [facility], [address] [place], [place] [address]    Report Status PENDING  Incomplete  Urine culture     Status: None   Collection Time: 02/24/18 12:20 AM  Result Value Ref Range Status   Specimen Description   Final    URINE, CATHETERIZED Performed at [facility], [address] [place], [place] [address]    Special Requests   Final    Normal Performed at [facility], [address] [place], [place] [address]    Culture   Final    NO GROWTH Performed at [facility], [address], [place], [place] [address]    Report Status 02/25/2018 FINAL  Final     Time coordinating discharge:  SIGNED:   Erick Blinks, MD  [facility] 02/27/2018, 5:41 PM Pager   If 7PM-7AM, please contact night-coverage [email or web address] Password [email or web address]

## 2018-03-01 LAB — CULTURE, BLOOD (ROUTINE X 2)
Culture: NO GROWTH
Culture: NO GROWTH
Special Requests: ADEQUATE

## 2018-03-02 ENCOUNTER — Encounter: Payer: Self-pay | Admitting: Family Medicine

## 2018-03-02 ENCOUNTER — Ambulatory Visit (INDEPENDENT_AMBULATORY_CARE_PROVIDER_SITE_OTHER): Payer: Medicare HMO | Admitting: Family Medicine

## 2018-03-02 VITALS — BP 129/69 | HR 63 | Temp 98.0°F

## 2018-03-02 DIAGNOSIS — F0150 Vascular dementia without behavioral disturbance: Secondary | ICD-10-CM | POA: Diagnosis not present

## 2018-03-02 DIAGNOSIS — R413 Other amnesia: Secondary | ICD-10-CM | POA: Diagnosis not present

## 2018-03-02 DIAGNOSIS — R410 Disorientation, unspecified: Secondary | ICD-10-CM

## 2018-03-02 NOTE — Telephone Encounter (Signed)
Patient seen in ER on 02/23/18, seen in office today by Dr. Louanne Skyeettinger

## 2018-03-02 NOTE — Progress Notes (Addendum)
BP 129/69   Pulse 63   Temp 98 F (36.7 C) (Oral)    Subjective:    Patient ID: [name], female    DOB: June 20, 1945, 72 y.o.   MRN: [id number]  HPI: [name] is a 72 y.o. female presenting on 03/02/2018 for Hospitalization Follow-up (AP 9/16- Daughter states that since she left the hospital she has gotten worse. Will only eat if she is feed , wearing diaper now and having trouble walking. Went down hill fast in a month ); Hallucinations; Anorexia; and Dehydration   HPI Hospital follow-up Patient is being brought in today by her daughter for hospital follow-up.  She went to [facility] on 02/23/2018 and was discharged on 02/27/2018.  Patient went into the hospital having had a history of dementia that was worsening with acute delirium and hallucinations and decreased appetite and was found to be dehydrated as well.  They also found her with generalized weakness and difficulty getting out of bed and ambulating which was abnormal for her.  She had dementia but she was always able to move and walk and do things on her own and follow commands.  She was treated for the UTI in the hospital and treated upon discharge for UTI as well.  Patient's daughter comes in here with her today and the patient is wheelchair-bound and has difficulty following even simple or basic commands.  Patient also is noted to have difficulty remembering her daughter's name who is in here with her as well.  During the hospital stay [place] that she was on multiple sedating medications including Ditropan and gabapentin leaving the hospital but the daughter says she is continued to worsen mentally and physically to the point where she is very concerned about her.  The hospitalist that her mental status improved prior to discharge per the daughter says it does come right back since leaving.  She denies her have any urinary issues.  Patient is unable to urinate in a cup for [place] today.  Relevant past medical, surgical,  family and social history reviewed and updated as indicated. Interim medical history since our last visit reviewed. Allergies and medications reviewed and updated.  Review of Systems  Constitutional: Positive for activity change, appetite change and fatigue. Negative for chills and fever.  HENT: Negative for congestion, ear discharge and ear pain.   Eyes: Negative for redness and visual disturbance.  Respiratory: Negative for chest tightness and shortness of breath.   Cardiovascular: Negative for chest pain and leg swelling.  Genitourinary: Negative for difficulty urinating and dysuria.  Musculoskeletal: Positive for back pain. Negative for gait problem.  Skin: Negative for rash.  Neurological: Positive for speech difficulty and weakness. Negative for light-headedness and headaches.  Psychiatric/Behavioral: Negative for agitation and behavioral problems.  All other systems reviewed and are negative.   Per HPI unless specifically indicated above   Allergies as of 03/02/2018      Reactions   Procaine Hcl Anaphylaxis   Codeine    "knocks me out" - states it is too strong   Latex Hives, Rash   Penicillins Diarrhea   Has patient had a PCN reaction causing immediate rash, facial/tongue/throat swelling, SOB or lightheadedness with hypotension: No Has patient had a PCN reaction causing severe rash involving mucus membranes or skin necrosis: No Has patient had a PCN reaction that required hospitalization No Has patient had a PCN reaction occurring within the last 10 years: No If all of the above answers are "NO",  then may proceed with Cephalosporin use.      Medication List        Accurate as of 03/02/18  2:59 PM. Always use your most recent med list.          ACCU-CHEK SOFTCLIX LANCETS lancets USE TO CHECK BLOOD SUGAR UP TO TWICE DAILY . DX: E11.9 - TYPE 2 DIABETES MELLITUS   amLODipine 5 MG tablet Commonly known as:  NORVASC Take 1 tablet (5 mg total) by mouth daily.     divalproex 250 MG 24 hr tablet Commonly known as:  DEPAKOTE ER Take 1 tab at dinner, 2 tabs at bedtime   donepezil 10 MG tablet Commonly known as:  ARICEPT Take 1 tablet (10 mg total) by mouth 2 (two) times daily.   DULoxetine 30 MG capsule Commonly known as:  CYMBALTA TAKE ONE CAPSULE BY MOUTH EVERY DAY   Fish Oil 1000 MG Caps Take 1 capsule by mouth every morning.   furosemide 40 MG tablet Commonly known as:  LASIX Take 0.5 tablets (20 mg total) by mouth daily as needed for fluid.   glimepiride 2 MG tablet Commonly known as:  AMARYL TAKE 1 TABLET BY MOUTH EVERY DAY BEFORE BREAKFAST   glucose blood test strip USE TO CHECK BLOOD GLUCOSE UP TO TWICE DAILY   levothyroxine 88 MCG tablet Commonly known as:  SYNTHROID, LEVOTHROID TAKE ONE TABLET BY MOUTH EVERY MORNING BEFORE BREAKFAST   meclizine 50 MG tablet Commonly known as:  ANTIVERT Take 50 mg by mouth daily. Reported on 09/27/2015   metFORMIN 500 MG tablet Commonly known as:  GLUCOPHAGE TAKE 1 TABLET BY MOUTH TWICE DAILY WITH A MEAL   metoprolol succinate 25 MG 24 hr tablet Commonly known as:  TOPROL-XL TAKE 1/2 TABLET BY MOUTH EVERY DAY   nystatin powder Commonly known as:  MYCOSTATIN/NYSTOP Apply topically 4 (four) times daily.   ondansetron 4 MG tablet Commonly known as:  ZOFRAN Take 1 tablet (4 mg total) by mouth every 6 (six) hours as needed for nausea.   pantoprazole 20 MG tablet Commonly known as:  PROTONIX TAKE 1 TABLET BY MOUTH TWICE DAILY   polyethylene glycol packet Commonly known as:  MIRALAX / GLYCOLAX Take 17 g by mouth daily.   potassium chloride SA 20 MEQ tablet Commonly known as:  K-DUR,KLOR-CON TAKE 1 TABLET BY MOUTH EVERY MORNING   QUEtiapine 200 MG tablet Commonly known as:  SEROQUEL Take 0.5 tablets (100 mg total) by mouth at bedtime.   simvastatin 40 MG tablet Commonly known as:  ZOCOR TAKE 1 TABLET BY MOUTH AT BEDTIME   Vitamin D3 5000 units Caps Take 5,000 Units by mouth  daily.   XARELTO 10 MG Tabs tablet Generic drug:  rivaroxaban TAKE 1 TABLET BY MOUTH DAILY          Objective:    BP 129/69   Pulse 63   Temp 98 F (36.7 C) (Oral)   Wt Readings from Last 3 Encounters:  02/24/18 194 lb 3.6 oz (88.1 kg)  02/17/18 189 lb (85.7 kg)  01/19/18 184 lb (83.5 kg)    Physical Exam  Constitutional: She is oriented to person, place, and time. She appears well-developed and well-nourished. No distress.  Eyes: Pupils are equal, round, and reactive to light. Conjunctivae and EOM are normal.  Cardiovascular: Normal rate, regular rhythm, normal heart sounds and intact distal pulses.  No murmur heard. Pulmonary/Chest: Effort normal and breath sounds normal. No respiratory distress. She has no wheezes.  Musculoskeletal: Normal  range of motion. She exhibits no edema.  Neurological: She is alert and oriented to person, place, and time. No cranial nerve deficit or sensory deficit. Coordination normal.  Patient has difficulty following even simple commands, when asked to lift her arm she says yes and does not do it and when asked to move her legs she says yes and then does not do it.  At one point she has difficulty remembering her daughter's name is sitting right in front of her and that at another point she remembered it  Skin: Skin is warm and dry. No rash noted. She is not diaphoretic.  Psychiatric: She has a normal mood and affect. Her speech is normal and behavior is normal. Cognition and memory are impaired. She exhibits abnormal recent memory.  Nursing note and vitals reviewed.       Assessment & Plan:   Problem List Items Addressed This Visit      Nervous and Auditory   Dementia without behavioral disturbance    Other Visit Diagnoses    Acute memory impairment    -  Primary   Acute delirium          Patient comes in for hospital follow-up for acute delirium and hallucinations.  She was seen in the hospital and had a CT had and was treated for UTI  but daughter has noticed that she has not improved at all despite treatment and the urine culture came back negative.  They did do a CT head which showed vascular dementia but no MRI was performed.  Daughter says that she has not improved at all since the hospital or since treatment of the UTI.  Before she had dementia and now she has dementia to the point where she is in diapers and cannot remember her daughter's name and cannot follow simple commands such as raise her arm or raise her leg. We discussed this extensively and the likely possibility is that she had a stroke or vascular event of the brain of some kind that has acutely worsened her dementia.  We discussed because of the inability to take care of herself at all he can remember simple things getting hospice or end-of-life care on board and daughter was agreeable and took the information for hospice with her.  Have ordered for patient a hospital bed with a left and a bedside commode so family can help take care of her.  Follow up plan: Return if symptoms worsen or fail to improve.  Counseling provided for all of the vaccine components No orders of the defined types were placed in this encounter.   Arville CareJoshua Dettinger, MD [facility] 03/02/2018, 2:59 PM

## 2018-03-17 ENCOUNTER — Other Ambulatory Visit: Payer: Self-pay | Admitting: Family

## 2018-03-17 ENCOUNTER — Other Ambulatory Visit: Payer: Self-pay | Admitting: Family Medicine

## 2018-03-17 DIAGNOSIS — N3281 Overactive bladder: Secondary | ICD-10-CM

## 2018-03-18 ENCOUNTER — Other Ambulatory Visit: Payer: Self-pay | Admitting: Neurology

## 2018-03-18 ENCOUNTER — Other Ambulatory Visit: Payer: Self-pay | Admitting: *Deleted

## 2018-03-18 DIAGNOSIS — F03B18 Unspecified dementia, moderate, with other behavioral disturbance: Secondary | ICD-10-CM

## 2018-03-18 DIAGNOSIS — I2510 Atherosclerotic heart disease of native coronary artery without angina pectoris: Secondary | ICD-10-CM

## 2018-03-18 DIAGNOSIS — F0391 Unspecified dementia with behavioral disturbance: Secondary | ICD-10-CM

## 2018-03-18 MED ORDER — METOPROLOL SUCCINATE ER 25 MG PO TB24: 12.5000 mg | ORAL_TABLET | Freq: Every day | ORAL | 1 refills | Status: AC

## 2018-03-19 ENCOUNTER — Encounter: Payer: Medicare HMO | Admitting: *Deleted

## 2018-03-19 DIAGNOSIS — E039 Hypothyroidism, unspecified: Secondary | ICD-10-CM | POA: Diagnosis not present

## 2018-03-19 DIAGNOSIS — G311 Senile degeneration of brain, not elsewhere classified: Secondary | ICD-10-CM | POA: Diagnosis not present

## 2018-03-19 DIAGNOSIS — I639 Cerebral infarction, unspecified: Secondary | ICD-10-CM | POA: Diagnosis not present

## 2018-03-19 DIAGNOSIS — E118 Type 2 diabetes mellitus with unspecified complications: Secondary | ICD-10-CM | POA: Diagnosis not present

## 2018-03-19 DIAGNOSIS — R531 Weakness: Secondary | ICD-10-CM | POA: Diagnosis not present

## 2018-03-20 DIAGNOSIS — G311 Senile degeneration of brain, not elsewhere classified: Secondary | ICD-10-CM | POA: Diagnosis not present

## 2018-03-20 DIAGNOSIS — R531 Weakness: Secondary | ICD-10-CM | POA: Diagnosis not present

## 2018-03-20 DIAGNOSIS — E118 Type 2 diabetes mellitus with unspecified complications: Secondary | ICD-10-CM | POA: Diagnosis not present

## 2018-03-20 DIAGNOSIS — E039 Hypothyroidism, unspecified: Secondary | ICD-10-CM | POA: Diagnosis not present

## 2018-03-20 DIAGNOSIS — I639 Cerebral infarction, unspecified: Secondary | ICD-10-CM | POA: Diagnosis not present

## 2018-03-24 DIAGNOSIS — I639 Cerebral infarction, unspecified: Secondary | ICD-10-CM | POA: Diagnosis not present

## 2018-03-24 DIAGNOSIS — R531 Weakness: Secondary | ICD-10-CM | POA: Diagnosis not present

## 2018-03-24 DIAGNOSIS — G311 Senile degeneration of brain, not elsewhere classified: Secondary | ICD-10-CM | POA: Diagnosis not present

## 2018-03-24 DIAGNOSIS — E039 Hypothyroidism, unspecified: Secondary | ICD-10-CM | POA: Diagnosis not present

## 2018-03-24 DIAGNOSIS — E118 Type 2 diabetes mellitus with unspecified complications: Secondary | ICD-10-CM | POA: Diagnosis not present

## 2018-03-31 DIAGNOSIS — E039 Hypothyroidism, unspecified: Secondary | ICD-10-CM | POA: Diagnosis not present

## 2018-03-31 DIAGNOSIS — G311 Senile degeneration of brain, not elsewhere classified: Secondary | ICD-10-CM | POA: Diagnosis not present

## 2018-03-31 DIAGNOSIS — R531 Weakness: Secondary | ICD-10-CM | POA: Diagnosis not present

## 2018-03-31 DIAGNOSIS — I639 Cerebral infarction, unspecified: Secondary | ICD-10-CM | POA: Diagnosis not present

## 2018-03-31 DIAGNOSIS — E118 Type 2 diabetes mellitus with unspecified complications: Secondary | ICD-10-CM | POA: Diagnosis not present

## 2018-04-02 ENCOUNTER — Other Ambulatory Visit: Payer: Self-pay | Admitting: *Deleted

## 2018-04-02 MED ORDER — NYSTATIN 100000 UNIT/GM EX POWD: Freq: Four times a day (QID) | CUTANEOUS | 2 refills | Status: AC

## 2018-04-03 ENCOUNTER — Telehealth: Payer: Self-pay | Admitting: *Deleted

## 2018-04-03 DIAGNOSIS — E039 Hypothyroidism, unspecified: Secondary | ICD-10-CM | POA: Diagnosis not present

## 2018-04-03 DIAGNOSIS — R531 Weakness: Secondary | ICD-10-CM | POA: Diagnosis not present

## 2018-04-03 DIAGNOSIS — I639 Cerebral infarction, unspecified: Secondary | ICD-10-CM | POA: Diagnosis not present

## 2018-04-03 DIAGNOSIS — E118 Type 2 diabetes mellitus with unspecified complications: Secondary | ICD-10-CM | POA: Diagnosis not present

## 2018-04-03 DIAGNOSIS — G311 Senile degeneration of brain, not elsewhere classified: Secondary | ICD-10-CM | POA: Diagnosis not present

## 2018-04-03 NOTE — Telephone Encounter (Signed)
Called and aware - spoke with [facility] with Hospice

## 2018-04-03 NOTE — Telephone Encounter (Signed)
TC from [facility] w/ Hospice In visiting pt today, daughter stated she was having a hard time swallowing Nurse requesting to stop Simvastatin & Oxybutynin Please advise and call Beth back

## 2018-04-03 NOTE — Telephone Encounter (Signed)
Ok to discontinue 

## 2018-04-06 ENCOUNTER — Other Ambulatory Visit: Payer: Self-pay

## 2018-04-06 DIAGNOSIS — F0391 Unspecified dementia with behavioral disturbance: Secondary | ICD-10-CM

## 2018-04-06 DIAGNOSIS — F03B18 Unspecified dementia, moderate, with other behavioral disturbance: Secondary | ICD-10-CM

## 2018-04-06 MED ORDER — DIVALPROEX SODIUM ER 250 MG PO TB24: ORAL_TABLET | ORAL | 0 refills | Status: AC

## 2018-04-06 NOTE — Telephone Encounter (Signed)
Seen 9/19  Dr Reece Agar   This patient on Hospice  Having hard time taking Depakote pill   Can you change to spinkles?

## 2018-04-07 DIAGNOSIS — R531 Weakness: Secondary | ICD-10-CM | POA: Diagnosis not present

## 2018-04-07 DIAGNOSIS — E118 Type 2 diabetes mellitus with unspecified complications: Secondary | ICD-10-CM | POA: Diagnosis not present

## 2018-04-07 DIAGNOSIS — G311 Senile degeneration of brain, not elsewhere classified: Secondary | ICD-10-CM | POA: Diagnosis not present

## 2018-04-07 DIAGNOSIS — E039 Hypothyroidism, unspecified: Secondary | ICD-10-CM | POA: Diagnosis not present

## 2018-04-07 DIAGNOSIS — I639 Cerebral infarction, unspecified: Secondary | ICD-10-CM | POA: Diagnosis not present

## 2018-04-09 ENCOUNTER — Ambulatory Visit (INDEPENDENT_AMBULATORY_CARE_PROVIDER_SITE_OTHER): Payer: Medicare Other

## 2018-04-09 DIAGNOSIS — E118 Type 2 diabetes mellitus with unspecified complications: Secondary | ICD-10-CM

## 2018-04-09 DIAGNOSIS — E039 Hypothyroidism, unspecified: Secondary | ICD-10-CM

## 2018-04-09 DIAGNOSIS — I6930 Unspecified sequelae of cerebral infarction: Secondary | ICD-10-CM

## 2018-04-09 DIAGNOSIS — R531 Weakness: Secondary | ICD-10-CM

## 2018-04-09 DIAGNOSIS — G311 Senile degeneration of brain, not elsewhere classified: Secondary | ICD-10-CM | POA: Diagnosis not present

## 2018-04-09 DIAGNOSIS — I639 Cerebral infarction, unspecified: Secondary | ICD-10-CM | POA: Diagnosis not present

## 2018-04-10 DIAGNOSIS — G311 Senile degeneration of brain, not elsewhere classified: Secondary | ICD-10-CM | POA: Diagnosis not present

## 2018-04-10 DIAGNOSIS — R531 Weakness: Secondary | ICD-10-CM | POA: Diagnosis not present

## 2018-04-10 DIAGNOSIS — E118 Type 2 diabetes mellitus with unspecified complications: Secondary | ICD-10-CM | POA: Diagnosis not present

## 2018-04-10 DIAGNOSIS — E039 Hypothyroidism, unspecified: Secondary | ICD-10-CM | POA: Diagnosis not present

## 2018-04-10 DIAGNOSIS — I639 Cerebral infarction, unspecified: Secondary | ICD-10-CM | POA: Diagnosis not present

## 2018-04-14 DIAGNOSIS — E118 Type 2 diabetes mellitus with unspecified complications: Secondary | ICD-10-CM | POA: Diagnosis not present

## 2018-04-14 DIAGNOSIS — R531 Weakness: Secondary | ICD-10-CM | POA: Diagnosis not present

## 2018-04-14 DIAGNOSIS — E039 Hypothyroidism, unspecified: Secondary | ICD-10-CM | POA: Diagnosis not present

## 2018-04-14 DIAGNOSIS — G311 Senile degeneration of brain, not elsewhere classified: Secondary | ICD-10-CM | POA: Diagnosis not present

## 2018-04-14 DIAGNOSIS — I639 Cerebral infarction, unspecified: Secondary | ICD-10-CM | POA: Diagnosis not present

## 2018-04-16 ENCOUNTER — Other Ambulatory Visit: Payer: Self-pay | Admitting: Pediatrics

## 2018-04-16 ENCOUNTER — Other Ambulatory Visit: Payer: Self-pay | Admitting: Family

## 2018-04-17 DIAGNOSIS — E039 Hypothyroidism, unspecified: Secondary | ICD-10-CM | POA: Diagnosis not present

## 2018-04-17 DIAGNOSIS — G311 Senile degeneration of brain, not elsewhere classified: Secondary | ICD-10-CM | POA: Diagnosis not present

## 2018-04-17 DIAGNOSIS — I639 Cerebral infarction, unspecified: Secondary | ICD-10-CM | POA: Diagnosis not present

## 2018-04-17 DIAGNOSIS — E118 Type 2 diabetes mellitus with unspecified complications: Secondary | ICD-10-CM | POA: Diagnosis not present

## 2018-04-17 DIAGNOSIS — R531 Weakness: Secondary | ICD-10-CM | POA: Diagnosis not present

## 2018-04-21 DIAGNOSIS — G311 Senile degeneration of brain, not elsewhere classified: Secondary | ICD-10-CM | POA: Diagnosis not present

## 2018-04-21 DIAGNOSIS — I639 Cerebral infarction, unspecified: Secondary | ICD-10-CM | POA: Diagnosis not present

## 2018-04-21 DIAGNOSIS — E039 Hypothyroidism, unspecified: Secondary | ICD-10-CM | POA: Diagnosis not present

## 2018-04-21 DIAGNOSIS — E118 Type 2 diabetes mellitus with unspecified complications: Secondary | ICD-10-CM | POA: Diagnosis not present

## 2018-04-21 DIAGNOSIS — R531 Weakness: Secondary | ICD-10-CM | POA: Diagnosis not present

## 2018-04-23 DIAGNOSIS — G311 Senile degeneration of brain, not elsewhere classified: Secondary | ICD-10-CM | POA: Diagnosis not present

## 2018-04-23 DIAGNOSIS — I639 Cerebral infarction, unspecified: Secondary | ICD-10-CM | POA: Diagnosis not present

## 2018-04-23 DIAGNOSIS — R531 Weakness: Secondary | ICD-10-CM | POA: Diagnosis not present

## 2018-04-23 DIAGNOSIS — E118 Type 2 diabetes mellitus with unspecified complications: Secondary | ICD-10-CM | POA: Diagnosis not present

## 2018-04-23 DIAGNOSIS — E039 Hypothyroidism, unspecified: Secondary | ICD-10-CM | POA: Diagnosis not present

## 2018-04-24 DIAGNOSIS — G311 Senile degeneration of brain, not elsewhere classified: Secondary | ICD-10-CM | POA: Diagnosis not present

## 2018-04-24 DIAGNOSIS — R531 Weakness: Secondary | ICD-10-CM | POA: Diagnosis not present

## 2018-04-24 DIAGNOSIS — E039 Hypothyroidism, unspecified: Secondary | ICD-10-CM | POA: Diagnosis not present

## 2018-04-24 DIAGNOSIS — I639 Cerebral infarction, unspecified: Secondary | ICD-10-CM | POA: Diagnosis not present

## 2018-04-24 DIAGNOSIS — E118 Type 2 diabetes mellitus with unspecified complications: Secondary | ICD-10-CM | POA: Diagnosis not present

## 2018-04-25 DIAGNOSIS — E039 Hypothyroidism, unspecified: Secondary | ICD-10-CM | POA: Diagnosis not present

## 2018-04-25 DIAGNOSIS — I639 Cerebral infarction, unspecified: Secondary | ICD-10-CM | POA: Diagnosis not present

## 2018-04-25 DIAGNOSIS — R531 Weakness: Secondary | ICD-10-CM | POA: Diagnosis not present

## 2018-04-25 DIAGNOSIS — G311 Senile degeneration of brain, not elsewhere classified: Secondary | ICD-10-CM | POA: Diagnosis not present

## 2018-04-25 DIAGNOSIS — E118 Type 2 diabetes mellitus with unspecified complications: Secondary | ICD-10-CM | POA: Diagnosis not present

## 2018-04-27 ENCOUNTER — Encounter: Payer: Medicare HMO | Admitting: *Deleted

## 2018-04-27 DIAGNOSIS — I639 Cerebral infarction, unspecified: Secondary | ICD-10-CM | POA: Diagnosis not present

## 2018-04-27 DIAGNOSIS — E118 Type 2 diabetes mellitus with unspecified complications: Secondary | ICD-10-CM | POA: Diagnosis not present

## 2018-04-27 DIAGNOSIS — R531 Weakness: Secondary | ICD-10-CM | POA: Diagnosis not present

## 2018-04-27 DIAGNOSIS — G311 Senile degeneration of brain, not elsewhere classified: Secondary | ICD-10-CM | POA: Diagnosis not present

## 2018-04-27 DIAGNOSIS — E039 Hypothyroidism, unspecified: Secondary | ICD-10-CM | POA: Diagnosis not present

## 2018-05-10 DEATH — deceased

## 2018-05-25 ENCOUNTER — Ambulatory Visit: Payer: Medicare HMO | Admitting: Family Medicine
